# Patient Record
Sex: Male | Born: 1935
Health system: Southern US, Community
[De-identification: ages and names within clinical notes are randomized; demographics above are authoritative.]

## PROBLEM LIST (undated history)

## (undated) DIAGNOSIS — I2510 Atherosclerotic heart disease of native coronary artery without angina pectoris: Secondary | ICD-10-CM

## (undated) DIAGNOSIS — E119 Type 2 diabetes mellitus without complications: Secondary | ICD-10-CM

## (undated) DIAGNOSIS — I10 Essential (primary) hypertension: Secondary | ICD-10-CM

## (undated) DIAGNOSIS — E039 Hypothyroidism, unspecified: Secondary | ICD-10-CM

## (undated) DIAGNOSIS — Z951 Presence of aortocoronary bypass graft: Secondary | ICD-10-CM

## (undated) DIAGNOSIS — I350 Nonrheumatic aortic (valve) stenosis: Secondary | ICD-10-CM

## (undated) DIAGNOSIS — J189 Pneumonia, unspecified organism: Secondary | ICD-10-CM

## (undated) DIAGNOSIS — R011 Cardiac murmur, unspecified: Secondary | ICD-10-CM

## (undated) DIAGNOSIS — E785 Hyperlipidemia, unspecified: Secondary | ICD-10-CM

## (undated) DIAGNOSIS — Z8719 Personal history of other diseases of the digestive system: Secondary | ICD-10-CM

## (undated) HISTORY — DX: Presence of aortocoronary bypass graft: Z95.1

## (undated) HISTORY — DX: Atherosclerotic heart disease of native coronary artery without angina pectoris: I25.10

---

## 2007-12-16 ENCOUNTER — Inpatient Hospital Stay (HOSPITAL_COMMUNITY): Admission: EM | Admit: 2007-12-16 | Discharge: 2007-12-22 | Payer: Self-pay | Admitting: Emergency Medicine

## 2007-12-17 ENCOUNTER — Encounter: Payer: Self-pay | Admitting: Surgery

## 2007-12-17 ENCOUNTER — Ambulatory Visit: Payer: Self-pay | Admitting: Surgery

## 2007-12-17 DIAGNOSIS — Z951 Presence of aortocoronary bypass graft: Secondary | ICD-10-CM

## 2007-12-17 HISTORY — DX: Presence of aortocoronary bypass graft: Z95.1

## 2007-12-17 HISTORY — PX: CORONARY ARTERY BYPASS GRAFT: SHX141

## 2008-01-11 ENCOUNTER — Ambulatory Visit: Payer: Self-pay | Admitting: Surgery

## 2008-01-11 ENCOUNTER — Encounter: Admission: RE | Admit: 2008-01-11 | Discharge: 2008-01-11 | Payer: Self-pay | Admitting: Surgery

## 2008-02-03 ENCOUNTER — Encounter (HOSPITAL_COMMUNITY): Admission: RE | Admit: 2008-02-03 | Discharge: 2008-05-03 | Payer: Self-pay | Admitting: Cardiology

## 2009-08-04 IMAGING — CR DG CHEST 2V
2 series · 2 of 2 positions shown · non-contrast
Comparison: Chest x-ray of 12/20/2007

CLINICAL DATA: Status post CABG 12/17/2007

CHEST - 2 VIEW

[w chest pa]
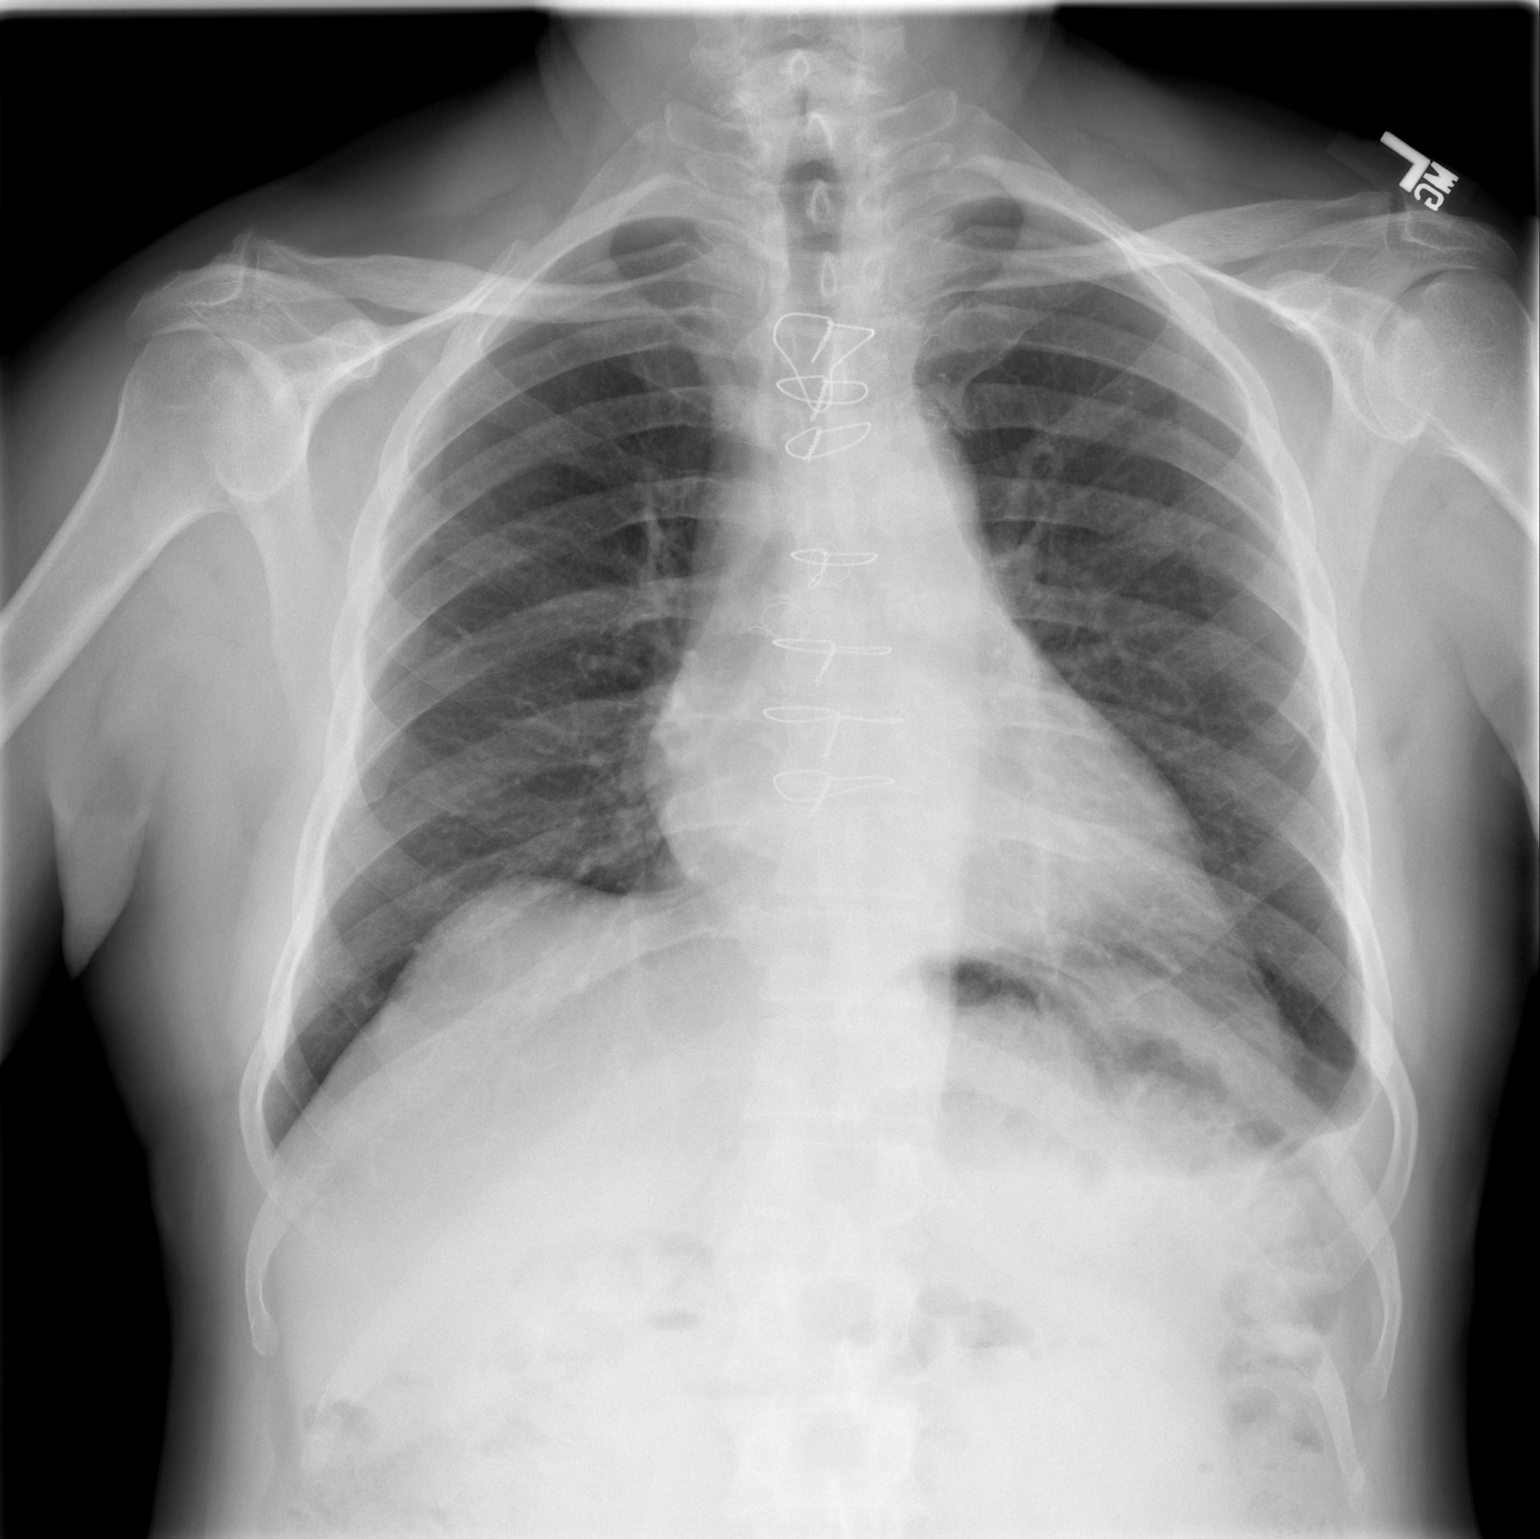

[w chest lat]
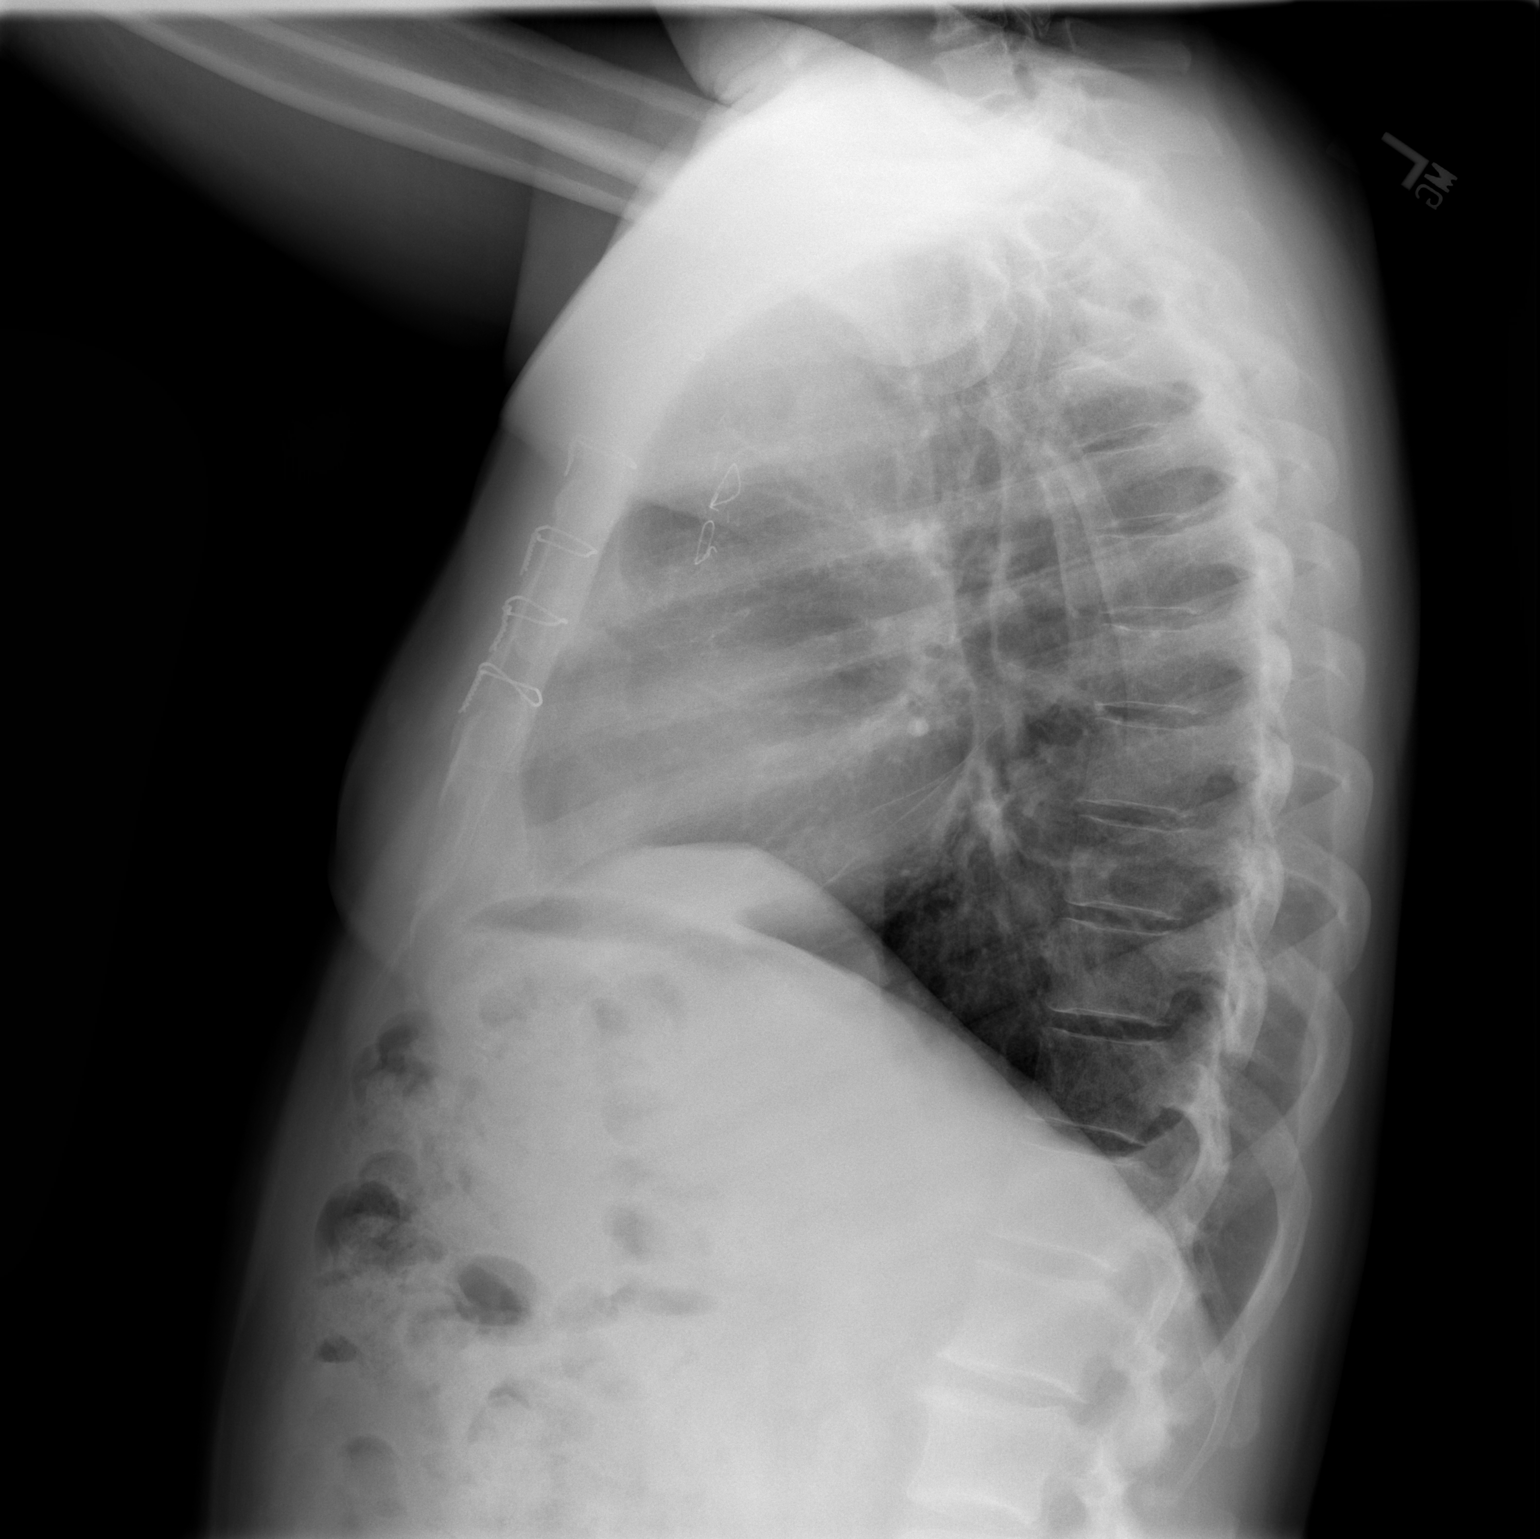

[2 of 2 positions shown; findings below may reference images not displayed]

FINDINGS: Aeration has improved significantly.  Only a tiny left
pleural effusion remains.  The heart is mildly enlarged and stable.
Median sternotomy sutures are noted.
IMPRESSION: Improved aeration with only tiny left pleural effusion remaining.

## 2010-06-11 NOTE — Cardiovascular Report (Signed)
NAME:  Dillon [name], Dillon [name] NO.:  [id number]   MEDICAL RECORD NO.:  [id number]           PATIENT TYPE:   LOCATION:                                 FACILITY:   PHYSICIAN:  Dillon [name], M.D.     DATE OF BIRTH:  08-Aug-1933   DATE OF PROCEDURE:  DATE OF DISCHARGE:                            CARDIAC CATHETERIZATION   INDICATIONS:  [name] is a 76 year old patient who has been  followed primarily by Dr. Nelson [name].  He recently has developed episodes of  exertional shortness of breath and some vague chest burning.  He  underwent a nuclear perfusion study yesterday which was high risk with  transient ST elevation inferiorly as well as ST segment depressions.  He  was admitted to [facility] by Dr. Garen [name] yesterday from the  office following his stress test.  Troponin was 0.85, CK 162, and MB 15.  Second troponin was 3.26.  The patient has been asymptomatic and had  received Lovenox last night.  He now is referred for definitive cardiac  catheterization.   PROCEDURE:  After premedication with Valium 5 mg intravenously, the  patient was prepped and draped in the usual fashion.  His right femoral  artery was punctured anteriorly and a 5-French sheath was inserted.  Diagnostic cardiac catheterization was done utilizing 5-French Judkins 4  left and right coronary catheters.  A 200 mcg IC nitroglycerin was also  administered down the right coronary artery.  The right catheter was  used for selective angiography in the left internal mammary artery  system due to need for CBG revascularization surgery.  A 5-French  pigtail catheter was used for biplane cine left ventriculography as well  as distal aortography.  Hemostasis was obtained by direct manual  pressure.  The patient tolerated the procedure well.   HEMODYNAMIC DATA:  Left central aortic pressure is 125/60, left  ventricular pressure 125/13, post A-wave 22.   ANGIOGRAPHIC DATA:  There was high-grade at least  90% ostial left main  stenosis followed by a concentric napkin ring stenosis of 60-70% in the  mid left main coronary artery prior to its bifurcation into the LAD and  left circumflex system.   The LAD gave rise to 1 major proximal diagonal vessel and septal  perforating artery and extended to and wrapped around slightly the LV  apex.   The circumflex vessel gave rise to 1 major marginal vessel which had  smooth narrowing of 20%.   There was calcification at the ostium of the right coronary artery, and  just below this level of calcification, there was 99% stenosis at the  ostium of the RCA.  After acute marginal branch, there was 70% narrowing  followed by 50% narrowing beyond the crux.   The left subclavian and internal mammary artery were normal and suitable  for CBG revascularization surgery.   Biplane cine left ventriculography revealed low normal LV function with  an ejection fraction estimate of approximately 50%.  On the RAO  projection, there appeared to be mild hypocontractility inferiorly.  Contractility was relatively normal on the  LAO projection, but there was  a subtle region of possible minimal inferolateral hypocontractility.   Distal aortography revealed normal aortoiliac system.  There was no  evidence for renal artery stenosis.   IMPRESSION:  1. Low normal LV function with mild inferolateral hypocontractility.  2. High-grade 90% ostial left main stenosis followed by concentric      napkin ring 60-70% mid left main stenosis and 99% ostial stenosis      in the right coronary artery with 70 and 50% narrowings in the      region just before and beyond the crux.   RECOMMENDATIONS:  Surgical consultation will be obtained immediately  following the catheterization.  The patient will resume anticoagulation  and continue on the anticoagulation until he scheduled for CBG  revascularization surgery.           ______________________________  Dillon [name],  M.D.     TK/MEDQ  D:  12/17/2007  T:  12/18/2007  Job:  [id number]   cc:   Dillon Mends, MD  Dr. Nelson [name]

## 2010-06-11 NOTE — Discharge Summary (Signed)
NAME:  Dillon [name], Dillon [name] NO.:  [id number]   MEDICAL RECORD NO.:  [id number]         PATIENT TYPE:  CINP   LOCATION:                               FACILITY:  [facility]   PHYSICIAN:  Evelene Croon, M.D.     DATE OF BIRTH:  01-12-1934   DATE OF ADMISSION:  12/16/2007  DATE OF DISCHARGE:                               DISCHARGE SUMMARY   FINAL DIAGNOSIS:  High-grade ostial left main and ostial right coronary  stenosis.   IN-HOSPITAL DIAGNOSES:  1. Acute blood loss anemia, postoperatively.  2. Volume overload, postoperatively.   SECONDARY DIAGNOSES:  1. Hypertension.  2. Dyslipidemia.  3. Hypothyroidism.   IN-HOSPITAL OPERATIONS AND PROCEDURES:  1. Cardiac catheterization.  2. Emergent coronary artery bypass grafting x3 using left internal      mammary artery graft to left anterior descending coronary artery,      saphenous vein graft to obtuse marginal branch of the left      circumflex coronary artery, saphenous vein graft to right coronary      artery.  Endoscopic vein harvesting from right leg.   PATIENT'S HISTORY AND PHYSICAL AND HOSPITAL COURSE:  The patient is a 77-  year-old gentleman who presents with a history of right-sided chest  tightness and progressive exertional dyspnea.  He had a markedly  abnormal stress test yesterday and was admitted and ruled in for small  non-ST-segment elevation myocardial infarction.  Cardiac catheterization  done, showed 95% ostial left main stenosis as well as some elevated  plaque in the left main coronary artery.  There was about 99% ostial  right coronary artery stenosis.  Left ventricular ejection fraction  about 50%.  Following catheterization, Dr. Laneta Simmers was consulted for  emergent coronary artery bypass grafting.  Dr. Laneta Simmers discussed with the  family and wife.  He discussed undergoing emergent coronary artery  bypass grafting.  Risks and benefits discussed.  The patient voiced  understanding and agreed to proceed.   Surgery was scheduled for a day,  December 17, 2007.   For details of the patient's past medical history and physical exam,  please see dictated H&P.   The patient was taken to the operating room on December 17, 2007, where  he underwent emergent coronary artery bypass grafting x3 using a left  internal mammary artery graft to the left anterior descending coronary  artery, saphenous vein graft to obtuse marginal branch of the left  circumflex coronary artery, saphenous vein graft to right coronary  artery.  Endoscopic vein harvesting from the right leg was done.  The  patient tolerated this procedure well and was transferred to the  intensive care unit in stable condition.  Postoperatively, the patient  was noted to be hemodynamically stable.  He was extubated the morning  following surgery.  Post extubation, the patient noted to be alert and  oriented x4 and neuro intact.  Postoperatively, the patient did have  some vasodilation.  He was placed on Levophed.  He did have some mild  acute blood loss initially, on postop day 1, 8.6 and 24.9%.  The patient  was transfused with 1 unit of packed red blood cells.  The Levophed was  able to be weaned and discontinued.  The patient's heart rate and blood  pressure remained stable.  His vasodilation improved.  The patient's  hemoglobin/hematocrit improved with a unit of blood to 9.5 and 27.2%.  On postop day 2, chest x-ray remained stable with minimal drainage from  chest tube.  The patient was up and out of bed.  Chest tubes were able  to be discontinued in normal fashion.  The patient did have some mild  volume overload postoperatively and was started on diuretics.  By postop  day 3, the patient continued to progress well.  He was felt to be stable  and ready for transfer to 2000.  The patient's hemoglobin and hematocrit  were stable at 8.8 and 25.8% on postop day 3.  Postoperatively, cardiac  rehab continued to work with the patient.  He was  up ambulating well  without difficulty.  His heart rate remained in normal sinus rhythm.  Blood pressure remained stable.  He was able to be started on low-dose  beta blocker and tolerated well.  The patient was encouraged to continue  using his incentive spirometer.  He was able to be weaned off oxygen  sating greater than 90% on room air.  The patient remained on Lasix for  his volume overload.  Daily weights were obtained.  He remained 2.2 kg  above his preoperative weight.  The patient did have some mild nausea  postoperatively with 1 episode of vomiting on postop day 3.  This  resolved.  The patient did have a bowel movement following surgery.  Prior to discharge, the patient was tolerating diet well with no nausea  or vomiting.  All incisions were noted to be clean, dry, and intact and  healing well.   Postop day 4, the patient's vital signs were stable.  He was afebrile.  Labs showed a sodium of 135, potassium 5.3, chloride of 104, bicarb of  28, BUN of 16, creatinine of 1.06, glucose of 116.  White blood cell  count 10.4, hemoglobin of 8.8, hematocrit 25.8, platelet count 137.  The  patient is felt to be stable and ready for discharge home in the a.m. on  postop day 5, December 22, 2007.   FOLLOWUP APPOINTMENTS:  A followup appointment was arranged with Dr.  Laneta Simmers for January 11, 2008, at 2:30 p.m.  The patient will need to  obtain PA and lateral chest x-ray 30 minutes prior to this appointment.  The patient will need to follow up with Dr. Tresa Endo in 2 weeks.  He will  need to contact the office to make these arrangements.   ACTIVITY:  The patient was instructed no driving until released to do so  and no lifting over 10 pounds.  He is told to ambulate 3-4 times a day,  progress as tolerated and continue his breathing exercises.   INCISIONAL CARE:  The patient is told to shower, washing his incisions  using soap and water.  He is to contact the office if he develops any   drainage or opening from any of his incision sites.   DIET:  The patient was educated on diet to be low fat, low salt.   DISCHARGE MEDICATIONS:  1. Levoxyl 112 mcg daily.  2. Crestor 10 mg daily.  3. Fexofenadine 180 mg daily.  4. Ald[place] 5% cream apply as needed.  5. Aspirin 325 mg daily.  6. Niacin  500 mg daily.  7. Vitamin D daily.  8. Toprol-XL 25 mg daily.  9. Lasix 40 mg daily x3 days.  10.Oxycodone 5 mg 1-2 tablets q.4-6 h. p.r.n.      Theda Belfast, [place]      Evelene Croon, M.D.  Electronically Signed    KMD/MEDQ  D:  12/21/2007  T:  12/21/2007  Job:  [id number]   cc:   Dr. Tresa Endo

## 2010-06-11 NOTE — Op Note (Signed)
NAME:  Dillon [name], Dillon [name] NO.:  [id number]   MEDICAL RECORD NO.:  [id number]          PATIENT TYPE:  INP   LOCATION:  2308                         FACILITY:  [facility]   PHYSICIAN:  Evelene Croon, M.D.     DATE OF BIRTH:  13-Jul-1933   DATE OF PROCEDURE:  12/17/2007  DATE OF DISCHARGE:                               OPERATIVE REPORT   PREOPERATIVE DIAGNOSES:  High-grade ostial left main and ostial right  coronary stenosis.   POSTOPERATIVE DIAGNOSES:  High-grade ostial left main and ostial right  coronary stenosis.   OPERATIVE PROCEDURE:  Emergent median sternotomy, extracorporeal  circulation, coronary bypass graft surgery x3 using a left internal  mammary artery graft to the left anterior descending coronary artery,  with a saphenous vein graft to the obtuse marginal branch of the left  circumflex coronary artery, and a saphenous vein graft to the right  coronary artery.  Endoscopic vein harvesting from the right leg.   SURGEON:  Evelene Croon, M.D.   ASSISTANT:  Coral Ceo, PA-C.   ANESTHESIA:  General endotracheal.   CLINICAL HISTORY:  This patient is a 77 year old gentleman who presented  with a history of right-sided chest tightness and progressive exertional  dyspnea.  He had a markedly abnormal stress test yesterday and was  admitted and ruled in for a small non ST-segment elevation MI.  Cardiac  catheterization was done today and showed 95% ostial left main stenosis  as well as some elevated plaque in the left main coronary artery.  There  was about 99% ostial right coronary stenosis.  The right coronary was  diffusely diseased throughout the proximal and midportions.  Left  ventricular ejection fraction about 50%.  After review of the  catheterization, I felt that it would be best to proceed with emergent  coronary bypass surgery, given his high risk anatomy.  I discussed the  operative procedure with the patient and his wife including  alternatives,  benefits, and risks including but not limited to bleeding,  blood transfusion, infection, stroke, myocardial infarction, graft  failure, and death.  He understood and agreed to proceed.   OPERATIVE PROCEDURE:  The patient was taken to the operating room and  placed on table in supine position.  After induction of general  endotracheal anesthesia, a Foley catheter was placed in bladder using  sterile technique.  Then, the chest, abdomen, and both lower extremities  were prepped and draped in usual sterile manner.  The chest is entered  through a median sternotomy incision and the pericardium was opened in  the midline.  Examination of the heart showed good ventricular  contractility.  The ascending aorta had no palpable plaques in it.   Then, the left internal mammary artery was harvested from the chest wall  using pedicle graft.  This is a medium caliber vessel with excellent  blood flow through it.  At the same time, a segment of greater saphenous  vein was harvested from the right leg using endoscopic vein harvest  technique.  The vein was of medium size and good quality.   Then, the  patient was heparinized when an adequate activated clotting  time was achieved, the distal ascending aorta was cannulated using a 20-  [place] aortic cannula for arterial inflow.  Venous outflow was achieved  using a 2-stage venous cannula to the right atrial appendage.  An  antegrade cardioplegia and vent cannula was inserted in the aortic root.   The patient placed on cardiopulmonary bypass and distal coronaries  identified.  The LAD was a large graftable vessel.  The left circumflex  had a small intermediate vessel and a large distal obtuse marginal  branch which was graftable.  The right coronary artery was heavily  diseased in its proximal and midportion, but distally beyond the acute  margin of the heart, the vessel was minimally diseased and suitable for  grafting.  Then, the aorta was cross  clamped and 1000 mL of cold blood  antegrade cardioplegia was administered in the aortic root with quick  arrest of the heart.  Systemic hypothermia to 32 degrees centigrade and  topical hypothermic with iced saline was used.  An insulating pad was  placed in the pericardium.  Temperature probe was placed in the septum.   The first distal anastomosis was performed to the obtuse marginal  branch.  The internal diameter was about 1.75 mm.  The conduit used was  a segment of greater saphenous vein.  The anastomosis performed in end-  to-side manner using continuous 7-0 Prolene suture.  The flow was noted  through the graft and was excellent.   A second distal anastomosis was performed to the distal right coronary  artery.  The internal diameter was about 2 mm.  The conduit used was a  second segment of greater saphenous vein and the anastomosis performed  in end-to-side manner using continuous 7-0 Prolene suture.  The flow was  noted through the graft and was excellent.  Another dose of cardioplegia  was given down the vein grafts and in the aortic root.   The third distal anastomosis was performed to the midportion of the left  anterior descending coronary artery.  The internal diameter was about 2  mm.  The conduit used was a left internal mammary graft and this was  brought through an opening left pericardium, anterior to phrenic nerve.  It was anastomosed to the LAD in end-to-side manner using continuous 8-0  Prolene suture.  The pedicle was sutured to the epicardium, 6-0 Prolene  sutures.  The patient rewarmed to 37 degrees centigrade.  With cross-  clamp in place, the 2 proximal vein graft anastomoses were performed to  the aortic root in end-to-side manner using continuous 6-0 Prolene  suture.  Then, the clamp was removed from mammary pedicle.  There was  rapid warming of the ventricular septum and return of spontaneous  ventricular fibrillation.  The cross-clamp was removed with a  time of 56  minutes and the patient spontaneously converted to sinus rhythm.  The  proximal and distal anastomoses appeared hemostatic and the graft was  satisfactory.  Graft mark was placed around the proximal anastomoses.  Two temporary right ventricular and right atrial pacing wires placed and  brought through the skin.   The patient rewarmed to 37 degrees centigrade, he was weaned from  cardiopulmonary bypass on no inotropic agents.  Total bypass time was 73  minutes.  Cardiac function appeared excellent.  Cardiac output of 5  liters per minute.  Protamine was given and the venous and aortic  cannulas removed without difficulty.  Hemostasis was  achieved.  Three  chest tubes were placed with the tube in the posterior pericardium, one  in the left pleural space and one in the anterior mediastinum.  The  sternum was then closed with #6 stainless steel wires.  The fascia was  closed with continuous #1 Vicryl suture.  Subcutaneous tissue was closed  with continuous 2-0 Vicryl and the skin with 3-0 Vicryl subcuticular  closure.  The lower extremity vein harvest site was closed in layers in  similar manner.  The sponge, needle and counts were correct  according to the scrub nurse.  Dry sterile dressing applied over the  incisions around the chest tubes with Pleur-evac suction.  The patient  remained hemodynamically stable and was transported to the SICU in  guarded but stable condition.     Evelene Croon, M.D.  Electronically Signed    BB/MEDQ  D:  12/17/2007  T:  12/18/2007  Job:  [id number]   cc:   Nicki Guadal[place], M.D.

## 2010-06-11 NOTE — Assessment & Plan Note (Signed)
OFFICE VISIT   Dillon [name], Dillon [name]  DOB:  1933-04-17                                        January 11, 2008  CHART #:  [id number]   The patient returned today for followup, status post emergent coronary  artery bypass graft surgery x3 on December 17, 2007, for a 95% ostial  left main stenosis and 99% ostial right coronary stenosis.  He has been  feeling well since discharge.  He is walking in his house for 20 minutes  at a time without any chest pain or shortness of breath.  He has not  been getting out much due to the weather.  He has mild chest wall  soreness, but has had no anginal chest pain.   PHYSICAL EXAMINATION:  VITAL SIGNS:  Blood pressure 135/77, pulse is 77  and regular.  Respiratory rate is 18 and unlabored.  Oxygen saturation  on room air is 99%.  GENERAL:  He looks well.  CARDIAC:  Regular rate and rhythm with normal heart sounds.  LUNG:  Clear.  CHEST:  The chest incision is healing well and sternum is stable.  EXTREMITIES:  His leg incision is healing well and there is no  significant peripheral edema.   Followup chest x-ray today shows clear lung fields and no pleural  effusions.   His medications are Cozaar 100 mg daily, fexofenadine 180 mg daily,  Levoxyl 100 mcg daily, Crestor 10 mg daily, Lopressor 25 mg daily,  aspirin 325 mg daily, niacin 500 mg daily, and vitamin D 2000 units  daily.   IMPRESSION:  Overall, the patient is recovering well following a  surgery.  I encouraged him to continue walking as much as possible.  He  is planning on starting a cardiac rehab program after the holidays.  I  told him that he could return to driving a car at this time, he should  refrain from lifting anything heavier than 10 pounds for a total of 3  months from the date of surgery.  He has  a followup appointment with Dr. Tresa Endo later this week and will contact  me if he develops any problems with his incisions.   Evelene Croon, M.D.  Electronically Signed   BB/MEDQ  D:  01/11/2008  T:  01/11/2008  Job:  [id number]   cc:   Nicki Gu[place], M.D.

## 2010-06-11 NOTE — Consult Note (Signed)
NAME:  Dillon [name], Dillon [name] NO.:  [id number]   MEDICAL RECORD NO.:  [id number]          PATIENT TYPE:  INP   LOCATION:  2308                         FACILITY:  [facility]   PHYSICIAN:  Evelene Croon, M.D.     DATE OF BIRTH:  Dec 21, 1933   DATE OF CONSULTATION:  12/17/2007  DATE OF DISCHARGE:                                 CONSULTATION   REFERRING PHYSICIAN:  Nicki Guadalajara, M.D.   REASON FOR CONSULTATION:  High-grade left main and ostial right coronary  artery stenosis.   CLINICAL HISTORY:  I was asked by Dr. Nicki Guadalajara to evaluate Dillon [name]  for consideration of coronary artery bypass graft surgery.  He is a 77-year-old gentleman with a history of hypertension, dyslipidemia, and  hypothyroidism, who has no prior cardiac history, but does report a  several-month history of right-sided chest tightness and exertional  dyspnea.  The symptoms started in September 2009 and have progressively  worsened.  He has also noticed some weight gain and abdominal  distention.  He underwent a stress test yesterday which was markedly  positive with development of chest pain and EKG changes as well as  shortness of breath.  He was admitted and ruled in for a small non-ST  segment elevation MI.  He was taken to the cath lab this morning by Dr.  Tresa Endo and this showed 90-95% ostial left main stenosis as well as a 99%  ostial right coronary artery stenosis with diffuse disease throughout  the proximal and mid right.  Left ventricular ejection fraction is about  50%.   His review of systems is as follows:  GENERAL:  He denies any fever or chills.  He has had some recent weight  gain.  Does report fatigue and generalized weakness.  EYES:  Negative.  ENT:  Negative.  ENDOCRINE:  He denies diabetes.  He does have  hypothyroidism.  CARDIOVASCULAR:  As above.  He denies PND and  orthopnea.  He denies palpitations.  He denies peripheral edema.  RESPIRATORY:  Denies cough and sputum production.  He  has had exertional  dyspnea.  GI:  He has had no nausea and vomiting.  He denies melena and  bright red blood per rectum.  GU:  He denies dysuria and hematuria.  MUSCULOSKELETAL:  He denies arthralgias and myalgias.  NEUROLOGICAL:  He  denies any focal weakness or numbness.  He denies dizziness and syncope.  He has never had a TIA or stroke.  PSYCHIATRIC:  Negative.  HEMATOLOGICAL:  Negative.   ALLERGIES:  None.   PAST MEDICAL HISTORY:  Significant for hypertension, dyslipidemia, and  hypothyroidism.  He has never been hospitalized and has had no surgery.   MEDICATIONS:  As noted in the medication administration record.   SOCIAL HISTORY:  He is married, but has no children.  He does not smoke,  but did in the past.  He denies alcohol use.   FAMILY HISTORY:  His mother died at age 70 with diabetes and kidney  disease.  His father died at age 55 with complications of myocardial  infarction.  He has 2 sisters that both have hypertension and had  strokes.   PHYSICAL EXAMINATION:  GENERAL:  He is a well-developed white male in no  distress.  VITAL SIGNS:  Blood pressure is 145/90.  Pulse is 80 and regular.  Respiratory rate is 18 and unlabored.  HEENT:  Normocephalic and atraumatic.  Pupils are equal and reactive to  light and accommodation.  Extraocular muscles are intact.  His throat is  clear.  NECK:  Normal carotid pulses bilaterally.  There are no bruits.  There  is no adenopathy or thyromegaly.  CARDIAC:  Regular rate and rhythm with normal S1 and S2.  There is no  murmur, rub, or gallop.  LUNGS:  Clear.  ABDOMINAL:  Active bowel sounds.  His abdomen is soft and nontender.  There are no palpable masses or organomegaly.  EXTREMITIES:  No peripheral edema.  Pedal pulses are palpable  bilaterally.  SKIN:  Warm and dry.  NEUROLOGIC:  Alert and oriented x3.  Motor and sensory exams grossly  normal.   IMPRESSION:  Dillon [name] has high-grade left main and ostial right  coronary  artery stenosis with a markedly abnormal stress test.  Given  this degree of high risk anatomy, I think we should proceed with  emergent coronary artery bypass surgery.  I discussed the operative  procedure with the patient and his wife including alternatives,  benefits, and risks including but not limited to bleeding, blood  transfusion, infection, stroke, myocardial infarction, graft failure,  and death.  He understands and agrees to proceed.      Evelene Croon, M.D.  Electronically Signed     BB/MEDQ  D:  12/17/2007  T:  12/18/2007  Job:  [id number]   cc:   Nicki Guadalajara, M.D.

## 2010-06-11 NOTE — H&P (Signed)
NAME:  Dillon [name], Dillon [name] NO.:  [id number]   MEDICAL RECORD NO.:  [id number]          [name] TYPE:  INP   LOCATION:  3714                         FACILITY:  [facility]   PHYSICIAN:  Sheliah Mends, MD      DATE OF BIRTH:  1933/07/26   DATE OF ADMISSION:  12/16/2007  DATE OF DISCHARGE:                              HISTORY & PHYSICAL   CHIEF COMPLAINT:  Dyspnea on exertion and chest discomfort.   HISTORY OF PRESENT ILLNESS:  Dillon [name] is a 77 year old gentleman  who presented with complaints of shortness of breath and dyspnea on  exertion on December 11, 2007, to [facility] for evaluation.  He noticed these symptoms first on a golf trip  to [place] in September 2009.  His symptoms subsequently  worsened.  In addition, he noted significant weight gain and fluid  distention in his abdomen.  Dillon [name] did not have any overt chest pain  or chest pressure.  He denied associated symptoms such as nausea and  left arm numbness.   Dillon [name] denied history of prior myocardial infarction.  He has  multiple risk factors for coronary artery disease, most notably his age,  history of dyslipidemia, and hypertension.  Dillon [name] was subsequently  scheduled for a transthoracic echocardiogram and cardiac stress test.  He underwent a treadmill myocardial perfusion scan.  During exercise, he  develops a dynamic EKG changes with ST elevation as well as ST  depressions during Dillon end of exercise into recovery.  His EKG changes  resolved subsequently.  During this episode he had a right-sided chest  discomfort and felt sudden onset of fatigue, but no overt chest pain.  Dillon results of his transthoracic echocardiogram are currently pending.  Dillon myocardial perfusion images showed evidence of an inferior scar.  Final reading of both tests are currently pending.   PAST MEDICAL HISTORY:  1. Hypertension.  2. Dyslipidemia.  3. Hypothyroidism.   MEDICATIONS:  1. Levoxyl 112 mcg p.o. daily.  2. Crestor 10 mg p.o. nightly.  3. Fexofenadine 180 mg p.o. daily.  4. Topical ointment for treatment for skin infection on top of his      head.  5. Tekturna 150 mg p.o. daily.  6. Aspirin 325 p.o. daily.  7. Niacin 500 mg p.o. daily.  8. Vitamin D supplement.   ALLERGIES:  No known drug allergies.   SOCIAL HISTORY:  Dillon [name] is retired.  He is married and has no  children.  He does exercise on a  regular basis.  He has no history of  tobacco, alcohol, and IV drug abuse.   FAMILY HISTORY:  Dillon [name]'s mother died at age 59 of complications  from diabetes and kidney disease.  Dillon [name]'s father died at age 65  of complications from myocardial infarction.  Dillon [name] has 2 sisters  and both of them have a history of hypertension and stroke.   REVIEW OF SYSTEMS:  Positive as above including fatigue, internal  weakness, dyspnea on exertion, weight gain, and occasional chest  discomfort.  Dillon [name] denies headaches, nausea, vomiting, chest pain,  and lower extremity edema.   PHYSICAL EXAMINATION:  GENERAL:  Dillon [name] is alert and oriented x3.  VITAL SIGNS:  Blood pressure 140/85, heart rate 80, weight 166 pounds.  NECK:  Supple.  Normal JVP.  No carotid bruit.  CHEST:  Lung, clear to auscultation bilaterally.  No rales or wheezes.  HEART:  Regular rate and rhythm.  No rub, murmur, or gallop.  ABDOMEN:  Soft, nontender, and nondistended.  Positive bowel sounds.  EXTREMITIES:  No edema.   LABORATORY DATA:  Total cholesterol 147, triglycerides 149, HDL 45, and  LDL 74.  Admission labs including cardiac enzymes are currently pending  and EKG from December 01, 2007, shows normal sinus rhythm at a rate of 64  beats per minute, normal axis, and no significant ST-T-segment changes.   IMPRESSION:  1. High risk myocardial perfusion scan with dynamic EKG changes      including ST elevation that are currently resolved.  2. Acute  coronary syndrome.  3. Hypertension.  4. Dyslipidemia.   PLAN:  Mr. Dwon Sky will be admitted to [facility] to [place]  telemetry unit.  He will be followed with serial EKG and cardiac  enzymes.  He will be scheduled for cardiac catheterization within 24  hours.   In Dillon meantime, Mr. Venson will be continued on Tekturna and  hydrochlorothiazide for control of his blood pressure as well as Crestor  for dyslipidemia and I will add Lovenox 1 mg/kg Platea q.12 h. to his  regimen.  Mr. Sens will be admitted as a full code.  He agreed to  proceed with coronary angiography.  I discussed Dillon nature of Dillon  procedure including benefits and complications with him.   Mr. Renault will be admitted as a full code.  Thank you for allowing me to  assist in Dillon care of this nice gentleman.      Sheliah Mends, MD  Electronically Signed     JE/MEDQ  D:  12/16/2007  T:  12/17/2007  Job:  [id number]

## 2010-10-30 LAB — CBC
HCT: 24.9 — ABNORMAL LOW
HCT: 25.8 — ABNORMAL LOW
HCT: 29.3 — ABNORMAL LOW
HCT: 33.1 — ABNORMAL LOW
HCT: 40.9
HCT: 41
Hemoglobin: 10.1 — ABNORMAL LOW
Hemoglobin: 11.1 — ABNORMAL LOW
Hemoglobin: 13.8
Hemoglobin: 8.8 — ABNORMAL LOW
Hemoglobin: 9.5 — ABNORMAL LOW
MCHC: 34.2
MCHC: 34.4
MCHC: 34.8
MCV: 94.6
MCV: 94.8
MCV: 96.6
MCV: 97
Platelets: 134 — ABNORMAL LOW
Platelets: 137 — ABNORMAL LOW
RBC: 2.58 — ABNORMAL LOW
RBC: 2.91 — ABNORMAL LOW
RBC: 3.09 — ABNORMAL LOW
RBC: 3.41 — ABNORMAL LOW
RBC: 4.22
RBC: 4.27
RDW: 12.3
RDW: 14.3
WBC: 10.4
WBC: 10.7 — ABNORMAL HIGH
WBC: 14.7 — ABNORMAL HIGH
WBC: 7.7

## 2010-10-30 LAB — POCT I-STAT 3, ART BLOOD GAS (G3+)
Bicarbonate: 22.1
Bicarbonate: 23.8
Bicarbonate: 25.2 — ABNORMAL HIGH
Bicarbonate: 25.6 — ABNORMAL HIGH
Patient temperature: 37.9
TCO2: 23
TCO2: 25
TCO2: 26
pCO2 arterial: 32.8 — ABNORMAL LOW
pCO2 arterial: 39.7
pCO2 arterial: 42.7
pH, Arterial: 7.34 — ABNORMAL LOW
pH, Arterial: 7.417
pH, Arterial: 7.464 — ABNORMAL HIGH
pO2, Arterial: 92

## 2010-10-30 LAB — CARDIAC PANEL(CRET KIN+CKTOT+MB+TROPI)
CK, MB: 16.8 — ABNORMAL HIGH
Relative Index: 8.9 — ABNORMAL HIGH
Total CK: 158
Total CK: 189
Troponin I: 3.26

## 2010-10-30 LAB — BASIC METABOLIC PANEL
BUN: 10
BUN: 15
BUN: 16
BUN: 21
CO2: 23
CO2: 25
CO2: 25
CO2: 28
Calcium: 7.9 — ABNORMAL LOW
Calcium: 8 — ABNORMAL LOW
Calcium: 8.5
Chloride: 100
Chloride: 104
Chloride: 107
Chloride: 107
Chloride: 93 — ABNORMAL LOW
Creatinine, Ser: 0.88
Creatinine, Ser: 1.06
GFR calc Af Amer: >60
GFR calc non Af Amer: 58 — ABNORMAL LOW
GFR calc non Af Amer: >60
GFR calc non Af Amer: >60
Glucose, Bld: 116 — ABNORMAL HIGH
Glucose, Bld: 126 — ABNORMAL HIGH
Glucose, Bld: 88
Potassium: 3.5
Potassium: 4.4
Sodium: 130 — ABNORMAL LOW
Sodium: 132 — ABNORMAL LOW
Sodium: 136

## 2010-10-30 LAB — POCT I-STAT 4, (NA,K, GLUC, HGB,HCT)
Glucose, Bld: 102 — ABNORMAL HIGH
Glucose, Bld: 88
Glucose, Bld: 90
Glucose, Bld: 92
Glucose, Bld: 99
HCT: 20 — ABNORMAL LOW
HCT: 23 — ABNORMAL LOW
HCT: 24 — ABNORMAL LOW
HCT: 33 — ABNORMAL LOW
HCT: 36 — ABNORMAL LOW
Hemoglobin: 11.6 — ABNORMAL LOW
Hemoglobin: 8.2 — ABNORMAL LOW
Potassium: 4.2
Potassium: 6.8
Potassium: 7
Sodium: 126 — ABNORMAL LOW
Sodium: 127 — ABNORMAL LOW
Sodium: 132 — ABNORMAL LOW
Sodium: 132 — ABNORMAL LOW

## 2010-10-30 LAB — CK TOTAL AND CKMB (NOT AT ARMC): Total CK: 163

## 2010-10-30 LAB — GLUCOSE, CAPILLARY
Glucose-Capillary: 116 — ABNORMAL HIGH
Glucose-Capillary: 125 — ABNORMAL HIGH
Glucose-Capillary: 126 — ABNORMAL HIGH
Glucose-Capillary: 142 — ABNORMAL HIGH
Glucose-Capillary: 144 — ABNORMAL HIGH
Glucose-Capillary: 86
Glucose-Capillary: 89

## 2010-10-30 LAB — POCT I-STAT, CHEM 8
BUN: 12
BUN: 12
Calcium, Ion: 1.15
Chloride: 98
Creatinine, Ser: 1.2
Glucose, Bld: 111 — ABNORMAL HIGH
Hemoglobin: 13.9
Hemoglobin: 9.5 — ABNORMAL LOW
Potassium: 4
Potassium: 4.2
Sodium: 138

## 2010-10-30 LAB — TYPE AND SCREEN: ABO/RH(D): O POS

## 2010-10-30 LAB — TROPONIN I: Troponin I: 0.85

## 2010-10-30 LAB — PROTIME-INR: Prothrombin Time: 13.5

## 2010-10-30 LAB — POCT I-STAT 3, VENOUS BLOOD GAS (G3P V)
Acid-base deficit: 2
Bicarbonate: 24.6 — ABNORMAL HIGH
O2 Saturation: 87
TCO2: 26
pH, Ven: 7.326 — ABNORMAL HIGH

## 2010-10-30 LAB — APTT: aPTT: 26

## 2010-10-30 LAB — HEMOGLOBIN AND HEMATOCRIT, BLOOD: HCT: 25.3 — ABNORMAL LOW

## 2011-12-11 ENCOUNTER — Other Ambulatory Visit (HOSPITAL_COMMUNITY): Payer: Self-pay | Admitting: Cardiovascular Disease

## 2011-12-11 DIAGNOSIS — I6529 Occlusion and stenosis of unspecified carotid artery: Secondary | ICD-10-CM

## 2011-12-18 ENCOUNTER — Ambulatory Visit (HOSPITAL_COMMUNITY)
Admission: RE | Admit: 2011-12-18 | Discharge: 2011-12-18 | Disposition: A | Discharge status: Home / Self Care | Payer: Medicare Other | Source: Ambulatory Visit | Attending: Cardiovascular Disease | Admitting: Cardiovascular Disease

## 2011-12-18 DIAGNOSIS — I6529 Occlusion and stenosis of unspecified carotid artery: Secondary | ICD-10-CM | POA: Insufficient documentation

## 2011-12-18 NOTE — Progress Notes (Signed)
Carotid duplex completed. 12/18/2011 Elvert Cumpton, Cletis [place]

## 2012-04-02 ENCOUNTER — Encounter: Payer: Self-pay | Admitting: *Deleted

## 2012-04-23 ENCOUNTER — Encounter: Payer: Self-pay | Admitting: Cardiovascular Disease

## 2012-07-05 ENCOUNTER — Ambulatory Visit (INDEPENDENT_AMBULATORY_CARE_PROVIDER_SITE_OTHER): Payer: Medicare Other | Admitting: Cardiovascular Disease

## 2012-07-05 ENCOUNTER — Encounter: Payer: Self-pay | Admitting: Cardiovascular Disease

## 2012-07-05 VITALS — BP 126/80 | HR 58 | Ht 65.0 in | Wt 169.4 lb

## 2012-07-05 DIAGNOSIS — I2510 Atherosclerotic heart disease of native coronary artery without angina pectoris: Secondary | ICD-10-CM

## 2012-07-05 DIAGNOSIS — E782 Mixed hyperlipidemia: Secondary | ICD-10-CM

## 2012-07-05 DIAGNOSIS — E039 Hypothyroidism, unspecified: Secondary | ICD-10-CM

## 2012-07-05 DIAGNOSIS — I119 Hypertensive heart disease without heart failure: Secondary | ICD-10-CM

## 2012-07-05 DIAGNOSIS — Z79899 Other long term (current) drug therapy: Secondary | ICD-10-CM

## 2012-07-05 DIAGNOSIS — E785 Hyperlipidemia, unspecified: Secondary | ICD-10-CM

## 2012-07-05 DIAGNOSIS — Z951 Presence of aortocoronary bypass graft: Secondary | ICD-10-CM

## 2012-07-05 NOTE — Patient Instructions (Addendum)
Your physician recommends that you return for lab work in a few weeks. You will need to be fasting for these labs.  CBC, CMP, TSH, NMR  Your physician recommends that you schedule a follow-up appointment in December.  Your physician has requested that you have a carotid duplex in November. This test is an ultrasound of the carotid arteries in your neck. It looks at blood flow through these arteries that supply the brain with blood. Allow one hour for this exam. There are no restrictions or special instructions.

## 2012-07-05 NOTE — Progress Notes (Signed)
Patient ID: Dillon Dillon [name], male   DOB: 1933/08/22, 79 y.o.   MRN: [id number]     HPI: [place] [name], is a 79 y.o. male who presents to the office today for cardiology evaluation. He November 2009 Dillon Dillon [name] underwent emergent CABG revascularization surgery after cardiac catheterization revealed severe life-threatening per Dillon Dillon [name] anatomy with 95% ostial left main stenosis a 99% ostial RCA stenosis. Surgery was done by Dr. Laneta [name] and he had a LIMA to the LAD, vein to the obtuse marginal, vein to the RCA. His last nuclear perfusion study in April 2012 continued to show normal perfusion.  Dillon Dillon [name] does have documented carotid disease. His last carotid Doppler study was done in November 2013 which showed at least 60% stenosis in his carotid arteries bilaterally which was slightly increased from previously.  Additional problems include mixed hyperlipidemia. In the past he had derived marked benefit with Dillon Dillon [name] as well as Dillon [place] [name]. He tells me since I last saw him he had reduced the Dillon [name] to 1 [name]. He has not had his laboratory checked since the he has a history of hypertension which has been fairly well controlled with amlodipine and losartan and Toprol-XL.  As the Dillon [name] has remained active. He specifically denies recent chest tightness. Denies wheezing. He states his blood pressure has been fairly well controlled.  Past Medical History  Diagnosis Date  . CAD (coronary artery disease)   . S/P CABG x 3 12/17/07    LIMA to LAD,SVG to left C    Past Surgical History  Procedure Laterality Date  . Coronary artery bypass graft  12/17/07    LIMA to LAD,vein to obtuse marginal,vein to RCA    No Known Allergies  Current Outpatient Prescriptions  Medication Sig Dispense Refill  . amLODipine (NORVASC) 5 [name] tablet Take 5 [name] by mouth daily.      Marland Kitchen aspirin 325 [name] tablet Take 325 [name] by mouth daily.      . cholecalciferol (VITAMIN D) 1000 UNITS tablet Take 2,000 Units by mouth daily.      . fexofenadine  (ALLEGRA) 180 [name] tablet Take 180 [name] by mouth daily.      . Folic Acid-Vit B6-Vit B12 (FOLBEE) 2.5-25-1 [name] TABS Take 1 tablet by mouth daily.      Marland Kitchen levothyroxine (SYNTHROID, LEVOTHROID) 112 MCG tablet Take 112 mcg by mouth daily.      Marland Kitchen losartan (COZAAR) 100 [name] tablet Take 100 [name] by mouth daily.      . metoprolol succinate (TOPROL-XL) 50 [name] 24 hr tablet Take 50 [name] by mouth daily. Take with or immediately following a meal.      . niacin (Dillon [name]) 1000 [name] CR tablet Take 1,000 [name] by mouth at bedtime.       . rosuvastatin (Dillon) 40 [name] tablet Take 40 [name] by mouth daily.       No current facility-administered medications for this visit.    Socially he is married. There are no children. He does remain active. He does walk. There is no tobacco use. He does drink occasional alcohol.  ROS is negative for fever chills or night sweats. He denies presyncope or syncope. There is no PND or orthopnea. He denies chest pressure. He denies bleeding. He denies hematochezia or melena. He denies myalgias. There are no claudication symptoms. He denies rash. Other system review is negative.  PE BP 126/80  Pulse 58  Ht 5\' 5"  (1.651 m)  Wt 169 lb 6.4 oz (76.839 kg)  BMI  28.19 kg/m2  General: Alert, oriented, no distress.  HEENT: Normocephalic, atraumatic. Pupils round and reactive; sclera anicteric;no lid lag,  Nose without nasal septal hypertrophy Mouth/Parynx benign; Mallinpatti scale 2/3 Neck: No JVD, saw carotid briuts Lungs: clear to ausculatation and percussion; no wheezing or rales Heart: RRR, s1 s2 normal 1/6 systolic murmur in the aortic area and left sternal border.  Abdomen: soft, nontender; no hepatosplenomehaly, BS+; abdominal aorta nontender and not dilated by palpation. Pulses 2+ Extremities: no clubbing cyanosis or edema, Homan's sign negative  Neurologic: grossly nonfocal  ECG: Normal sinus rhythm at 58 beats per minute with mild sinus arrhythmia.  LABS:  BMET    Component Value  Date/Time   NA 130* 12/22/2007 0336   K 4.4 12/22/2007 0336   CL 99 12/22/2007 0336   CO2 25 12/22/2007 0336   GLUCOSE 88 12/22/2007 0336   BUN 15 12/22/2007 0336   CREATININE 1.00 12/22/2007 0336   CALCIUM 7.9* 12/22/2007 0336   GFRNONAA >60 12/22/2007 0336   GFRAA  Value: >60        The eGFR has been calculated using the MDRD equation. This calculation has not been validated in all clinical 12/22/2007 0336     Hepatic Function Panel  No results found for this basename: prot, albumin, ast, alt, alkphos, bilitot, bilidir, ibili     CBC    Component Value Date/Time   WBC 10.4 12/20/2007 0510   RBC 2.73* 12/20/2007 0510   HGB 8.8* 12/20/2007 0510   HCT 25.8* 12/20/2007 0510   PLT 137* 12/20/2007 0510   MCV 94.6 12/20/2007 0510   MCHC 34.2 12/20/2007 0510   RDW 14.3 12/20/2007 0510     BNP No results found for this basename: probnp    Lipid Panel  No results found for this basename: chol, trig, hdl, cholhdl, vldl, ldlcalc       ASSESSMENT AND PLAN:  Dillon Dillon [name] continues to do fairly well. He is now almost 5 years status post emergent CABG revascularization surgery for life-threatening coronary anatomy. His last stress test was in April 2012. His last carotid Doppler studies for his carotid stenoses were in November 2013. Apparently Dr. Ivory [name] had suggested he discontinue the Dillon [name]. He did not do this but reduce this to 1 [name] instead. I discussed with Dillon Dillon [name] with positive data with reference to Dillon [name] along with statin therapy and reviewed the recent trials which were negative but in these trial patients were already at goal essentially before treatment was started with may have contributed to the negative mortality reduction.  I did discuss with him numerous additional studies showing favorable benefit particularly with plaque regression in reduction and carotid intimal media thickness. Since he is on a reduced dose of Dillon [name] I am suggesting laboratory be checked in the  fasting state. I will check an NMR lipid profile progressive evaluation. I have recommended that in November he undergo a one-year followup carotid duplex scan. His blood pressure is currently well controlled on losartan 100 [name] as well as amlodipine 5 [name] and Toprol-XL 50 [name]. I will contact him regarding his laboratory if adjustments need to be made in his medical regimen. I will see him in December 2014 for followup evaluation.     Lennette Bihari, MD, [name]  07/05/2012 9:56 AM

## 2012-08-31 LAB — NMR LIPOPROFILE WITH LIPIDS
Cholesterol, Total: 139 mg/dL
HDL Particle Number: 38.5 umol/L
HDL Size: 10.1 nm
HDL-C: 68 mg/dL
LDL (calc): 55 mg/dL
LDL Particle Number: 498 nmol/L
LDL Size: 21.2 nm
LP-IR Score: 26
Large HDL-P: 12.8 umol/L
Large VLDL-P: 3.1 nmol/L — ABNORMAL HIGH
Small LDL Particle Number: <90 nmol/L
Triglycerides: 82 mg/dL
VLDL Size: 51.1 nm — ABNORMAL HIGH

## 2012-08-31 LAB — COMPREHENSIVE METABOLIC PANEL
AST: 21 U/L (ref 0–37)
Alkaline Phosphatase: 66 U/L (ref 39–117)
BUN: 14 mg/dL (ref 6–23)
Glucose, Bld: 118 mg/dL — ABNORMAL HIGH (ref 70–99)
Potassium: 4.8 mEq/L (ref 3.5–5.3)
Sodium: 137 mEq/L (ref 135–145)
Total Bilirubin: 0.4 mg/dL (ref 0.3–1.2)

## 2012-08-31 LAB — CBC
HCT: 43.5 % (ref 39.0–52.0)
Hemoglobin: 14.5 g/dL (ref 13.0–17.0)
MCH: 32.6 pg (ref 26.0–34.0)
MCHC: 33.3 g/dL (ref 30.0–36.0)
RDW: 13.6 % (ref 11.5–15.5)

## 2012-09-01 ENCOUNTER — Other Ambulatory Visit: Payer: Self-pay

## 2012-09-13 NOTE — Progress Notes (Signed)
Quick Note:    Released to mychart.

## 2012-10-01 ENCOUNTER — Other Ambulatory Visit: Payer: Self-pay | Admitting: *Deleted

## 2012-10-01 MED ORDER — FOLIC ACID 1 MG PO TABS: 1.0000 mg | ORAL_TABLET | Freq: Every day | ORAL | Status: DC

## 2012-10-01 NOTE — Telephone Encounter (Signed)
**  Walk-In**   Pt in office and left message to send refills for refill on HCTZ. Letter attached.   Returned call. Left message to call back before 4pm or Monday before 4pm. Letter reviewed and is a notification that no remaining refills on Rx. Instructs pt to send letter back to [facility] to contact physician for refill.

## 2012-12-02 ENCOUNTER — Other Ambulatory Visit: Payer: Self-pay

## 2012-12-10 ENCOUNTER — Ambulatory Visit (HOSPITAL_COMMUNITY)
Admission: RE | Admit: 2012-12-10 | Discharge: 2012-12-10 | Disposition: A | Discharge status: Home / Self Care | Payer: Medicare Other | Source: Ambulatory Visit | Attending: Cardiovascular Disease | Admitting: Cardiovascular Disease

## 2012-12-10 DIAGNOSIS — Z951 Presence of aortocoronary bypass graft: Secondary | ICD-10-CM

## 2012-12-10 DIAGNOSIS — I6529 Occlusion and stenosis of unspecified carotid artery: Secondary | ICD-10-CM | POA: Insufficient documentation

## 2012-12-10 DIAGNOSIS — I2510 Atherosclerotic heart disease of native coronary artery without angina pectoris: Secondary | ICD-10-CM | POA: Insufficient documentation

## 2012-12-10 NOTE — Progress Notes (Signed)
Carotid Duplex Completed. 

## 2012-12-30 ENCOUNTER — Other Ambulatory Visit: Payer: Self-pay | Admitting: *Deleted

## 2012-12-30 MED ORDER — LOSARTAN POTASSIUM 100 MG PO TABS: 100.0000 mg | ORAL_TABLET | Freq: Every day | ORAL | Status: DC

## 2012-12-30 MED ORDER — ROSUVASTATIN CALCIUM 40 MG PO TABS: 40.0000 mg | ORAL_TABLET | Freq: Every day | ORAL | Status: DC

## 2012-12-30 NOTE — Telephone Encounter (Signed)
Refilled losartan and crestor to [facility].

## 2013-01-06 ENCOUNTER — Encounter: Payer: Self-pay | Admitting: Cardiovascular Disease

## 2013-01-06 ENCOUNTER — Ambulatory Visit (INDEPENDENT_AMBULATORY_CARE_PROVIDER_SITE_OTHER): Payer: Medicare Other | Admitting: Cardiovascular Disease

## 2013-01-06 VITALS — BP 122/78 | HR 54 | Ht 66.0 in | Wt 168.4 lb

## 2013-01-06 DIAGNOSIS — E039 Hypothyroidism, unspecified: Secondary | ICD-10-CM

## 2013-01-06 DIAGNOSIS — I10 Essential (primary) hypertension: Secondary | ICD-10-CM | POA: Insufficient documentation

## 2013-01-06 DIAGNOSIS — E782 Mixed hyperlipidemia: Secondary | ICD-10-CM

## 2013-01-06 DIAGNOSIS — I779 Disorder of arteries and arterioles, unspecified: Secondary | ICD-10-CM | POA: Insufficient documentation

## 2013-01-06 DIAGNOSIS — I2510 Atherosclerotic heart disease of native coronary artery without angina pectoris: Secondary | ICD-10-CM

## 2013-01-06 NOTE — Progress Notes (Signed)
Patient ID: Dillon [name], male   DOB: March 17, 1933, 79 y.o.   MRN: [id number]     HPI: Dillon [name], is a 79 y.o. male who presents to the office today for a 6 month or cardiology evaluation.  In  November 2009 Dillon [name] underwent emergent CABG revascularization surgery after cardiac catheterization revealed severe life-threatening anatomy with 95% ostial left main stenosis a 99% ostial RCA stenosis. Surgery was done by Dr. Laneta Simmers and he had a LIMA to the LAD, vein to the obtuse marginal, vein to the RCA. His last nuclear perfusion study in April 2012 continued to show normal perfusion.  Dillon [name] does have documented carotid disease. His last carotid Doppler study was done in November 2013 which showed at least 60% stenosis in his carotid arteries bilaterally which was slightly increased from previously. He recently had a one year follow carotid evaluation which now showed his peak right internal carotid systolic velocity 257 slightly increased from 240 in his left PICA systolic velocity up to 11 slightly increased from 200. This places him in the 50-69% diameter reduction range bilaterally and was not significantly changed from one year ago.  Additional problems include mixed hyperlipidemia. In the past he had derived marked benefit with Niaspan 2 g as well as [name]. He tells me since I last saw him he had reduced the Niaspan to 1 g. He has a history of hypertension which has been fairly well controlled with amlodipine and losartan and Toprol-XL.  As the [place] has remained active. He specifically denies recent chest tightness. Denies wheezing. He states his blood pressure has been fairly well controlled.  Laboratory and August revealed excellent lipid status with LDL particle #498, calculated LDL 55, HDL cholesterol 68, triglycerides 82, total cholesterol 139. Insulin resistance was excellent at 26. He had normal renal function with a BUN of 14 cranial 1.0  Past Medical History  Diagnosis Date    . CAD (coronary artery disease)   . S/P CABG x 3 12/17/07    LIMA to LAD,SVG to left C    Past Surgical History  Procedure Laterality Date  . Coronary artery bypass graft  12/17/07    LIMA to LAD,vein to obtuse marginal,vein to RCA    No Known Allergies  Current Outpatient Prescriptions  Medication Sig Dispense Refill  . amLODipine (NORVASC) 5 MG tablet Take 5 mg by mouth daily.      Marland Kitchen aspirin 325 MG tablet Take 325 mg by mouth daily.      . cholecalciferol (VITAMIN D) 1000 UNITS tablet Take 2,000 Units by mouth daily.      . fexofenadine (ALLEGRA) 180 MG tablet Take 180 mg by mouth daily.      . folic acid (FOLVITE) 1 MG tablet Take 1 tablet (1 mg total) by mouth daily.  90 tablet  2  . Folic Acid-Vit B6-Vit B12 (FOLBEE) 2.5-25-1 MG TABS Take 1 tablet by mouth daily.      Marland Kitchen levothyroxine (SYNTHROID, LEVOTHROID) 112 MCG tablet Take 112 mcg by mouth daily.      Marland Kitchen losartan (COZAAR) 100 MG tablet Take 1 tablet (100 mg total) by mouth daily.  90 tablet  3  . metoprolol succinate (TOPROL-XL) 50 MG 24 hr tablet Take 50 mg by mouth daily. Take with or immediately following a meal.      . mupirocin ointment (BACTROBAN) 2 % Apply 1 application topically daily.      . niacin (NIASPAN) 1000 MG CR tablet Take 1,000 mg  by mouth at bedtime.       . rosuvastatin (CRESTOR) 40 MG tablet Take 1 tablet (40 mg total) by mouth daily.  90 tablet  3   No current facility-administered medications for this visit.    Socially he is married. There are no children. He does remain active. He does walk. There is no tobacco use. He does drink occasional alcohol.  ROS is negative for fever chills or night sweats. He denies rash or flushing episodes. He denies visual changes. There is no change in hearing. There is no lymphadenopathy. He denies presyncope or syncope. There is no PND or orthopnea. He denies chest pressure. He denies bleeding. He denies nausea vomiting or diarrhea the He denies hematochezia or  melena. He denies myalgias. There are no claudication symptoms. There is no history of overt diabetes. He denies cold or heat intolerance on his current dose of Synthroid for hypothyroidism.   Other comprehensive 12 point system review is negative.  PE BP 122/78  Pulse 54  Ht 5\' 6"  (1.676 m)  Wt 168 lb 6.4 oz (76.386 kg)  BMI 27.19 kg/m2  General: Alert, oriented, no distress.  HEENT: Normocephalic, atraumatic. Pupils round and reactive; sclera anicteric;no lid lag,  Nose without nasal septal hypertrophy Mouth/Parynx benign; Mallinpatti scale 2/3 Neck: No JVD, saw carotid briuts Lungs: clear to ausculatation and percussion; no wheezing or rales Heart: RRR, s1 s2 normal 1/6 systolic murmur in the aortic area and left sternal border.  Abdomen: soft, nontender; no hepatosplenomehaly, BS+; abdominal aorta nontender and not dilated by palpation. Pulses 2+ Extremities: no clubbing cyanosis or edema, Homan's sign negative  Neurologic: grossly nonfocal Psychological: Normal affect and mood. Normal cognitive function  ECG: Normal sinus rhythm at 54 beats per minute; normal intervals.  LABS:  BMET    Component Value Date/Time   NA 137 08/30/2012 0845   K 4.8 08/30/2012 0845   CL 101 08/30/2012 0845   CO2 27 08/30/2012 0845   GLUCOSE 118* 08/30/2012 0845   BUN 14 08/30/2012 0845   CREATININE 1.00 08/30/2012 0845   CREATININE 1.00 12/22/2007 0336   CALCIUM 9.5 08/30/2012 0845   GFRNONAA >60 12/22/2007 0336   GFRAA  Value: >60        The eGFR has been calculated using the MDRD equation. This calculation has not been validated in all clinical 12/22/2007 0336     Hepatic Function Panel     Component Value Date/Time   PROT 7.2 08/30/2012 0845     CBC    Component Value Date/Time   WBC 7.0 08/30/2012 0845   RBC 4.45 08/30/2012 0845   HGB 14.5 08/30/2012 0845   HCT 43.5 08/30/2012 0845   PLT 176 08/30/2012 0845   MCV 97.8 08/30/2012 0845   MCH 32.6 08/30/2012 0845   MCHC 33.3 08/30/2012 0845   RDW 13.6  08/30/2012 0845     BNP No results found for this basename: probnp    Lipid Panel  No results found for this basename: chol,  trig,  hdl,  cholhdl,  vldl,  ldlcalc       ASSESSMENT AND PLAN:   Dillon [name] continues to do fairly well. He is now almost 5 years status post emergent CABG revascularization surgery for life-threatening coronary anatomy. His last stress test was in April 2012. His most recent carotid Doppler studies for his carotid stenoses are not significantly changed from November 2013. Dillon [name] blood pressure is well controlled on his current therapy consisting of amlodipine  5 mg losartan 100 mg Toprol XL 50 mg. His lipids are excellent with combination Niaspan 1000 mg and [name]. He is tolerating this well. He does have hypothyroidism on Synthroid replacement with normal TSH. Clinically he continues to do exceptionally well. I have suggested that he can reduce his aspirin from 325 mg to 81 mg daily and enteric-coated formulation  We did discuss additional weight reduction since he has gained approximately 4 pounds since his last office visit. We discussed increased activity. As long as he remains stable I will see him in 6 months the chronologic reevaluation.   Lennette Bihari, MD, [facility]  01/06/2013 9:18 AM

## 2013-01-06 NOTE — Patient Instructions (Signed)
Your physician recommends that you schedule a follow-up appointment in: 6 months. No changes were made today. 

## 2013-02-28 ENCOUNTER — Other Ambulatory Visit: Payer: Self-pay | Admitting: *Deleted

## 2013-02-28 MED ORDER — METOPROLOL SUCCINATE ER 50 MG PO TB24: 50.0000 mg | ORAL_TABLET | Freq: Every day | ORAL | Status: DC

## 2013-03-07 ENCOUNTER — Other Ambulatory Visit: Payer: Self-pay

## 2013-03-07 MED ORDER — AMLODIPINE BESYLATE 5 MG PO TABS: 5.0000 mg | ORAL_TABLET | Freq: Every day | ORAL | Status: DC

## 2013-03-07 NOTE — Telephone Encounter (Signed)
Rx was sent to pharmacy electronically. 

## 2013-05-24 ENCOUNTER — Telehealth (HOSPITAL_COMMUNITY): Payer: Self-pay | Admitting: *Deleted

## 2013-05-25 ENCOUNTER — Other Ambulatory Visit (HOSPITAL_COMMUNITY): Payer: Self-pay | Admitting: Cardiovascular Disease

## 2013-05-25 DIAGNOSIS — R0989 Other specified symptoms and signs involving the circulatory and respiratory systems: Secondary | ICD-10-CM

## 2013-05-30 ENCOUNTER — Ambulatory Visit (HOSPITAL_COMMUNITY)
Admission: RE | Admit: 2013-05-30 | Discharge: 2013-05-30 | Disposition: A | Discharge status: Home / Self Care | Payer: Medicare Other | Source: Ambulatory Visit | Attending: Cardiovascular Disease | Admitting: Cardiovascular Disease

## 2013-05-30 DIAGNOSIS — I6529 Occlusion and stenosis of unspecified carotid artery: Secondary | ICD-10-CM

## 2013-05-30 DIAGNOSIS — I779 Disorder of arteries and arterioles, unspecified: Secondary | ICD-10-CM

## 2013-05-30 DIAGNOSIS — I739 Peripheral vascular disease, unspecified: Secondary | ICD-10-CM

## 2013-05-30 DIAGNOSIS — R0989 Other specified symptoms and signs involving the circulatory and respiratory systems: Secondary | ICD-10-CM

## 2013-05-30 NOTE — Progress Notes (Signed)
Carotid Duplex Completed. Callahan Wild, BS, RDMS, RVT  

## 2013-07-25 ENCOUNTER — Other Ambulatory Visit: Payer: Self-pay | Admitting: *Deleted

## 2013-07-25 MED ORDER — NIACIN ER (ANTIHYPERLIPIDEMIC) 1000 MG PO TBCR: 1000.0000 mg | EXTENDED_RELEASE_TABLET | Freq: Every day | ORAL | Status: DC

## 2013-08-10 ENCOUNTER — Other Ambulatory Visit: Payer: Self-pay | Admitting: *Deleted

## 2013-08-10 MED ORDER — FOLIC ACID 1 MG PO TABS: 1.0000 mg | ORAL_TABLET | Freq: Every day | ORAL | Status: DC

## 2013-11-22 ENCOUNTER — Telehealth (HOSPITAL_COMMUNITY): Payer: Self-pay | Admitting: *Deleted

## 2013-11-23 ENCOUNTER — Other Ambulatory Visit (HOSPITAL_COMMUNITY): Payer: Self-pay | Admitting: Cardiovascular Disease

## 2013-11-23 DIAGNOSIS — R0989 Other specified symptoms and signs involving the circulatory and respiratory systems: Secondary | ICD-10-CM

## 2013-12-01 ENCOUNTER — Ambulatory Visit (HOSPITAL_COMMUNITY)
Admission: RE | Admit: 2013-12-01 | Discharge: 2013-12-01 | Disposition: A | Discharge status: Home / Self Care | Payer: Medicare Other | Source: Ambulatory Visit | Attending: Cardiology | Admitting: Cardiology

## 2013-12-01 ENCOUNTER — Telehealth: Payer: Self-pay | Admitting: *Deleted

## 2013-12-01 DIAGNOSIS — R0989 Other specified symptoms and signs involving the circulatory and respiratory systems: Secondary | ICD-10-CM | POA: Insufficient documentation

## 2013-12-01 DIAGNOSIS — E785 Hyperlipidemia, unspecified: Secondary | ICD-10-CM

## 2013-12-01 DIAGNOSIS — E0590 Thyrotoxicosis, unspecified without thyrotoxic crisis or storm: Secondary | ICD-10-CM

## 2013-12-01 DIAGNOSIS — E038 Other specified hypothyroidism: Secondary | ICD-10-CM

## 2013-12-01 DIAGNOSIS — Z79899 Other long term (current) drug therapy: Secondary | ICD-10-CM

## 2013-12-01 MED ORDER — ROSUVASTATIN CALCIUM 40 MG PO TABS: 40.0000 mg | ORAL_TABLET | Freq: Every day | ORAL | Status: DC

## 2013-12-01 NOTE — Progress Notes (Signed)
Carotid Duplex Completed. °[name],RVT °

## 2013-12-01 NOTE — Telephone Encounter (Signed)
Late entry patient in office earlier today  need medication refilled E sent crestor 40 mg #90 x 3  to [facility]  Spoke to patient - appt schedule for 01/30/14 and lab order tsh , lipid, cmp, cbc

## 2014-01-19 LAB — CBC
HCT: 42.7 % (ref 39.0–52.0)
HEMOGLOBIN: 14.5 g/dL (ref 13.0–17.0)
MCH: 33.1 pg (ref 26.0–34.0)
MCHC: 34 g/dL (ref 30.0–36.0)
MCV: 97.5 fL (ref 78.0–100.0)
Platelets: 180 10*3/uL (ref 150–400)
RBC: 4.38 MIL/uL (ref 4.22–5.81)
RDW: 13.4 % (ref 11.5–15.5)
WBC: 10.5 10*3/uL (ref 4.0–10.5)

## 2014-01-19 LAB — COMPREHENSIVE METABOLIC PANEL
ALK PHOS: 57 U/L (ref 39–117)
ALT: 12 U/L (ref 0–53)
AST: 15 U/L (ref 0–37)
Albumin: 4.2 g/dL (ref 3.5–5.2)
BILIRUBIN TOTAL: 0.4 mg/dL (ref 0.2–1.2)
BUN: 19 mg/dL (ref 6–23)
CO2: 30 mEq/L (ref 19–32)
Calcium: 9.5 mg/dL (ref 8.4–10.5)
Chloride: 95 mEq/L — ABNORMAL LOW (ref 96–112)
Creat: 1.1 mg/dL (ref 0.50–1.35)
GLUCOSE: 111 mg/dL — AB (ref 70–99)
Potassium: 4.9 mEq/L (ref 3.5–5.3)
SODIUM: 131 meq/L — AB (ref 135–145)
Total Protein: 7 g/dL (ref 6.0–8.3)

## 2014-01-19 LAB — LIPID PANEL
Cholesterol: 145 mg/dL (ref 0–200)
HDL: 64 mg/dL (ref >39)
LDL CALC: 67 mg/dL (ref 0–99)
Total CHOL/HDL Ratio: 2.3 Ratio
Triglycerides: 70 mg/dL (ref <150)
VLDL: 14 mg/dL (ref 0–40)

## 2014-01-19 LAB — TSH: TSH: 4.513 u[IU]/mL — ABNORMAL HIGH (ref 0.350–4.500)

## 2014-01-30 ENCOUNTER — Encounter: Payer: Self-pay | Admitting: Cardiovascular Disease

## 2014-01-30 ENCOUNTER — Ambulatory Visit (INDEPENDENT_AMBULATORY_CARE_PROVIDER_SITE_OTHER): Payer: Medicare Other | Admitting: Cardiovascular Disease

## 2014-01-30 VITALS — BP 121/60 | HR 48 | Ht 66.0 in | Wt 169.9 lb

## 2014-01-30 DIAGNOSIS — E039 Hypothyroidism, unspecified: Secondary | ICD-10-CM

## 2014-01-30 DIAGNOSIS — I739 Peripheral vascular disease, unspecified: Secondary | ICD-10-CM

## 2014-01-30 DIAGNOSIS — I2510 Atherosclerotic heart disease of native coronary artery without angina pectoris: Secondary | ICD-10-CM

## 2014-01-30 DIAGNOSIS — E782 Mixed hyperlipidemia: Secondary | ICD-10-CM

## 2014-01-30 DIAGNOSIS — I10 Essential (primary) hypertension: Secondary | ICD-10-CM

## 2014-01-30 DIAGNOSIS — I779 Disorder of arteries and arterioles, unspecified: Secondary | ICD-10-CM

## 2014-01-30 DIAGNOSIS — I119 Hypertensive heart disease without heart failure: Secondary | ICD-10-CM

## 2014-01-30 MED ORDER — METOPROLOL SUCCINATE ER 50 MG PO TB24: 50.0000 mg | ORAL_TABLET | Freq: Every day | ORAL | Status: DC

## 2014-01-30 MED ORDER — AMLODIPINE BESYLATE 5 MG PO TABS: 5.0000 mg | ORAL_TABLET | Freq: Every day | ORAL | Status: DC

## 2014-01-30 NOTE — Progress Notes (Signed)
Patient ID: Dillon [name], male   DOB: 1933/12/14, 81 y.o.   MRN: [id number]     HPI: Dillon [name], is a 81 y.o. male who presents to the office today for a one-year follow-up cardiology evaluation.  In November 2009 Dillon [name] underwent emergent CABG revascularization surgery after cardiac catheterization revealed severe life-threatening anatomy with 95% ostial left main stenosis a 99% ostial RCA stenosis. Surgery was done by Dr. Cyndia Bent and he had a LIMA to the LAD, vein to the obtuse marginal, vein to the RCA. His last nuclear perfusion study in April 2012 continued to show normal perfusion.  Dillon [name] has documented carotid disease. A carotid Doppler study  in November 2013 which showed at least 60% stenosis in his carotid arteries bilaterally which was slightly increased from previously. A f/u carotid evaluation last year demonstrated his peak right internal carotid systolic velocity 259 slightly increased from 240 in his left PICA systolic velocity up to 11 slightly increased from 200. This places him in the 50-69% diameter reduction range bilaterally and was not significantly changed from one year ago.  On 12/01/2013.  He recently underwent another follow-up evaluation.  Peak PICA velocity was now 563 with diastolic velocity at 42 and the right carotid and 218 and 61 in the left carotid.  He remains asymptomatic and these place him in the upper end of scale in the 50-69% range  Additional problems include mixed hyperlipidemia and hypertension.  He has been on Toprol-XL 50 mg and losartan 100 mg in addition to amlodipine 5 mg for blood pressure control.. In the past he had derived marked benefit with Niaspan  as well as [name]. he recently had follow-up laboratory on his current dose of [name] and niacin 1000 mg.  This revealed a total cholesterol 145, triglycerides 70, HDL 64, and LDL 67.  His glucose was 111.  TSH 4.5.  He had normal renal function with a BUN of 19 and creatinine of 1.1.   Dillon [name] has remained active. He specifically denies recent chest tightness. Denies wheezing. He states his blood pressure has been fairly well controlled.   Past Medical History  Diagnosis Date  . CAD (coronary artery disease)   . S/P CABG x 3 12/17/07    LIMA to LAD,SVG to left C    Past Surgical History  Procedure Laterality Date  . Coronary artery bypass graft  12/17/07    LIMA to LAD,vein to obtuse marginal,vein to RCA    No Known Allergies  Current Outpatient Prescriptions  Medication Sig Dispense Refill  . amLODipine (NORVASC) 5 MG tablet Take 1 tablet (5 mg total) by mouth daily. 90 tablet 3  . aspirin EC 81 MG tablet Take 81 mg by mouth daily.    . cholecalciferol (VITAMIN D) 1000 UNITS tablet Take 2,000 Units by mouth daily.    . fexofenadine (ALLEGRA) 180 MG tablet Take 180 mg by mouth daily.    . folic acid (FOLVITE) 1 MG tablet Take 1 tablet (1 mg total) by mouth daily. 90 tablet 3  . levothyroxine (SYNTHROID, LEVOTHROID) 112 MCG tablet Take 112 mcg by mouth daily.    Marland Kitchen losartan (COZAAR) 100 MG tablet Take 1 tablet (100 mg total) by mouth daily. 90 tablet 3  . metoprolol succinate (TOPROL-XL) 50 MG 24 hr tablet Take 1 tablet (50 mg total) by mouth daily. Take with or immediately following a meal. 90 tablet 3  . mupirocin ointment (BACTROBAN) 2 % Apply 1 application topically  daily.    . niacin (NIASPAN) 1000 MG CR tablet Take 1 tablet (1,000 mg total) by mouth at bedtime. 90 tablet 2  . rosuvastatin (CRESTOR) 40 MG tablet Take 1 tablet (40 mg total) by mouth daily. 90 tablet 0   No current facility-administered medications for this visit.    Socially he is married. There are no children. He does remain active. He does walk. There is no tobacco use. He does drink occasional alcohol.  ROS General: Negative; No fevers, chills, or night sweats;  HEENT: Negative; No changes in vision or hearing, sinus congestion, difficulty swallowing Pulmonary: Negative; No cough,  wheezing, shortness of breath, hemoptysis Cardiovascular: Negative; No chest pain, presyncope, syncope, palpitations GI: Negative; No nausea, vomiting, diarrhea, or abdominal pain GU: Negative; No dysuria, hematuria, or difficulty voiding Musculoskeletal: Negative; no myalgias, joint pain, or weakness Hematologic/Oncology: Negative; no easy bruising, bleeding Endocrine: Positive for hypothyroidism on Synthroid replacement. Neuro: Negative; no changes in balance, headaches Skin: Negative; No rashes or skin lesions Psychiatric: Negative; No behavioral problems, depression Sleep: Negative; No snoring, daytime sleepiness, hypersomnolence, bruxism, restless legs, hypnogognic hallucinations, no cataplexy Other comprehensive 14 point system review is negative.   PE BP 121/60 mmHg  Pulse 48  Ht 5\' 6"  (1.676 m)  Wt 169 lb 14.4 oz (77.066 kg)  BMI 27.44 kg/m2  General: Alert, oriented, no distress.  HEENT: Normocephalic, atraumatic. Pupils round and reactive; sclera anicteric;no lid lag,  Nose without nasal septal hypertrophy Mouth/Parynx benign; Mallinpatti scale 2/3 Neck: No JVD, bilateral carotid bruits with normal upstroke Chest wall: Nontender to palpation Lungs: clear to ausculatation and percussion; no wheezing or rales Heart: RRR, s1 s2 normal 2/6 systolic murmur in the aortic area and left sternal border.  No diastolic murmur.  No rubs thrills or heaves  Abdomen: soft, nontender; no hepatosplenomehaly, BS+; abdominal aorta nontender and not dilated by palpation. Back: No CVA tenderness Pulses 2+ Extremities: no clubbing cyanosis or edema, Homan's sign negative  Neurologic: grossly nonfocal Psychological: Normal affect and mood. Normal cognitive function  ECG (independently read by me): Sinus bradycardia with mild sinus arrhythmia, heart rate ranging from 48-58.  Prior December 2014 ECG: Normal sinus rhythm at 54 beats per minute; normal intervals.  LABS:  BMET   BMP Latest  Ref Rng 01/18/2014 08/30/2012 12/22/2007  Glucose 70 - 99 mg/dL 111(H) 118(H) 88  BUN 6 - 23 mg/dL 19 14 15   Creatinine 0.50 - 1.35 mg/dL 1.10 1.00 1.00  Sodium 135 - 145 mEq/L 131(L) 137 130(L)  Potassium 3.5 - 5.3 mEq/L 4.9 4.8 4.4  Chloride 96 - 112 mEq/L 95(L) 101 99  CO2 19 - 32 mEq/L 30 27 25   Calcium 8.4 - 10.5 mg/dL 9.5 9.5 7.9(L)    Hepatic Function Panel     Component Value Date/Time   PROT 7.0 01/18/2014 0803     CBC    Component Value Date/Time   WBC 10.5 01/18/2014 0803   RBC 4.38 01/18/2014 0803   HGB 14.5 01/18/2014 0803   HCT 42.7 01/18/2014 0803   PLT 180 01/18/2014 0803   MCV 97.5 01/18/2014 0803   MCH 33.1 01/18/2014 0803   MCHC 34.0 01/18/2014 0803   RDW 13.4 01/18/2014 0803     BNP No results found for: PROBNP   Lipid Panel     Component Value Date/Time   CHOL 145 01/18/2014 0803   TRIG 70 01/18/2014 0803   TRIG 82 08/30/2012 0845   HDL 64 01/18/2014 0803   CHOLHDL 2.3  01/18/2014 0803   VLDL 14 01/18/2014 0803   LDLCALC 67 01/18/2014 0803   LDLCALC 55 08/30/2012 0845       ASSESSMENT AND PLAN:  Dillon [name] is a 81 year old gentleman who is 6 years status post emergent CABG revascularization surgery for life-threatening coronary anatomy. His last stress test was in April 2012.  In his last echo Doppler study was in 2013. His most recent carotid Doppler studies for his carotid stenoses are not significantly changed from previously.  His blood pressure today is controlled on Toprol-XL 50 mg, losartan 100 mg, and amlodipine 5 mg.  I reviewed his recent laboratory which was done on [name] and niacin 1000 mg.  I have suggested he reduce his niacin dose to 500 mg in the future may be possible to discontinue this altogether.  He is tolerating aspirin without bleeding.  He is on levothyroxine 112 g for his hypothyroidism.  7.  Aortic stenosis murmur on exam.  I reviewed his most recent carotid Doppler studies.  I will see him in 6 months for  follow up evaluation and prior to that office visit.  I am scheduling him for a follow-up echo Doppler study as well as a [employer].  I will see him in the office in 6 months for reevaluation or sooner if problems arise.   Dillon Sine, MD, [facility]  01/30/2014 2:13 PM

## 2014-01-30 NOTE — Patient Instructions (Signed)
Your physician has requested that you have an echocardiogram. Echocardiography is a painless test that uses sound waves to create images of your heart. It provides your doctor with information about the size and shape of your heart and how well your heart's chambers and valves are working. This procedure takes approximately one hour. There are no restrictions for this procedure.  Your physician has requested that you have a lexiscan myoview. For further information please visit [email or web address]. Please follow instruction sheet, as given. These studies will be done in may 2016. Follow up appointment with Dr. Claiborne Billings in June 2016.

## 2014-03-23 ENCOUNTER — Other Ambulatory Visit: Payer: Self-pay | Admitting: *Deleted

## 2014-03-23 MED ORDER — NIACIN ER (ANTIHYPERLIPIDEMIC) 1000 MG PO TBCR: 1000.0000 mg | EXTENDED_RELEASE_TABLET | Freq: Every day | ORAL | Status: DC

## 2014-03-23 MED ORDER — LOSARTAN POTASSIUM 100 MG PO TABS: 100.0000 mg | ORAL_TABLET | Freq: Every day | ORAL | Status: DC

## 2014-05-12 ENCOUNTER — Other Ambulatory Visit: Payer: Self-pay | Admitting: Cardiovascular Disease

## 2014-05-14 ENCOUNTER — Other Ambulatory Visit: Payer: Self-pay | Admitting: Cardiovascular Disease

## 2014-05-15 NOTE — Telephone Encounter (Signed)
Rx has been sent to the pharmacy electronically. ° °

## 2014-05-25 ENCOUNTER — Telehealth (HOSPITAL_COMMUNITY): Payer: Self-pay | Admitting: *Deleted

## 2014-05-29 ENCOUNTER — Other Ambulatory Visit: Payer: Self-pay | Admitting: Cardiovascular Disease

## 2014-05-29 DIAGNOSIS — R0989 Other specified symptoms and signs involving the circulatory and respiratory systems: Secondary | ICD-10-CM

## 2014-06-02 ENCOUNTER — Telehealth (HOSPITAL_COMMUNITY): Payer: Self-pay

## 2014-06-02 NOTE — Telephone Encounter (Signed)
Encounter complete. 

## 2014-06-06 ENCOUNTER — Ambulatory Visit (HOSPITAL_COMMUNITY): Payer: Medicare Other

## 2014-06-06 ENCOUNTER — Ambulatory Visit (HOSPITAL_BASED_OUTPATIENT_CLINIC_OR_DEPARTMENT_OTHER)
Admission: RE | Admit: 2014-06-06 | Discharge: 2014-06-06 | Disposition: A | Discharge status: Home / Self Care | Payer: Medicare Other | Source: Ambulatory Visit | Attending: Cardiovascular Disease | Admitting: Cardiovascular Disease

## 2014-06-06 ENCOUNTER — Encounter (HOSPITAL_COMMUNITY): Payer: Medicare Other

## 2014-06-06 ENCOUNTER — Ambulatory Visit (HOSPITAL_COMMUNITY)
Admission: RE | Admit: 2014-06-06 | Discharge: 2014-06-06 | Disposition: A | Discharge status: Home / Self Care | Payer: Medicare Other | Source: Ambulatory Visit | Attending: Cardiovascular Disease | Admitting: Cardiovascular Disease

## 2014-06-06 ENCOUNTER — Ambulatory Visit (HOSPITAL_BASED_OUTPATIENT_CLINIC_OR_DEPARTMENT_OTHER)
Admission: RE | Admit: 2014-06-06 | Discharge: 2014-06-06 | Disposition: A | Discharge status: Home / Self Care | Payer: Medicare Other | Source: Ambulatory Visit | Attending: Cardiology | Admitting: Cardiology

## 2014-06-06 DIAGNOSIS — I10 Essential (primary) hypertension: Secondary | ICD-10-CM | POA: Diagnosis not present

## 2014-06-06 DIAGNOSIS — Z951 Presence of aortocoronary bypass graft: Secondary | ICD-10-CM | POA: Diagnosis not present

## 2014-06-06 DIAGNOSIS — I2510 Atherosclerotic heart disease of native coronary artery without angina pectoris: Secondary | ICD-10-CM | POA: Diagnosis not present

## 2014-06-06 DIAGNOSIS — E782 Mixed hyperlipidemia: Secondary | ICD-10-CM | POA: Insufficient documentation

## 2014-06-06 DIAGNOSIS — R0989 Other specified symptoms and signs involving the circulatory and respiratory systems: Secondary | ICD-10-CM | POA: Diagnosis not present

## 2014-06-06 DIAGNOSIS — I081 Rheumatic disorders of both mitral and tricuspid valves: Secondary | ICD-10-CM | POA: Diagnosis not present

## 2014-06-06 LAB — MYOCARDIAL PERFUSION IMAGING
LVDIAVOL: 84 mL
LVSYSVOL: 35 mL
Nuc Stress EF: 59 %
Peak HR: 73 {beats}/min
Rest HR: 49 {beats}/min
SDS: 0
SRS: 0
SSS: 0
TID: 1.18

## 2014-06-06 MED ORDER — REGADENOSON 0.4 MG/5ML IV SOLN
0.4000 mg | Freq: Once | INTRAVENOUS | Status: AC
  Administered 2014-06-06: 0.4 mg via INTRAVENOUS

## 2014-06-06 MED ORDER — TECHNETIUM TC 99M SESTAMIBI GENERIC - CARDIOLITE
10.4000 | Freq: Once | INTRAVENOUS | Status: AC | PRN
  Administered 2014-06-06: 10 via INTRAVENOUS

## 2014-06-06 MED ORDER — TECHNETIUM TC 99M SESTAMIBI GENERIC - CARDIOLITE
30.4000 | Freq: Once | INTRAVENOUS | Status: AC | PRN
  Administered 2014-06-06: 30 via INTRAVENOUS

## 2014-06-08 ENCOUNTER — Other Ambulatory Visit (HOSPITAL_COMMUNITY): Payer: Self-pay | Admitting: Cardiovascular Disease

## 2014-07-07 ENCOUNTER — Other Ambulatory Visit: Payer: Self-pay | Admitting: *Deleted

## 2014-07-07 MED ORDER — FOLIC ACID 1 MG PO TABS: 1.0000 mg | ORAL_TABLET | Freq: Every day | ORAL | Status: DC

## 2014-07-07 NOTE — Telephone Encounter (Signed)
Requested drug refill for Folic acid has been approved and sent to [facility].

## 2014-08-11 ENCOUNTER — Ambulatory Visit (INDEPENDENT_AMBULATORY_CARE_PROVIDER_SITE_OTHER): Payer: Medicare Other | Admitting: Cardiovascular Disease

## 2014-08-11 ENCOUNTER — Encounter: Payer: Self-pay | Admitting: Cardiovascular Disease

## 2014-08-11 VITALS — BP 130/60 | HR 60 | Ht 66.0 in | Wt 171.0 lb

## 2014-08-11 DIAGNOSIS — I25810 Atherosclerosis of coronary artery bypass graft(s) without angina pectoris: Secondary | ICD-10-CM | POA: Diagnosis not present

## 2014-08-11 DIAGNOSIS — I10 Essential (primary) hypertension: Secondary | ICD-10-CM | POA: Diagnosis not present

## 2014-08-11 DIAGNOSIS — E785 Hyperlipidemia, unspecified: Secondary | ICD-10-CM

## 2014-08-11 DIAGNOSIS — E0590 Thyrotoxicosis, unspecified without thyrotoxic crisis or storm: Secondary | ICD-10-CM | POA: Diagnosis not present

## 2014-08-11 DIAGNOSIS — I779 Disorder of arteries and arterioles, unspecified: Secondary | ICD-10-CM

## 2014-08-11 DIAGNOSIS — I119 Hypertensive heart disease without heart failure: Secondary | ICD-10-CM

## 2014-08-11 DIAGNOSIS — I739 Peripheral vascular disease, unspecified: Secondary | ICD-10-CM

## 2014-08-11 DIAGNOSIS — E038 Other specified hypothyroidism: Secondary | ICD-10-CM

## 2014-08-11 DIAGNOSIS — Z79899 Other long term (current) drug therapy: Secondary | ICD-10-CM

## 2014-08-11 DIAGNOSIS — E782 Mixed hyperlipidemia: Secondary | ICD-10-CM

## 2014-08-11 NOTE — Progress Notes (Signed)
Patient ID: Dillon [name], male   DOB: 23-Jul-1933, 81 y.o.   MRN: [id number]     HPI: Dillon [name], is a 81 y.o. male who presents to the office today for a 6 month follow-up cardiology evaluation.  In November 2009 Dillon [name] underwent emergent CABG revascularization surgery after cardiac catheterization revealed severe life-threatening anatomy with 95% ostial left main stenosis a 99% ostial RCA stenosis. Surgery was done by Dr. Cyndia Bent and he had a LIMA to the LAD, vein to the obtuse marginal, vein to the RCA. His last nuclear perfusion study in April 2012 continued to show normal perfusion.  Dillon [name] has documented carotid disease. A carotid Doppler study  in November 2013 which showed at least 60% stenosis in his carotid arteries bilaterally which was slightly increased from previously. A f/u carotid evaluation last year demonstrated his peak right internal carotid systolic velocity 893 slightly increased from 240 in his left PICA systolic velocity up to 11 slightly increased from 200. This places him in the 50-69% diameter reduction range bilaterally and was not significantly changed from one year ago.  On 12/01/2013.  He recently underwent another follow-up evaluation.  Peak PICA velocity was now 810 with diastolic velocity at 42 and the right carotid and 218 and 61 in the left carotid.  He remains asymptomatic and these place him in the upper end of scale in the 50-69% range  Additional problems include mixed hyperlipidemia and hypertension.  He has been on Toprol-XL 50 mg and losartan 100 mg in addition to amlodipine 5 mg for blood pressure control.. In the past he had derived marked benefit with Niaspan  as well as [name]. he recently had follow-up laboratory on his current dose of [name] and niacin 1000 mg.  This revealed a total cholesterol 145, triglycerides 70, HDL 64, and LDL 67.  His glucose was 111.  TSH 4.5.  He had normal renal function with a BUN of 19 and creatinine of  1.1.  Since I last saw him in January 2016, he underwent echo Doppler study on 06/06/2014.  This showed an ejection fraction at 55-60%.  There was a small systolic gradient across his aortic valve with moderately calcified leaflets.  Valve area was 1.6 cm.  He had a mean gradient of 11 and a peak gradient of 23 mm suggestive of mild aortic stenosis.  PA pressure was 31 mm.  He underwent a nuclear perfusion study which remained normal with an ejection fraction of 59% and evidence for normal perfusion.  He denies chest pain, PND orthopnea.  He denies presyncope or syncope.  He presents for evaluation.  Past Medical History  Diagnosis Date  . CAD (coronary artery disease)   . S/P CABG x 3 12/17/07    LIMA to LAD,SVG to left C    Past Surgical History  Procedure Laterality Date  . Coronary artery bypass graft  12/17/07    LIMA to LAD,vein to obtuse marginal,vein to RCA    No Known Allergies  Current Outpatient Prescriptions  Medication Sig Dispense Refill  . amLODipine (NORVASC) 5 MG tablet Take 1 tablet (5 mg total) by mouth daily. 90 tablet 3  . aspirin EC 81 MG tablet Take 81 mg by mouth daily.    . cholecalciferol (VITAMIN D) 1000 UNITS tablet Take 2,000 Units by mouth daily.    . [name] tablet TAKE 1 TABLET EVERY DAY 90 tablet 2  . fexofenadine (ALLEGRA) 180 MG tablet Take 180 mg  by mouth daily.    . folic acid (FOLVITE) 1 MG tablet Take 1 tablet (1 mg total) by mouth daily. 90 tablet 3  . levothyroxine (SYNTHROID, LEVOTHROID) 112 MCG tablet Take 112 mcg by mouth daily.    Marland Kitchen losartan (COZAAR) 100 MG tablet Take 1 tablet (100 mg total) by mouth daily. 90 tablet 3  . metoprolol succinate (TOPROL-XL) 50 MG 24 hr tablet Take 1 tablet (50 mg total) by mouth daily. Take with or immediately following a meal. 90 tablet 3  . mupirocin ointment (BACTROBAN) 2 % Apply 1 application topically daily.    . niacin (NIASPAN) 1000 MG CR tablet Take 1 tablet (1,000 mg total) by mouth at  bedtime. 90 tablet 3   No current facility-administered medications for this visit.    Socially he is married. There are no children. He does remain active. He does walk. There is no tobacco use. He does drink occasional alcohol.  ROS General: Negative; No fevers, chills, or night sweats;  HEENT: Negative; No changes in vision or hearing, sinus congestion, difficulty swallowing Pulmonary: Negative; No cough, wheezing, shortness of breath, hemoptysis Cardiovascular: Negative; No chest pain, presyncope, syncope, palpitations GI: Negative; No nausea, vomiting, diarrhea, or abdominal pain GU: Negative; No dysuria, hematuria, or difficulty voiding Musculoskeletal: Negative; no myalgias, joint pain, or weakness Hematologic/Oncology: Negative; no easy bruising, bleeding Endocrine: Positive for hypothyroidism on Synthroid replacement. Neuro: Negative; no changes in balance, headaches Skin: Negative; No rashes or skin lesions Psychiatric: Negative; No behavioral problems, depression Sleep: Negative; No snoring, daytime sleepiness, hypersomnolence, bruxism, restless legs, hypnogognic hallucinations, no cataplexy Other comprehensive 14 point system review is negative.   PE BP 130/60 mmHg  Pulse 60  Ht $R'5\' 6"'JN$  (1.676 m)  Wt 171 lb (77.565 kg)  BMI 27.61 kg/m2  General: Alert, oriented, no distress.  HEENT: Normocephalic, atraumatic. Pupils round and reactive; sclera anicteric;no lid lag,  Nose without nasal septal hypertrophy Mouth/Parynx benign; Mallinpatti scale 2/3 Neck: No JVD, bilateral carotid bruits with normal upstroke Chest wall: Nontender to palpation Lungs: clear to ausculatation and percussion; no wheezing or rales Heart: RRR, s1 s2 normal 2/6 systolic murmur in the aortic area and left sternal border.  No diastolic murmur.  No rubs thrills or heaves  Abdomen: soft, nontender; no hepatosplenomehaly, BS+; abdominal aorta nontender and not dilated by palpation. Back: No CVA  tenderness Pulses 2+ Extremities: no clubbing cyanosis or edema, Homan's sign negative  Neurologic: grossly nonfocal Psychological: Normal affect and mood. Normal cognitive function  ECG (independently read by me): Sinus bradycardia with mild sinus arrhythmia, heart rate ranging from 48-58.  Prior December 2014 ECG: Normal sinus rhythm at 54 beats per minute; normal intervals.  LABS:  BMET   BMP Latest Ref Rng 01/18/2014 08/30/2012 12/22/2007  Glucose 70 - 99 mg/dL 111(H) 118(H) 88  BUN 6 - 23 mg/dL $Remove'19 14 15  'gxBDnYB$ Creatinine 0.50 - 1.35 mg/dL 1.10 1.00 1.00  Sodium 135 - 145 mEq/L 131(L) 137 130(L)  Potassium 3.5 - 5.3 mEq/L 4.9 4.8 4.4  Chloride 96 - 112 mEq/L 95(L) 101 99  CO2 19 - 32 mEq/L $Remove'30 27 25  'AgUXfKp$ Calcium 8.4 - 10.5 mg/dL 9.5 9.5 7.9(L)   Hepatic Function Latest Ref Rng 01/18/2014 08/30/2012  Total Protein 6.0 - 8.3 g/dL 7.0 7.2  Albumin 3.5 - 5.2 g/dL 4.2 4.3  AST 0 - 37 U/L 15 21  ALT 0 - 53 U/L 12 17  Alk Phosphatase 39 - 117 U/L 57 66  Total Bilirubin  0.2 - 1.2 mg/dL 0.4 0.4   CBC Latest Ref Rng 01/18/2014 08/30/2012 12/20/2007  WBC 4.0 - 10.5 K/uL 10.5 7.0 10.4  Hemoglobin 13.0 - 17.0 g/dL 14.5 14.5 8.8(L)  Hematocrit 39.0 - 52.0 % 42.7 43.5 25.8(L)  Platelets 150 - 400 K/uL 180 176 137(L)   Lab Results  Component Value Date   MCV 97.5 01/18/2014   MCV 97.8 08/30/2012   MCV 94.6 12/20/2007   Lab Results  Component Value Date   TSH 4.513* 01/18/2014  No results found for: HGBA1C  Lipid Panel     Component Value Date/Time   CHOL 145 01/18/2014 0803   CHOL 139 08/30/2012 0845   TRIG 70 01/18/2014 0803   TRIG 82 08/30/2012 0845   HDL 64 01/18/2014 0803   HDL 68 08/30/2012 0845   CHOLHDL 2.3 01/18/2014 0803   VLDL 14 01/18/2014 0803   LDLCALC 67 01/18/2014 0803   LDLCALC 55 08/30/2012 0845     ASSESSMENT AND PLAN:  DillonChandler is a 81 year old gentleman who is 7 years status post emergent CABG revascularization surgery for life-threatening coronary anatomy.  His blood pressure today is stable on current therapy consisting of losartan 100 mg, Toprol-XL 50 mg and amlodipine 5 mg.  Has a history of mixed hyperlipidemia and is on combination therapy with [name], niacin. Most recent lipid studies are very good on current therapy. He has hypothyroidism on Synthroid 112 g.  TSH was borderline normal at 4 on 12/31/2013.  He has a murmur suggestive of aortic valve disease on physical examination.  His echo Doppler study revealed a mean gradient of 11 and 23 mm peak gradient with a valve area of approximate 1.6 cm compatible with mild AS.  He is not having any anginal symptoms. I reviewed his recent nuclear study with him in detail, which continue to suggest normal perfusion. He continues to be active. BMI is 27.61 kg/m.  He will continue current therapy as prescribed.  I will see him in 6 months for reevaluation.  Troy Sine, MD, [facility]  08/11/2014 3:12 PM

## 2014-08-11 NOTE — Patient Instructions (Signed)
Your physician recommends that you schedule a follow-up appointment and blood work in 6 months. Lab orders will be sent to you closer to your appointment.

## 2015-01-02 ENCOUNTER — Other Ambulatory Visit: Payer: Self-pay | Admitting: Cardiovascular Disease

## 2015-01-02 NOTE — Telephone Encounter (Signed)
Rx request sent to pharmacy.  

## 2015-01-11 ENCOUNTER — Other Ambulatory Visit: Payer: Self-pay | Admitting: *Deleted

## 2015-01-11 ENCOUNTER — Encounter: Payer: Self-pay | Admitting: *Deleted

## 2015-01-11 DIAGNOSIS — Z79899 Other long term (current) drug therapy: Secondary | ICD-10-CM

## 2015-01-11 DIAGNOSIS — I25810 Atherosclerosis of coronary artery bypass graft(s) without angina pectoris: Secondary | ICD-10-CM

## 2015-01-11 DIAGNOSIS — E0590 Thyrotoxicosis, unspecified without thyrotoxic crisis or storm: Secondary | ICD-10-CM

## 2015-01-11 DIAGNOSIS — I10 Essential (primary) hypertension: Secondary | ICD-10-CM

## 2015-01-11 DIAGNOSIS — E785 Hyperlipidemia, unspecified: Secondary | ICD-10-CM

## 2015-03-08 ENCOUNTER — Other Ambulatory Visit: Payer: Self-pay | Admitting: *Deleted

## 2015-03-08 ENCOUNTER — Ambulatory Visit (INDEPENDENT_AMBULATORY_CARE_PROVIDER_SITE_OTHER): Payer: Medicare Other | Admitting: Cardiovascular Disease

## 2015-03-08 ENCOUNTER — Encounter: Payer: Self-pay | Admitting: Cardiovascular Disease

## 2015-03-08 VITALS — BP 148/72 | HR 58 | Ht 65.0 in | Wt 172.0 lb

## 2015-03-08 DIAGNOSIS — I25810 Atherosclerosis of coronary artery bypass graft(s) without angina pectoris: Secondary | ICD-10-CM

## 2015-03-08 DIAGNOSIS — I779 Disorder of arteries and arterioles, unspecified: Secondary | ICD-10-CM

## 2015-03-08 DIAGNOSIS — E782 Mixed hyperlipidemia: Secondary | ICD-10-CM | POA: Diagnosis not present

## 2015-03-08 DIAGNOSIS — I739 Peripheral vascular disease, unspecified: Principal | ICD-10-CM

## 2015-03-08 DIAGNOSIS — I119 Hypertensive heart disease without heart failure: Secondary | ICD-10-CM

## 2015-03-08 DIAGNOSIS — I10 Essential (primary) hypertension: Secondary | ICD-10-CM

## 2015-03-08 MED ORDER — METOPROLOL SUCCINATE ER 50 MG PO TB24: ORAL_TABLET | ORAL | Status: DC

## 2015-03-08 MED ORDER — LOSARTAN POTASSIUM 100 MG PO TABS: 100.0000 mg | ORAL_TABLET | Freq: Every day | ORAL | Status: DC

## 2015-03-08 MED ORDER — AMLODIPINE BESYLATE 5 MG PO TABS: 5.0000 mg | ORAL_TABLET | Freq: Every day | ORAL | Status: DC

## 2015-03-08 MED ORDER — ROSUVASTATIN CALCIUM 40 MG PO TABS: 40.0000 mg | ORAL_TABLET | Freq: Every day | ORAL | Status: DC

## 2015-03-08 NOTE — Patient Instructions (Signed)
Your physician wants you to follow-up in: 6 months or sooner if needed. You will receive a reminder letter in the mail two months in advance. If you don't receive a letter, please call our office to schedule the follow-up appointment.  You will be scheduled to have carotid dopplers and a echo prior to your next visit in 6 months.

## 2015-03-09 ENCOUNTER — Encounter: Payer: Self-pay | Admitting: Cardiovascular Disease

## 2015-03-09 NOTE — Progress Notes (Signed)
Patient ID: Dillon [name], male   DOB: 12-Nov-1933, 82 y.o.   MRN: [id number]     HPI: Dillon [name], is a 82 y.o. male who presents to the office today for a 7 month follow-up cardiology evaluation.  In November 2009 Mr. Hopfensperger underwent emergent CABG revascularization surgery after cardiac catheterization revealed severe life-threatening anatomy with 95% ostial left main stenosis a 99% ostial RCA stenosis. Surgery was done by Dr. Cyndia Bent and he had a LIMA to the LAD, vein to the obtuse marginal, vein to the RCA. His last nuclear perfusion study in April 2012 continued to show normal perfusion.  Mr. Lobato has documented carotid disease. A carotid Doppler study  in November 2013 which showed at least 60% stenosis in his carotid arteries bilaterally which was slightly increased from previously. A f/u carotid evaluation last year demonstrated his peak right internal carotid systolic velocity 485 slightly increased from 240 in his left PICA systolic velocity up to 11 slightly increased from 200; 50-69% diameter reduction range bilaterally and was not significantly changed from one year ago.  On 12/01/2013 a follow-up study demonstrated a peak PICA velocity was now 462 with diastolic velocity at 42 and the right carotid and 218 and 61 in the left carotid.  He remains asymptomatic and these place him in the upper end of scale in the 50-69% range  Additional problems include mixed hyperlipidemia and hypertension.  He has been on Toprol-XL 50 mg and losartan 100 mg in addition to amlodipine 5 mg for blood pressure control.. In the past he had derived marked benefit with Niaspan  as well as [name]. Follow-up laboratory on his current dose of [name] and niacin 1000 mg  revealed a total cholesterol 145, triglycerides 70, HDL 64, and LDL 67.  His glucose was 111.  TSH 4.5.  He had normal renal function with a BUN of 19 and creatinine of 1.1.  He underwent echo Doppler study on 06/06/2014.  This showed an  ejection fraction at 55-60%.  There was a small systolic gradient across his aortic valve with moderately calcified leaflets.  Valve area was 1.6 cm.  He had a mean gradient of 11 and a peak gradient of 23 mm suggestive of mild aortic stenosis.  PA pressure was 31 mm.  A nuclear perfusion study which remained normal with an ejection fraction of 59% and evidence for normal perfusion.  Since I last saw him, he has felt well and denies chest pain, PND orthopnea.  He denies presyncope or syncope.  He presents for evaluation.  Past Medical History  Diagnosis Date  . CAD (coronary artery disease)   . S/P CABG x 3 12/17/07    LIMA to LAD,SVG to left C    Past Surgical History  Procedure Laterality Date  . Coronary artery bypass graft  12/17/07    LIMA to LAD,vein to obtuse marginal,vein to RCA    No Known Allergies  Current Outpatient Prescriptions  Medication Sig Dispense Refill  . aspirin EC 81 MG tablet Take 81 mg by mouth daily.    . cholecalciferol (VITAMIN D) 1000 UNITS tablet Take 2,000 Units by mouth daily.    . fexofenadine (ALLEGRA) 180 MG tablet Take 180 mg by mouth daily.    . folic acid (FOLVITE) 1 MG tablet TAKE 1 TABLET EVERY DAY 90 tablet 0  . levothyroxine (SYNTHROID, LEVOTHROID) 112 MCG tablet Take 112 mcg by mouth daily.    . mupirocin ointment (BACTROBAN) 2 % Apply 1  application topically daily.    . niacin (NIASPAN) 1000 MG CR tablet Take 1 tablet (1,000 mg total) by mouth at bedtime. 90 tablet 3  . amLODipine (NORVASC) 5 MG tablet Take 1 tablet (5 mg total) by mouth daily. 90 tablet 3  . losartan (COZAAR) 100 MG tablet Take 1 tablet (100 mg total) by mouth daily. 90 tablet 3  . metoprolol succinate (TOPROL-XL) 50 MG 24 hr tablet TAKE 1 TABLET (50 MG TOTAL) BY MOUTH DAILY. 90 tablet 3  . rosuvastatin (CRESTOR) 40 MG tablet Take 1 tablet (40 mg total) by mouth daily. 90 tablet 3   No current facility-administered medications for this visit.    Socially he is married.  There are no children. He does remain active. He does walk. There is no tobacco use. He does drink occasional alcohol.  ROS General: Negative; No fevers, chills, or night sweats;  HEENT: Negative; No changes in vision or hearing, sinus congestion, difficulty swallowing Pulmonary: Negative; No cough, wheezing, shortness of breath, hemoptysis Cardiovascular:  See HPI GI: Negative; No nausea, vomiting, diarrhea, or abdominal pain GU: Negative; No dysuria, hematuria, or difficulty voiding Musculoskeletal: Negative; no myalgias, joint pain, or weakness Hematologic/Oncology: Negative; no easy bruising, bleeding Endocrine: Positive for hypothyroidism on Synthroid replacement. Neuro: Negative; no changes in balance, headaches Skin: Negative; No rashes or skin lesions Psychiatric: Negative; No behavioral problems, depression Sleep: Negative; No snoring, daytime sleepiness, hypersomnolence, bruxism, restless legs, hypnogognic hallucinations, no cataplexy Other comprehensive 14 point system review is negative.   PE BP 148/72 mmHg  Pulse 58  Ht '5\' 5"'$  (1.651 m)  Wt 172 lb (78.019 kg)  BMI 28.62 kg/m2   Repeat BP by me was 124/74.  Wt Readings from Last 3 Encounters:  03/08/15 172 lb (78.019 kg)  08/11/14 171 lb (77.565 kg)  06/06/14 169 lb (76.658 kg)   General: Alert, oriented, no distress.  HEENT: Normocephalic, atraumatic. Pupils round and reactive; sclera anicteric;no lid lag,  Nose without nasal septal hypertrophy Mouth/Parynx benign; Mallinpatti scale 2/3 Neck: No JVD, bilateral carotid bruits with normal upstroke Chest wall: Nontender to palpation Lungs: clear to ausculatation and percussion; no wheezing or rales Heart: RRR, s1 s2 normal 2/6 systolic murmur in the aortic area and left sternal border.  No diastolic murmur.  No rubs thrills or heaves  Abdomen: soft, nontender; no hepatosplenomehaly, BS+; abdominal aorta nontender and not dilated by palpation. Back: No CVA  tenderness Pulses 2+ Extremities: no clubbing cyanosis or edema, Homan's sign negative  Neurologic: grossly nonfocal Psychological: Normal affect and mood. Normal cognitive function  ECG (independently read by me): Sinus bradycardia with mild sinus arrhythmia at 58 bpm.  Mild RV conduction delay.  No significant ST segment changes.  July 2016 ECG (independently read by me): Sinus bradycardia with mild sinus arrhythmia, heart rate ranging from 48-58.  Prior December 2014 ECG: Normal sinus rhythm at 54 beats per minute; normal intervals.  LABS:  BMP Latest Ref Rng 01/18/2014 08/30/2012 12/22/2007  Glucose 70 - 99 mg/dL 111(H) 118(H) 88  BUN 6 - 23 mg/dL '19 14 15  '$ Creatinine 0.50 - 1.35 mg/dL 1.10 1.00 1.00  Sodium 135 - 145 mEq/L 131(L) 137 130(L)  Potassium 3.5 - 5.3 mEq/L 4.9 4.8 4.4  Chloride 96 - 112 mEq/L 95(L) 101 99  CO2 19 - 32 mEq/L '30 27 25  '$ Calcium 8.4 - 10.5 mg/dL 9.5 9.5 7.9(L)   Hepatic Function Latest Ref Rng 01/18/2014 08/30/2012  Total Protein 6.0 - 8.3 g/dL 7.0 7.2  Albumin 3.5 - 5.2 g/dL 4.2 4.3  AST 0 - 37 U/L 15 21  ALT 0 - 53 U/L 12 17  Alk Phosphatase 39 - 117 U/L 57 66  Total Bilirubin 0.2 - 1.2 mg/dL 0.4 0.4   CBC Latest Ref Rng 01/18/2014 08/30/2012 12/20/2007  WBC 4.0 - 10.5 K/uL 10.5 7.0 10.4  Hemoglobin 13.0 - 17.0 g/dL 14.5 14.5 8.8(L)  Hematocrit 39.0 - 52.0 % 42.7 43.5 25.8(L)  Platelets 150 - 400 K/uL 180 176 137(L)   Lab Results  Component Value Date   MCV 97.5 01/18/2014   MCV 97.8 08/30/2012   MCV 94.6 12/20/2007   Lab Results  Component Value Date   TSH 4.513* 01/18/2014  No results found for: HGBA1C   Lipid Panel     Component Value Date/Time   CHOL 145 01/18/2014 0803   CHOL 139 08/30/2012 0845   TRIG 70 01/18/2014 0803   TRIG 82 08/30/2012 0845   HDL 64 01/18/2014 0803   HDL 68 08/30/2012 0845   CHOLHDL 2.3 01/18/2014 0803   VLDL 14 01/18/2014 0803   LDLCALC 67 01/18/2014 0803   LDLCALC 55 08/30/2012 0845     ASSESSMENT  AND PLAN: Mr.Marcotte is a 82 year old gentleman who is 8 years status post emergent CABG revascularization surgery for life-threatening coronary anatomy. His blood pressure today is stable on current therapy consisting of losartan 100 mg, Toprol-XL 50 mg and amlodipine 5 mg. He has a history of mixed hyperlipidemia and continues to be on combination therapy with [name], niacin.  Since he had tolerated this well I had continued the niacin preparation.  He has not had recent laboratory.  Fasting laboratory will be obtained.  He has a murmur suggestive of aortic valve disease on physical examination and his echo Doppler study revealed a mean gradient of 11 and 23 mm peak gradient with a valve area of approximate 1.6 cm compatible with mild AS.  He has remained active and is asymptomatic with reference to his aortic stenosis.  His carotid disease has been  stable.  His body mass index today is 28.62.  His weight has been fairly stable.  He has a history of hypothyroidism on levothyroxine at 112 g.  He will continue his current medical regimen.  I will contact him with the results of his laboratory.  As long as he remains stable, I will see him in 6 months for reevaluation.  Time spent: 25 minutes  Troy Sine, MD, [facility]  03/09/2015 7:52 AM

## 2015-07-23 ENCOUNTER — Other Ambulatory Visit: Payer: Self-pay | Admitting: Cardiovascular Disease

## 2015-07-24 NOTE — Telephone Encounter (Signed)
Rx request sent to pharmacy.  

## 2015-08-01 ENCOUNTER — Telehealth: Payer: Self-pay | Admitting: Cardiovascular Disease

## 2015-08-01 NOTE — Telephone Encounter (Signed)
New Message  Pt call wanting to schedule an appt with Dr. Claiborne Billings for f/u appt after Echo and Doppler appt is complete. Pt states he only wants to see provider. Please call back to discuss

## 2015-08-20 ENCOUNTER — Ambulatory Visit (HOSPITAL_BASED_OUTPATIENT_CLINIC_OR_DEPARTMENT_OTHER): Payer: Medicare Other

## 2015-08-20 ENCOUNTER — Other Ambulatory Visit (HOSPITAL_COMMUNITY): Payer: Self-pay

## 2015-08-20 ENCOUNTER — Ambulatory Visit (HOSPITAL_COMMUNITY)
Admission: RE | Admit: 2015-08-20 | Discharge: 2015-08-20 | Disposition: A | Discharge status: Home / Self Care | Payer: Medicare Other | Source: Ambulatory Visit | Attending: Cardiovascular Disease | Admitting: Cardiovascular Disease

## 2015-08-20 DIAGNOSIS — I25810 Atherosclerosis of coronary artery bypass graft(s) without angina pectoris: Secondary | ICD-10-CM

## 2015-08-20 DIAGNOSIS — E785 Hyperlipidemia, unspecified: Secondary | ICD-10-CM | POA: Diagnosis not present

## 2015-08-20 DIAGNOSIS — Z87891 Personal history of nicotine dependence: Secondary | ICD-10-CM | POA: Insufficient documentation

## 2015-08-20 DIAGNOSIS — I10 Essential (primary) hypertension: Secondary | ICD-10-CM | POA: Insufficient documentation

## 2015-08-20 DIAGNOSIS — I6523 Occlusion and stenosis of bilateral carotid arteries: Secondary | ICD-10-CM | POA: Diagnosis not present

## 2015-08-20 DIAGNOSIS — I081 Rheumatic disorders of both mitral and tricuspid valves: Secondary | ICD-10-CM | POA: Insufficient documentation

## 2015-08-20 DIAGNOSIS — I779 Disorder of arteries and arterioles, unspecified: Secondary | ICD-10-CM | POA: Diagnosis present

## 2015-08-20 DIAGNOSIS — I739 Peripheral vascular disease, unspecified: Secondary | ICD-10-CM

## 2015-09-03 ENCOUNTER — Other Ambulatory Visit: Payer: Self-pay | Admitting: Cardiovascular Disease

## 2015-09-04 NOTE — Telephone Encounter (Signed)
Rx has been sent to the pharmacy electronically. ° °

## 2015-10-05 NOTE — Telephone Encounter (Signed)
error 

## 2015-10-30 ENCOUNTER — Ambulatory Visit (INDEPENDENT_AMBULATORY_CARE_PROVIDER_SITE_OTHER): Payer: Medicare Other | Admitting: Cardiovascular Disease

## 2015-10-30 ENCOUNTER — Encounter: Payer: Self-pay | Admitting: Cardiovascular Disease

## 2015-10-30 VITALS — BP 164/77 | HR 64 | Ht 66.0 in | Wt 173.8 lb

## 2015-10-30 DIAGNOSIS — I25810 Atherosclerosis of coronary artery bypass graft(s) without angina pectoris: Secondary | ICD-10-CM

## 2015-10-30 DIAGNOSIS — I779 Disorder of arteries and arterioles, unspecified: Secondary | ICD-10-CM

## 2015-10-30 DIAGNOSIS — I739 Peripheral vascular disease, unspecified: Principal | ICD-10-CM

## 2015-10-30 DIAGNOSIS — I10 Essential (primary) hypertension: Secondary | ICD-10-CM | POA: Diagnosis not present

## 2015-10-30 NOTE — Patient Instructions (Signed)
Medication Instructions:  NO CHANGES.   Follow-Up: You have been referred to DR [facility] FOR EVALUATION.  Your physician recommends that you schedule a follow-up appointment in: WITH DR [place].   If you need a refill on your cardiac medications before your next appointment, please call your pharmacy.

## 2015-10-30 NOTE — Progress Notes (Signed)
10/30/2015 Dillon [name]   19-Jan-1934  BH:[id number]  Primary Physician Daphene Calamity, MD Primary Cardiologist: Lorretta Harp MD Renae Gloss  HPI:  Dillon [name] is a delightful 82 year old mildly overweight married Caucasian male with no children referred by Dr. Claiborne Billings for peripheral vascular evaluation because of high-grade a symptomatic right internal carotid artery stenosis. He is retired Product/process development scientist. He does have a history of CAD status post emergent coronary artery bypass grafting by Dr. Cyndia Bent left main disease. November 2009 after carcatheterization revealed left main disease. His other problems include history of hypertension or hyperlipidemia. He's never had a heart attack or stroke. He denies chest pain or shortness of breath. He did have a Myoview stress test performed May of last year which was low risk.   Current Outpatient Prescriptions  Medication Sig Dispense Refill  . amLODipine (NORVASC) 5 MG tablet Take 1 tablet (5 mg total) by mouth daily. 90 tablet 3  . aspirin EC 81 MG tablet Take 81 mg by mouth daily.    . cholecalciferol (VITAMIN D) 1000 UNITS tablet Take 2,000 Units by mouth daily.    . fexofenadine (ALLEGRA) 180 MG tablet Take 180 mg by mouth daily.    . folic acid (FOLVITE) 1 MG tablet TAKE 1 TABLET EVERY DAY 90 tablet 1  . levothyroxine (SYNTHROID, LEVOTHROID) 112 MCG tablet Take 112 mcg by mouth daily.    Marland Kitchen losartan (COZAAR) 100 MG tablet Take 1 tablet (100 mg total) by mouth daily. 90 tablet 3  . metoprolol succinate (TOPROL-XL) 50 MG 24 hr tablet TAKE 1 TABLET (50 MG TOTAL) BY MOUTH DAILY. 90 tablet 3  . mupirocin ointment (BACTROBAN) 2 % Apply 1 application topically daily.    . niacin (NIASPAN) 1000 MG CR tablet TAKE 1 TABLET AT BEDTIME 90 tablet 1  . rosuvastatin (CRESTOR) 40 MG tablet Take 1 tablet (40 mg total) by mouth daily. 90 tablet 3   No current facility-administered medications for this visit.     No Known  Allergies  Social History   Social History  . Marital status: Married    Spouse name: N/A  . Number of children: N/A  . Years of education: N/A   Occupational History  . Not on file.   Social History Main Topics  . Smoking status: Former Smoker    Quit date: 01/28/1971  . Smokeless tobacco: Never Used  . Alcohol use Yes     Comment: socially  . Drug use: No  . Sexual activity: Not on file   Other Topics Concern  . Not on file   Social History Narrative  . No narrative on file     Review of Systems: General: negative for chills, fever, night sweats or weight changes.  Cardiovascular: negative for chest pain, dyspnea on exertion, edema, orthopnea, palpitations, paroxysmal nocturnal dyspnea or shortness of breath Dermatological: negative for rash Respiratory: negative for cough or wheezing Urologic: negative for hematuria Abdominal: negative for nausea, vomiting, diarrhea, bright red blood per rectum, melena, or hematemesis Neurologic: negative for visual changes, syncope, or dizziness All other systems reviewed and are otherwise negative except as noted above.    Blood pressure (!) 164/77, pulse 64, height 5\' 6"  (1.676 m), weight 173 lb 12.8 oz (78.8 kg), SpO2 98 %.  General appearance: alert and no distress Neck: no adenopathy, no JVD, supple, symmetrical, trachea midline, thyroid not enlarged, symmetric, no tenderness/mass/nodules and Bilateral carotid bruits Lungs: clear to auscultation bilaterally Heart: regular rate and  rhythm, S1, S2 normal, no murmur, click, rub or gallop Extremities: extremities normal, atraumatic, no cyanosis or edema  EKGsinus rhythm at 64 without ST or T-wave changes. I personally reviewed this EKGESSMENT AND PLAN:   Carotid disease, bilateral [facility]) Mr. Schnick is referred by Dr. Claiborne Billings for evaluation of bilateral carotid artery stenosis right worse than left. Recent Dopplers performed 08/20/15 showed severe right ICA stenosis in the 80+ percent  range and moderate left ICA stenosis. This represents progression of disease compared to the Doppler study performed May 2016. He is neurologically asymptomatic. He has low risk other than age to undergo elective right carotid endarterectomy. I'm referring him to Dr. [name] for consideration of that procedure. A Myoview performed 06/06/14 was low risk.      Lorretta Harp MD FACP,FACC,FAHA, [facility] 10/30/2015 10:23 AM

## 2015-10-30 NOTE — Assessment & Plan Note (Signed)
Dillon [name] is referred by Dr. Claiborne Billings for evaluation of bilateral carotid artery stenosis right worse than left. Recent Dopplers performed 08/20/15 showed severe right ICA stenosis in the 80+ percent range and moderate left ICA stenosis. This represents progression of disease compared to the Doppler study performed May 2016. He is neurologically asymptomatic. He has low risk other than age to undergo elective right carotid endarterectomy. I'm referring him to Dr. [name] for consideration of that procedure. A Myoview performed 06/06/14 was low risk.

## 2015-11-06 ENCOUNTER — Ambulatory Visit (INDEPENDENT_AMBULATORY_CARE_PROVIDER_SITE_OTHER): Payer: Medicare Other | Admitting: Cardiovascular Disease

## 2015-11-06 ENCOUNTER — Encounter: Payer: Self-pay | Admitting: Cardiovascular Disease

## 2015-11-06 VITALS — BP 130/76 | HR 52 | Ht 66.0 in | Wt 172.8 lb

## 2015-11-06 DIAGNOSIS — I779 Disorder of arteries and arterioles, unspecified: Secondary | ICD-10-CM | POA: Diagnosis not present

## 2015-11-06 DIAGNOSIS — I25810 Atherosclerosis of coronary artery bypass graft(s) without angina pectoris: Secondary | ICD-10-CM | POA: Diagnosis not present

## 2015-11-06 DIAGNOSIS — I739 Peripheral vascular disease, unspecified: Secondary | ICD-10-CM

## 2015-11-06 DIAGNOSIS — I2510 Atherosclerotic heart disease of native coronary artery without angina pectoris: Secondary | ICD-10-CM | POA: Diagnosis not present

## 2015-11-06 DIAGNOSIS — I119 Hypertensive heart disease without heart failure: Secondary | ICD-10-CM

## 2015-11-06 DIAGNOSIS — E782 Mixed hyperlipidemia: Secondary | ICD-10-CM

## 2015-11-06 DIAGNOSIS — I10 Essential (primary) hypertension: Secondary | ICD-10-CM

## 2015-11-06 MED ORDER — CLOPIDOGREL BISULFATE 75 MG PO TABS: 75.0000 mg | ORAL_TABLET | Freq: Every day | ORAL | 3 refills | Status: DC

## 2015-11-06 NOTE — Patient Instructions (Signed)
Your physician has recommended you make the following change in your medication:   Start new prescription for clopidogrel. This has been sent to your  Pharmacy.   Your physician wants you to follow-up in: 6 months or sooner if needed. You will receive a reminder letter in the mail two months in advance. If you don't receive a letter, please call our office to schedule the follow-up appointment.  If you need a refill on your cardiac medications before your next appointment, please call your pharmacy.

## 2015-11-06 NOTE — Progress Notes (Signed)
Patient ID: Dillon [name], male   DOB: September 01, 1933, 82 y.o.   MRN: [id number]     HPI: Dillon [name], is a 82 y.o. male who presents to the office today for a 7 month follow-up cardiology evaluation.  In November 2009 Dillon [name] underwent emergent CABG revascularization surgery after cardiac catheterization revealed severe life-threatening anatomy with 95% ostial left main stenosis a 99% ostial RCA stenosis. Surgery was done by Dr. Cyndia Bent and he had a LIMA to the LAD, vein to the obtuse marginal, vein to the RCA. His last nuclear perfusion study in April 2012 continued to show normal perfusion.  Dillon [name] has documented carotid disease. A carotid Doppler study  in November 2013 which showed at least 60% stenosis in his carotid arteries bilaterally which was slightly increased from previously. A f/u carotid evaluation last year demonstrated his peak right internal carotid systolic velocity 163 slightly increased from 240 in his left PICA systolic velocity up to 11 slightly increased from 200; 50-69% diameter reduction range bilaterally and was not significantly changed from one year ago.  On 12/01/2013 a follow-up study demonstrated a peak PICA velocity was now 845 with diastolic velocity at 42 and the right carotid and 218 and 61 in the left carotid.  He remains asymptomatic and these place him in the upper end of scale in the 50-69% range  Additional problems include mixed hyperlipidemia and hypertension.  He has been on Toprol-XL 50 mg and losartan 100 mg in addition to amlodipine 5 mg for blood pressure control.. In the past he had derived marked benefit with Niaspan  as well as [name]. Follow-up laboratory on his current dose of [name] and niacin 1000 mg  revealed a total cholesterol 145, triglycerides 70, HDL 64, and LDL 67.  His glucose was 111.  TSH 4.5.  He had normal renal function with a BUN of 19 and creatinine of 1.1.  He underwent echo Doppler study on 06/06/2014.  This showed an  ejection fraction at 55-60%.  There was a small systolic gradient across his aortic valve with moderately calcified leaflets.  Valve area was 1.6 cm.  He had a mean gradient of 11 and a peak gradient of 23 mm suggestive of mild aortic stenosis.  PA pressure was 31 mm.  A nuclear perfusion study which remained normal with an ejection fraction of 59% and evidence for normal perfusion.  He underwent a 2-D echo Doppler study on 08/20/2015 which showed an EF of 60-65%.  The aortic valve was calcified and thickened with mildly restricted motion.  Peak and mean gradients were 25 and 14 mm consistent with mild aortic stenosis with a valve area of 1.42 cm.  I scheduled him for follow-up carotid duplex exam which was done in July 2017.  This suggested progression of his right internal carotid stenoses with velocity now at 465/131, which places him in the greater than 80% range.  He had stable left internal carotid velocities now or in the 60-79% range.  He had normal subclavian arteries bilaterally, and patent vertebral arteries with antegrade flow.  I referred him to Dr. [name] who felt that with his asymptomatic status and low risk assessment that he should undergo carotid endarterectomy.  He was referred to Dr. [name] for an office evaluation but this has not yet been scheduled.  Since I last saw him, he has felt well and denies chest pain, PND orthopnea.  He denies paresthesias, presyncope or syncope.  Past Medical History:  Diagnosis  Date  . CAD (coronary artery disease)   . S/P CABG x 3 12/17/07   LIMA to LAD,SVG to left C    Past Surgical History:  Procedure Laterality Date  . CORONARY ARTERY BYPASS GRAFT  12/17/07   LIMA to LAD,vein to obtuse marginal,vein to RCA    No Known Allergies  Current Outpatient Prescriptions  Medication Sig Dispense Refill  . amLODipine (NORVASC) 5 MG tablet Take 1 tablet (5 mg total) by mouth daily. 90 tablet 3  . aspirin EC 81 MG tablet Take 81 mg by mouth daily.     . cholecalciferol (VITAMIN D) 1000 UNITS tablet Take 2,000 Units by mouth daily.    . fexofenadine (ALLEGRA) 180 MG tablet Take 180 mg by mouth daily.    . folic acid (FOLVITE) 1 MG tablet TAKE 1 TABLET EVERY DAY 90 tablet 1  . levothyroxine (SYNTHROID, LEVOTHROID) 112 MCG tablet Take 112 mcg by mouth daily.    Marland Kitchen losartan (COZAAR) 100 MG tablet Take 1 tablet (100 mg total) by mouth daily. 90 tablet 3  . metoprolol succinate (TOPROL-XL) 50 MG 24 hr tablet TAKE 1 TABLET (50 MG TOTAL) BY MOUTH DAILY. 90 tablet 3  . mupirocin ointment (BACTROBAN) 2 % Apply 1 application topically daily.    . niacin (NIASPAN) 1000 MG CR tablet TAKE 1 TABLET AT BEDTIME 90 tablet 1  . rosuvastatin (CRESTOR) 40 MG tablet Take 1 tablet (40 mg total) by mouth daily. 90 tablet 3  . clopidogrel (PLAVIX) 75 MG tablet Take 1 tablet (75 mg total) by mouth daily. 90 tablet 3   No current facility-administered medications for this visit.     Socially he is married. There are no children. He does remain active. He does walk. There is no tobacco use. He does drink occasional alcohol.  ROS General: Negative; No fevers, chills, or night sweats;  HEENT: Negative; No changes in vision or hearing, sinus congestion, difficulty swallowing Pulmonary: Negative; No cough, wheezing, shortness of breath, hemoptysis Cardiovascular:  See HPI GI: Negative; No nausea, vomiting, diarrhea, or abdominal pain GU: Negative; No dysuria, hematuria, or difficulty voiding Musculoskeletal: Negative; no myalgias, joint pain, or weakness Hematologic/Oncology: Negative; no easy bruising, bleeding Endocrine: Positive for hypothyroidism on Synthroid replacement. Neuro: Negative; no changes in balance, headaches Skin: Negative; No rashes or skin lesions Psychiatric: Negative; No behavioral problems, depression Sleep: Negative; No snoring, daytime sleepiness, hypersomnolence, bruxism, restless legs, hypnogognic hallucinations, no cataplexy Other  comprehensive 14 point system review is negative.   PE BP 130/76 (BP Location: Left Arm, Patient Position: Sitting, Cuff Size: Normal)   Pulse (!) 52   Ht _0  (1.676 m)   Wt 172 lb 12.8 oz (78.4 kg)   BMI 27.89 kg/m    Repeat BP by me was 130/70  Wt Readings from Last 3 Encounters:  11/06/15 172 lb 12.8 oz (78.4 kg)  10/30/15 173 lb 12.8 oz (78.8 kg)  03/08/15 172 lb (78 kg)   General: Alert, oriented, no distress.  HEENT: Normocephalic, atraumatic. Pupils round and reactive; sclera anicteric;no lid lag,  Nose without nasal septal hypertrophy Mouth/Parynx benign; Mallinpatti scale 2/3 Neck: No JVD, bilateral carotid bruits with normal upstroke Chest wall: Nontender to palpation Lungs: clear to ausculatation and percussion; no wheezing or rales Heart: RRR, s1 s2 normal 2/6 systolic murmur in the aortic area and left sternal border.  No diastolic murmur.  No rubs thrills or heaves  Abdomen: soft, nontender; no hepatosplenomehaly, BS+; abdominal aorta nontender and not dilated  by palpation. Back: No CVA tenderness Pulses 2+ Extremities: no clubbing cyanosis or edema, Homan's sign negative  Neurologic: grossly nonfocal Psychological: Normal affect and mood. Normal cognitive function  February 2017 ECG (independently read by me): Sinus bradycardia with mild sinus arrhythmia at 58 bpm.  Mild RV conduction delay.  No significant ST segment changes.  July 2016 ECG (independently read by me): Sinus bradycardia with mild sinus arrhythmia, heart rate ranging from 48-58.  Prior December 2014 ECG: Normal sinus rhythm at 54 beats per minute; normal intervals.  LABS: I personally reviewed the blood work which he brought with him from [facility] done on 07/19/2015.   Total cholesterol 142, triglycerides 120, HDL 61, LDL 57.  BMP Latest Ref Rng & Units 01/18/2014 08/30/2012 12/22/2007  Glucose 70 - 99 mg/dL 111(H) 118(H) 88  BUN 6 - 23 mg/dL _0 Creatinine 0.50 - 1.35 mg/dL 1.10 1.00 1.00   Sodium 135 - 145 mEq/L 131(L) 137 130(L)  Potassium 3.5 - 5.3 mEq/L 4.9 4.8 4.4  Chloride 96 - 112 mEq/L 95(L) 101 99  CO2 19 - 32 mEq/L _1 Calcium 8.4 - 10.5 mg/dL 9.5 9.5 7.9(L)   Hepatic Function Latest Ref Rng & Units 01/18/2014 08/30/2012  Total Protein 6.0 - 8.3 g/dL 7.0 7.2  Albumin 3.5 - 5.2 g/dL 4.2 4.3  AST 0 - 37 U/L 15 21  ALT 0 - 53 U/L 12 17  Alk Phosphatase 39 - 117 U/L 57 66  Total Bilirubin 0.2 - 1.2 mg/dL 0.4 0.4   CBC Latest Ref Rng & Units 01/18/2014 08/30/2012 12/20/2007  WBC 4.0 - 10.5 K/uL 10.5 7.0 10.4  Hemoglobin 13.0 - 17.0 g/dL 14.5 14.5 8.8(L)  Hematocrit 39.0 - 52.0 % 42.7 43.5 25.8(L)  Platelets 150 - 400 K/uL 180 176 137(L)   Lab Results  Component Value Date   MCV 97.5 01/18/2014   MCV 97.8 08/30/2012   MCV 94.6 12/20/2007   Lab Results  Component Value Date   TSH 4.513 (H) 01/18/2014  No results found for: HGBA1C   Lipid Panel     Component Value Date/Time   CHOL 145 01/18/2014 0803   CHOL 139 08/30/2012 0845   TRIG 70 01/18/2014 0803   TRIG 82 08/30/2012 0845   HDL 64 01/18/2014 0803   HDL 68 08/30/2012 0845   CHOLHDL 2.3 01/18/2014 0803   VLDL 14 01/18/2014 0803   LDLCALC 67 01/18/2014 0803   LDLCALC 55 08/30/2012 0845     ASSESSMENT AND PLAN: DillonChandler is a 82 year old gentleman who isstatus post emergent CABG revascularization surgery for life-threatening coronary anatomy in November 2009. His blood pressure today is stable on current therapy consisting of losartan 100 mg, Toprol-XL 50 mg and amlodipine 5 mg. He has a history of mixed hyperlipidemia and continues to be on combination therapy with [name], niacin.  His most recent lipid studies done at [facility] are excellent.    He has a murmur suggestive of aortic valve disease on physical examination and his echo Doppler study revealed a mean gradient of 11 and 23 mm peak gradient with a valve area of approximate 1.6 cm compatible with mild AS.  I had referred her for carotid  Doppler study this past summer which suggested progression of his right internal carotid which is now progressed to greater than 80%.  After an initial PVD evaluation by Dr. [name].  He will be seeing Dr. [name] for consideration for carotid endarterectomy.  I am starting  him on Plavix 75 mg daily to take in addition to his baby aspirin for additional antiplatelet benefit pending his surgery.  He has a history of hypothyroidism on levothyroxine at 112 g.  He will continue his current medical regimen.  We will contact Dr. Stephens Shire office to see when his appointment can be scheduled sooner rather than later.  As long as he remains stable, I will see him in 6 months for reevaluation.  Time spent: 25 minutes  Troy Sine, MD, [facility]  11/06/2015 7:29 PM

## 2015-11-08 ENCOUNTER — Other Ambulatory Visit: Payer: Self-pay

## 2015-11-08 DIAGNOSIS — I6521 Occlusion and stenosis of right carotid artery: Secondary | ICD-10-CM

## 2015-11-22 ENCOUNTER — Encounter: Payer: Self-pay | Admitting: Surgery

## 2015-11-26 ENCOUNTER — Ambulatory Visit (INDEPENDENT_AMBULATORY_CARE_PROVIDER_SITE_OTHER): Payer: Medicare Other | Admitting: Surgery

## 2015-11-26 ENCOUNTER — Ambulatory Visit (HOSPITAL_COMMUNITY)
Admission: RE | Admit: 2015-11-26 | Discharge: 2015-11-26 | Disposition: A | Discharge status: Home / Self Care | Payer: Medicare Other | Source: Ambulatory Visit | Attending: Surgery | Admitting: Surgery

## 2015-11-26 ENCOUNTER — Encounter: Payer: Self-pay | Admitting: Surgery

## 2015-11-26 VITALS — BP 140/66 | HR 58 | Temp 98.4°F | Resp 22 | Ht 65.0 in | Wt 170.0 lb

## 2015-11-26 DIAGNOSIS — I6521 Occlusion and stenosis of right carotid artery: Secondary | ICD-10-CM | POA: Diagnosis not present

## 2015-11-26 LAB — VAS US CAROTID
LCCAPDIAS: 17 cm/s
LCCAPSYS: 79 cm/s
LEFT ECA DIAS: 5 cm/s
LEFT VERTEBRAL DIAS: 14 cm/s
LICADDIAS: -23 cm/s
LICAPSYS: -240 cm/s
Left CCA dist dias: -22 cm/s
Left CCA dist sys: -113 cm/s
Left ICA dist sys: -67 cm/s
Left ICA prox dias: -56 cm/s
RIGHT CCA MID DIAS: -11 cm/s
RIGHT ECA DIAS: 22 cm/s
RIGHT VERTEBRAL DIAS: -10 cm/s
Right CCA prox dias: 9 cm/s
Right CCA prox sys: 70 cm/s

## 2015-11-26 NOTE — Progress Notes (Signed)
[facility]ist of [facility]  Patient name: Dillon [name] MRN: [id number] DOB: 09-19-1933 Sex: male  REFERRING PHYSICIAN: Dr. Claiborne Billings  REASON FOR CONSULT: carotid stenosis  HPI: Dillon [name] is a 82 y.o. male, who is referred today for evaluation of bilateral carotid stenosis, right greater than left.  The patient is asymptomatic.  Specifically, he denies numbness or weakness in either extremity.  He denies slurred speech.  He denies amaurosis fugax.  Patient has a history of three-vessel CABG by Dr. Cyndia Bent.  He is medically managed for hypercholesterolemia with a statin.  He is a former smoker.  He is medically managed for hypertension with an ARB  Past Medical History:  Diagnosis Date  . CAD (coronary artery disease)   . S/P CABG x 3 12/17/07   LIMA to LAD,SVG to left C    No family history on file.  SOCIAL HISTORY: Social History   Social History  . Marital status: Married    Spouse name: N/A  . Number of children: N/A  . Years of education: N/A   Occupational History  . Not on file.   Social History Main Topics  . Smoking status: Former Smoker    Quit date: 01/28/1971  . Smokeless tobacco: Never Used  . Alcohol use Yes     Comment: socially  . Drug use: No  . Sexual activity: Not on file   Other Topics Concern  . Not on file   Social History Narrative  . No narrative on file    No Known Allergies  Current Outpatient Prescriptions  Medication Sig Dispense Refill  . amLODipine (NORVASC) 5 MG tablet Take 1 tablet (5 mg total) by mouth daily. 90 tablet 3  . aspirin EC 81 MG tablet Take 81 mg by mouth daily.    . cholecalciferol (VITAMIN D) 1000 UNITS tablet Take 2,000 Units by mouth daily.    . clopidogrel (PLAVIX) 75 MG tablet Take 1 tablet (75 mg total) by mouth daily. 90 tablet 3  . fexofenadine (ALLEGRA) 180 MG tablet Take 180 mg by mouth daily.    . folic acid (FOLVITE) 1 MG tablet TAKE 1 TABLET EVERY DAY 90  tablet 1  . levothyroxine (SYNTHROID, LEVOTHROID) 112 MCG tablet Take 112 mcg by mouth daily.    Marland Kitchen losartan (COZAAR) 100 MG tablet Take 1 tablet (100 mg total) by mouth daily. 90 tablet 3  . metoprolol succinate (TOPROL-XL) 50 MG 24 hr tablet TAKE 1 TABLET (50 MG TOTAL) BY MOUTH DAILY. 90 tablet 3  . mupirocin ointment (BACTROBAN) 2 % Apply 1 application topically daily.    . niacin (NIASPAN) 1000 MG CR tablet TAKE 1 TABLET AT BEDTIME 90 tablet 1  . rosuvastatin (CRESTOR) 40 MG tablet Take 1 tablet (40 mg total) by mouth daily. 90 tablet 3   No current facility-administered medications for this visit.     REVIEW OF SYSTEMS:  [X]  denotes positive finding, [ ]  denotes negative finding Cardiac  Comments:  Chest pain or chest pressure:    Shortness of breath upon exertion:    Short of breath when lying flat:    Irregular heart rhythm:        Vascular    Pain in calf, thigh, or hip brought on by ambulation:    Pain in feet at night that wakes you up from your sleep:     Blood clot in your veins:    Leg swelling:         Pulmonary  Oxygen at home:    Productive cough:     Wheezing:         Neurologic    Sudden weakness in arms or legs:     Sudden numbness in arms or legs:     Sudden onset of difficulty speaking or slurred speech:    Temporary loss of vision in one eye:     Problems with dizziness:         Gastrointestinal    Blood in stool:     Vomited blood:         Genitourinary    Burning when urinating:     Blood in urine:        Psychiatric    Major depression:         Hematologic    Bleeding problems:    Problems with blood clotting too easily:        Skin    Rashes or ulcers:        Constitutional    Fever or chills:      PHYSICAL EXAM: Vitals:   11/26/15 1426 11/26/15 1431  BP: (!) 156/68 140/66  Pulse: (!) 58 (!) 58  Resp: (!) 22   Temp: 98.4 F (36.9 C)   TempSrc: Oral   SpO2: 96%   Weight: 170 lb (77.1 kg)   Height: 5\' 5"  (1.651 m)      GENERAL: The patient is a well-nourished male, in no acute distress. The vital signs are documented above. CARDIAC: There is a regular rate and rhythm.  VASCULAR: Regular rhythm with systolic ejection murmur.  Faint right carotid bruit PULMONARY: There is good air exchange bilaterally without wheezing or rales. ABDOMEN: Soft and non-tender with normal pitched bowel sounds.  No pulsatile mass MUSCULOSKELETAL: There are no major deformities or cyanosis. NEUROLOGIC: No focal weakness or paresthesias are detected. SKIN: There are no ulcers or rashes noted. PSYCHIATRIC: The patient has a normal affect.  DATA:  I have reviewed his ultrasound studies.  This shows greater than 80% right carotid stenosis and 60% left carotid stenosis.  The bifurcation is in the mid neck.  ASSESSMENT AND PLAN: Asymptomatic right carotid stenosis: We discussed our treatment options and have elected to proceed with right carotid endarterectomy.  The risks and benefits of the operation were discussed with the patient including the risk of stroke, nerve injury, and numbness along the right side of the face.  All of his questions were answered.  I would like to stop his Plavix 5 days prior to his operation.  The patient would like to wait until after the holidays to have the surgery done.  Therefore he will contact me with a date in early January.  If it gets to March and we still haven't done his surgery, he would need a repeat ultrasound as well as a clinic visit.  We discussed the signs and symptoms of stroke and what to do should they occur.   [name], MD [facility] Specialists of [facility] [phone] Pager [phone]

## 2015-12-28 DIAGNOSIS — J189 Pneumonia, unspecified organism: Secondary | ICD-10-CM

## 2015-12-28 HISTORY — DX: Pneumonia, unspecified organism: J18.9

## 2016-01-07 ENCOUNTER — Emergency Department (HOSPITAL_COMMUNITY): Payer: Medicare Other

## 2016-01-07 ENCOUNTER — Inpatient Hospital Stay (HOSPITAL_COMMUNITY)
Admission: EM | Admit: 2016-01-07 | Discharge: 2016-01-16 | DRG: 853 | Disposition: A | Discharge status: Home / Self Care | Payer: Medicare Other | Attending: Internal Medicine | Admitting: Internal Medicine

## 2016-01-07 ENCOUNTER — Encounter (HOSPITAL_COMMUNITY): Payer: Self-pay | Admitting: *Deleted

## 2016-01-07 DIAGNOSIS — N179 Acute kidney failure, unspecified: Secondary | ICD-10-CM | POA: Diagnosis present

## 2016-01-07 DIAGNOSIS — E039 Hypothyroidism, unspecified: Secondary | ICD-10-CM | POA: Diagnosis present

## 2016-01-07 DIAGNOSIS — E782 Mixed hyperlipidemia: Secondary | ICD-10-CM | POA: Diagnosis present

## 2016-01-07 DIAGNOSIS — Z87891 Personal history of nicotine dependence: Secondary | ICD-10-CM | POA: Diagnosis not present

## 2016-01-07 DIAGNOSIS — E785 Hyperlipidemia, unspecified: Secondary | ICD-10-CM

## 2016-01-07 DIAGNOSIS — N183 Chronic kidney disease, stage 3 (moderate): Secondary | ICD-10-CM | POA: Diagnosis present

## 2016-01-07 DIAGNOSIS — K5900 Constipation, unspecified: Secondary | ICD-10-CM | POA: Diagnosis present

## 2016-01-07 DIAGNOSIS — I1310 Hypertensive heart and chronic kidney disease without heart failure, with stage 1 through stage 4 chronic kidney disease, or unspecified chronic kidney disease: Secondary | ICD-10-CM | POA: Diagnosis present

## 2016-01-07 DIAGNOSIS — Z9689 Presence of other specified functional implants: Secondary | ICD-10-CM

## 2016-01-07 DIAGNOSIS — Z7902 Long term (current) use of antithrombotics/antiplatelets: Secondary | ICD-10-CM | POA: Diagnosis not present

## 2016-01-07 DIAGNOSIS — Z8249 Family history of ischemic heart disease and other diseases of the circulatory system: Secondary | ICD-10-CM | POA: Diagnosis not present

## 2016-01-07 DIAGNOSIS — J869 Pyothorax without fistula: Secondary | ICD-10-CM | POA: Diagnosis present

## 2016-01-07 DIAGNOSIS — Z951 Presence of aortocoronary bypass graft: Secondary | ICD-10-CM | POA: Diagnosis not present

## 2016-01-07 DIAGNOSIS — J189 Pneumonia, unspecified organism: Secondary | ICD-10-CM | POA: Diagnosis present

## 2016-01-07 DIAGNOSIS — Z9889 Other specified postprocedural states: Secondary | ICD-10-CM

## 2016-01-07 DIAGNOSIS — I2510 Atherosclerotic heart disease of native coronary artery without angina pectoris: Secondary | ICD-10-CM | POA: Diagnosis present

## 2016-01-07 DIAGNOSIS — Z79899 Other long term (current) drug therapy: Secondary | ICD-10-CM

## 2016-01-07 DIAGNOSIS — J181 Lobar pneumonia, unspecified organism: Secondary | ICD-10-CM

## 2016-01-07 DIAGNOSIS — A419 Sepsis, unspecified organism: Secondary | ICD-10-CM | POA: Diagnosis present

## 2016-01-07 DIAGNOSIS — I10 Essential (primary) hypertension: Secondary | ICD-10-CM | POA: Diagnosis present

## 2016-01-07 DIAGNOSIS — J939 Pneumothorax, unspecified: Secondary | ICD-10-CM

## 2016-01-07 DIAGNOSIS — D649 Anemia, unspecified: Secondary | ICD-10-CM | POA: Diagnosis present

## 2016-01-07 DIAGNOSIS — R0602 Shortness of breath: Secondary | ICD-10-CM

## 2016-01-07 DIAGNOSIS — Z7982 Long term (current) use of aspirin: Secondary | ICD-10-CM | POA: Diagnosis not present

## 2016-01-07 LAB — COMPREHENSIVE METABOLIC PANEL
ALBUMIN: 2.3 g/dL — AB (ref 3.5–5.0)
ALT: 71 U/L — ABNORMAL HIGH (ref 17–63)
AST: 46 U/L — ABNORMAL HIGH (ref 15–41)
Alkaline Phosphatase: 148 U/L — ABNORMAL HIGH (ref 38–126)
Anion gap: 10 (ref 5–15)
BUN: 19 mg/dL (ref 6–20)
CHLORIDE: 99 mmol/L — AB (ref 101–111)
CO2: 23 mmol/L (ref 22–32)
Calcium: 9.8 mg/dL (ref 8.9–10.3)
Creatinine, Ser: 1.77 mg/dL — ABNORMAL HIGH (ref 0.61–1.24)
GFR calc Af Amer: 40 mL/min — ABNORMAL LOW (ref >60)
GFR calc non Af Amer: 35 mL/min — ABNORMAL LOW (ref >60)
GLUCOSE: 175 mg/dL — AB (ref 65–99)
Potassium: 4.5 mmol/L (ref 3.5–5.1)
SODIUM: 132 mmol/L — AB (ref 135–145)
Total Bilirubin: 0.3 mg/dL (ref 0.3–1.2)
Total Protein: 6.8 g/dL (ref 6.5–8.1)

## 2016-01-07 LAB — CBC WITH DIFFERENTIAL/PLATELET
BASOS ABS: 0 10*3/uL (ref 0.0–0.1)
Basophils Relative: 0 %
EOS ABS: 0 10*3/uL (ref 0.0–0.7)
Eosinophils Relative: 0 %
HCT: 30.1 % — ABNORMAL LOW (ref 39.0–52.0)
HEMOGLOBIN: 10.1 g/dL — AB (ref 13.0–17.0)
Lymphocytes Relative: 5 %
Lymphs Abs: 1.3 10*3/uL (ref 0.7–4.0)
MCH: 31.3 pg (ref 26.0–34.0)
MCHC: 33.6 g/dL (ref 30.0–36.0)
MCV: 93.2 fL (ref 78.0–100.0)
MONO ABS: 1.3 10*3/uL — AB (ref 0.1–1.0)
Monocytes Relative: 5 %
NEUTROS ABS: 22.8 10*3/uL — AB (ref 1.7–7.7)
Neutrophils Relative %: 90 %
PLATELETS: 516 10*3/uL — AB (ref 150–400)
RBC: 3.23 MIL/uL — ABNORMAL LOW (ref 4.22–5.81)
RDW: 13.8 % (ref 11.5–15.5)
WBC: 25.4 10*3/uL — ABNORMAL HIGH (ref 4.0–10.5)

## 2016-01-07 MED ORDER — AMLODIPINE BESYLATE 5 MG PO TABS
5.0000 mg | ORAL_TABLET | Freq: Every day | ORAL | Status: DC
  Administered 2016-01-07 – 2016-01-10 (×4): 5 mg via ORAL
  Filled 2016-01-07 (×4): qty 1

## 2016-01-07 MED ORDER — DEXTROSE 5 % IV SOLN
1.0000 g | INTRAVENOUS | Status: DC
  Administered 2016-01-08 – 2016-01-10 (×3): 1 g via INTRAVENOUS
  Filled 2016-01-07 (×4): qty 10

## 2016-01-07 MED ORDER — ASPIRIN EC 81 MG PO TBEC
81.0000 mg | DELAYED_RELEASE_TABLET | Freq: Every day | ORAL | Status: DC
  Administered 2016-01-07 – 2016-01-10 (×4): 81 mg via ORAL
  Filled 2016-01-07 (×4): qty 1

## 2016-01-07 MED ORDER — SODIUM CHLORIDE 0.9% FLUSH
3.0000 mL | Freq: Two times a day (BID) | INTRAVENOUS | Status: DC
  Administered 2016-01-08: 10 mL via INTRAVENOUS
  Administered 2016-01-09 – 2016-01-10 (×2): 3 mL via INTRAVENOUS

## 2016-01-07 MED ORDER — ROSUVASTATIN CALCIUM 10 MG PO TABS
40.0000 mg | ORAL_TABLET | Freq: Every day | ORAL | Status: DC
  Administered 2016-01-07 – 2016-01-15 (×9): 40 mg via ORAL
  Filled 2016-01-07 (×7): qty 2
  Filled 2016-01-07: qty 4
  Filled 2016-01-07 (×2): qty 2

## 2016-01-07 MED ORDER — SODIUM CHLORIDE 0.9 % IV BOLUS (SEPSIS)
1000.0000 mL | Freq: Once | INTRAVENOUS | Status: AC
  Administered 2016-01-07: 1000 mL via INTRAVENOUS

## 2016-01-07 MED ORDER — LEVOTHYROXINE SODIUM 112 MCG PO TABS
112.0000 ug | ORAL_TABLET | Freq: Every day | ORAL | Status: DC
  Administered 2016-01-08 – 2016-01-16 (×8): 112 ug via ORAL
  Filled 2016-01-07 (×9): qty 1

## 2016-01-07 MED ORDER — DEXTROSE 5 % IV SOLN
500.0000 mg | Freq: Once | INTRAVENOUS | Status: AC
  Administered 2016-01-07: 500 mg via INTRAVENOUS
  Filled 2016-01-07: qty 500

## 2016-01-07 MED ORDER — NIACIN ER (ANTIHYPERLIPIDEMIC) 500 MG PO TBCR
1000.0000 mg | EXTENDED_RELEASE_TABLET | Freq: Every day | ORAL | Status: DC
  Administered 2016-01-07 – 2016-01-15 (×9): 1000 mg via ORAL
  Filled 2016-01-07 (×13): qty 2

## 2016-01-07 MED ORDER — DEXTROSE 5 % IV SOLN
1.0000 g | Freq: Once | INTRAVENOUS | Status: AC
  Administered 2016-01-07: 1 g via INTRAVENOUS
  Filled 2016-01-07: qty 10

## 2016-01-07 MED ORDER — METOPROLOL SUCCINATE ER 25 MG PO TB24
50.0000 mg | ORAL_TABLET | Freq: Every day | ORAL | Status: DC
  Administered 2016-01-08 – 2016-01-10 (×3): 50 mg via ORAL
  Filled 2016-01-07 (×3): qty 1

## 2016-01-07 MED ORDER — SODIUM CHLORIDE 0.9 % IV SOLN
INTRAVENOUS | Status: DC
  Administered 2016-01-07 – 2016-01-09 (×3): via INTRAVENOUS
  Administered 2016-01-09: 1000 mL via INTRAVENOUS

## 2016-01-07 MED ORDER — HEPARIN SODIUM (PORCINE) 5000 UNIT/ML IJ SOLN
5000.0000 [IU] | Freq: Three times a day (TID) | INTRAMUSCULAR | Status: DC
  Administered 2016-01-07 – 2016-01-10 (×8): 5000 [IU] via SUBCUTANEOUS
  Filled 2016-01-07 (×10): qty 1

## 2016-01-07 MED ORDER — DEXTROSE 5 % IV SOLN
500.0000 mg | INTRAVENOUS | Status: DC
  Administered 2016-01-08 – 2016-01-10 (×3): 500 mg via INTRAVENOUS
  Filled 2016-01-07 (×4): qty 500

## 2016-01-07 NOTE — ED Provider Notes (Signed)
Care assumed from Shary Decamp, PA-C at 4pm. Please refer to his note for full history and MDM this point. Briefly patient is a 82 year old male who presents as a referral from PCP office. Patient has had a cough x2 weeks. He is evaluated his PCPs office today chest x-rays ordered. Chest x-ray showed left lower lobe pneumonia with possible left upper lobe empyema. Patient was prescribed doxycycline. However due to concern for empyema patient was advised to go to the emergency department for further evaluation. He has not started taking antibiotics yet. At the time I assumed care, patient clinically stable. Patient has been discussed with the hospitalist and will likely be admitted for further management of treated for pneumonia. He has been started on azithromycin and Rocephin for CAP coverage. Blood cultures are pending. Hospitalist recommended CT surgery consult. CT chest without contrast (due to renal function) is pending. If CT chest shows significant empyema plan is to discuss with CT surgery for possible intervention. Chest CT shows moderate-sized loculated left pleural effusion that could possibly be an empyema. In addition patient has likely left lower lobe pneumonia. Patient was discussed with CT surgery who do not patient needs CT surgery intervention at this time. Recommend admission to hospitalist service and IR drainage of pleural effusion. IR order placed. Patient was again discussed with the hospitalist and patient will be admitted to telemetry monitoring for further management.   During this shift, care supervised by Dr. Kathrynn Humble, ED attending   Gibson Ramp, MD 01/08/16 [phone]

## 2016-01-07 NOTE — ED Notes (Signed)
Brought patient back to room with family in tow; patient getting undressed, in a gown, on continuous pulse oximetry and blood pressure cuff; visitor at bedside

## 2016-01-07 NOTE — ED Notes (Signed)
Placed pt on 2L [place] due to sats 90-93% on room air

## 2016-01-07 NOTE — H&P (Addendum)
History and Physical    Dillon [name] [id number] DOB: 10-Mar-1933 DOA: 01/07/2016  PCP: Daphene Calamity, MD  Patient coming from: Home  Chief Complaint: cough x 2 weeks   HPI: Dillon [name] is a 82 y.o. male with medical history significant of CAD s/p CABG, HTN, HLD, hypothyroidism who started having productive cough of yellow sputum about 2 weeks ago. This had progressively worsened and he was evaluated by PCP in office today. Chest x-ray was obtained and patient was prescribed doxycycline and prednisone. Chest x-ray results came back and showed pneumonia as well as possible empyema. Patient was then directed to the emergency department. He denies any fevers, chills, chest pain. He states that his cough is now nonproductive. He denies any nausea, vomiting, diarrhea or abdominal pain, no dysuria, peripheral edema or rash.  ED Course: CT chest was obtained. CTS was consulted over the phone, and with evaluation of scan, they did not feel that he was a candidate for cardiothoracic surgery. They did recommend IR evaluation for thoracentesis.  Review of Systems: As per HPI otherwise 10 point review of systems negative.   Past Medical History:  Diagnosis Date  . CAD (coronary artery disease)   . S/P CABG x 3 12/17/07   LIMA to LAD,SVG to left C    Past Surgical History:  Procedure Laterality Date  . CORONARY ARTERY BYPASS GRAFT  12/17/07   LIMA to LAD,vein to obtuse marginal,vein to RCA     reports that he quit smoking about 44 years ago. His smoking use included Cigarettes. He has never used smokeless tobacco. He reports that he drinks alcohol. He reports that he does not use drugs.  No Known Allergies  Family History  Problem Relation Age of Onset  . Heart attack Father     Prior to Admission medications   Medication Sig Start Date End Date Taking? Authorizing Provider  amLODipine (NORVASC) 5 MG tablet Take 1 tablet (5 mg total) by mouth daily. Patient taking differently:  Take 5 mg by mouth at bedtime.  03/08/15  Yes Troy Sine, MD  aspirin EC 81 MG tablet Take 81 mg by mouth at bedtime.    Yes Historical Provider, MD  Cholecalciferol (VITAMIN D) 2000 units tablet Take 2,000 Units by mouth at bedtime.   Yes Historical Provider, MD  clopidogrel (PLAVIX) 75 MG tablet Take 1 tablet (75 mg total) by mouth daily. 11/06/15  Yes Troy Sine, MD  fexofenadine (ALLEGRA) 180 MG tablet Take 180 mg by mouth daily.   Yes Historical Provider, MD  folic acid (FOLVITE) 1 MG tablet TAKE 1 TABLET EVERY DAY 09/04/15  Yes Minus Breeding, MD  GuaiFENesin (MUCINEX PO) Take 500 mg by mouth 2 (two) times daily as needed (cough).   Yes Historical Provider, MD  levothyroxine (SYNTHROID, LEVOTHROID) 112 MCG tablet Take 112 mcg by mouth daily.   Yes Historical Provider, MD  losartan (COZAAR) 100 MG tablet Take 1 tablet (100 mg total) by mouth daily. 03/08/15  Yes Troy Sine, MD  metoprolol succinate (TOPROL-XL) 50 MG 24 hr tablet TAKE 1 TABLET (50 MG TOTAL) BY MOUTH DAILY. 03/08/15  Yes Troy Sine, MD  mupirocin ointment (BACTROBAN) 2 % Apply 1 application topically daily as needed (wound care).  12/30/12  Yes Historical Provider, MD  naproxen sodium (ALEVE) 220 MG tablet Take 440 mg by mouth daily as needed (pain).   Yes Historical Provider, MD  niacin (NIASPAN) 1000 MG CR tablet TAKE 1 TABLET AT BEDTIME 07/24/15  Yes Troy Sine, MD  rosuvastatin (CRESTOR) 40 MG tablet Take 1 tablet (40 mg total) by mouth daily. Patient taking differently: Take 40 mg by mouth at bedtime.  03/08/15  Yes Troy Sine, MD  terbinafine (LAMISIL) 1 % cream Apply 1 application topically daily as needed (apply as directed after acid bath treatment).   Yes Historical Provider, MD  benzonatate (TESSALON) 200 MG capsule Take 200 mg by mouth 3 (three) times daily as needed for cough.  01/07/16   Historical Provider, MD  doxycycline (VIBRAMYCIN) 100 MG capsule Take 100 mg by mouth 2 (two) times daily. 10 day  course filled 01/07/16 01/07/16   Historical Provider, MD  HYDROcodone-homatropine (HYCODAN) 5-1.5 MG/5ML syrup Take 5 mLs by mouth at bedtime as needed for cough.  01/07/16   Historical Provider, MD  predniSONE (DELTASONE) 10 MG tablet Take 10-30 mg by mouth See admin instructions. Tapered course filled 01/07/16 - take 3 tablets (30 mg) by mouth daily for 2 days, then take 2 tablets (20 mg) daily for 2 days, then take 1 tablet (10 mg) daily for 2 days, then stop 01/07/16   Historical Provider, MD    Physical Exam: Vitals:   01/07/16 1615 01/07/16 1645 01/07/16 1700 01/07/16 1730  BP: 118/55 119/58 124/58 129/67  Pulse: 87 87 86 81  Resp:      Temp:      TempSrc:      SpO2: 94% 91% 92% 96%  Weight:      Height:        Constitutional: NAD, calm, comfortable, face is flushed  Eyes: PERRL, lids and conjunctivae normal ENMT: Mucous membranes are moist. Posterior pharynx clear of any exudate or lesions.Normal dentition.  Neck: normal, supple, no masses, no thyromegaly Respiratory: Diminished breath sounds on left, expiratory wheezes bilaterally. On nasal cannula O2. No conversational dyspnea, no respiratory distress. Cardiovascular: Regular rate and rhythm, no murmurs / rubs / gallops. No extremity edema. 2+ pedal pulses. No carotid bruits.  Abdomen: Distended but soft, no tenderness, no masses palpated. No hepatosplenomegaly. Bowel sounds positive.  Musculoskeletal: no clubbing / cyanosis. No joint deformity upper and lower extremities. Good ROM, no contractures. Normal muscle tone.  Skin: areas of erythema and rash over scalp and face, chronic  Neurologic: CN 2-12 grossly intact. Strength 5/5 in all 4.  Psychiatric: Normal judgment and insight. Alert and oriented x 3. Normal mood.   Labs on Admission: I have personally reviewed following labs and imaging studies  CBC:  Recent Labs Lab 01/07/16 1244  WBC 25.4*  NEUTROABS 22.8*  HGB 10.1*  HCT 30.1*  MCV 93.2  PLT 516*   Basic  Metabolic Panel:  Recent Labs Lab 01/07/16 1244  NA 132*  K 4.5  CL 99*  CO2 23  GLUCOSE 175*  BUN 19  CREATININE 1.77*  CALCIUM 9.8   GFR: Estimated Creatinine Clearance: 32.2 mL/min (by C-G formula based on SCr of 1.77 mg/dL (H)). Liver Function Tests:  Recent Labs Lab 01/07/16 1244  AST 46*  ALT 71*  ALKPHOS 148*  BILITOT 0.3  PROT 6.8  ALBUMIN 2.3*   No results for input(s): LIPASE, AMYLASE in the last 168 hours. No results for input(s): AMMONIA in the last 168 hours. Coagulation Profile: No results for input(s): INR, PROTIME in the last 168 hours. Cardiac Enzymes: No results for input(s): CKTOTAL, CKMB, CKMBINDEX, TROPONINI in the last 168 hours. BNP (last 3 results) No results for input(s): PROBNP in the last 8760 hours. HbA1C: No  results for input(s): HGBA1C in the last 72 hours. CBG: No results for input(s): GLUCAP in the last 168 hours. Lipid Profile: No results for input(s): CHOL, HDL, LDLCALC, TRIG, CHOLHDL, LDLDIRECT in the last 72 hours. Thyroid Function Tests: No results for input(s): TSH, T4TOTAL, FREET4, T3FREE, THYROIDAB in the last 72 hours. Anemia Panel: No results for input(s): VITAMINB12, FOLATE, FERRITIN, TIBC, IRON, RETICCTPCT in the last 72 hours. Urine analysis: No results found for: COLORURINE, APPEARANCEUR, LABSPEC, PHURINE, GLUCOSEU, HGBUR, BILIRUBINUR, KETONESUR, PROTEINUR, UROBILINOGEN, NITRITE, LEUKOCYTESUR Sepsis Labs: !!!!!!!!!!!!!!!!!!!!!!!!!!!!!!!!!!!!!!!!!!!! @LABRCNTIP (procalcitonin:4,lacticidven:4) )No results found for this or any previous visit (from the past 240 hour(s)).   Radiological Exams on Admission: Ct Chest Wo Contrast  Result Date: 01/07/2016 CLINICAL DATA:  Cough for several weeks. Left basilar infiltrate and loculated left pleural effusion suspicious for empyema. EXAM: CT CHEST WITHOUT CONTRAST TECHNIQUE: Multidetector CT imaging of the chest was performed following the standard protocol without IV contrast.  COMPARISON:  Radiographs 01/07/2016 and 01/11/2008. FINDINGS: Cardiovascular: Status post median sternotomy and CABG. There is diffuse atherosclerosis of the aorta, great vessels and coronary arteries. Aortic valvular calcifications are present. The heart size is normal. There is no significant pericardial fluid. Mediastinum/Nodes: There is prominent subcarinal soft tissue mass consistent with adenopathy. This measures 12 mm short axis on image 29. No other enlarged mediastinal or hilar lymph nodes are identified. Hilar assessment is limited by the lack of intravenous contrast. The thyroid gland, trachea and esophagus demonstrate no significant findings. Lungs/Pleura: As seen on earlier radiographs, there is a moderate size loculated left pleural effusion. There are loculated components posterolaterally with extension into the fissure. This effusion is suboptimally characterized without contrast, but measures near water density. There is no significant pleural fluid on the right. There is a small air- fluid level at the left lung base, measuring 1.8 cm on image 100 of series 5. This is likely intraparenchymal and is surrounded by a parenchymal opacity or mass measuring up to 5.2 x 5.6 cm on image 98. There is additional patchy airspace disease or atelectasis in the left lower lobe. The right lung is clear. Mild emphysematous changes are present. Upper abdomen: Mild hepatic steatosis. There is a calcified 12 mm gallstone in the gallbladder neck. No gallbladder wall thickening or surrounding inflammation. No evidence of adrenal mass. There is some lobularity of the pancreatic tail which measures up to 3.9 cm on image 58. Musculoskeletal/Chest wall: There is no chest wall mass or suspicious osseous finding. Old healed rib fractures noted on the left. IMPRESSION: 1. Moderate size loculated left pleural effusion could reflect empyema in the appropriate clinical context. This is incompletely characterized by this  noncontrast study. Consider thoracentesis. 2. Patchy left lower lobe airspace disease with possible cavitary pneumonia or mass inferiorly. Follow-up imaging post treatment for pneumonia recommended. 3. Diffuse atherosclerosis post CABG. Aortic valvular calcifications. 4. Cholelithiasis with possible pancreatic tail mass versus normal variant. Recommend follow-up abdominal CT at the time of chest CT follow-up (preferentially with intravenous contrast if possible). Electronically Signed   By: Richardean Sale M.D.   On: 01/07/2016 16:05    Assessment/Plan Principal Problem:   Empyema (HCC) Active Problems:   CAD (coronary artery disease)   Hyperlipidemia, mixed   Hypothyroid   HTN (hypertension)   Pneumonia   AKI (acute kidney injury) ([place])   Sepsis ([place])   Sepsis secondary to empyema/loculated left pleural effusion -HR 93, RR 20, WBC 25.4 on admission -Continue rocephin/azithromax  -Blood cultures pending -IR consulted for thoracentesis -Check HIV  -  IVF    Acute kidney injury  -Baseline Cr 1.1 -In setting of sepsis -IVF   CAD s/p CABG -Aspirin, will hold plavix due to thoracentesis   HTN -Continue norvasc, metoprolol  -Hold cozaar due to AKI   Hypothyroidism -Continue synthroid  HLD -Continue crestor, niacin   Possible pancreatic tail mass versus normal variant on CT -Recommend follow-up abdominal CT with contrast in near future (at time of follow up CT chest possibly)     DVT prophylaxis: subq hep  Code Status: Full  Family Communication: wife at bedside Disposition Plan: pending further improvement Consults called: IR consulted by ED   Admission status: Inpatient  It is my clinical opinion that admission to [facility] is reasonable and necessary in this 82 y.o. year old male   presenting with symptoms of cough and shortness of breath, concerning for loculated effusion and empyema   in the context of PMH including: CAD, HTN, HLD, AKI   with pertinent  positives on physical exam including: wheezing and dec breath sound on left  and pertinent positives on radiographic and laboratory data including: CT as above  Workup and treatment include IR consult for thoracentesis, IV antibiotics .  Given the aforementioned, the predictability of an adverse outcome is felt to be significant. I expect that the patient will require at least 2 midnights in the hospital to treat this condition.    Dessa Phi, DO [facility] [email or web address] Password [email or web address] 01/07/2016, 6:19 PM

## 2016-01-07 NOTE — ED Triage Notes (Signed)
Pt states was sent here b/c they found a "puss pocket" in his lungs on an X-ray.  X-ray completed at [facility] in [facility].  His pcp sent him here to be evaluated by a pulmonologist.  Pt no in any respiratory distress.

## 2016-01-07 NOTE — ED Provider Notes (Signed)
[facility] DEPT Provider Note   CSN: [id number] Arrival date & time: 01/07/16  1152     History   Chief Complaint Chief Complaint  Patient presents with  . Cough    HPI Dillon [name] is a 82 y.o. male.  HPI  82 y.o. male with a hx of CAD, CABG x 3, presents to the Emergency Department today from PCP due to possible empyema. PT states cough x 2 weeks. States he got better and then got worse after 1 week. Saw PCP today and given Rx Doxy, Prednisone. CXR result came back and showed pneumonia as well as possible empyema. Sent for eval of empyema. No CP/SOB/ABD pain. No fevers. No N/V/D. No other symptoms noted..   Past Medical History:  Diagnosis Date  . CAD (coronary artery disease)   . S/P CABG x 3 12/17/07   LIMA to LAD,SVG to left C    Patient Active Problem List   Diagnosis Date Noted  . Carotid disease, bilateral ([place]) 01/06/2013  . HTN (hypertension) 01/06/2013  . CAD (coronary artery disease) 07/05/2012  . Hyperlipidemia, mixed 07/05/2012  . Hypertensive heart disease 07/05/2012  . Hypothyroid 07/05/2012    Past Surgical History:  Procedure Laterality Date  . CORONARY ARTERY BYPASS GRAFT  12/17/07   LIMA to LAD,vein to obtuse marginal,vein to RCA       Home Medications    Prior to Admission medications   Medication Sig Start Date End Date Taking? Authorizing Provider  amLODipine (NORVASC) 5 MG tablet Take 1 tablet (5 mg total) by mouth daily. 03/08/15   Troy Sine, MD  aspirin EC 81 MG tablet Take 81 mg by mouth daily.    Historical Provider, MD  cholecalciferol (VITAMIN D) 1000 UNITS tablet Take 2,000 Units by mouth daily.    Historical Provider, MD  clopidogrel (PLAVIX) 75 MG tablet Take 1 tablet (75 mg total) by mouth daily. 11/06/15   Troy Sine, MD  fexofenadine (ALLEGRA) 180 MG tablet Take 180 mg by mouth daily.    Historical Provider, MD  folic acid (FOLVITE) 1 MG tablet TAKE 1 TABLET EVERY DAY 09/04/15   Minus Breeding, MD  levothyroxine  (SYNTHROID, LEVOTHROID) 112 MCG tablet Take 112 mcg by mouth daily.    Historical Provider, MD  losartan (COZAAR) 100 MG tablet Take 1 tablet (100 mg total) by mouth daily. 03/08/15   Troy Sine, MD  metoprolol succinate (TOPROL-XL) 50 MG 24 hr tablet TAKE 1 TABLET (50 MG TOTAL) BY MOUTH DAILY. 03/08/15   Troy Sine, MD  mupirocin ointment (BACTROBAN) 2 % Apply 1 application topically daily. 12/30/12   Historical Provider, MD  niacin (NIASPAN) 1000 MG CR tablet TAKE 1 TABLET AT BEDTIME 07/24/15   Troy Sine, MD  rosuvastatin (CRESTOR) 40 MG tablet Take 1 tablet (40 mg total) by mouth daily. 03/08/15   Troy Sine, MD    Family History No family history on file.  Social History Social History  Substance Use Topics  . Smoking status: Former Smoker    Quit date: 01/28/1971  . Smokeless tobacco: Never Used  . Alcohol use Yes     Comment: socially     Allergies   Patient has no known allergies.   Review of Systems Review of Systems ROS reviewed and all are negative for acute change except as noted in the HPI.  Physical Exam Updated Vital Signs BP 138/71 (BP Location: Right Arm)   Pulse 93   Temp 98.9 F (37.2  C) (Oral)   Resp 20   Ht 5' 5.5" (1.664 m)   Wt 76.7 kg   SpO2 95%   BMI 27.70 kg/m   Physical Exam  Constitutional: He is oriented to person, place, and time. Vital signs are normal. He appears well-developed and well-nourished.  HENT:  Head: Normocephalic and atraumatic.  Right Ear: Hearing normal.  Left Ear: Hearing normal.  Eyes: Conjunctivae and EOM are normal. Pupils are equal, round, and reactive to light.  Neck: Normal range of motion. Neck supple.  Cardiovascular: Normal rate, regular rhythm, normal heart sounds and intact distal pulses.   Pulmonary/Chest: Effort normal. No respiratory distress. He has wheezes in the right upper field, the right lower field, the left upper field and the left lower field. He has rales in the left lower field.    Abdominal: Soft.  Musculoskeletal: Normal range of motion.  Neurological: He is alert and oriented to person, place, and time.  Skin: Skin is warm and dry.  Psychiatric: He has a normal mood and affect. His speech is normal and behavior is normal. Thought content normal.  Nursing note and vitals reviewed.  ED Treatments / Results  Labs (all labs ordered are listed, but only abnormal results are displayed) Labs Reviewed  CBC WITH DIFFERENTIAL/PLATELET - Abnormal; Notable for the following:       Result Value   WBC 25.4 (*)    RBC 3.23 (*)    Hemoglobin 10.1 (*)    HCT 30.1 (*)    Platelets 516 (*)    Neutro Abs 22.8 (*)    Monocytes Absolute 1.3 (*)    All other components within normal limits  COMPREHENSIVE METABOLIC PANEL - Abnormal; Notable for the following:    Sodium 132 (*)    Chloride 99 (*)    Glucose, Bld 175 (*)    Creatinine, Ser 1.77 (*)    Albumin 2.3 (*)    AST 46 (*)    ALT 71 (*)    Alkaline Phosphatase 148 (*)    GFR calc non Af Amer 35 (*)    GFR calc Af Amer 40 (*)    All other components within normal limits  CULTURE, BLOOD (ROUTINE X 2)  CULTURE, BLOOD (ROUTINE X 2)    EKG  EKG Interpretation None       Radiology No results found.  Procedures Procedures (including critical care time)  Medications Ordered in ED Medications - No data to display   Initial Impression / Assessment and Plan / ED Course  I have reviewed the triage vital signs and the nursing notes.  Pertinent labs & imaging results that were available during my care of the patient were reviewed by me and considered in my medical decision making (see chart for details).  Clinical Course    Final Clinical Impressions(s) / ED Diagnoses  {I have reviewed and evaluated the relevant laboratory values. {I have reviewed and evaluated the relevant imaging studies.  {I have reviewed the relevant previous healthcare records.  {I obtained HPI from historian. {Patient discussed with  supervising physician.  ED Course:  Assessment: Pt is a 8yM with hx CAD who presents for eval of empyema. Known Pneumonia dx by PCP today. No fever. No CP/SOB. On exam, pt in NAD. Nontoxic/nonseptic appearing. VSS. Afebrile. Lungs with diffuse wheeze and rales LLL. Heart RRR. Abdomen nontender soft. CBC with leukocytosis 25. No tachycardia. Normotensive. Afebrile. No tachypnea.  Does not meet SIRS/Sepsis criteria. Review of CX[place] from [place] shows  LLL pneumonia with possible Empyema in left upper lung. Given Azithro/Rocephin in ED for CAP. Creatinine elevated so CT Chest with contrast withheld. CT non contrast ordered. Discussed with attending physician. Plan is to hydrate and admit to medicine with further evaluation of Empyema.   3:13 PM- Spoke with medicine. Will consult CT surgery once CT non contrast results. If not candidate for CT surgery, consult pulmonary critical care with admit to medicine.   CXR- 01/07/16 1.Left lower lobe atelectasis and left lower lobe infiltrate. 2. What appears be prominent loculated fluid collections are noted over the upper left major fissure and over the left lateral chest. Empyema cannot be excluded . 3. Prior CABG.Heart size normal. Electronically Signed ByAlpha Gula On: 01/07/2016 10:07  3:46 PM- Sign out to Darnelle Bos, MD Pending CT Chest for disposition  Disposition/Plan:  Pending CT Chest  Supervising Physician Jola Schmidt, MD  Final diagnoses:  Community acquired pneumonia of left lower lobe of lung [facility])    New Prescriptions New Prescriptions   No medications on file     Shary Decamp, PA-C 01/07/16 [place], MD 01/07/16 [phone]

## 2016-01-07 NOTE — ED Notes (Signed)
Pt to xray

## 2016-01-08 ENCOUNTER — Inpatient Hospital Stay (HOSPITAL_COMMUNITY): Payer: Medicare Other

## 2016-01-08 LAB — BASIC METABOLIC PANEL
ANION GAP: 8 (ref 5–15)
BUN: 19 mg/dL (ref 6–20)
CHLORIDE: 101 mmol/L (ref 101–111)
CO2: 24 mmol/L (ref 22–32)
CREATININE: 1.85 mg/dL — AB (ref 0.61–1.24)
Calcium: 9.1 mg/dL (ref 8.9–10.3)
GFR calc non Af Amer: 33 mL/min — ABNORMAL LOW (ref >60)
GFR, EST AFRICAN AMERICAN: 38 mL/min — AB (ref >60)
Glucose, Bld: 141 mg/dL — ABNORMAL HIGH (ref 65–99)
POTASSIUM: 4.6 mmol/L (ref 3.5–5.1)
SODIUM: 133 mmol/L — AB (ref 135–145)

## 2016-01-08 LAB — BODY FLUID CELL COUNT WITH DIFFERENTIAL
LYMPHS FL: 1 %
Neutrophil Count, Fluid: 99 % — ABNORMAL HIGH (ref 0–25)
Total Nucleated Cell Count, Fluid: 18180 cu mm — ABNORMAL HIGH (ref 0–1000)

## 2016-01-08 LAB — CBC
HCT: 28.8 % — ABNORMAL LOW (ref 39.0–52.0)
HEMOGLOBIN: 9.4 g/dL — AB (ref 13.0–17.0)
MCH: 31.1 pg (ref 26.0–34.0)
MCHC: 32.6 g/dL (ref 30.0–36.0)
MCV: 95.4 fL (ref 78.0–100.0)
Platelets: 457 10*3/uL — ABNORMAL HIGH (ref 150–400)
RBC: 3.02 MIL/uL — AB (ref 4.22–5.81)
RDW: 14.2 % (ref 11.5–15.5)
WBC: 23.7 10*3/uL — ABNORMAL HIGH (ref 4.0–10.5)

## 2016-01-08 LAB — HEPATIC FUNCTION PANEL
ALBUMIN: 1.9 g/dL — AB (ref 3.5–5.0)
ALK PHOS: 145 U/L — AB (ref 38–126)
ALT: 67 U/L — AB (ref 17–63)
AST: 61 U/L — AB (ref 15–41)
Bilirubin, Direct: 0.1 mg/dL (ref 0.1–0.5)
Indirect Bilirubin: 0.1 mg/dL — ABNORMAL LOW (ref 0.3–0.9)
TOTAL PROTEIN: 6 g/dL — AB (ref 6.5–8.1)
Total Bilirubin: 0.2 mg/dL — ABNORMAL LOW (ref 0.3–1.2)

## 2016-01-08 LAB — PROTEIN, BODY FLUID: TOTAL PROTEIN, FLUID: 4.3 g/dL

## 2016-01-08 LAB — HIV ANTIBODY (ROUTINE TESTING W REFLEX): HIV Screen 4th Generation wRfx: NONREACTIVE

## 2016-01-08 LAB — LACTATE DEHYDROGENASE, PLEURAL OR PERITONEAL FLUID: LD, Fluid: 2913 U/L — ABNORMAL HIGH (ref 3–23)

## 2016-01-08 LAB — LACTATE DEHYDROGENASE: LDH: 155 U/L (ref 98–192)

## 2016-01-08 LAB — GLUCOSE, SEROUS FLUID: GLUCOSE FL: 30 mg/dL

## 2016-01-08 MED ORDER — DOCUSATE SODIUM 100 MG PO CAPS
100.0000 mg | ORAL_CAPSULE | Freq: Two times a day (BID) | ORAL | Status: DC | PRN
  Administered 2016-01-08 – 2016-01-09 (×2): 100 mg via ORAL
  Filled 2016-01-08 (×2): qty 1

## 2016-01-08 MED ORDER — LIDOCAINE HCL 1 % IJ SOLN
INTRAMUSCULAR | Status: AC
  Filled 2016-01-08: qty 20

## 2016-01-08 NOTE — Progress Notes (Signed)
New Admission Note:  Arrival Method: By bed from ED around 2000. Mental Orientation: Alert and oriented x 4 Telemetry: Yes, box 9, NSR Assessment: Completed Skin: Completed with Dillon [name], refer to flowsheets Iv: Left forearm infusing NS at 100 Pain: Denies Tubes: None Safety Measures: Safety Fall Prevention Plan was given and discussed. Yellow socks and armband applied.  Admission: Completed refer to flowsheets [place] Orientation: Patient has been orientated to the room, unit and the staff. Family: Wife at bedside  Orders have been reviewed and implemented. Will continue to monitor the patient. Call light has been placed within reach and bed alarm has been activated.   Perry Mount, RN  Phone Number: [phone]

## 2016-01-08 NOTE — Progress Notes (Signed)
PROGRESS NOTE    Dillon [name]  [id number] DOB: 25-Mar-1933 DOA: 01/07/2016 PCP: Daphene Calamity, MD     Brief Narrative:  Dillon [name] is a 82 y.o. male with medical history significant of CAD s/p CABG, HTN, HLD, hypothyroidism who started having productive cough of yellow sputum about 2 weeks ago. This had progressively worsened and he was evaluated by PCP in office today. Chest x-ray was obtained and patient was prescribed doxycycline and prednisone. Chest x-ray results came back and showed pneumonia as well as possible empyema. CT chest was obtained. CTS was consulted over the phone, and with evaluation of scan, they did not feel that he was a candidate for cardiothoracic surgery. They recommended thoracentesis.  Assessment & Plan:   Principal Problem:   Empyema ([place]) Active Problems:   CAD (coronary artery disease)   Hyperlipidemia, mixed   Hypothyroid   HTN (hypertension)   Pneumonia   AKI (acute kidney injury) ([place])   Sepsis ([place])   Sepsis secondary to empyema/loculated left pleural effusion -HR 93, RR 20, WBC 25.4 on admission -Continue rocephin/azithromax  -Blood cultures pending -Thoracentesis ordered, pending today  -Check HIV  -IVF    Acute kidney injury  -Baseline Cr 1.1 -In setting of sepsis -IVF   CAD s/p CABG -Aspirin, will hold plavix due to thoracentesis   HTN -Continue norvasc, metoprolol  -Hold cozaar due to AKI   Hypothyroidism -Continue synthroid  HLD -Continue crestor, niacin   Possible pancreatic tail mass versus normal variant on CT -Recommend follow-up abdominal CT with contrast in near future (at time of follow up CT chest possibly)    DVT prophylaxis: subq hep Code Status: Full Family Communication: wife at bedside Disposition Plan: pending further improvement   Consultants:   None  Procedures:   None  Antimicrobials:   Vanco/cefepime 12/11 >>    Subjective: Patient continues to be short of breath and  has significant nonproductive cough. No new complaints today. No chest pain, nausea or vomiting. He is having some constipation and asked for stool softeners.   Objective: Vitals:   01/07/16 1930 01/07/16 2013 01/08/16 0542 01/08/16 0945  BP: (!) 129/53 (!) 129/53 (!) 96/53 (!) 126/46  Pulse: 86 90 85 96  Resp: 22 20 16 16   Temp:  99.2 F (37.3 C) 98.5 F (36.9 C) 97.8 F (36.6 C)  TempSrc:  Oral Oral Oral  SpO2: 95% 94% 92% 97%  Weight:  75.8 kg (167 lb)    Height:        Intake/Output Summary (Last 24 hours) at 01/08/16 1105 Last data filed at 01/08/16 [id number]  Gross per 24 hour  Intake              120 ml  Output                0 ml  Net              120 ml   Filed Weights   01/07/16 1231 01/07/16 2013  Weight: 76.7 kg (169 lb) 75.8 kg (167 lb)    Examination:  General exam: Appears calm and comfortable  Respiratory system:  Diminished breath sounds on left, rhonchi diffusely. On nasal cannula O2, no conversational dyspnea and no respiratory distress. Cardiovascular system: S1 & S2 heard, RRR. No JVD, murmurs, rubs, gallops or clicks. No pedal edema. Gastrointestinal system: Abdomen is nondistended, soft and nontender. No organomegaly or masses felt. Normal bowel sounds heard. Central nervous system: Alert and oriented. No focal neurological deficits.  Extremities: Symmetric 5 x 5 power. Skin: No rashes, lesions or ulcers Psychiatry: Judgement and insight appear normal. Mood & affect appropriate.   Data Reviewed: I have personally reviewed following labs and imaging studies  CBC:  Recent Labs Lab 01/07/16 1244 01/08/16 0500  WBC 25.4* 23.7*  NEUTROABS 22.8*  --   HGB 10.1* 9.4*  HCT 30.1* 28.8*  MCV 93.2 95.4  PLT 516* [id number]*   Basic Metabolic Panel:  Recent Labs Lab 01/07/16 1244 01/08/16 0500  NA 132* 133*  K 4.5 4.6  CL 99* 101  CO2 23 24  GLUCOSE 175* 141*  BUN 19 19  CREATININE 1.77* 1.85*  CALCIUM 9.8 9.1   GFR: Estimated Creatinine  Clearance: 30.6 mL/min (by C-G formula based on SCr of 1.85 mg/dL (H)). Liver Function Tests:  Recent Labs Lab 01/07/16 1244 01/08/16 0500  AST 46* 61*  ALT 71* 67*  ALKPHOS 148* 145*  BILITOT 0.3 0.2*  PROT 6.8 6.0*  ALBUMIN 2.3* 1.9*   No results for input(s): LIPASE, AMYLASE in the last 168 hours. No results for input(s): AMMONIA in the last 168 hours. Coagulation Profile: No results for input(s): INR, PROTIME in the last 168 hours. Cardiac Enzymes: No results for input(s): CKTOTAL, CKMB, CKMBINDEX, TROPONINI in the last 168 hours. BNP (last 3 results) No results for input(s): PROBNP in the last 8760 hours. HbA1C: No results for input(s): HGBA1C in the last 72 hours. CBG: No results for input(s): GLUCAP in the last 168 hours. Lipid Profile: No results for input(s): CHOL, HDL, LDLCALC, TRIG, CHOLHDL, LDLDIRECT in the last 72 hours. Thyroid Function Tests: No results for input(s): TSH, T4TOTAL, FREET4, T3FREE, THYROIDAB in the last 72 hours. Anemia Panel: No results for input(s): VITAMINB12, FOLATE, FERRITIN, TIBC, IRON, RETICCTPCT in the last 72 hours. Sepsis Labs: No results for input(s): PROCALCITON, LATICACIDVEN in the last 168 hours.  No results found for this or any previous visit (from the past 240 hour(s)).     Radiology Studies: Ct Chest Wo Contrast  Result Date: 01/07/2016 CLINICAL DATA:  Cough for several weeks. Left basilar infiltrate and loculated left pleural effusion suspicious for empyema. EXAM: CT CHEST WITHOUT CONTRAST TECHNIQUE: Multidetector CT imaging of the chest was performed following the standard protocol without IV contrast. COMPARISON:  Radiographs 01/07/2016 and 01/11/2008. FINDINGS: Cardiovascular: Status post median sternotomy and CABG. There is diffuse atherosclerosis of the aorta, great vessels and coronary arteries. Aortic valvular calcifications are present. The heart size is normal. There is no significant pericardial fluid.  Mediastinum/Nodes: There is prominent subcarinal soft tissue mass consistent with adenopathy. This measures 12 mm short axis on image 29. No other enlarged mediastinal or hilar lymph nodes are identified. Hilar assessment is limited by the lack of intravenous contrast. The thyroid gland, trachea and esophagus demonstrate no significant findings. Lungs/Pleura: As seen on earlier radiographs, there is a moderate size loculated left pleural effusion. There are loculated components posterolaterally with extension into the fissure. This effusion is suboptimally characterized without contrast, but measures near water density. There is no significant pleural fluid on the right. There is a small air- fluid level at the left lung base, measuring 1.8 cm on image 100 of series 5. This is likely intraparenchymal and is surrounded by a parenchymal opacity or mass measuring up to 5.2 x 5.6 cm on image 98. There is additional patchy airspace disease or atelectasis in the left lower lobe. The right lung is clear. Mild emphysematous changes are present. Upper abdomen: Mild hepatic steatosis.  There is a calcified 12 mm gallstone in the gallbladder neck. No gallbladder wall thickening or surrounding inflammation. No evidence of adrenal mass. There is some lobularity of the pancreatic tail which measures up to 3.9 cm on image 58. Musculoskeletal/Chest wall: There is no chest wall mass or suspicious osseous finding. Old healed rib fractures noted on the left. IMPRESSION: 1. Moderate size loculated left pleural effusion could reflect empyema in the appropriate clinical context. This is incompletely characterized by this noncontrast study. Consider thoracentesis. 2. Patchy left lower lobe airspace disease with possible cavitary pneumonia or mass inferiorly. Follow-up imaging post treatment for pneumonia recommended. 3. Diffuse atherosclerosis post CABG. Aortic valvular calcifications. 4. Cholelithiasis with possible pancreatic tail mass  versus normal variant. Recommend follow-up abdominal CT at the time of chest CT follow-up (preferentially with intravenous contrast if possible). Electronically Signed   By: Richardean Sale M.D.   On: 01/07/2016 16:05      Scheduled Meds: . lidocaine      . amLODipine  5 mg Oral QHS  . aspirin EC  81 mg Oral QHS  . azithromycin  500 mg Intravenous Q24H  . cefTRIAXone (ROCEPHIN)  IV  1 g Intravenous Q24H  . heparin  5,000 Units Subcutaneous Q8H  . levothyroxine  112 mcg Oral QAC breakfast  . metoprolol succinate  50 mg Oral Daily  . niacin  1,000 mg Oral QHS  . rosuvastatin  40 mg Oral QHS  . sodium chloride flush  3 mL Intravenous Q12H   Continuous Infusions: . sodium chloride 100 mL/hr at 01/07/16 2041     LOS: 1 day    Time spent: 30 minutes   Dessa Phi, DO [facility] [email or web address] Password [email or web address] 01/08/2016, 11:05 AM

## 2016-01-08 NOTE — Procedures (Signed)
Ultrasound-guided diagnostic and therapeutic left thoracentesis performed yielding 50 cc turbid, amber colored fluid. No immediate complications. Follow-up chest x-ray pending.The fluid was sent to the lab for preordered studies. Due to extensive multiloculated nature of pleural collection only the above amount of fluid could be aspirated. If symptoms persists/worsen consider f/u CT chest and TCTS consult.

## 2016-01-09 DIAGNOSIS — J869 Pyothorax without fistula: Secondary | ICD-10-CM

## 2016-01-09 LAB — CBC WITH DIFFERENTIAL/PLATELET
Basophils Absolute: 0 10*3/uL (ref 0.0–0.1)
Basophils Relative: 0 %
EOS PCT: 0 %
Eosinophils Absolute: 0 10*3/uL (ref 0.0–0.7)
HCT: 28.8 % — ABNORMAL LOW (ref 39.0–52.0)
Hemoglobin: 9.5 g/dL — ABNORMAL LOW (ref 13.0–17.0)
LYMPHS ABS: 1.6 10*3/uL (ref 0.7–4.0)
Lymphocytes Relative: 7 %
MCH: 31.1 pg (ref 26.0–34.0)
MCHC: 33 g/dL (ref 30.0–36.0)
MCV: 94.4 fL (ref 78.0–100.0)
MONO ABS: 0.9 10*3/uL (ref 0.1–1.0)
Monocytes Relative: 4 %
NEUTROS ABS: 20.4 10*3/uL — AB (ref 1.7–7.7)
Neutrophils Relative %: 89 %
PLATELETS: 500 10*3/uL — AB (ref 150–400)
RBC: 3.05 MIL/uL — AB (ref 4.22–5.81)
RDW: 14.6 % (ref 11.5–15.5)
WBC: 22.9 10*3/uL — AB (ref 4.0–10.5)

## 2016-01-09 LAB — BASIC METABOLIC PANEL
ANION GAP: 9 (ref 5–15)
BUN: 16 mg/dL (ref 6–20)
CO2: 20 mmol/L — ABNORMAL LOW (ref 22–32)
CREATININE: 1.59 mg/dL — AB (ref 0.61–1.24)
Calcium: 8.9 mg/dL (ref 8.9–10.3)
Chloride: 105 mmol/L (ref 101–111)
GFR calc Af Amer: 46 mL/min — ABNORMAL LOW (ref >60)
GFR, EST NON AFRICAN AMERICAN: 39 mL/min — AB (ref >60)
GLUCOSE: 244 mg/dL — AB (ref 65–99)
Potassium: 4 mmol/L (ref 3.5–5.1)
Sodium: 134 mmol/L — ABNORMAL LOW (ref 135–145)

## 2016-01-09 LAB — GRAM STAIN

## 2016-01-09 LAB — PH, BODY FLUID: pH, Body Fluid: 6.8

## 2016-01-09 MED ORDER — BISACODYL 10 MG RE SUPP
10.0000 mg | Freq: Once | RECTAL | Status: AC
  Administered 2016-01-09: 10 mg via RECTAL
  Filled 2016-01-09: qty 1

## 2016-01-09 MED ORDER — POLYETHYLENE GLYCOL 3350 17 G PO PACK
17.0000 g | PACK | Freq: Every day | ORAL | Status: DC
  Administered 2016-01-09 – 2016-01-10 (×2): 17 g via ORAL
  Filled 2016-01-09 (×2): qty 1

## 2016-01-09 MED ORDER — ALUM & MAG HYDROXIDE-SIMETH 200-200-20 MG/5ML PO SUSP
30.0000 mL | ORAL | Status: DC | PRN
  Administered 2016-01-09: 30 mL via ORAL
  Filled 2016-01-09: qty 30

## 2016-01-09 NOTE — Progress Notes (Addendum)
PROGRESS NOTE    Dillon [name]  [id number] DOB: 17-Dec-1933 DOA: 01/07/2016 PCP: Daphene Calamity, MD     Brief Narrative:  Dillon [name] is a 82 y.o. male with medical history significant of CAD s/p CABG, HTN, HLD, hypothyroidism who started having productive cough of yellow sputum about 2 weeks ago.  Chest x-ray results came back and showed pneumonia as well as possible empyema. CT chest was obtained. CTS was consulted by EDP recommended thoracentesis.  Assessment & Plan:   Sepsis secondary to empyema/loculated left pleural effusion -sepsis physiology resolved -Continue rocephin/azithromax  -Blood cultures NGTD -s/p Thoracentesis 12/12: 50cc turbid fluid drained, extensive loculated collection, will request CVTS consult -FU pleural fluid cultures -will request CVTS consult, suspect will need decortication/chest tube etc  Constipation -add laxatives, stool softeners   Acute kidney injury  -Baseline Cr 1.1 -In setting of sepsis -labs pending, continue IVF today  CAD s/p CABG -Aspirin, will hold plavix due to thoracentesis and need for procedures  HTN -Continue norvasc, metoprolol  -Hold cozaar due to AKI   Hypothyroidism -Continue synthroid  HLD -Continue crestor, niacin   Possible pancreatic tail mass versus normal variant on CT -Recommend follow-up abdominal CT with contrast in near future (at time of follow up CT chest possibly)   DVT prophylaxis: subq hep Code Status: Full Family Communication: wife at bedside Disposition Plan: pending further improvement   Consultants:   CVTS  Procedures:   None  Antimicrobials:   Vanco/cefepime 12/11 >>    Subjective: Feels better, coughing up more, feels bloated and constipated   Objective: Vitals:   01/08/16 1135 01/08/16 1609 01/08/16 2139 01/09/16 0530  BP: (!) 129/48 (!) 106/56 (!) 127/50 (!) 118/55  Pulse:  86 95 82  Resp:  18 16 16   Temp:  98.7 F (37.1 C) 98.9 F (37.2 C) 97.6 F (36.4  C)  TempSrc:  Oral Oral Oral  SpO2:  91% 95% 92%  Weight:   77.3 kg (170 lb 6.4 oz)   Height:        Intake/Output Summary (Last 24 hours) at 01/09/16 0936 Last data filed at 01/09/16 [id number]  Gross per 24 hour  Intake             1820 ml  Output                0 ml  Net             1820 ml   Filed Weights   01/07/16 1231 01/07/16 2013 01/08/16 2139  Weight: 76.7 kg (169 lb) 75.8 kg (167 lb) 77.3 kg (170 lb 6.4 oz)    Examination:  General exam: Appears calm and comfortable  Respiratory system:  Diminished breath sounds on left, rhonchi diffusely. On nasal cannula O2, no conversational dyspnea and no respiratory distress. Cardiovascular system: S1 & S2 heard, RRR. No JVD, murmurs, rubs, gallops or clicks. No pedal edema. Gastrointestinal system: Abdomen is distended, soft and nontender.  Normal bowel sounds heard. Central nervous system: Alert and oriented. No focal neurological deficits. Extremities: Symmetric 5 x 5 power. Skin: No rashes, lesions or ulcers Psychiatry: Judgement and insight appear normal. Mood & affect appropriate.   Data Reviewed: I have personally reviewed following labs and imaging studies  CBC:  Recent Labs Lab 01/07/16 1244 01/08/16 0500  WBC 25.4* 23.7*  NEUTROABS 22.8*  --   HGB 10.1* 9.4*  HCT 30.1* 28.8*  MCV 93.2 95.4  PLT 516* A999333*   Basic Metabolic Panel:  Recent  Labs Lab 01/07/16 1244 01/08/16 0500  NA 132* 133*  K 4.5 4.6  CL 99* 101  CO2 23 24  GLUCOSE 175* 141*  BUN 19 19  CREATININE 1.77* 1.85*  CALCIUM 9.8 9.1   GFR: Estimated Creatinine Clearance: 30.9 mL/min (by C-G formula based on SCr of 1.85 mg/dL (H)). Liver Function Tests:  Recent Labs Lab 01/07/16 1244 01/08/16 0500  AST 46* 61*  ALT 71* 67*  ALKPHOS 148* 145*  BILITOT 0.3 0.2*  PROT 6.8 6.0*  ALBUMIN 2.3* 1.9*   No results for input(s): LIPASE, AMYLASE in the last 168 hours. No results for input(s): AMMONIA in the last 168 hours. Coagulation  Profile: No results for input(s): INR, PROTIME in the last 168 hours. Cardiac Enzymes: No results for input(s): CKTOTAL, CKMB, CKMBINDEX, TROPONINI in the last 168 hours. BNP (last 3 results) No results for input(s): PROBNP in the last 8760 hours. HbA1C: No results for input(s): HGBA1C in the last 72 hours. CBG: No results for input(s): GLUCAP in the last 168 hours. Lipid Profile: No results for input(s): CHOL, HDL, LDLCALC, TRIG, CHOLHDL, LDLDIRECT in the last 72 hours. Thyroid Function Tests: No results for input(s): TSH, T4TOTAL, FREET4, T3FREE, THYROIDAB in the last 72 hours. Anemia Panel: No results for input(s): VITAMINB12, FOLATE, FERRITIN, TIBC, IRON, RETICCTPCT in the last 72 hours. Sepsis Labs: No results for input(s): PROCALCITON, LATICACIDVEN in the last 168 hours.  Recent Results (from the past 240 hour(s))  Blood culture (routine x 2)     Status: None (Preliminary result)   Collection Time: 01/07/16  2:20 PM  Result Value Ref Range Status   Specimen Description BLOOD LEFT FOREARM  Final   Special Requests BOTTLES DRAWN AEROBIC AND ANAEROBIC 5CC  Final   Culture NO GROWTH < 24 HOURS  Final   Report Status PENDING  Incomplete  Blood culture (routine x 2)     Status: None (Preliminary result)   Collection Time: 01/07/16  3:05 PM  Result Value Ref Range Status   Specimen Description BLOOD RIGHT FOREARM  Final   Special Requests BOTTLES DRAWN AEROBIC AND ANAEROBIC 5CC  Final   Culture NO GROWTH < 24 HOURS  Final   Report Status PENDING  Incomplete  Gram stain     Status: None   Collection Time: 01/08/16 11:29 AM  Result Value Ref Range Status   Specimen Description PLEURAL LEFT  Final   Special Requests NONE  Final   Gram Stain   Final    FEW WBC PRESENT,BOTH PMN AND MONONUCLEAR NO ORGANISMS SEEN    Report Status 01/09/2016 FINAL  Final       Radiology Studies: Dg Chest 1 View  Result Date: 01/08/2016 CLINICAL DATA:  Post left thoracentesis. EXAM: CHEST 1  VIEW COMPARISON:  01/07/2016. FINDINGS: Minimal change in the loculated left pleural effusion/disease. Streaky densities left hilum may be related to atelectasis. No evidence for a pneumothorax. Right lung remains clear. Heart size is stable with median sternotomy wires present. IMPRESSION: Negative for pneumothorax following left thoracentesis. No significant change in the appearance of the left pleural disease. Electronically Signed   By: Markus Daft M.D.   On: 01/08/2016 12:04   Ct Chest Wo Contrast  Result Date: 01/07/2016 CLINICAL DATA:  Cough for several weeks. Left basilar infiltrate and loculated left pleural effusion suspicious for empyema. EXAM: CT CHEST WITHOUT CONTRAST TECHNIQUE: Multidetector CT imaging of the chest was performed following the standard protocol without IV contrast. COMPARISON:  Radiographs 01/07/2016 and  01/11/2008. FINDINGS: Cardiovascular: Status post median sternotomy and CABG. There is diffuse atherosclerosis of the aorta, great vessels and coronary arteries. Aortic valvular calcifications are present. The heart size is normal. There is no significant pericardial fluid. Mediastinum/Nodes: There is prominent subcarinal soft tissue mass consistent with adenopathy. This measures 12 mm short axis on image 29. No other enlarged mediastinal or hilar lymph nodes are identified. Hilar assessment is limited by the lack of intravenous contrast. The thyroid gland, trachea and esophagus demonstrate no significant findings. Lungs/Pleura: As seen on earlier radiographs, there is a moderate size loculated left pleural effusion. There are loculated components posterolaterally with extension into the fissure. This effusion is suboptimally characterized without contrast, but measures near water density. There is no significant pleural fluid on the right. There is a small air- fluid level at the left lung base, measuring 1.8 cm on image 100 of series 5. This is likely intraparenchymal and is  surrounded by a parenchymal opacity or mass measuring up to 5.2 x 5.6 cm on image 98. There is additional patchy airspace disease or atelectasis in the left lower lobe. The right lung is clear. Mild emphysematous changes are present. Upper abdomen: Mild hepatic steatosis. There is a calcified 12 mm gallstone in the gallbladder neck. No gallbladder wall thickening or surrounding inflammation. No evidence of adrenal mass. There is some lobularity of the pancreatic tail which measures up to 3.9 cm on image 58. Musculoskeletal/Chest wall: There is no chest wall mass or suspicious osseous finding. Old healed rib fractures noted on the left. IMPRESSION: 1. Moderate size loculated left pleural effusion could reflect empyema in the appropriate clinical context. This is incompletely characterized by this noncontrast study. Consider thoracentesis. 2. Patchy left lower lobe airspace disease with possible cavitary pneumonia or mass inferiorly. Follow-up imaging post treatment for pneumonia recommended. 3. Diffuse atherosclerosis post CABG. Aortic valvular calcifications. 4. Cholelithiasis with possible pancreatic tail mass versus normal variant. Recommend follow-up abdominal CT at the time of chest CT follow-up (preferentially with intravenous contrast if possible). Electronically Signed   By: Richardean Sale M.D.   On: 01/07/2016 16:05   US Thoracentesis Asp Pleural Space W/img Guide  Result Date: 01/08/2016 INDICATION: Patient with history of coronary artery disease and prior CABG, leukocytosis, dyspnea, cough, loculated left pleural effusion. Request made for diagnostic and therapeutic left thoracentesis. EXAM: ULTRASOUND GUIDED DIAGNOSTIC AND THERAPEUTIC LEFT THORACENTESIS MEDICATIONS: None. COMPLICATIONS: None immediate. PROCEDURE: An ultrasound guided thoracentesis was thoroughly discussed with the patient and questions answered. The benefits, risks, alternatives and complications were also discussed. The patient  understands and wishes to proceed with the procedure. Written consent was obtained. Ultrasound was performed to localize and mark an adequate pocket of fluid in the left chest. The area was then prepped and draped in the normal sterile fashion. 1% Lidocaine was used for local anesthesia. Under ultrasound guidance a Safe-T-Centesis catheter was introduced. Thoracentesis was performed. The catheter was removed and a dressing applied. FINDINGS: A total of approximately 50 cc of turbid, amber fluid was removed. Samples were sent to the laboratory as requested by the clinical team. Due to the extensive multiloculated nature of the pleural collection only the above amount of fluid could be removed at this time. IMPRESSION: Successful ultrasound guided diagnostic and therapeutic left thoracentesis yielding 50 cc of pleural fluid. If patient's symptoms/ clinical condition worsens, consider follow-up CT chest and TCTS consultation. Read by: Rowe Robert, PA-C Electronically Signed   By: Marybelle Killings M.D.   On: 01/08/2016 11:59  Scheduled Meds: . amLODipine  5 mg Oral QHS  . aspirin EC  81 mg Oral QHS  . azithromycin  500 mg Intravenous Q24H  . cefTRIAXone (ROCEPHIN)  IV  1 g Intravenous Q24H  . heparin  5,000 Units Subcutaneous Q8H  . levothyroxine  112 mcg Oral QAC breakfast  . metoprolol succinate  50 mg Oral Daily  . niacin  1,000 mg Oral QHS  . rosuvastatin  40 mg Oral QHS  . sodium chloride flush  3 mL Intravenous Q12H   Continuous Infusions: . sodium chloride 100 mL/hr at 01/08/16 2327     LOS: 2 days    Time spent: 30 minutes   Domenic Polite, MD [facility] [email or web address] Password [facility] 01/09/2016, 9:36 AM

## 2016-01-09 NOTE — Consult Note (Signed)
[address]       [place] [address]             [phone]        Dacotah Daye [facility] Medical Record [id number] Date of Birth: 12/06/1933  Referring: [facility], Dr. Jacinta Shoe Primary Care: Daphene Calamity, MD  Chief Complaint:    Chief Complaint  Patient presents with  . Cough  Patient examined, CT scan of chest personally reviewed and counseled with wife and patient  History of Present Illness:     82 year old Caucasian male reformed smoker status post CABG in 2009 with normal LV function [EF 60% on echo earlier this year] admitted with 2 weeks of productive cough, malaise, decreased appetite, weakness and shortness of breath. He was evaluated in the emergency department and found to have marginal oxygen saturation. Chest x-ray showed a left lower lobe infiltrate with effusion. CT scan showed a loculated effusion consistent with empyema. Thoracentesis returned only 50 mL's of xanthochromic fluid with no bacteria and a few monocytes. Cytology shows no malignant cells with acute inflammation and empyema. Cultures are pending and the patient is on IV azithromycin and ceftriaxone. White count is 22,000 and he is afebrile. Follow-up chest x-ray after thoracentesis showed little difference in the large loculated pleural collection and thoracic surgical evaluation is requested for VATS.   Current Activity/ Functional Status: Patient is retired and lives with his wife Functional status has declined along with the development of this acute respiratory illness. No falls or altered mental status   Zubrod Score: At the time of surgery this patient's most appropriate activity status/level should be described as: []     0    Normal activity, no symptoms []     1    Restricted in physical strenuous activity but ambulatory, able to do out light work []     2    Ambulatory and capable of self care, unable to do work activities, up and about                  more than 50%  Of the time                            [x]     3    Only limited self care, in bed greater than 50% of waking hours []     4    Completely disabled, no self care, confined to bed or chair []     5    Moribund  Past Medical History:  Diagnosis Date  . CAD (coronary artery disease)   . S/P CABG x 3 12/17/07   LIMA to LAD,SVG to left C    Past Surgical History:  Procedure Laterality Date  . CORONARY ARTERY BYPASS GRAFT  12/17/07   LIMA to LAD,vein to obtuse marginal,vein to RCA    History  Smoking Status  . Former Smoker  . Types: Cigarettes  . Quit date: 01/28/1971  Smokeless Tobacco  . Never Used    History  Alcohol Use  . Yes    Comment: socially    Social History   Social History  . Marital status: Married    Spouse name: N/A  . Number of children: N/A  . Years of education: N/A   Occupational History  . Not on file.   Social History Main Topics  . Smoking status: Former Smoker    Types: Cigarettes  Quit date: 01/28/1971  . Smokeless tobacco: Never Used  . Alcohol use Yes     Comment: socially  . Drug use: No  . Sexual activity: Not on file   Other Topics Concern  . Not on file   Social History Narrative  . No narrative on file    No Known Allergies  Current Facility-Administered Medications  Medication Dose Route Frequency Provider Last Rate Last Dose  . 0.9 %  sodium chloride infusion   Intravenous Continuous Shon Millet, DO 100 mL/hr at 01/09/16 1113    . alum & mag hydroxide-simeth (MAALOX/MYLANTA) 200-200-20 MG/5ML suspension 30 mL  30 mL Oral Q4H PRN Shon Millet, DO   30 mL at 01/09/16 0530  . amLODipine (NORVASC) tablet 5 mg  5 mg Oral QHS Shon Millet, DO   5 mg at 01/08/16 2251  . aspirin EC tablet 81 mg  81 mg Oral QHS Shon Millet, DO   81 mg at 01/08/16 2252  . azithromycin (ZITHROMAX) 500 mg in dextrose 5 % 250 mL IVPB  500 mg Intravenous Q24H Shon Millet, DO    500 mg at 01/09/16 1533  . cefTRIAXone (ROCEPHIN) 1 g in dextrose 5 % 50 mL IVPB  1 g Intravenous Q24H Shon Millet, DO   1 g at 01/09/16 1438  . docusate sodium (COLACE) capsule 100 mg  100 mg Oral BID PRN Shon Millet, DO   100 mg at 01/08/16 2307  . heparin injection 5,000 Units  5,000 Units Subcutaneous Q8H Jennifer Chahn-Yang Choi, DO   5,000 Units at 01/09/16 1438  . levothyroxine (SYNTHROID, LEVOTHROID) tablet 112 mcg  112 mcg Oral QAC breakfast Shon Millet, DO   112 mcg at 01/09/16 0630  . metoprolol succinate (TOPROL-XL) 24 hr tablet 50 mg  50 mg Oral Daily Shon Millet, DO   50 mg at 01/09/16 1113  . niacin (NIASPAN) CR tablet 1,000 mg  1,000 mg Oral QHS Shon Millet, DO   1,000 mg at 01/08/16 2253  . polyethylene glycol (MIRALAX / GLYCOLAX) packet 17 g  17 g Oral Daily Domenic Polite, MD   17 g at 01/09/16 1113  . rosuvastatin (CRESTOR) tablet 40 mg  40 mg Oral QHS Shon Millet, DO   40 mg at 01/08/16 2254  . sodium chloride flush (NS) 0.9 % injection 3 mL  3 mL Intravenous Q12H Shon Millet, DO   10 mL at 01/08/16 2255    Prescriptions Prior to Admission  Medication Sig Dispense Refill Last Dose  . amLODipine (NORVASC) 5 MG tablet Take 1 tablet (5 mg total) by mouth daily. (Patient taking differently: Take 5 mg by mouth at bedtime. ) 90 tablet 3 01/06/2016 at Unknown time  . aspirin EC 81 MG tablet Take 81 mg by mouth at bedtime.    01/06/2016 at Unknown time  . Cholecalciferol (VITAMIN D) 2000 units tablet Take 2,000 Units by mouth at bedtime.   01/06/2016 at Unknown time  . clopidogrel (PLAVIX) 75 MG tablet Take 1 tablet (75 mg total) by mouth daily. 90 tablet 3 01/07/2016 at Unknown time  . fexofenadine (ALLEGRA) 180 MG tablet Take 180 mg by mouth daily.   01/07/2016 at Unknown time  . folic acid (FOLVITE) 1 MG tablet TAKE 1 TABLET EVERY DAY 90 tablet 1 01/07/2016 at Unknown time  . levothyroxine  (SYNTHROID, LEVOTHROID) 112 MCG tablet Take 112 mcg by mouth daily.   01/07/2016 at Unknown time  .  losartan (COZAAR) 100 MG tablet Take 1 tablet (100 mg total) by mouth daily. 90 tablet 3 01/07/2016 at Unknown time  . metoprolol succinate (TOPROL-XL) 50 MG 24 hr tablet TAKE 1 TABLET (50 MG TOTAL) BY MOUTH DAILY. 90 tablet 3 01/07/2016 at 700  . mupirocin ointment (BACTROBAN) 2 % Apply 1 application topically daily as needed (wound care).    3-4 weeks ago  . naproxen sodium (ALEVE) 220 MG tablet Take 440 mg by mouth daily as needed (pain).   couple weeks ago  . niacin (NIASPAN) 1000 MG CR tablet TAKE 1 TABLET AT BEDTIME 90 tablet 1 01/06/2016 at Unknown time  . rosuvastatin (CRESTOR) 40 MG tablet Take 1 tablet (40 mg total) by mouth daily. (Patient taking differently: Take 40 mg by mouth at bedtime. ) 90 tablet 3 01/06/2016 at Unknown time  . terbinafine (LAMISIL) 1 % cream Apply 1 application topically daily as needed (apply as directed after acid bath treatment).   01/06/2016 at Unknown time    Family History  Problem Relation Age of Onset  . Heart attack Father      Review of Systems:       Cardiac Review of Systems: Y or N  Chest Pain [  Left side with minimal discomfort  ]  Resting SOB [ yes  ] Exertional SOB  [  ]  Orthopnea [  ]   Pedal Edema [   ]    Palpitations [  ] Syncope  [  ]   Presyncope [   ] status post CABG with left IMA  General Review of Systems: [Y] = yes [  ]=no Constitional: recent weight change [  ]; anorexia [ yes ]; fatigue [ yes ]; nausea [  ]; night sweats [  ]; fever [  ]; or chills [  ]                                                               Dental: poor dentition[  ]; Last Dentist visit:   Eye : blurred vision [  ]; diplopia [   ]; vision changes [  ];  Amaurosis fugax[  ]; Resp: cough [yes productive  ];  wheezing[  ];  hemoptysis[  ]; shortness of breath[  ]; paroxysmal nocturnal dyspnea[  ]; dyspnea on exertion[  ]; or orthopnea[  ];  GI:   gallstones[  ], vomiting[  ];  dysphagia[  ]; melena[  ];  hematochezia [  ]; heartburn[  ];   Hx of  Colonoscopy[  ]; GU: kidney stones [  ]; hematuria[  ];   dysuria [  ];  nocturia[  ];  history of     obstruction [  ]; urinary frequency [ yes ]             Skin: rash, swelling[  ];, hair loss[  ];  peripheral edema[  ];  or itching[  ]; Musculosketetal: myalgias[  ];  joint swelling[  ];  joint erythema[  ];  joint pain[  ];  back pain[ yes  ];  Heme/Lymph: bruising[  ];  bleeding[  ];  anemia[  ];  Neuro: TIA[  ];  headaches[  ];  stroke[  ];  vertigo[  ];  seizures[  ];  paresthesias[  ];  difficulty walking[  ];  Psych:depression[  ]; anxiety[  ];  Endocrine: diabetes[  ];  thyroid dysfunction[  ];  Immunizations: Flu [  ]; Pneumococcal[  ];  Other:  Physical Exam: BP (!) 126/47 (BP Location: Right Arm)   Pulse 88   Temp 98.7 F (37.1 C) (Oral)   Resp 17   Ht 5' 5.5" (1.664 m)   Wt 170 lb 6.4 oz (77.3 kg)   SpO2 93%   BMI 27.92 kg/m        Physical Exam  General: Overweight elderly Caucasian male appears acutely ill HEENT: Normocephalic pupils equal , dentition adequate Neck: Supple without JVD, adenopathy, or bruit Chest: Decreased breath sounds on the left normal clear breath sounds on the right, no rhonchi, no tenderness             or deformity Cardiovascular: Regular rate and rhythm, no murmur, no gallop, peripheral pulses             palpable in all extremities Abdomen:  Soft, nontender, no palpable mass or organomegaly Extremities: Warm, well-perfused, no clubbing cyanosis edema or tenderness,              no venous stasis changes of the legs Rectal/GU: Deferred Neuro: Grossly non--focal and symmetrical throughout Skin: Clean and dry without rash or ulceration   Diagnostic Studies & Laboratory data:     Recent Radiology Findings:  CT scan shows large left loculated effusion at the left lower pleural space   Dg Chest 1 View  Result Date:  01/08/2016 CLINICAL DATA:  Post left thoracentesis. EXAM: CHEST 1 VIEW COMPARISON:  01/07/2016. FINDINGS: Minimal change in the loculated left pleural effusion/disease. Streaky densities left hilum may be related to atelectasis. No evidence for a pneumothorax. Right lung remains clear. Heart size is stable with median sternotomy wires present. IMPRESSION: Negative for pneumothorax following left thoracentesis. No significant change in the appearance of the left pleural disease. Electronically Signed   By: Markus Daft M.D.   On: 01/08/2016 12:04   US Thoracentesis Asp Pleural Space W/img Guide  Result Date: 01/08/2016 INDICATION: Patient with history of coronary artery disease and prior CABG, leukocytosis, dyspnea, cough, loculated left pleural effusion. Request made for diagnostic and therapeutic left thoracentesis. EXAM: ULTRASOUND GUIDED DIAGNOSTIC AND THERAPEUTIC LEFT THORACENTESIS MEDICATIONS: None. COMPLICATIONS: None immediate. PROCEDURE: An ultrasound guided thoracentesis was thoroughly discussed with the patient and questions answered. The benefits, risks, alternatives and complications were also discussed. The patient understands and wishes to proceed with the procedure. Written consent was obtained. Ultrasound was performed to localize and mark an adequate pocket of fluid in the left chest. The area was then prepped and draped in the normal sterile fashion. 1% Lidocaine was used for local anesthesia. Under ultrasound guidance a Safe-T-Centesis catheter was introduced. Thoracentesis was performed. The catheter was removed and a dressing applied. FINDINGS: A total of approximately 50 cc of turbid, amber fluid was removed. Samples were sent to the laboratory as requested by the clinical team. Due to the extensive multiloculated nature of the pleural collection only the above amount of fluid could be removed at this time. IMPRESSION: Successful ultrasound guided diagnostic and therapeutic left  thoracentesis yielding 50 cc of pleural fluid. If patient's symptoms/ clinical condition worsens, consider follow-up CT chest and TCTS consultation. Read by: Rowe Robert, PA-C Electronically Signed   By: Marybelle Killings M.D.   On: 01/08/2016 11:59     I have independently reviewed the above  radiologic studies.  Recent Lab Findings: Lab Results  Component Value Date   WBC 22.9 (H) 01/09/2016   HGB 9.5 (L) 01/09/2016   HCT 28.8 (L) 01/09/2016   PLT 500 (H) 01/09/2016   GLUCOSE 244 (H) 01/09/2016   CHOL 145 01/18/2014   TRIG 70 01/18/2014   HDL 64 01/18/2014   LDLCALC 67 01/18/2014   ALT 67 (H) 01/08/2016   AST 61 (H) 01/08/2016   NA 134 (L) 01/09/2016   K 4.0 01/09/2016   CL 105 01/09/2016   CREATININE 1.59 (H) 01/09/2016   BUN 16 01/09/2016   CO2 20 (L) 01/09/2016   TSH 4.513 (H) 01/18/2014   INR 1.5 12/17/2007      Assessment / Plan:     Left lower lobe pneumonia with large loculated empyema   Previous CABG with normal LV systolic function   Severe malnutrition with low protein, weight loss, prealbumin  Pending   Chronic renal insufficiency   The patient will need left VATS drainage of empyema and decortication left lower lobe Surgery be scheduled for Friday, December 15. I discussed the indications and risks of the procedure as well as the alternatives with the patient and his wife. They agree to proceed.      @ME1 @ 01/09/2016 5:57 PM

## 2016-01-10 LAB — CBC WITH DIFFERENTIAL/PLATELET
BASOS ABS: 0 10*3/uL (ref 0.0–0.1)
Basophils Relative: 0 %
EOS ABS: 0.1 10*3/uL (ref 0.0–0.7)
EOS PCT: 0 %
HCT: 28.3 % — ABNORMAL LOW (ref 39.0–52.0)
HEMOGLOBIN: 9.2 g/dL — AB (ref 13.0–17.0)
LYMPHS ABS: 1.2 10*3/uL (ref 0.7–4.0)
Lymphocytes Relative: 7 %
MCH: 30.9 pg (ref 26.0–34.0)
MCHC: 32.5 g/dL (ref 30.0–36.0)
MCV: 95 fL (ref 78.0–100.0)
Monocytes Absolute: 0.9 10*3/uL (ref 0.1–1.0)
Monocytes Relative: 5 %
NEUTROS PCT: 88 %
Neutro Abs: 16 10*3/uL — ABNORMAL HIGH (ref 1.7–7.7)
PLATELETS: 455 10*3/uL — AB (ref 150–400)
RBC: 2.98 MIL/uL — AB (ref 4.22–5.81)
RDW: 14.4 % (ref 11.5–15.5)
WBC: 18.1 10*3/uL — AB (ref 4.0–10.5)

## 2016-01-10 LAB — BASIC METABOLIC PANEL
ANION GAP: 9 (ref 5–15)
BUN: 15 mg/dL (ref 6–20)
CHLORIDE: 102 mmol/L (ref 101–111)
CO2: 22 mmol/L (ref 22–32)
Calcium: 8.6 mg/dL — ABNORMAL LOW (ref 8.9–10.3)
Creatinine, Ser: 1.5 mg/dL — ABNORMAL HIGH (ref 0.61–1.24)
GFR, EST AFRICAN AMERICAN: 49 mL/min — AB (ref >60)
GFR, EST NON AFRICAN AMERICAN: 42 mL/min — AB (ref >60)
Glucose, Bld: 156 mg/dL — ABNORMAL HIGH (ref 65–99)
POTASSIUM: 3.9 mmol/L (ref 3.5–5.1)
SODIUM: 133 mmol/L — AB (ref 135–145)

## 2016-01-10 LAB — CBC
HCT: 31.2 % — ABNORMAL LOW (ref 39.0–52.0)
Hemoglobin: 10.3 g/dL — ABNORMAL LOW (ref 13.0–17.0)
MCH: 31.4 pg (ref 26.0–34.0)
MCHC: 33 g/dL (ref 30.0–36.0)
MCV: 95.1 fL (ref 78.0–100.0)
Platelets: 531 10*3/uL — ABNORMAL HIGH (ref 150–400)
RBC: 3.28 MIL/uL — ABNORMAL LOW (ref 4.22–5.81)
RDW: 14.5 % (ref 11.5–15.5)
WBC: 23.8 10*3/uL — ABNORMAL HIGH (ref 4.0–10.5)

## 2016-01-10 LAB — COMPREHENSIVE METABOLIC PANEL
ALT: 101 U/L — ABNORMAL HIGH (ref 17–63)
AST: 107 U/L — ABNORMAL HIGH (ref 15–41)
Albumin: 2 g/dL — ABNORMAL LOW (ref 3.5–5.0)
Alkaline Phosphatase: 188 U/L — ABNORMAL HIGH (ref 38–126)
Anion gap: 8 (ref 5–15)
BUN: 13 mg/dL (ref 6–20)
CO2: 23 mmol/L (ref 22–32)
Calcium: 9.2 mg/dL (ref 8.9–10.3)
Chloride: 103 mmol/L (ref 101–111)
Creatinine, Ser: 1.52 mg/dL — ABNORMAL HIGH (ref 0.61–1.24)
GFR calc Af Amer: 48 mL/min — ABNORMAL LOW (ref >60)
GFR calc non Af Amer: 42 mL/min — ABNORMAL LOW (ref >60)
Glucose, Bld: 266 mg/dL — ABNORMAL HIGH (ref 65–99)
Potassium: 4.3 mmol/L (ref 3.5–5.1)
Sodium: 134 mmol/L — ABNORMAL LOW (ref 135–145)
Total Bilirubin: 0.3 mg/dL (ref 0.3–1.2)
Total Protein: 6.7 g/dL (ref 6.5–8.1)

## 2016-01-10 LAB — URINALYSIS, ROUTINE W REFLEX MICROSCOPIC
Bacteria, UA: NONE SEEN
Bilirubin Urine: NEGATIVE
Glucose, UA: >=500 mg/dL — AB
Ketones, ur: NEGATIVE mg/dL
Leukocytes, UA: NEGATIVE
Nitrite: NEGATIVE
Protein, ur: 100 mg/dL — AB
Specific Gravity, Urine: 1.014 (ref 1.005–1.030)
pH: 6 (ref 5.0–8.0)

## 2016-01-10 LAB — BLOOD GAS, ARTERIAL
Acid-base deficit: 2.7 mmol/L — ABNORMAL HIGH (ref 0.0–2.0)
Bicarbonate: 21 mmol/L (ref 20.0–28.0)
Drawn by: 25203
FIO2: 21
O2 Saturation: 92 %
Patient temperature: 98.6
pCO2 arterial: 32.3 mmHg (ref 32.0–48.0)
pH, Arterial: 7.428 (ref 7.350–7.450)
pO2, Arterial: 62.7 mmHg — ABNORMAL LOW (ref 83.0–108.0)

## 2016-01-10 LAB — SURGICAL PCR SCREEN
MRSA, PCR: NEGATIVE
Staphylococcus aureus: NEGATIVE

## 2016-01-10 LAB — PLATELET INHIBITION P2Y12: Platelet Function  P2Y12: 340 [PRU] (ref 194–418)

## 2016-01-10 LAB — PREALBUMIN: Prealbumin: <5 mg/dL — ABNORMAL LOW (ref 18–38)

## 2016-01-10 LAB — PROTIME-INR
INR: 1.23
Prothrombin Time: 15.6 seconds — ABNORMAL HIGH (ref 11.4–15.2)

## 2016-01-10 LAB — PREPARE RBC (CROSSMATCH)

## 2016-01-10 LAB — APTT: aPTT: 34 seconds (ref 24–36)

## 2016-01-10 LAB — TSH: TSH: 4.322 u[IU]/mL (ref 0.350–4.500)

## 2016-01-10 MED ORDER — DEXTROSE 5 % IV SOLN
1.5000 g | INTRAVENOUS | Status: AC
  Administered 2016-01-11: 1.5 g via INTRAVENOUS

## 2016-01-10 MED ORDER — ENSURE ENLIVE PO LIQD
237.0000 mL | Freq: Three times a day (TID) | ORAL | Status: DC
  Administered 2016-01-10: 237 mL via ORAL

## 2016-01-10 MED ORDER — BISACODYL 10 MG RE SUPP: 10.0000 mg | Freq: Every day | RECTAL | Status: DC | PRN

## 2016-01-10 NOTE — Progress Notes (Signed)
PROGRESS NOTE    Dillon [name]  [id number] DOB: 05-06-1933 DOA: 01/07/2016 PCP: Daphene Calamity, MD     Brief Narrative:  Dillon [name] is a 81 y.o. male with medical history significant of CAD s/p CABG, HTN, HLD, hypothyroidism who started having productive cough of yellow sputum about 2 weeks ago.  Chest x-ray results came back and showed pneumonia as well as possible empyema. CT chest was obtained. CTS was consulted by EDP recommended thoracentesis.  Assessment & Plan:   Sepsis secondary to empyema/loculated left pleural effusion -sepsis physiology resolved -Continue rocephin/azithromax Day 4/10 -Blood cultures NGTD -s/p Thoracentesis 12/12: 50cc turbid fluid drained, extensive loculated collection,  -FU pleural fluid cultures-No growth x1day -appreciate CVTS consult per Dr.Van Kerby Less, plan for VATs with decortication on Friday  Constipation -+Bm yesterday, continue miralax, stool softeners   Acute kidney injury  -Baseline Cr 1.1 -In setting of sepsis Resolved, stop IVF  CAD s/p CABG -Aspirin, will hold plavix due to thoracentesis and need for procedures  HTN -Continue norvasc, metoprolol  -Hold cozaar due to AKI   Hypothyroidism -Continue synthroid  HLD -Continue crestor, niacin   Possible pancreatic tail mass versus normal variant on CT -Recommend follow-up abdominal CT with contrast in near future (at time of follow up CT chest possibly)   DVT prophylaxis: subq hep Code Status: Full Family Communication: wife at bedside Disposition Plan: needs VATs decortication   Consultants:   CVTS  Procedures:   None  Antimicrobials:   Vanco/cefepime 12/11 >>    Subjective: Feels better, coughing up less, + BM   Objective: Vitals:   01/09/16 1812 01/09/16 2040 01/10/16 0600 01/10/16 0759  BP: (!) 123/54 (!) 130/59 (!) 137/52 (!) 124/54  Pulse: 87 81 71 90  Resp: 18 18 18 18   Temp: 98.8 F (37.1 C) 98.8 F (37.1 C) 98.6 F (37 C) 98 F  (36.7 C)  TempSrc: Oral Oral Oral Oral  SpO2: 96% 96% 95% 96%  Weight:  78.3 kg (172 lb 9.6 oz)    Height:        Intake/Output Summary (Last 24 hours) at 01/10/16 1422 Last data filed at 01/10/16 1400  Gross per 24 hour  Intake             4940 ml  Output                0 ml  Net             4940 ml   Filed Weights   01/07/16 2013 01/08/16 2139 01/09/16 2040  Weight: 75.8 kg (167 lb) 77.3 kg (170 lb 6.4 oz) 78.3 kg (172 lb 9.6 oz)    Examination:  General exam: Appears calm and comfortable, AAOx3, no distress Respiratory system:  Diminished breath sounds on left, rhonchi diffusely. On nasal cannula O2, no conversational dyspnea and no respiratory distress. Cardiovascular system: S1 & S2 heard, RRR. No JVD, murmurs, rubs, gallops or clicks. No pedal edema. Gastrointestinal system: Abdomen is distended, soft and nontender.  Normal bowel sounds heard. Central nervous system: Alert and oriented. No focal neurological deficits. Extremities: Symmetric 5 x 5 power. Skin: No rashes, lesions or ulcers Psychiatry: Judgement and insight appear normal. Mood & affect appropriate.   Data Reviewed: I have personally reviewed following labs and imaging studies  CBC:  Recent Labs Lab 01/07/16 1244 01/08/16 0500 01/09/16 0940 01/10/16 0354 01/10/16 0916  WBC 25.4* 23.7* 22.9* 18.1* 23.8*  NEUTROABS 22.8*  --  20.4* 16.0*  --  HGB 10.1* 9.4* 9.5* 9.2* 10.3*  HCT 30.1* 28.8* 28.8* 28.3* 31.2*  MCV 93.2 95.4 94.4 95.0 95.1  PLT 516* 457* 500* 455* 0000000*   Basic Metabolic Panel:  Recent Labs Lab 01/07/16 1244 01/08/16 0500 01/09/16 0940 01/10/16 0354 01/10/16 0916  NA 132* 133* 134* 133* 134*  K 4.5 4.6 4.0 3.9 4.3  CL 99* 101 105 102 103  CO2 23 24 20* 22 23  GLUCOSE 175* 141* 244* 156* 266*  BUN 19 19 16 15 13   CREATININE 1.77* 1.85* 1.59* 1.50* 1.52*  CALCIUM 9.8 9.1 8.9 8.6* 9.2   GFR: Estimated Creatinine Clearance: 37.8 mL/min (by C-G formula based on SCr of 1.52  mg/dL (H)). Liver Function Tests:  Recent Labs Lab 01/07/16 1244 01/08/16 0500 01/10/16 0916  AST 46* 61* 107*  ALT 71* 67* 101*  ALKPHOS 148* 145* 188*  BILITOT 0.3 0.2* 0.3  PROT 6.8 6.0* 6.7  ALBUMIN 2.3* 1.9* 2.0*   No results for input(s): LIPASE, AMYLASE in the last 168 hours. No results for input(s): AMMONIA in the last 168 hours. Coagulation Profile:  Recent Labs Lab 01/10/16 0559  INR 1.23   Cardiac Enzymes: No results for input(s): CKTOTAL, CKMB, CKMBINDEX, TROPONINI in the last 168 hours. BNP (last 3 results) No results for input(s): PROBNP in the last 8760 hours. HbA1C: No results for input(s): HGBA1C in the last 72 hours. CBG: No results for input(s): GLUCAP in the last 168 hours. Lipid Profile: No results for input(s): CHOL, HDL, LDLCALC, TRIG, CHOLHDL, LDLDIRECT in the last 72 hours. Thyroid Function Tests:  Recent Labs  01/10/16 0559  TSH 4.322   Anemia Panel: No results for input(s): VITAMINB12, FOLATE, FERRITIN, TIBC, IRON, RETICCTPCT in the last 72 hours. Sepsis Labs: No results for input(s): PROCALCITON, LATICACIDVEN in the last 168 hours.  Recent Results (from the past 240 hour(s))  Blood culture (routine x 2)     Status: None (Preliminary result)   Collection Time: 01/07/16  2:20 PM  Result Value Ref Range Status   Specimen Description BLOOD LEFT FOREARM  Final   Special Requests BOTTLES DRAWN AEROBIC AND ANAEROBIC 5CC  Final   Culture NO GROWTH 2 DAYS  Final   Report Status PENDING  Incomplete  Blood culture (routine x 2)     Status: None (Preliminary result)   Collection Time: 01/07/16  3:05 PM  Result Value Ref Range Status   Specimen Description BLOOD RIGHT FOREARM  Final   Special Requests BOTTLES DRAWN AEROBIC AND ANAEROBIC 5CC  Final   Culture NO GROWTH 2 DAYS  Final   Report Status PENDING  Incomplete  Culture, body fluid-bottle     Status: None (Preliminary result)   Collection Time: 01/08/16 11:29 AM  Result Value Ref  Range Status   Specimen Description PLEURAL LEFT  Final   Special Requests NONE  Final   Culture NO GROWTH 1 DAY  Final   Report Status PENDING  Incomplete  Gram stain     Status: None   Collection Time: 01/08/16 11:29 AM  Result Value Ref Range Status   Specimen Description PLEURAL LEFT  Final   Special Requests NONE  Final   Gram Stain   Final    FEW WBC PRESENT,BOTH PMN AND MONONUCLEAR NO ORGANISMS SEEN    Report Status 01/09/2016 FINAL  Final  Surgical pcr screen     Status: None   Collection Time: 01/10/16  6:02 AM  Result Value Ref Range Status   MRSA, PCR  NEGATIVE NEGATIVE Final   Staphylococcus aureus NEGATIVE NEGATIVE Final    Comment:        The Xpert SA Assay (FDA approved for NASAL specimens in patients over 11 years of age), is one component of a comprehensive surveillance program.  Test performance has been validated by [facility] for patients greater than or equal to 43 year old. It is not intended to diagnose infection nor to guide or monitor treatment.        Radiology Studies: No results found.    Scheduled Meds: . amLODipine  5 mg Oral QHS  . aspirin EC  81 mg Oral QHS  . azithromycin  500 mg Intravenous Q24H  . cefTRIAXone (ROCEPHIN)  IV  1 g Intravenous Q24H  . [START ON 01/11/2016] cefUROXime (ZINACEF)  IV  1.5 g Intravenous 60 min Pre-Op  . feeding supplement (ENSURE ENLIVE)  237 mL Oral TID WC  . heparin  5,000 Units Subcutaneous Q8H  . levothyroxine  112 mcg Oral QAC breakfast  . metoprolol succinate  50 mg Oral Daily  . niacin  1,000 mg Oral QHS  . polyethylene glycol  17 g Oral Daily  . rosuvastatin  40 mg Oral QHS  . sodium chloride flush  3 mL Intravenous Q12H   Continuous Infusions:    LOS: 3 days    Time spent: 30 minutes   Domenic Polite, MD [facility] [email or web address] Password [email or web address] 01/10/2016, 2:22 PM

## 2016-01-10 NOTE — Anesthesia Preprocedure Evaluation (Addendum)
Anesthesia Evaluation  Patient identified by MRN, date of birth, ID band Patient awake    Reviewed: Allergy & Precautions, NPO status , Patient's Chart, lab work & pertinent test results  History of Anesthesia Complications Negative for: history of anesthetic complications  Airway Mallampati: II  TM Distance: >3 FB Neck ROM: Full    Dental  (+) Poor Dentition, Missing, Dental Advisory Given, Chipped   Pulmonary COPD, former smoker,  L pleural effusion/empyema   breath sounds clear to auscultation       Cardiovascular hypertension, Pt. on medications and Pt. on home beta blockers (-) angina+ CAD and + CABG   Rhythm:Regular Rate:Normal  7/17 ECHO: EF 60-65%, mild AS, mild MR   Neuro/Psych negative neurological ROS     GI/Hepatic negative GI ROS, Neg liver ROS,   Endo/Other  Hypothyroidism   Renal/GU Renal InsufficiencyRenal disease (creat 1.52)     Musculoskeletal   Abdominal (+) - obese,   Peds  Hematology  (+) Blood dyscrasia (Hb 10.3), anemia ,   Anesthesia Other Findings   Reproductive/Obstetrics                            Anesthesia Physical Anesthesia Plan  ASA: III  Anesthesia Plan: General   Post-op Pain Management:    Induction: Intravenous  Airway Management Planned: Double Lumen EBT and Oral ETT  Additional Equipment: Arterial line and CVP  Intra-op Plan:   Post-operative Plan: Possible Post-op intubation/ventilation  Informed Consent: I have reviewed the patients History and Physical, chart, labs and discussed the procedure including the risks, benefits and alternatives for the proposed anesthesia with the patient or authorized representative who has indicated his/her understanding and acceptance.   Dental advisory given  Plan Discussed with: CRNA and Surgeon  Anesthesia Plan Comments: (Plan routine monitors, A line, Central access, GETA with DLT.  Possible post  op ventilation)        Anesthesia Quick Evaluation

## 2016-01-11 ENCOUNTER — Encounter (HOSPITAL_COMMUNITY)
Admission: EM | Disposition: A | Discharge status: Home / Self Care | Payer: Self-pay | Source: Home / Self Care | Attending: Internal Medicine

## 2016-01-11 ENCOUNTER — Inpatient Hospital Stay (HOSPITAL_COMMUNITY): Payer: Medicare Other

## 2016-01-11 ENCOUNTER — Inpatient Hospital Stay (HOSPITAL_COMMUNITY): Payer: Medicare Other | Admitting: Anesthesiology

## 2016-01-11 DIAGNOSIS — J869 Pyothorax without fistula: Secondary | ICD-10-CM

## 2016-01-11 HISTORY — PX: VIDEO ASSISTED THORACOSCOPY (VATS)/DECORTICATION: SHX6171

## 2016-01-11 HISTORY — PX: EMPYEMA DRAINAGE: SHX5097

## 2016-01-11 LAB — GLUCOSE, CAPILLARY
Glucose-Capillary: 197 mg/dL — ABNORMAL HIGH (ref 65–99)
Glucose-Capillary: 166 mg/dL — ABNORMAL HIGH (ref 65–99)
Glucose-Capillary: 165 mg/dL — ABNORMAL HIGH (ref 65–99)

## 2016-01-11 LAB — HEMOGLOBIN A1C
Hgb A1c MFr Bld: 7.2 % — ABNORMAL HIGH (ref 4.8–5.6)
Mean Plasma Glucose: 160 mg/dL

## 2016-01-11 SURGERY — VIDEO ASSISTED THORACOSCOPY (VATS)/DECORTICATION
Anesthesia: General | Site: Chest | Laterality: Left

## 2016-01-11 MED ORDER — BUPIVACAINE HCL (PF) 0.5 % IJ SOLN
INTRAMUSCULAR | Status: DC | PRN
  Administered 2016-01-11: 30 mL

## 2016-01-11 MED ORDER — FENTANYL 40 MCG/ML IV SOLN
INTRAVENOUS | Status: AC
  Filled 2016-01-11: qty 25

## 2016-01-11 MED ORDER — MIDAZOLAM HCL 5 MG/5ML IJ SOLN
INTRAMUSCULAR | Status: DC | PRN
  Administered 2016-01-11: 1 mg via INTRAVENOUS

## 2016-01-11 MED ORDER — PIPERACILLIN-TAZOBACTAM 3.375 G IVPB
3.3750 g | Freq: Three times a day (TID) | INTRAVENOUS | Status: DC
  Administered 2016-01-11 – 2016-01-16 (×15): 3.375 g via INTRAVENOUS
  Filled 2016-01-11 (×16): qty 50

## 2016-01-11 MED ORDER — ALBUTEROL SULFATE HFA 108 (90 BASE) MCG/ACT IN AERS
INHALATION_SPRAY | RESPIRATORY_TRACT | Status: DC | PRN
  Administered 2016-01-11 (×2): 2 via RESPIRATORY_TRACT

## 2016-01-11 MED ORDER — DIPHENHYDRAMINE HCL 50 MG/ML IJ SOLN: 12.5000 mg | Freq: Four times a day (QID) | INTRAMUSCULAR | Status: DC | PRN

## 2016-01-11 MED ORDER — 0.9 % SODIUM CHLORIDE (POUR BTL) OPTIME
TOPICAL | Status: DC | PRN
  Administered 2016-01-11: 1000 mL

## 2016-01-11 MED ORDER — SUGAMMADEX SODIUM 200 MG/2ML IV SOLN
INTRAVENOUS | Status: AC
  Filled 2016-01-11: qty 2

## 2016-01-11 MED ORDER — ORAL CARE MOUTH RINSE
15.0000 mL | Freq: Two times a day (BID) | OROMUCOSAL | Status: DC
  Administered 2016-01-11 – 2016-01-16 (×6): 15 mL via OROMUCOSAL

## 2016-01-11 MED ORDER — HYDROMORPHONE HCL 1 MG/ML IJ SOLN
0.2500 mg | INTRAMUSCULAR | Status: DC | PRN
  Administered 2016-01-11 (×2): 0.5 mg via INTRAVENOUS

## 2016-01-11 MED ORDER — CEFUROXIME SODIUM 750 MG IJ SOLR
INTRAMUSCULAR | Status: AC
  Filled 2016-01-11: qty 750

## 2016-01-11 MED ORDER — SENNOSIDES-DOCUSATE SODIUM 8.6-50 MG PO TABS
1.0000 | ORAL_TABLET | Freq: Every day | ORAL | Status: DC
Start: 2016-01-11 — End: 2016-01-13
  Administered 2016-01-12: 1 via ORAL
  Filled 2016-01-11 (×2): qty 1

## 2016-01-11 MED ORDER — INSULIN ASPART 100 UNIT/ML ~~LOC~~ SOLN
0.0000 [IU] | Freq: Four times a day (QID) | SUBCUTANEOUS | Status: DC
  Administered 2016-01-11 (×3): 4 [IU] via SUBCUTANEOUS
  Administered 2016-01-12 (×2): 8 [IU] via SUBCUTANEOUS
  Administered 2016-01-12 – 2016-01-14 (×4): 4 [IU] via SUBCUTANEOUS
  Administered 2016-01-15 (×2): 2 [IU] via SUBCUTANEOUS

## 2016-01-11 MED ORDER — SUGAMMADEX SODIUM 200 MG/2ML IV SOLN
INTRAVENOUS | Status: DC | PRN
  Administered 2016-01-11: 160 mg via INTRAVENOUS

## 2016-01-11 MED ORDER — PHENYLEPHRINE HCL 10 MG/ML IJ SOLN
INTRAVENOUS | Status: DC | PRN
  Administered 2016-01-11: 30 ug/min via INTRAVENOUS

## 2016-01-11 MED ORDER — DIPHENHYDRAMINE HCL 12.5 MG/5ML PO ELIX: 12.5000 mg | ORAL_SOLUTION | Freq: Four times a day (QID) | ORAL | Status: DC | PRN

## 2016-01-11 MED ORDER — ROCURONIUM BROMIDE 50 MG/5ML IV SOSY
PREFILLED_SYRINGE | INTRAVENOUS | Status: AC
  Filled 2016-01-11: qty 5

## 2016-01-11 MED ORDER — PIPERACILLIN-TAZOBACTAM 3.375 G IVPB
3.3750 g | Freq: Three times a day (TID) | INTRAVENOUS | Status: DC
  Filled 2016-01-11 (×3): qty 50

## 2016-01-11 MED ORDER — PHENYLEPHRINE HCL 10 MG/ML IJ SOLN
INTRAMUSCULAR | Status: DC | PRN
  Administered 2016-01-11: 120 ug via INTRAVENOUS

## 2016-01-11 MED ORDER — POTASSIUM CHLORIDE 2 MEQ/ML IV SOLN
30.0000 meq | Freq: Every day | INTRAVENOUS | Status: DC | PRN
  Administered 2016-01-12: 30 meq via INTRAVENOUS
  Filled 2016-01-11 (×3): qty 15

## 2016-01-11 MED ORDER — SODIUM CHLORIDE 0.9% FLUSH: 9.0000 mL | INTRAVENOUS | Status: DC | PRN

## 2016-01-11 MED ORDER — MIDAZOLAM HCL 2 MG/2ML IJ SOLN: 0.5000 mg | Freq: Once | INTRAMUSCULAR | Status: DC | PRN

## 2016-01-11 MED ORDER — DEXTROSE-NACL 5-0.9 % IV SOLN
INTRAVENOUS | Status: DC
  Administered 2016-01-11 – 2016-01-12 (×2): via INTRAVENOUS

## 2016-01-11 MED ORDER — FENTANYL 40 MCG/ML IV SOLN
INTRAVENOUS | Status: DC
  Administered 2016-01-11: 10:00:00 via INTRAVENOUS
  Administered 2016-01-11 – 2016-01-12 (×4): 20 ug via INTRAVENOUS
  Administered 2016-01-12: 50 ug via INTRAVENOUS
  Administered 2016-01-12: 30 ug via INTRAVENOUS
  Administered 2016-01-12: 20 ug via INTRAVENOUS
  Administered 2016-01-12: 10 ug via INTRAVENOUS
  Administered 2016-01-13: 0 ug via INTRAVENOUS
  Administered 2016-01-13: 30 ug via INTRAVENOUS
  Administered 2016-01-13: 20 ug via INTRAVENOUS
  Administered 2016-01-13: 10 ug via INTRAVENOUS
  Administered 2016-01-13: 30 ug via INTRAVENOUS
  Administered 2016-01-13 – 2016-01-14 (×2): 10 ug via INTRAVENOUS
  Administered 2016-01-14: 40 ug via INTRAVENOUS
  Administered 2016-01-14 (×2): 30 ug via INTRAVENOUS
  Administered 2016-01-14 – 2016-01-15 (×4): 10 ug via INTRAVENOUS

## 2016-01-11 MED ORDER — SODIUM CHLORIDE 0.9% FLUSH
10.0000 mL | Freq: Two times a day (BID) | INTRAVENOUS | Status: DC
  Administered 2016-01-11 – 2016-01-15 (×8): 10 mL

## 2016-01-11 MED ORDER — ACETAMINOPHEN 160 MG/5ML PO SOLN: 1000.0000 mg | Freq: Four times a day (QID) | ORAL | Status: AC

## 2016-01-11 MED ORDER — ROCURONIUM BROMIDE 100 MG/10ML IV SOLN
INTRAVENOUS | Status: DC | PRN
  Administered 2016-01-11: 50 mg via INTRAVENOUS
  Administered 2016-01-11: 10 mg via INTRAVENOUS

## 2016-01-11 MED ORDER — FENTANYL CITRATE (PF) 250 MCG/5ML IJ SOLN
INTRAMUSCULAR | Status: AC
  Filled 2016-01-11: qty 5

## 2016-01-11 MED ORDER — ONDANSETRON HCL 4 MG/2ML IJ SOLN: 4.0000 mg | Freq: Four times a day (QID) | INTRAMUSCULAR | Status: DC | PRN

## 2016-01-11 MED ORDER — SODIUM CHLORIDE 0.9% FLUSH: 10.0000 mL | INTRAVENOUS | Status: DC | PRN

## 2016-01-11 MED ORDER — PROPOFOL 10 MG/ML IV BOLUS
INTRAVENOUS | Status: DC | PRN
  Administered 2016-01-11: 70 mg via INTRAVENOUS

## 2016-01-11 MED ORDER — MIDAZOLAM HCL 2 MG/2ML IJ SOLN
INTRAMUSCULAR | Status: AC
  Filled 2016-01-11: qty 2

## 2016-01-11 MED ORDER — DEXTROSE-NACL 5-0.9 % IV SOLN: INTRAVENOUS | Status: DC

## 2016-01-11 MED ORDER — ASPIRIN EC 81 MG PO TBEC
81.0000 mg | DELAYED_RELEASE_TABLET | Freq: Every day | ORAL | Status: DC
  Administered 2016-01-12 – 2016-01-15 (×4): 81 mg via ORAL
  Filled 2016-01-11 (×4): qty 1

## 2016-01-11 MED ORDER — NALOXONE HCL 0.4 MG/ML IJ SOLN: 0.4000 mg | INTRAMUSCULAR | Status: DC | PRN

## 2016-01-11 MED ORDER — OXYCODONE HCL 5 MG PO TABS: 5.0000 mg | ORAL_TABLET | ORAL | Status: DC | PRN

## 2016-01-11 MED ORDER — BISACODYL 5 MG PO TBEC
10.0000 mg | DELAYED_RELEASE_TABLET | Freq: Every day | ORAL | Status: DC
  Administered 2016-01-12 – 2016-01-13 (×2): 10 mg via ORAL
  Filled 2016-01-11 (×2): qty 2

## 2016-01-11 MED ORDER — PROMETHAZINE HCL 25 MG/ML IJ SOLN: 6.2500 mg | INTRAMUSCULAR | Status: DC | PRN

## 2016-01-11 MED ORDER — ONDANSETRON HCL 4 MG/2ML IJ SOLN
INTRAMUSCULAR | Status: AC
  Filled 2016-01-11: qty 2

## 2016-01-11 MED ORDER — LACTATED RINGERS IV SOLN
INTRAVENOUS | Status: DC | PRN
  Administered 2016-01-11 (×2): via INTRAVENOUS

## 2016-01-11 MED ORDER — HYDROMORPHONE HCL 1 MG/ML IJ SOLN
INTRAMUSCULAR | Status: AC
  Filled 2016-01-11: qty 1.5

## 2016-01-11 MED ORDER — FENTANYL CITRATE (PF) 100 MCG/2ML IJ SOLN
INTRAMUSCULAR | Status: DC | PRN
  Administered 2016-01-11: 50 ug via INTRAVENOUS
  Administered 2016-01-11: 200 ug via INTRAVENOUS

## 2016-01-11 MED ORDER — MEPERIDINE HCL 25 MG/ML IJ SOLN: 6.2500 mg | INTRAMUSCULAR | Status: DC | PRN

## 2016-01-11 MED ORDER — METOPROLOL SUCCINATE ER 25 MG PO TB24
25.0000 mg | ORAL_TABLET | Freq: Every day | ORAL | Status: DC
  Administered 2016-01-12 – 2016-01-16 (×5): 25 mg via ORAL
  Filled 2016-01-11 (×5): qty 1

## 2016-01-11 MED ORDER — HEMOSTATIC AGENTS (NO CHARGE) OPTIME
TOPICAL | Status: DC | PRN
  Administered 2016-01-11: 1 via TOPICAL

## 2016-01-11 MED ORDER — PROPOFOL 10 MG/ML IV BOLUS
INTRAVENOUS | Status: AC
  Filled 2016-01-11: qty 20

## 2016-01-11 MED ORDER — ACETAMINOPHEN 500 MG PO TABS
1000.0000 mg | ORAL_TABLET | Freq: Four times a day (QID) | ORAL | Status: AC
  Administered 2016-01-11 – 2016-01-16 (×16): 1000 mg via ORAL
  Filled 2016-01-11 (×14): qty 2

## 2016-01-11 MED ORDER — AMLODIPINE BESYLATE 5 MG PO TABS
5.0000 mg | ORAL_TABLET | Freq: Every day | ORAL | Status: DC
  Administered 2016-01-12 – 2016-01-15 (×4): 5 mg via ORAL
  Filled 2016-01-11 (×4): qty 1

## 2016-01-11 MED ORDER — VANCOMYCIN HCL IN DEXTROSE 1-5 GM/200ML-% IV SOLN
1000.0000 mg | Freq: Every day | INTRAVENOUS | Status: DC
  Administered 2016-01-12 – 2016-01-14 (×4): 1000 mg via INTRAVENOUS
  Filled 2016-01-11 (×4): qty 200

## 2016-01-11 MED ORDER — TRAMADOL HCL 50 MG PO TABS
50.0000 mg | ORAL_TABLET | Freq: Two times a day (BID) | ORAL | Status: DC | PRN
Start: 2016-01-11 — End: 2016-01-16

## 2016-01-11 MED ORDER — BUPIVACAINE HCL (PF) 0.5 % IJ SOLN
INTRAMUSCULAR | Status: AC
  Filled 2016-01-11: qty 30

## 2016-01-11 MED ORDER — BUPIVACAINE 0.5 % ON-Q PUMP SINGLE CATH 400 ML
400.0000 mL | INJECTION | Status: AC
  Administered 2016-01-11: 400 mL
  Filled 2016-01-11: qty 400

## 2016-01-11 MED ORDER — PANTOPRAZOLE SODIUM 40 MG PO TBEC
40.0000 mg | DELAYED_RELEASE_TABLET | Freq: Every day | ORAL | Status: DC
  Administered 2016-01-12 – 2016-01-16 (×5): 40 mg via ORAL
  Filled 2016-01-11 (×5): qty 1

## 2016-01-11 MED ORDER — ONDANSETRON HCL 4 MG/2ML IJ SOLN
INTRAMUSCULAR | Status: DC | PRN
Start: 2016-01-11 — End: 2016-01-11
  Administered 2016-01-11: 4 mg via INTRAVENOUS

## 2016-01-11 SURGICAL SUPPLY — 67 items
BAG DECANTER FOR FLEXI CONT (MISCELLANEOUS) IMPLANT
BLADE SURG 11 STRL SS (BLADE) ×4 IMPLANT
CANISTER SUCTION 2500CC (MISCELLANEOUS) ×4 IMPLANT
CATH KIT ON Q 5IN SLV (PAIN MANAGEMENT) ×4 IMPLANT
CATH ROBINSON RED A/P 22FR (CATHETERS) IMPLANT
CATH THORACIC 28FR (CATHETERS) IMPLANT
CATH THORACIC 36FR (CATHETERS) IMPLANT
CATH THORACIC 36FR RT ANG (CATHETERS) IMPLANT
CONN ST 1/4X3/8  BEN (MISCELLANEOUS) ×2
CONN ST 1/4X3/8 BEN (MISCELLANEOUS) ×2 IMPLANT
CONT SPEC 4OZ CLIKSEAL STRL BL (MISCELLANEOUS) ×8 IMPLANT
DERMABOND ADVANCED (GAUZE/BANDAGES/DRESSINGS)
DERMABOND ADVANCED .7 DNX12 (GAUZE/BANDAGES/DRESSINGS) IMPLANT
DRAPE LAPAROSCOPIC ABDOMINAL (DRAPES) ×4 IMPLANT
DRAPE WARM FLUID 44X44 (DRAPE) ×4 IMPLANT
ELECT BLADE 4.0 EZ CLEAN MEGAD (MISCELLANEOUS) ×4
ELECT REM PT RETURN 9FT ADLT (ELECTROSURGICAL) ×4
ELECTRODE BLDE 4.0 EZ CLN MEGD (MISCELLANEOUS) ×2 IMPLANT
ELECTRODE REM PT RTRN 9FT ADLT (ELECTROSURGICAL) ×2 IMPLANT
GAUZE SPONGE 4X4 12PLY STRL (GAUZE/BANDAGES/DRESSINGS) ×4 IMPLANT
GLOVE BIO SURGEON STRL SZ7.5 (GLOVE) ×8 IMPLANT
GOWN STRL REUS W/ TWL LRG LVL3 (GOWN DISPOSABLE) ×6 IMPLANT
GOWN STRL REUS W/TWL LRG LVL3 (GOWN DISPOSABLE) ×6
KIT BASIN OR (CUSTOM PROCEDURE TRAY) ×4 IMPLANT
KIT ROOM TURNOVER OR (KITS) ×4 IMPLANT
KIT SUCTION CATH 14FR (SUCTIONS) ×4 IMPLANT
NS IRRIG 1000ML POUR BTL (IV SOLUTION) ×16 IMPLANT
PACK CHEST (CUSTOM PROCEDURE TRAY) ×4 IMPLANT
PAD ARMBOARD 7.5X6 YLW CONV (MISCELLANEOUS) ×8 IMPLANT
PASSER SUT SWANSON 36MM LOOP (INSTRUMENTS) ×4 IMPLANT
SEALANT SURG COSEAL 4ML (VASCULAR PRODUCTS) IMPLANT
SOLUTION ANTI FOG 6CC (MISCELLANEOUS) ×4 IMPLANT
SPONGE GAUZE 4X4 12PLY STER LF (GAUZE/BANDAGES/DRESSINGS) ×4 IMPLANT
SPONGE INTESTINAL PEANUT (DISPOSABLE) ×4 IMPLANT
SPONGE TONSIL 1.25 RF SGL STRG (GAUZE/BANDAGES/DRESSINGS) ×12 IMPLANT
SUT CHROMIC 3 0 SH 27 (SUTURE) IMPLANT
SUT ETHILON 3 0 PS 1 (SUTURE) IMPLANT
SUT PROLENE 3 0 SH DA (SUTURE) IMPLANT
SUT PROLENE 4 0 RB 1 (SUTURE)
SUT PROLENE 4-0 RB1 .5 CRCL 36 (SUTURE) IMPLANT
SUT PROLENE 6 0 C 1 30 (SUTURE) IMPLANT
SUT SILK  1 MH (SUTURE) ×4
SUT SILK 1 MH (SUTURE) ×4 IMPLANT
SUT SILK 1 TIES 10X30 (SUTURE) IMPLANT
SUT SILK 2 0 SH (SUTURE) ×4 IMPLANT
SUT SILK 2 0SH CR/8 30 (SUTURE) IMPLANT
SUT SILK 3 0SH CR/8 30 (SUTURE) IMPLANT
SUT VIC AB 1 CTX 18 (SUTURE) ×16 IMPLANT
SUT VIC AB 2 TP1 27 (SUTURE) IMPLANT
SUT VIC AB 2-0 CT2 18 VCP726D (SUTURE) IMPLANT
SUT VIC AB 2-0 CTX 36 (SUTURE) ×4 IMPLANT
SUT VIC AB 3-0 SH 18 (SUTURE) IMPLANT
SUT VIC AB 3-0 X1 27 (SUTURE) ×4 IMPLANT
SUT VICRYL 0 UR6 27IN ABS (SUTURE) IMPLANT
SUT VICRYL 2 TP 1 (SUTURE) ×4 IMPLANT
SWAB COLLECTION DEVICE MRSA (MISCELLANEOUS) ×4 IMPLANT
SWAB CULTURE ESWAB REG 1ML (MISCELLANEOUS) ×4 IMPLANT
SYSTEM SAHARA CHEST DRAIN ATS (WOUND CARE) ×4 IMPLANT
TAPE CLOTH SURG 4X10 WHT LF (GAUZE/BANDAGES/DRESSINGS) ×4 IMPLANT
TIP APPLICATOR SPRAY EXTEND 16 (VASCULAR PRODUCTS) IMPLANT
TOWEL OR 17X24 6PK STRL BLUE (TOWEL DISPOSABLE) ×4 IMPLANT
TOWEL OR 17X26 10 PK STRL BLUE (TOWEL DISPOSABLE) ×8 IMPLANT
TRAP SPECIMEN MUCOUS 40CC (MISCELLANEOUS) ×4 IMPLANT
TRAY FOLEY CATH 16FRSI W/METER (SET/KITS/TRAYS/PACK) ×4 IMPLANT
TUBE ANAEROBIC SPECIMEN COL (MISCELLANEOUS) IMPLANT
TUNNELER SHEATH ON-Q 11GX8 DSP (PAIN MANAGEMENT) ×4 IMPLANT
WATER STERILE IRR 1000ML POUR (IV SOLUTION) ×8 IMPLANT

## 2016-01-11 NOTE — Progress Notes (Signed)
PROGRESS NOTE    Dillon [name]  [id number] DOB: January 19, 1934 DOA: 01/07/2016 PCP: Daphene Calamity, MD     Brief Narrative:  Dillon [name] is a 82 y.o. male with medical history significant of CAD s/p CABG, HTN, HLD, hypothyroidism who started having productive cough of yellow sputum about 2 weeks ago.  Chest x-ray results came back and showed pneumonia as well as possible empyema. CT chest was obtained. CTS was consulted by EDP recommended thoracentesis.  Assessment & Plan:   Sepsis secondary to empyema/loculated left pleural effusion -sepsis physiology resolved -Continue rocephin/azithromax Day 5/10 -Blood cultures NGTD -s/p Thoracentesis 12/12: 50cc turbid fluid drained, extensive loculated collection,  -FU pleural fluid cultures-No growth x2days -appreciate CVTS consult per Dr.Van Trigt, plan for VATs with decortication this morning  Constipation -+Bm, continue miralax, stool softeners   Acute kidney injury  -Baseline Cr 1.1 -In setting of sepsis Resolved, off IVF  CAD s/p CABG -Aspirin, will hold plavix due to thoracentesis and need for procedures  HTN -Continue norvasc, metoprolol  -Held cozaar due to AKI   Hypothyroidism -Continue synthroid  HLD -Continue crestor, niacin   Possible pancreatic tail mass versus normal variant on CT -Recommend follow-up abdominal CT with contrast in near future (at time of follow up CT chest possibly)   DVT prophylaxis: subq hep Code Status: Full Family Communication: wife at bedside Disposition Plan: likley ICU post VATS   Consultants:   CVTS  Procedures:   None  Antimicrobials:   Vanco/cefepime 12/11 >>    Subjective: Feels ok, awaiting surgery   Objective: Vitals:   01/10/16 2110 01/11/16 0544 01/11/16 1019 01/11/16 1035  BP: 136/68 125/71  129/71  Pulse: 85 85 (!) 48   Resp: 18 18 16 18   Temp: 98.6 F (37 C) 98.3 F (36.8 C) 97.3 F (36.3 C)   TempSrc: Oral Oral    SpO2: 92% 95% (!) 89%     Weight: 78.4 kg (172 lb 13.5 oz)     Height:        Intake/Output Summary (Last 24 hours) at 01/11/16 1051 Last data filed at 01/11/16 0927  Gross per 24 hour  Intake             1900 ml  Output             1426 ml  Net              474 ml   Filed Weights   01/08/16 2139 01/09/16 2040 01/10/16 2110  Weight: 77.3 kg (170 lb 6.4 oz) 78.3 kg (172 lb 9.6 oz) 78.4 kg (172 lb 13.5 oz)    Examination:  General exam: Appears calm and comfortable, AAOx3, no distress Respiratory system:  Diminished breath sounds on left, rhonchi diffusely. On nasal cannula O2, no conversational dyspnea and no respiratory distress. Cardiovascular system: S1 & S2 heard, RRR. No JVD, murmurs, rubs, gallops or clicks. No pedal edema. Gastrointestinal system: Abdomen is distended, soft and nontender.  Normal bowel sounds heard. Central nervous system: Alert and oriented. No focal neurological deficits. Extremities: Symmetric 5 x 5 power. Skin: No rashes, lesions or ulcers Psychiatry: Judgement and insight appear normal. Mood & affect appropriate.   Data Reviewed: I have personally reviewed following labs and imaging studies  CBC:  Recent Labs Lab 01/07/16 1244 01/08/16 0500 01/09/16 0940 01/10/16 0354 01/10/16 0916  WBC 25.4* 23.7* 22.9* 18.1* 23.8*  NEUTROABS 22.8*  --  20.4* 16.0*  --   HGB 10.1* 9.4* 9.5* 9.2* 10.3*  HCT 30.1* 28.8* 28.8* 28.3* 31.2*  MCV 93.2 95.4 94.4 95.0 95.1  PLT 516* 457* 500* 455* 0000000*   Basic Metabolic Panel:  Recent Labs Lab 01/07/16 1244 01/08/16 0500 01/09/16 0940 01/10/16 0354 01/10/16 0916  NA 132* 133* 134* 133* 134*  K 4.5 4.6 4.0 3.9 4.3  CL 99* 101 105 102 103  CO2 23 24 20* 22 23  GLUCOSE 175* 141* 244* 156* 266*  BUN 19 19 16 15 13   CREATININE 1.77* 1.85* 1.59* 1.50* 1.52*  CALCIUM 9.8 9.1 8.9 8.6* 9.2   GFR: Estimated Creatinine Clearance: 37.8 mL/min (by C-G formula based on SCr of 1.52 mg/dL (H)). Liver Function Tests:  Recent Labs Lab  01/07/16 1244 01/08/16 0500 01/10/16 0916  AST 46* 61* 107*  ALT 71* 67* 101*  ALKPHOS 148* 145* 188*  BILITOT 0.3 0.2* 0.3  PROT 6.8 6.0* 6.7  ALBUMIN 2.3* 1.9* 2.0*   No results for input(s): LIPASE, AMYLASE in the last 168 hours. No results for input(s): AMMONIA in the last 168 hours. Coagulation Profile:  Recent Labs Lab 01/10/16 0559  INR 1.23   Cardiac Enzymes: No results for input(s): CKTOTAL, CKMB, CKMBINDEX, TROPONINI in the last 168 hours. BNP (last 3 results) No results for input(s): PROBNP in the last 8760 hours. HbA1C:  Recent Labs  01/10/16 0559  HGBA1C 7.2*   CBG: No results for input(s): GLUCAP in the last 168 hours. Lipid Profile: No results for input(s): CHOL, HDL, LDLCALC, TRIG, CHOLHDL, LDLDIRECT in the last 72 hours. Thyroid Function Tests:  Recent Labs  01/10/16 0559  TSH 4.322   Anemia Panel: No results for input(s): VITAMINB12, FOLATE, FERRITIN, TIBC, IRON, RETICCTPCT in the last 72 hours. Sepsis Labs: No results for input(s): PROCALCITON, LATICACIDVEN in the last 168 hours.  Recent Results (from the past 240 hour(s))  Blood culture (routine x 2)     Status: None (Preliminary result)   Collection Time: 01/07/16  2:20 PM  Result Value Ref Range Status   Specimen Description BLOOD LEFT FOREARM  Final   Special Requests BOTTLES DRAWN AEROBIC AND ANAEROBIC 5CC  Final   Culture NO GROWTH 3 DAYS  Final   Report Status PENDING  Incomplete  Blood culture (routine x 2)     Status: None (Preliminary result)   Collection Time: 01/07/16  3:05 PM  Result Value Ref Range Status   Specimen Description BLOOD RIGHT FOREARM  Final   Special Requests BOTTLES DRAWN AEROBIC AND ANAEROBIC 5CC  Final   Culture NO GROWTH 3 DAYS  Final   Report Status PENDING  Incomplete  Culture, body fluid-bottle     Status: None (Preliminary result)   Collection Time: 01/08/16 11:29 AM  Result Value Ref Range Status   Specimen Description PLEURAL LEFT  Final    Special Requests NONE  Final   Culture NO GROWTH 2 DAYS  Final   Report Status PENDING  Incomplete  Gram stain     Status: None   Collection Time: 01/08/16 11:29 AM  Result Value Ref Range Status   Specimen Description PLEURAL LEFT  Final   Special Requests NONE  Final   Gram Stain   Final    FEW WBC PRESENT,BOTH PMN AND MONONUCLEAR NO ORGANISMS SEEN    Report Status 01/09/2016 FINAL  Final  Surgical pcr screen     Status: None   Collection Time: 01/10/16  6:02 AM  Result Value Ref Range Status   MRSA, PCR NEGATIVE NEGATIVE Final   Staphylococcus  aureus NEGATIVE NEGATIVE Final    Comment:        The Xpert SA Assay (FDA approved for NASAL specimens in patients over 80 years of age), is one component of a comprehensive surveillance program.  Test performance has been validated by [facility] for patients greater than or equal to 71 year old. It is not intended to diagnose infection nor to guide or monitor treatment.   Aerobic Culture (superficial specimen)     Status: None (Preliminary result)   Collection Time: 01/11/16  8:55 AM  Result Value Ref Range Status   Specimen Description TISSUE LEFT PLEURAL  Final   Special Requests   Final    LEFT PLEURAL PEEL PATIENT ON FOLLOWING  ZINACEF AZITHROMYCIN AND CEFTIN   Gram Stain PENDING  Incomplete   Culture PENDING  Incomplete   Report Status PENDING  Incomplete       Radiology Studies: Dg Chest Port 1 View  Result Date: 01/11/2016 CLINICAL DATA:  Status post left VATS. Left chest to and central line placement. EXAM: PORTABLE CHEST 1 VIEW COMPARISON:  Chest CT 01/07/2016 and single-view of the chest 01/08/2016. FINDINGS: New left IJ catheter is in place with the tip projecting in the mid superior vena cava. The patient also has 2 new left chest tubes. Very small left apical pneumothorax is noted. Left pleural effusion and basilar airspace disease are decreased. Minimal atelectasis in the right mid lung is noted. Heart size is  normal. IMPRESSION: Left IJ catheter tip projects in the mid superior vena cava. Two left chest tubes in place. Very small left apical pneumothorax is seen. Decreased left pleural effusion and airspace disease after VATS. Electronically Signed   By: Inge Rise M.D.   On: 01/11/2016 10:37      Scheduled Meds: . [MAR Hold] amLODipine  5 mg Oral QHS  . [MAR Hold] aspirin EC  81 mg Oral QHS  . [MAR Hold] azithromycin  500 mg Intravenous Q24H  . [MAR Hold] cefTRIAXone (ROCEPHIN)  IV  1 g Intravenous Q24H  . feeding supplement (ENSURE ENLIVE)  237 mL Oral TID WC  . fentaNYL   Intravenous Q4H  . fentaNYL      . [MAR Hold] heparin  5,000 Units Subcutaneous Q8H  . HYDROmorphone      . [MAR Hold] levothyroxine  112 mcg Oral QAC breakfast  . [MAR Hold] metoprolol succinate  50 mg Oral Daily  . [MAR Hold] niacin  1,000 mg Oral QHS  . [MAR Hold] polyethylene glycol  17 g Oral Daily  . [MAR Hold] rosuvastatin  40 mg Oral QHS  . [MAR Hold] sodium chloride flush  3 mL Intravenous Q12H   Continuous Infusions:    LOS: 4 days    Time spent: 30 minutes   Domenic Polite, MD [facility] [email or web address] Password [email or web address] 01/11/2016, 10:51 AM

## 2016-01-11 NOTE — Anesthesia Procedure Notes (Addendum)
Central Venous Catheter Insertion Performed by: anesthesiologist 01/11/2016 7:15 AM Patient location: Pre-op. Preanesthetic checklist: patient identified, IV checked, site marked, risks and benefits discussed, surgical consent, monitors and equipment checked, pre-op evaluation, timeout performed and anesthesia consent Position: supine Lidocaine 1% used for infiltration Landmarks identified and Seldinger technique used Catheter size: 8 Fr Central line was placed.Double lumen Procedure performed using ultrasound guided technique. Attempts: 1 Following insertion, line sutured, dressing applied and Biopatch. Post procedure assessment: blood return through all ports, free fluid flow and no air. Patient tolerated the procedure well with no immediate complications. Additional procedure comments: CVP: Timeout, sterile prep, drape, FBP L neck.  Trendelenburg position.  1% lido local, finder and trocar LIJ 1st pass with US guidance.  2 lumen placed over J wire. Biopatch and sterile dressing on.  Patient tolerated well.  VSS.  Jenita Seashore, MD.

## 2016-01-11 NOTE — Transfer of Care (Signed)
Immediate Anesthesia Transfer of Care Note  Patient: Dillon [name]  Procedure(s) Performed: Procedure(s): VIDEO ASSISTED THORACOSCOPY (VATS)/DECORTICATION (Left) EMPYEMA DRAINAGE (Left)  Patient Location: PACU  Anesthesia Type:General  Level of Consciousness: awake, alert  and patient cooperative  Airway & Oxygen Therapy: Patient Spontanous Breathing and Patient connected to face mask oxygen  Post-op Assessment: Report given to RN and Post -op Vital signs reviewed and stable  Post vital signs: Reviewed and stable  Last Vitals:  Vitals:   01/10/16 2110 01/11/16 0544  BP: 136/68 125/71  Pulse: 85 85  Resp: 18 18  Temp: 37 C 36.8 C    Last Pain:  Vitals:   01/11/16 0544  TempSrc: Oral  PainSc:          Complications: No apparent anesthesia complications

## 2016-01-11 NOTE — Brief Op Note (Signed)
01/07/2016 - 01/11/2016  9:57 AM  PATIENT:  Dillon [name]  82 y.o. male  PRE-OPERATIVE DIAGNOSIS:  LEFT EMPYEMA  POST-OPERATIVE DIAGNOSIS:  LEFT EMPYEMA  PROCEDURE:  LEFT VIDEO ASSISTED THORACOSCOPY, LEFT MINI THORACOTOMY, DRAINAGE of LEFT EMPYEMA, DECORTICATION, and PLACEMENT of ON Q   SURGEON:  Surgeon(s) and Role:    * Ivin Poot, MD - Primary  PHYSICIAN ASSISTANT: Lars Pinks PA-C  ANESTHESIA:   general  EBL:  Total I/O In: 1000 [I.V.:1000] Out: 350 [Urine:200; Blood:150]  BLOOD ADMINISTERED:none  DRAINS: none   LOCAL MEDICATIONS USED:  MARCAINE     SPECIMEN:  Source of Specimen:  Left pleural fluid, pleural peel  DISPOSITION OF SPECIMEN:  Pathology and cultures sent  COUNTS CORRECT:  YES  DICTATION: .Dragon Dictation  PLAN OF CARE: Admit to inpatient   PATIENT DISPOSITION:  PACU - hemodynamically stable.   Delay start of Pharmacological VTE agent (>24hrs) due to surgical blood loss or risk of bleeding: yes

## 2016-01-11 NOTE — Progress Notes (Signed)
Inpatient Diabetes Program Recommendations  AACE/ADA: New Consensus Statement on Inpatient Glycemic Control (2015)  Target Ranges:  Prepandial:   less than 140 mg/dL      Peak postprandial:   less than 180 mg/dL (1-2 hours)      Critically ill patients:  140 - 180 mg/dL   Lab Results  Component Value Date   GLUCAP 197 (H) 01/11/2016   HGBA1C 7.2 (H) 01/10/2016    Review of Glycemic Control:  Results for Dillon [name], Dillon [name] (MRN [id number]) as of 01/11/2016 14:33  Ref. Range 01/11/2016 14:24  Glucose-Capillary Latest Ref Range: 65 - 99 mg/dL 197 (H)   Diabetes history: None noted however A1C=7.2%.  Is this new diagnosis of diabetes?   Current orders for Inpatient glycemic control:  TCTS Novolog correction q 6 hours  Inpatient Diabetes Program Recommendations:   Agree with current orders.  May need home medications for diabetes if this is new diagnosis?  Thanks, Adah Perl, RN, BC-ADM Inpatient Diabetes Coordinator Pager [phone] (8a-5p)

## 2016-01-11 NOTE — Progress Notes (Signed)
Pharmacy Antibiotic Note  Dillon [name] is a 82 y.o. male s/p thoracentesis 12/12, s/p VATS and drainage of empyema 12/15. WBC up to 23. Antibiotics broadened to zosyn earlier today and now adding Vancomycin.   Plan: 1) Continue Zosyn 3.375g IV q8 (4 hour infusion) 2) Vancomycin 1gm IV q24h 3) Follow renal function, cultures, LOT, and Vanc trough prn  Height: 5' 5.5" (166.4 cm) Weight: 172 lb 13.5 oz (78.4 kg) IBW/kg (Calculated) : 62.65  Temp (24hrs), Avg:97.8 F (36.6 C), Min:97.1 F (36.2 C), Max:98.6 F (37 C)   Recent Labs Lab 01/07/16 1244 01/08/16 0500 01/09/16 0940 01/10/16 0354 01/10/16 0916  WBC 25.4* 23.7* 22.9* 18.1* 23.8*  CREATININE 1.77* 1.85* 1.59* 1.50* 1.52*    Estimated Creatinine Clearance: 37.8 mL/min (by C-G formula based on SCr of 1.52 mg/dL (H)).    Allergies  Allergen Reactions  . No Known Allergies     Antimicrobials this admission: CTX 12/11 >> 12/15 Azith 12/11 >> 12/15 Zosyn 12/15 >> Vanc 12/15 >>  Dose adjustments this admission: n/a  Microbiology results: 12/11 BCx: ngtd 12/12 left pleural fluid: ngtd 12/14 MRSA PCR negative 12/15 left pleural fluid: 12/15 left pleural tissue:  Thank you for allowing pharmacy to be a part of this patient's care.  Sherlon Handing, PharmD, BCPS Clinical pharmacist, pager [phone] 01/11/2016 11:47 PM

## 2016-01-11 NOTE — Anesthesia Postprocedure Evaluation (Signed)
Anesthesia Post Note  Patient: Dillon [name]  Procedure(s) Performed: Procedure(s) (LRB): VIDEO ASSISTED THORACOSCOPY (VATS)/DECORTICATION (Left) EMPYEMA DRAINAGE (Left)  Patient location during evaluation: PACU Anesthesia Type: General Level of consciousness: awake and alert, oriented and patient cooperative Pain management: pain level controlled Vital Signs Assessment: post-procedure vital signs reviewed and stable Respiratory status: spontaneous breathing, nonlabored ventilation, respiratory function stable and patient connected to nasal cannula oxygen Cardiovascular status: blood pressure returned to baseline and stable Postop Assessment: no signs of nausea or vomiting Anesthetic complications: no    Last Vitals:  Vitals:   01/11/16 1600 01/11/16 1735  BP:    Pulse:    Resp: 18   Temp:  37 C    Last Pain:  Vitals:   01/11/16 1735  TempSrc: Oral  PainSc:                  Dawn Convery,E. Samhitha Rosen

## 2016-01-11 NOTE — Progress Notes (Signed)
Patient ID: Dillon [name], male   DOB: 1933-03-17, 82 y.o.   MRN: [id number] EVENING ROUNDS NOTE :     [address]       [place] [address]             [phone]                 Day of Surgery Procedure(s) (LRB): VIDEO ASSISTED THORACOSCOPY (VATS)/DECORTICATION (Left) EMPYEMA DRAINAGE (Left)  Total Length of Stay:  LOS: 4 days  BP (!) 125/56   Pulse (!) 37   Temp 98.6 F (37 C) (Oral)   Resp 18   Ht 5' 5.5" (1.664 m)   Wt 172 lb 13.5 oz (78.4 kg)   SpO2 94%   BMI 28.32 kg/m   .Intake/Output      12/14 0701 - 12/15 0700 12/15 0701 - 12/16 0700   P.O. 600    I.V. (mL/kg)  1600 (20.4)   IV Piggyback 300 50   Total Intake(mL/kg) 900 (11.5) 1650 (21)   Urine (mL/kg/hr) 1075 (0.6) 365 (0.4)   Stool 1 (0)    Blood  150 (0.2)   Chest Tube  250 (0.3)   Total Output 1076 765   Net -176 +885        Urine Occurrence 0 x    Stool Occurrence 0 x      . dextrose 5 % and 0.9% NaCl 100 mL/hr at 01/11/16 1418     Lab Results  Component Value Date   WBC 23.8 (H) 01/10/2016   HGB 10.3 (L) 01/10/2016   HCT 31.2 (L) 01/10/2016   PLT 531 (H) 01/10/2016   GLUCOSE 266 (H) 01/10/2016   CHOL 145 01/18/2014   TRIG 70 01/18/2014   HDL 64 01/18/2014   LDLCALC 67 01/18/2014   ALT 101 (H) 01/10/2016   AST 107 (H) 01/10/2016   NA 134 (L) 01/10/2016   K 4.3 01/10/2016   CL 103 01/10/2016   CREATININE 1.52 (H) 01/10/2016   BUN 13 01/10/2016   CO2 23 01/10/2016   TSH 4.322 01/10/2016   INR 1.23 01/10/2016   HGBA1C 7.2 (H) 01/10/2016   Stable in icu Good pain control , says breathing much better  Grace Isaac MD  Beeper [phone] Office [phone] 01/11/2016 6:11 PM

## 2016-01-11 NOTE — Progress Notes (Signed)
Pharmacy Antibiotic Note  Dillon [name] is a 82 y.o. male currently on azithromycin and ceftriaxone for sepsis 2/2 LLL pneumonia with loculated empyema. S/P thoracentesis 12/12 and now s/p VATS and drainage of empyema. WBC up to 23. Antibiotics will be broadened to zosyn.   Plan: 1) Zosyn 3.375g IV q8 (4 hour infusion) 2) Follow renal function, cultures, LOT  Height: 5' 5.5" (166.4 cm) Weight: 172 lb 13.5 oz (78.4 kg) IBW/kg (Calculated) : 62.65  Temp (24hrs), Avg:98.1 F (36.7 C), Min:97.3 F (36.3 C), Max:98.6 F (37 C)   Recent Labs Lab 01/07/16 1244 01/08/16 0500 01/09/16 0940 01/10/16 0354 01/10/16 0916  WBC 25.4* 23.7* 22.9* 18.1* 23.8*  CREATININE 1.77* 1.85* 1.59* 1.50* 1.52*    Estimated Creatinine Clearance: 37.8 mL/min (by C-G formula based on SCr of 1.52 mg/dL (H)).    Allergies  Allergen Reactions  . No Known Allergies     Antimicrobials this admission: CTX 12/11 >> 12/15 Azith 12/11 >> 12/15 Zosyn 12/15 >>  Dose adjustments this admission: n/a  Microbiology results: 12/11 BCx: ngtd 12/12 left pleural fluid: ngtd 12/14 MRSA PCR negative 12/15 left pleural fluid: 12/15 left pleural tissue:  Thank you for allowing pharmacy to be a part of this patient's care.  Deboraha Sprang 01/11/2016 10:56 AM

## 2016-01-11 NOTE — Progress Notes (Signed)
The patient was examined and preop studies reviewed. There has been no change from the prior exam and the patient is ready for surgery.  plan Left VATS and drainage of empyema on J Wedemeyer

## 2016-01-11 NOTE — Anesthesia Procedure Notes (Signed)
Procedure Name: Intubation Date/Time: 01/11/2016 7:43 AM Performed by: Lance Coon Pre-anesthesia Checklist: Patient identified, Emergency Drugs available, Suction available, Timeout performed and Patient being monitored Patient Re-evaluated:Patient Re-evaluated prior to inductionOxygen Delivery Method: Circle system utilized Preoxygenation: Pre-oxygenation with 100% oxygen Intubation Type: IV induction Ventilation: Mask ventilation without difficulty Laryngoscope Size: Miller and 3 Grade View: Grade II Endobronchial tube: Left, Double lumen EBT, EBT position confirmed by auscultation and EBT position confirmed by fiberoptic bronchoscope and 37 Fr Number of attempts: 1 Airway Equipment and Method: Stylet Placement Confirmation: ETT inserted through vocal cords under direct vision,  positive ETCO2 and breath sounds checked- equal and bilateral Secured at: 27 cm Tube secured with: Tape Dental Injury: Teeth and Oropharynx as per pre-operative assessment

## 2016-01-12 ENCOUNTER — Inpatient Hospital Stay (HOSPITAL_COMMUNITY): Payer: Medicare Other

## 2016-01-12 ENCOUNTER — Encounter (HOSPITAL_COMMUNITY): Payer: Self-pay | Admitting: Cardiothoracic Surgery

## 2016-01-12 LAB — CULTURE, BLOOD (ROUTINE X 2)
CULTURE: NO GROWTH
Culture: NO GROWTH

## 2016-01-12 LAB — POCT I-STAT 3, ART BLOOD GAS (G3+)
Acid-base deficit: 2 mmol/L (ref 0.0–2.0)
Bicarbonate: 22.9 mmol/L (ref 20.0–28.0)
O2 SAT: 91 %
PCO2 ART: 39.2 mmHg (ref 32.0–48.0)
Patient temperature: 97.9
TCO2: 24 mmol/L (ref 0–100)
pH, Arterial: 7.374 (ref 7.350–7.450)
pO2, Arterial: 62 mmHg — ABNORMAL LOW (ref 83.0–108.0)

## 2016-01-12 LAB — CBC
HCT: 26.4 % — ABNORMAL LOW (ref 39.0–52.0)
Hemoglobin: 8.7 g/dL — ABNORMAL LOW (ref 13.0–17.0)
MCH: 31.1 pg (ref 26.0–34.0)
MCHC: 33 g/dL (ref 30.0–36.0)
MCV: 94.3 fL (ref 78.0–100.0)
PLATELETS: 455 10*3/uL — AB (ref 150–400)
RBC: 2.8 MIL/uL — ABNORMAL LOW (ref 4.22–5.81)
RDW: 14.5 % (ref 11.5–15.5)
WBC: 17.2 10*3/uL — AB (ref 4.0–10.5)

## 2016-01-12 LAB — BASIC METABOLIC PANEL
ANION GAP: 7 (ref 5–15)
BUN: 12 mg/dL (ref 6–20)
CALCIUM: 8.8 mg/dL — AB (ref 8.9–10.3)
CO2: 23 mmol/L (ref 22–32)
Chloride: 102 mmol/L (ref 101–111)
Creatinine, Ser: 1.38 mg/dL — ABNORMAL HIGH (ref 0.61–1.24)
GFR calc non Af Amer: 47 mL/min — ABNORMAL LOW (ref >60)
GFR, EST AFRICAN AMERICAN: 54 mL/min — AB (ref >60)
GLUCOSE: 182 mg/dL — AB (ref 65–99)
Potassium: 3.6 mmol/L (ref 3.5–5.1)
SODIUM: 132 mmol/L — AB (ref 135–145)

## 2016-01-12 LAB — GLUCOSE, CAPILLARY
GLUCOSE-CAPILLARY: 104 mg/dL — AB (ref 65–99)
GLUCOSE-CAPILLARY: 167 mg/dL — AB (ref 65–99)
GLUCOSE-CAPILLARY: 208 mg/dL — AB (ref 65–99)
Glucose-Capillary: 246 mg/dL — ABNORMAL HIGH (ref 65–99)

## 2016-01-12 LAB — GRAM STAIN

## 2016-01-12 MED ORDER — FUROSEMIDE 10 MG/ML IJ SOLN
40.0000 mg | Freq: Once | INTRAMUSCULAR | Status: AC
  Administered 2016-01-12: 40 mg via INTRAVENOUS
  Filled 2016-01-12: qty 4

## 2016-01-12 NOTE — Op Note (Deleted)
  The note originally documented on this encounter has been moved the the encounter in which it belongs.  

## 2016-01-12 NOTE — Op Note (Signed)
NAME:  Dillon [name], Dillon [name] NO.:  [id number]  MEDICAL RECORD NO.:  [id number]  LOCATION:                                 FACILITY:  PHYSICIAN:  Ivin Poot, M.D.  DATE OF BIRTH:  03/04/1933  DATE OF PROCEDURE:  01/11/2016 DATE OF DISCHARGE:                              OPERATIVE REPORT   OPERATION: 1. Left VATS (video-assisted thoracoscopic surgery) with mini     thoracotomy, drainage of left empyema, decortication of left lower     lobe. 2. Placement of On-Q wound analgesia irrigation system.  ASSISTANT:  Scheryl Darter, PA-C.  ANESTHESIA:  General.  PREOPERATIVE DIAGNOSIS:  Left lower lobe pneumonia with left empyema.  POSTOPERATIVE DIAGNOSIS:  Left lower lobe pneumonia with left empyema.  CLINICAL NOTE:  The patient is an 82 year old Caucasian male, remote smoker with 2 weeks of respiratory symptoms including productive cough, malaise, weakness, shortness of breath, and possibly low-grade fever. He presented to the emergency department with increasing shortness of breath and was found to have a left lower lobe pneumonia with pleural effusion.  He was started on antibiotics and admitted.  A thoracentesis was performed, which returned only 60 mL of clear fluid.  CT scan showed a probable empyema.  Thoracic surgical evaluation was requested.  I examined the patient and reviewed his CT scan and discussed the findings with the patient and family.  I recommended left VATS for drainage of empyema and discussed the procedure in detail including the expected benefits, alternatives, and risks.  The patient and family understood the procedure and agreed to proceed with surgery and what I wonder what I felt was an informed consent.  OPERATIVE FINDINGS: 1. Advanced empyema with peel in the left lower lobe. 2. Loculated areas of yellow pus. 3. Tissue and fluid both sent for Cultures as well as Cytology-     Pathology.  OPERATIVE PROCEDURE:  The patient was  brought to the operating room and placed supine on the operating table.  General anesthesia was induced under invasive hemodynamic monitoring.  A double-lumen endotracheal tube was placed by the Anesthesia team.  The patient was turned left side up. The left chest was prepped and draped as a sterile field.  A proper time- out was performed.  A small incision was made at the tip of the left scapula and the pleural space entered.  There was complete obliteration of the pleural space.  A pocket of serosanguineous fluid was entered and this was drained and submitted for studies.  The incision was extended to approximately 3 inches and the pleural space was gradually and carefully opened and the left lung mobilized.  Pockets of glue-like material as well as purulent pus were encountered and drained.  A peel over the left lower lobe was fairly thick and organized, and this was dissected off using a longitudinal incision in the bursal pleura to start the plane of dissection.  The peel was submitted for Pathology and Cultures.  The entire left hemithorax was then irrigated with copious amounts of warm saline.  A scope and camera were inserted to inspect for any other areas for debridement.  After debridement was completed, 2  chest tubes were placed to drain the left pleural space superiorly and inferiorly and brought out through separate incisions.  The raw area on the left lower lobe after removing the peel was covered with a fine layer of medical adhesive-Coseal to prevent air leaks.  The lung was then re-expanded and the ventilator resumed for both lungs.  The ribs were reapproximated with a single pericostal #2 Vicryl.  The 2 muscle layers were closed with interrupted #1 Vicryl.  The subcutaneous and skin layers were closed in running Vicryl.  An On-Q catheter was placed in the chest wall musculature, secured to the skin and connected to a reservoir 0.5% Marcaine.  The chest tubes were  connected to an underwater seal Pleur-evac drainage system.  The patient was then turned supine, reversed from anesthesia, extubated, observed, and then returned to recovery room in stable condition.     Ivin Poot, M.D.   ______________________________ Ivin Poot, M.D.    PV/MEDQ  D:  01/11/2016  T:  01/12/2016  Job:  [id number]

## 2016-01-12 NOTE — Progress Notes (Signed)
Patient ID: Dillon [name], male   DOB: 1933/02/02, 82 y.o.   MRN: [id number] EVENING ROUNDS NOTE :     [address]       [place] [address]             [phone]                 1 Day Post-Op Procedure(s) (LRB): VIDEO ASSISTED THORACOSCOPY (VATS)/DECORTICATION (Left) EMPYEMA DRAINAGE (Left)  Total Length of Stay:  LOS: 5 days  BP (!) 149/65   Pulse (!) 26   Temp 98.6 F (37 C) (Oral)   Resp (!) 24   Ht 5' 5.5" (1.664 m)   Wt 175 lb 4.3 oz (79.5 kg)   SpO2 94%   BMI 28.72 kg/m   .Intake/Output      12/16 0701 - 12/17 0700   P.O. 1440   I.V. (mL/kg) 195.5 (2.5)   IV Piggyback 50   Total Intake(mL/kg) 1685.5 (21.2)   Urine (mL/kg/hr) 1350 (1.4)   Blood    Chest Tube 110 (0.1)   Total Output 1460   Net +225.5         . dextrose 5 % and 0.9% NaCl 10 mL/hr (01/12/16 0757)     Lab Results  Component Value Date   WBC 17.2 (H) 01/12/2016   HGB 8.7 (L) 01/12/2016   HCT 26.4 (L) 01/12/2016   PLT 455 (H) 01/12/2016   GLUCOSE 182 (H) 01/12/2016   CHOL 145 01/18/2014   TRIG 70 01/18/2014   HDL 64 01/18/2014   LDLCALC 67 01/18/2014   ALT 101 (H) 01/10/2016   AST 107 (H) 01/10/2016   NA 132 (L) 01/12/2016   K 3.6 01/12/2016   CL 102 01/12/2016   CREATININE 1.38 (H) 01/12/2016   BUN 12 01/12/2016   CO2 23 01/12/2016   TSH 4.322 01/10/2016   INR 1.23 01/10/2016   HGBA1C 7.2 (H) 01/10/2016   Walked 6 times around unit   Grace Isaac MD  Beeper [phone] Office [phone] 01/12/2016 7:21 PM

## 2016-01-12 NOTE — Progress Notes (Signed)
Patient ID: Dillon [name], male   DOB: April 30, 1933, 82 y.o.   MRN: [id number] TCTS DAILY ICU PROGRESS NOTE                   [address]            [place] [address]          [phone]   1 Day Post-Op Procedure(s) (LRB): VIDEO ASSISTED THORACOSCOPY (VATS)/DECORTICATION (Left) EMPYEMA DRAINAGE (Left)  Total Length of Stay:  LOS: 5 days   Subjective: Alert and up in chair , walked 45 this am  Objective: Vital signs in last 24 hours: Temp:  [97.1 F (36.2 C)-98.6 F (37 C)] 97.9 F (36.6 C) (12/16 0335) Pulse Rate:  [32-94] 34 (12/16 0600) Cardiac Rhythm: Normal sinus rhythm (12/16 0400) Resp:  [11-21] 18 (12/16 0753) BP: (103-134)/(49-77) 120/53 (12/16 0600) SpO2:  [82 %-99 %] 99 % (12/16 0753) Arterial Line BP: (57-171)/(35-84) 67/58 (12/16 0600) Weight:  [175 lb 4.3 oz (79.5 kg)] 175 lb 4.3 oz (79.5 kg) (12/16 0600)  Filed Weights   01/09/16 2040 01/10/16 2110 01/12/16 0600  Weight: 172 lb 9.6 oz (78.3 kg) 172 lb 13.5 oz (78.4 kg) 175 lb 4.3 oz (79.5 kg)    Weight change: 2 lb 6.8 oz (1.1 kg)   Hemodynamic parameters for last 24 hours:    Intake/Output from previous day: 12/15 0701 - 12/16 0700 In: [id number] [P.O.:120; I.V.:3000; IV Piggyback:615] Out: [id number] [Urine:960; Blood:150; Chest Tube:340]  Intake/Output this shift: No intake/output data recorded.  Current Meds: Scheduled Meds: . acetaminophen  1,000 mg Oral Q6H   Or  . acetaminophen (TYLENOL) oral liquid 160 mg/5 mL  1,000 mg Oral Q6H  . amLODipine  5 mg Oral QHS  . aspirin EC  81 mg Oral QHS  . bisacodyl  10 mg Oral Daily  . fentaNYL   Intravenous Q4H  . furosemide  40 mg Intravenous Once  . insulin aspart  0-24 Units Subcutaneous Q6H  . levothyroxine  112 mcg Oral QAC breakfast  . mouth rinse  15 mL Mouth Rinse BID  . metoprolol succinate  25 mg Oral Daily  . niacin  1,000 mg Oral QHS  . pantoprazole  40 mg Oral Daily  . piperacillin-tazobactam (ZOSYN)  IV  3.375 g Intravenous Q8H    . rosuvastatin  40 mg Oral QHS  . senna-docusate  1 tablet Oral QHS  . sodium chloride flush  10-40 mL Intracatheter Q12H  . vancomycin  1,000 mg Intravenous QHS   Continuous Infusions: . dextrose 5 % and 0.9% NaCl 100 mL/hr at 01/12/16 0700   PRN Meds:.diphenhydrAMINE **OR** diphenhydrAMINE, naloxone **AND** sodium chloride flush, ondansetron (ZOFRAN) IV, oxyCODONE, potassium chloride (KCL MULTIRUN) 30 mEq in 265 mL IVPB, sodium chloride flush, traMADol  General appearance: alert, cooperative and no distress Neurologic: intact Heart: regular rate and rhythm, S1, S2 normal, no murmur, click, rub or gallop Lungs: diminished breath sounds bibasilar Abdomen: soft, non-tender; bowel sounds normal; no masses,  no organomegaly Extremities: extremities normal, atraumatic, no cyanosis or edema and Homans sign is negative, no sign of DVT Wound: no air leak  Lab Results: CBC: Recent Labs  01/10/16 0916 01/12/16 0440  WBC 23.8* 17.2*  HGB 10.3* 8.7*  HCT 31.2* 26.4*  PLT 531* 455*   BMET:  Recent Labs  01/10/16 0916 01/12/16 0440  NA 134* 132*  K 4.3 3.6  CL 103 102  CO2 23 23  GLUCOSE 266* 182*  BUN  13 12  CREATININE 1.52* 1.38*  CALCIUM 9.2 8.8*    CMET: Lab Results  Component Value Date   WBC 17.2 (H) 01/12/2016   HGB 8.7 (L) 01/12/2016   HCT 26.4 (L) 01/12/2016   PLT 455 (H) 01/12/2016   GLUCOSE 182 (H) 01/12/2016   CHOL 145 01/18/2014   TRIG 70 01/18/2014   HDL 64 01/18/2014   LDLCALC 67 01/18/2014   ALT 101 (H) 01/10/2016   AST 107 (H) 01/10/2016   NA 132 (L) 01/12/2016   K 3.6 01/12/2016   CL 102 01/12/2016   CREATININE 1.38 (H) 01/12/2016   BUN 12 01/12/2016   CO2 23 01/12/2016   TSH 4.322 01/10/2016   INR 1.23 01/10/2016   HGBA1C 7.2 (H) 01/10/2016    PT/INR:  Recent Labs  01/10/16 0559  LABPROT 15.6*  INR 1.23   Radiology: Dg Chest Port 1 View  Result Date: 01/12/2016 CLINICAL DATA:  Followup a pneumothorax. EXAM: PORTABLE CHEST 1 VIEW  COMPARISON:  01/11/2016 FINDINGS: Left chest tubes remain in place. Left internal jugular central line unchanged with tip in the SVC. No visible pneumothorax on today's film. Left lower lobe volume loss as seen previously. Mild atelectasis in the right lower lung. IMPRESSION: Lines and tubes unchanged. No visible pneumothorax today. Persistent infiltrate and volume loss in left lower lung. Electronically Signed   By: Nelson Chimes M.D.   On: 01/12/2016 07:40   Dg Chest Port 1 View  Result Date: 01/11/2016 CLINICAL DATA:  Status post left VATS. Left chest to and central line placement. EXAM: PORTABLE CHEST 1 VIEW COMPARISON:  Chest CT 01/07/2016 and single-view of the chest 01/08/2016. FINDINGS: New left IJ catheter is in place with the tip projecting in the mid superior vena cava. The patient also has 2 new left chest tubes. Very small left apical pneumothorax is noted. Left pleural effusion and basilar airspace disease are decreased. Minimal atelectasis in the right mid lung is noted. Heart size is normal. IMPRESSION: Left IJ catheter tip projects in the mid superior vena cava. Two left chest tubes in place. Very small left apical pneumothorax is seen. Decreased left pleural effusion and airspace disease after VATS. Electronically Signed   By: Inge Rise M.D.   On: 01/11/2016 10:37   Chronic Kidney Disease   Stage I     GFR >90  Stage II    GFR 60-89  Stage IIIA GFR 45-59  Stage IIIB GFR 30-44  Stage IV   GFR 15-29  Stage V    GFR  <15  Lab Results  Component Value Date   CREATININE 1.38 (H) 01/12/2016   Estimated Creatinine Clearance: 41.9 mL/min (by C-G formula based on SCr of 1.38 mg/dL (H)).  Recent Results (from the past 240 hour(s))  Blood culture (routine x 2)     Status: None (Preliminary result)   Collection Time: 01/07/16  2:20 PM  Result Value Ref Range Status   Specimen Description BLOOD LEFT FOREARM  Final   Special Requests BOTTLES DRAWN AEROBIC AND ANAEROBIC 5CC  Final     Culture NO GROWTH 4 DAYS  Final   Report Status PENDING  Incomplete  Blood culture (routine x 2)     Status: None (Preliminary result)   Collection Time: 01/07/16  3:05 PM  Result Value Ref Range Status   Specimen Description BLOOD RIGHT FOREARM  Final   Special Requests BOTTLES DRAWN AEROBIC AND ANAEROBIC 5CC  Final   Culture NO GROWTH 4 DAYS  Final   Report Status PENDING  Incomplete  Culture, body fluid-bottle     Status: None (Preliminary result)   Collection Time: 01/08/16 11:29 AM  Result Value Ref Range Status   Specimen Description PLEURAL LEFT  Final   Special Requests NONE  Final   Culture NO GROWTH 3 DAYS  Final   Report Status PENDING  Incomplete  Gram stain     Status: None   Collection Time: 01/08/16 11:29 AM  Result Value Ref Range Status   Specimen Description PLEURAL LEFT  Final   Special Requests NONE  Final   Gram Stain   Final    FEW WBC PRESENT,BOTH PMN AND MONONUCLEAR NO ORGANISMS SEEN    Report Status 01/09/2016 FINAL  Final  Surgical pcr screen     Status: None   Collection Time: 01/10/16  6:02 AM  Result Value Ref Range Status   MRSA, PCR NEGATIVE NEGATIVE Final   Staphylococcus aureus NEGATIVE NEGATIVE Final    Comment:        The Xpert SA Assay (FDA approved for NASAL specimens in patients over 32 years of age), is one component of a comprehensive surveillance program.  Test performance has been validated by [facility] for patients greater than or equal to 79 year old. It is not intended to diagnose infection nor to guide or monitor treatment.   Gram stain     Status: None   Collection Time: 01/11/16  8:34 AM  Result Value Ref Range Status   Specimen Description PLEURAL LEFT  Final   Special Requests NONE  Final   Gram Stain   Final    ABUNDANT WBC PRESENT, PREDOMINANTLY PMN NO ORGANISMS SEEN    Report Status 01/12/2016 FINAL  Final  Aerobic/Anaerobic Culture (surgical/deep wound)     Status: None (Preliminary result)   Collection  Time: 01/11/16  8:42 AM  Result Value Ref Range Status   Specimen Description PLEURAL LEFT  Final   Special Requests SPEC C ON SWABS  Final   Gram Stain   Final    ABUNDANT WBC PRESENT, PREDOMINANTLY PMN MODERATE GRAM VARIABLE COCCI    Culture PENDING  Incomplete   Report Status PENDING  Incomplete  Aerobic Culture (superficial specimen)     Status: None (Preliminary result)   Collection Time: 01/11/16  8:55 AM  Result Value Ref Range Status   Specimen Description TISSUE LEFT PLEURAL  Final   Special Requests   Final    LEFT PLEURAL PEEL PATIENT ON FOLLOWING  ZINACEF AZITHROMYCIN AND CEFTIN   Gram Stain   Final    ABUNDANT WBC PRESENT,BOTH PMN AND MONONUCLEAR NO ORGANISMS SEEN    Culture PENDING  Incomplete   Report Status PENDING  Incomplete    Assessment/Plan: S/P Procedure(s) (LRB): VIDEO ASSISTED THORACOSCOPY (VATS)/DECORTICATION (Left) EMPYEMA DRAINAGE (Left) Mobilize Diuresis Continue foley due to diuresing patient See progression orders Expected Acute  Blood - loss Anemia Chronic kidney disease stage III- stable today cr 1.38 Cultures negative so far  Grace Isaac 01/12/2016 7:58 AM

## 2016-01-13 ENCOUNTER — Inpatient Hospital Stay (HOSPITAL_COMMUNITY): Payer: Medicare Other

## 2016-01-13 LAB — GLUCOSE, CAPILLARY
GLUCOSE-CAPILLARY: 187 mg/dL — AB (ref 65–99)
Glucose-Capillary: 133 mg/dL — ABNORMAL HIGH (ref 65–99)
Glucose-Capillary: 162 mg/dL — ABNORMAL HIGH (ref 65–99)

## 2016-01-13 LAB — COMPREHENSIVE METABOLIC PANEL
ALK PHOS: 128 U/L — AB (ref 38–126)
ALT: 60 U/L (ref 17–63)
AST: 39 U/L (ref 15–41)
Albumin: 1.5 g/dL — ABNORMAL LOW (ref 3.5–5.0)
Anion gap: 13 (ref 5–15)
BILIRUBIN TOTAL: 0.5 mg/dL (ref 0.3–1.2)
BUN: 14 mg/dL (ref 6–20)
CALCIUM: 8.7 mg/dL — AB (ref 8.9–10.3)
CHLORIDE: 99 mmol/L — AB (ref 101–111)
CO2: 21 mmol/L — ABNORMAL LOW (ref 22–32)
CREATININE: 1.56 mg/dL — AB (ref 0.61–1.24)
GFR calc Af Amer: 47 mL/min — ABNORMAL LOW (ref >60)
GFR, EST NON AFRICAN AMERICAN: 40 mL/min — AB (ref >60)
Glucose, Bld: 100 mg/dL — ABNORMAL HIGH (ref 65–99)
Potassium: 3.2 mmol/L — ABNORMAL LOW (ref 3.5–5.1)
Sodium: 133 mmol/L — ABNORMAL LOW (ref 135–145)
TOTAL PROTEIN: 5.8 g/dL — AB (ref 6.5–8.1)

## 2016-01-13 LAB — ACID FAST SMEAR (AFB, MYCOBACTERIA): Acid Fast Smear: NEGATIVE

## 2016-01-13 LAB — CULTURE, BODY FLUID-BOTTLE

## 2016-01-13 LAB — CULTURE, BODY FLUID W GRAM STAIN -BOTTLE: Culture: NO GROWTH

## 2016-01-13 LAB — AEROBIC CULTURE W GRAM STAIN (SUPERFICIAL SPECIMEN): Culture: NO GROWTH

## 2016-01-13 LAB — CBC
HEMATOCRIT: 27.7 % — AB (ref 39.0–52.0)
HEMOGLOBIN: 9 g/dL — AB (ref 13.0–17.0)
MCH: 30.5 pg (ref 26.0–34.0)
MCHC: 32.5 g/dL (ref 30.0–36.0)
MCV: 93.9 fL (ref 78.0–100.0)
Platelets: 446 10*3/uL — ABNORMAL HIGH (ref 150–400)
RBC: 2.95 MIL/uL — AB (ref 4.22–5.81)
RDW: 14.5 % (ref 11.5–15.5)
WBC: 19.4 10*3/uL — AB (ref 4.0–10.5)

## 2016-01-13 MED ORDER — POTASSIUM CHLORIDE ER 10 MEQ PO TBCR
10.0000 meq | EXTENDED_RELEASE_TABLET | Freq: Every day | ORAL | Status: DC
  Administered 2016-01-13 – 2016-01-14 (×2): 10 meq via ORAL
  Filled 2016-01-13 (×3): qty 1

## 2016-01-13 MED ORDER — FUROSEMIDE 10 MG/ML IJ SOLN
40.0000 mg | Freq: Once | INTRAMUSCULAR | Status: AC
  Administered 2016-01-13: 40 mg via INTRAVENOUS
  Filled 2016-01-13: qty 4

## 2016-01-13 MED ORDER — SODIUM CHLORIDE 0.9 % IV SOLN
30.0000 meq | Freq: Once | INTRAVENOUS | Status: DC
  Filled 2016-01-13: qty 15

## 2016-01-13 MED ORDER — POTASSIUM CHLORIDE CRYS ER 20 MEQ PO TBCR: 20.0000 meq | EXTENDED_RELEASE_TABLET | Freq: Once | ORAL | Status: AC

## 2016-01-13 MED ORDER — SENNOSIDES-DOCUSATE SODIUM 8.6-50 MG PO TABS
2.0000 | ORAL_TABLET | Freq: Two times a day (BID) | ORAL | Status: DC
  Administered 2016-01-13: 2 via ORAL
  Filled 2016-01-13: qty 2

## 2016-01-13 MED ORDER — GUAIFENESIN ER 600 MG PO TB12
600.0000 mg | ORAL_TABLET | Freq: Two times a day (BID) | ORAL | Status: DC
  Administered 2016-01-13 – 2016-01-16 (×7): 600 mg via ORAL
  Filled 2016-01-13 (×7): qty 1

## 2016-01-13 MED ORDER — ENOXAPARIN SODIUM 30 MG/0.3ML ~~LOC~~ SOLN
30.0000 mg | SUBCUTANEOUS | Status: DC
  Administered 2016-01-14 – 2016-01-15 (×2): 30 mg via SUBCUTANEOUS
  Filled 2016-01-13 (×2): qty 0.3

## 2016-01-13 MED ORDER — ENOXAPARIN SODIUM 30 MG/0.3ML ~~LOC~~ SOLN: 30.0000 mg | SUBCUTANEOUS | Status: DC

## 2016-01-13 MED ORDER — POTASSIUM CHLORIDE CRYS ER 20 MEQ PO TBCR
20.0000 meq | EXTENDED_RELEASE_TABLET | Freq: Once | ORAL | Status: AC
  Administered 2016-01-13: 20 meq via ORAL
  Filled 2016-01-13: qty 1

## 2016-01-13 MED ORDER — ENOXAPARIN SODIUM 40 MG/0.4ML ~~LOC~~ SOLN
40.0000 mg | SUBCUTANEOUS | Status: DC
  Administered 2016-01-13: 40 mg via SUBCUTANEOUS
  Filled 2016-01-13: qty 0.4

## 2016-01-13 MED ORDER — POLYETHYLENE GLYCOL 3350 17 G PO PACK
17.0000 g | PACK | Freq: Every day | ORAL | Status: DC
  Filled 2016-01-13: qty 1

## 2016-01-13 NOTE — Progress Notes (Signed)
Patient ID: Dillon [name], male   DOB: 07/02/1933, 82 y.o.   MRN: [id number] TCTS DAILY ICU PROGRESS NOTE                   [address]            [place] [address]          [phone]   2 Days Post-Op Procedure(s) (LRB): VIDEO ASSISTED THORACOSCOPY (VATS)/DECORTICATION (Left) EMPYEMA DRAINAGE (Left)  Total Length of Stay:  LOS: 6 days   Subjective: Up in chair walked 5 times around unit this am, coughing more   Objective: Vital signs in last 24 hours: Temp:  [97.4 F (36.3 C)-99 F (37.2 C)] 98.3 F (36.8 C) (12/17 0410) Pulse Rate:  [26-91] 91 (12/17 0700) Cardiac Rhythm: Normal sinus rhythm (12/16 2000) Resp:  [13-24] 24 (12/17 0700) BP: (103-149)/(45-126) 143/126 (12/17 0700) SpO2:  [83 %-97 %] 94 % (12/17 0700) Arterial Line BP: (144)/(56) 144/56 (12/16 0900) Weight:  [175 lb 0.7 oz (79.4 kg)] 175 lb 0.7 oz (79.4 kg) (12/17 0200)  Filed Weights   01/10/16 2110 01/12/16 0600 01/13/16 0200  Weight: 172 lb 13.5 oz (78.4 kg) 175 lb 4.3 oz (79.5 kg) 175 lb 0.7 oz (79.4 kg)    Weight change: -3.5 oz (-0.1 kg)   Hemodynamic parameters for last 24 hours:    Intake/Output from previous day: 12/16 0701 - 12/17 0700 In: 2115.5 [P.O.:1440; I.V.:325.5; IV Piggyback:350] Out: 1900 [Urine:1750; Chest Tube:150]  Intake/Output this shift: No intake/output data recorded.  Current Meds: Scheduled Meds: . acetaminophen  1,000 mg Oral Q6H   Or  . acetaminophen (TYLENOL) oral liquid 160 mg/5 mL  1,000 mg Oral Q6H  . amLODipine  5 mg Oral QHS  . aspirin EC  81 mg Oral QHS  . bisacodyl  10 mg Oral Daily  . enoxaparin (LOVENOX) injection  30 mg Subcutaneous Q24H  . fentaNYL   Intravenous Q4H  . furosemide  40 mg Intravenous Once  . guaiFENesin  600 mg Oral BID  . insulin aspart  0-24 Units Subcutaneous Q6H  . levothyroxine  112 mcg Oral QAC breakfast  . mouth rinse  15 mL Mouth Rinse BID  . metoprolol succinate  25 mg Oral Daily  . niacin  1,000 mg Oral  QHS  . pantoprazole  40 mg Oral Daily  . piperacillin-tazobactam (ZOSYN)  IV  3.375 g Intravenous Q8H  . rosuvastatin  40 mg Oral QHS  . senna-docusate  1 tablet Oral QHS  . sodium chloride flush  10-40 mL Intracatheter Q12H  . vancomycin  1,000 mg Intravenous QHS   Continuous Infusions: . dextrose 5 % and 0.9% NaCl 10 mL/hr at 01/13/16 0700   PRN Meds:.diphenhydrAMINE **OR** diphenhydrAMINE, naloxone **AND** sodium chloride flush, ondansetron (ZOFRAN) IV, oxyCODONE, potassium chloride (KCL MULTIRUN) 30 mEq in 265 mL IVPB, sodium chloride flush, traMADol  General appearance: alert, cooperative and no distress Neurologic: intact Heart: regular rate and rhythm, S1, S2 normal, no murmur, click, rub or gallop Lungs: diminished breath sounds LLL Abdomen: soft, non-tender; bowel sounds normal; no masses,  no organomegaly and mild distention but with bowel sounds  Extremities: extremities normal, atraumatic, no cyanosis or edema and Homans sign is negative, no sign of DVT Wound: no air leak  Lab Results: CBC: Recent Labs  01/12/16 0440 01/13/16 0500  WBC 17.2* 19.4*  HGB 8.7* 9.0*  HCT 26.4* 27.7*  PLT 455* 446*   BMET:  Recent Labs  01/12/16  0440 01/13/16 0500  NA 132* 133*  K 3.6 3.2*  CL 102 99*  CO2 23 21*  GLUCOSE 182* 100*  BUN 12 14  CREATININE 1.38* 1.56*  CALCIUM 8.8* 8.7*    CMET: Lab Results  Component Value Date   WBC 19.4 (H) 01/13/2016   HGB 9.0 (L) 01/13/2016   HCT 27.7 (L) 01/13/2016   PLT 446 (H) 01/13/2016   GLUCOSE 100 (H) 01/13/2016   CHOL 145 01/18/2014   TRIG 70 01/18/2014   HDL 64 01/18/2014   LDLCALC 67 01/18/2014   ALT 60 01/13/2016   AST 39 01/13/2016   NA 133 (L) 01/13/2016   K 3.2 (L) 01/13/2016   CL 99 (L) 01/13/2016   CREATININE 1.56 (H) 01/13/2016   BUN 14 01/13/2016   CO2 21 (L) 01/13/2016   TSH 4.322 01/10/2016   INR 1.23 01/10/2016   HGBA1C 7.2 (H) 01/10/2016    PT/INR: No results for input(s): LABPROT, INR in the last  72 hours. Radiology: Dg Chest Port 1 View  Result Date: 01/13/2016 CLINICAL DATA:  Status post left VATS and chest tube placement. EXAM: PORTABLE CHEST 1 VIEW COMPARISON:  Yesterday. FINDINGS: Two left chest tubes remain in place. An approximately 5% left lateral and apical pneumothorax is demonstrated. No significant change in patchy opacity at the left lung base. Clear right lung. Mildly enlarged cardiac silhouette. Post CABG changes. Left jugular catheter tip in the superior vena cava. Left apical surgical clips and staples. Unremarkable bones. IMPRESSION: 1. Interval approximately 5% left pneumothorax. 2. Stable left basilar atelectasis, aspiration pneumonitis or pneumonia. Electronically Signed   By: Claudie Revering M.D.   On: 01/13/2016 07:17   Chronic Kidney Disease   Stage I     GFR >90  Stage II    GFR 60-89  Stage IIIA GFR 45-59  Stage IIIB GFR 30-44  Stage IV   GFR 15-29  Stage V    GFR  <15  Lab Results  Component Value Date   CREATININE 1.56 (H) 01/13/2016   Estimated Creatinine Clearance: 37.1 mL/min (by C-G formula based on SCr of 1.56 mg/dL (H)).  Recent Results (from the past 240 hour(s))  Blood culture (routine x 2)     Status: None   Collection Time: 01/07/16  2:20 PM  Result Value Ref Range Status   Specimen Description BLOOD LEFT FOREARM  Final   Special Requests BOTTLES DRAWN AEROBIC AND ANAEROBIC 5CC  Final   Culture NO GROWTH 5 DAYS  Final   Report Status 01/12/2016 FINAL  Final  Blood culture (routine x 2)     Status: None   Collection Time: 01/07/16  3:05 PM  Result Value Ref Range Status   Specimen Description BLOOD RIGHT FOREARM  Final   Special Requests BOTTLES DRAWN AEROBIC AND ANAEROBIC 5CC  Final   Culture NO GROWTH 5 DAYS  Final   Report Status 01/12/2016 FINAL  Final  Culture, body fluid-bottle     Status: None (Preliminary result)   Collection Time: 01/08/16 11:29 AM  Result Value Ref Range Status   Specimen Description PLEURAL LEFT  Final    Special Requests NONE  Final   Culture NO GROWTH 4 DAYS  Final   Report Status PENDING  Incomplete  Gram stain     Status: None   Collection Time: 01/08/16 11:29 AM  Result Value Ref Range Status   Specimen Description PLEURAL LEFT  Final   Special Requests NONE  Final   Gram Stain  Final    FEW WBC PRESENT,BOTH PMN AND MONONUCLEAR NO ORGANISMS SEEN    Report Status 01/09/2016 FINAL  Final  Surgical pcr screen     Status: None   Collection Time: 01/10/16  6:02 AM  Result Value Ref Range Status   MRSA, PCR NEGATIVE NEGATIVE Final   Staphylococcus aureus NEGATIVE NEGATIVE Final    Comment:        The Xpert SA Assay (FDA approved for NASAL specimens in patients over 80 years of age), is one component of a comprehensive surveillance program.  Test performance has been validated by [facility] for patients greater than or equal to 32 year old. It is not intended to diagnose infection nor to guide or monitor treatment.   Acid Fast Smear (AFB)     Status: None   Collection Time: 01/11/16  8:34 AM  Result Value Ref Range Status   AFB Specimen Processing Concentration  Final   Acid Fast Smear Negative  Final    Comment: (NOTE) Performed At: [facility] [address] [place], [place] [id number] Lindon Romp MD [id number]    Source (AFB) PLEURAL  Final    Comment: LEFT  Culture, body fluid-bottle     Status: None (Preliminary result)   Collection Time: 01/11/16  8:34 AM  Result Value Ref Range Status   Specimen Description PLEURAL LEFT  Final   Special Requests NONE  Final   Culture NO GROWTH 1 DAY  Final   Report Status PENDING  Incomplete  Gram stain     Status: None   Collection Time: 01/11/16  8:34 AM  Result Value Ref Range Status   Specimen Description PLEURAL LEFT  Final   Special Requests NONE  Final   Gram Stain   Final    ABUNDANT WBC PRESENT, PREDOMINANTLY PMN NO ORGANISMS SEEN    Report Status 01/12/2016 FINAL  Final  Aerobic/Anaerobic  Culture (surgical/deep wound)     Status: None (Preliminary result)   Collection Time: 01/11/16  8:42 AM  Result Value Ref Range Status   Specimen Description PLEURAL LEFT  Final   Special Requests SPEC C ON SWABS  Final   Gram Stain   Final    ABUNDANT WBC PRESENT, PREDOMINANTLY PMN MODERATE GRAM VARIABLE COCCI    Culture NO GROWTH 1 DAY  Final   Report Status PENDING  Incomplete  Aerobic Culture (superficial specimen)     Status: None (Preliminary result)   Collection Time: 01/11/16  8:55 AM  Result Value Ref Range Status   Specimen Description TISSUE LEFT PLEURAL  Final   Special Requests   Final    LEFT PLEURAL PEEL PATIENT ON FOLLOWING  ZINACEF AZITHROMYCIN AND CEFTIN   Gram Stain   Final    ABUNDANT WBC PRESENT,BOTH PMN AND MONONUCLEAR NO ORGANISMS SEEN    Culture NO GROWTH < 24 HOURS  Final   Report Status PENDING  Incomplete   Assessment/Plan: S/P Procedure(s) (LRB): VIDEO ASSISTED THORACOSCOPY (VATS)/DECORTICATION (Left) EMPYEMA DRAINAGE (Left) Mobilize Diuresis Continue ABX therapy due to preop infection  Replace k  Acute on ckd  Reduced dose Lovenox for dvt prophylaxis  Cultures all negative so far   Grace Isaac 01/13/2016 8:54 AM

## 2016-01-13 NOTE — Progress Notes (Signed)
D/w CVTS, TRH will FU/pick up when moves out of ICU, likely soon  Domenic Polite, MD

## 2016-01-13 NOTE — Progress Notes (Addendum)
PROGRESS NOTE    Dillon [name]  [id number] DOB: 04/09/1933 DOA: 01/07/2016 PCP: Daphene Calamity, MD     Brief Narrative:  Dillon [name] is a 82 y.o. male with medical history significant of CAD s/p CABG, HTN, HLD, hypothyroidism who started having productive cough of yellow sputum about 2 weeks ago.  Chest x-ray results came back and showed pneumonia as well as possible empyema. CT chest was obtained. CTS was consulted by EDP recommended thoracentesis. -s/p VATs with decortication 12/15  Assessment & Plan:   Sepsis secondary to empyema/loculated left pleural effusion -sepsis physiology resolved -was on  rocephin/azithromax for 5days changed to Vanc/Zosyn 12/15 -Blood cultures NGTD -s/p Thoracentesis 12/12: 50cc turbid fluid drained, extensive loculated collection,  -FU pleural fluid cultures-No growth x2days -s/p VATs with decortication 12/15 -appreciate CVTS care  Constipation -+Bm, continue miralax, stool softeners   Acute kidney injury  -Baseline Cr 1.1 -In setting of sepsis -creatinine 1.5 today  CAD s/p CABG -Aspirin, will hold plavix due to thoracentesis and need for procedures  HTN -Continue norvasc, metoprolol  -Held cozaar due to AKI   Hypothyroidism -Continue synthroid  HLD -Continue crestor, niacin   Possible pancreatic tail mass versus normal variant on CT -Recommend follow-up abdominal CT with contrast in near future (at time of follow up CT chest possibly)   DVT prophylaxis: subq hep Code Status: Full Family Communication: wife at bedside Disposition Plan: Transfer to SDU or Tele per CVTS   Consultants:   CVTS  Procedures:   None  Antimicrobials:   Vanco/cefepime 12/11 >>    Subjective: Feels bloated and constipated   Objective: Vitals:   01/13/16 0900 01/13/16 1000 01/13/16 1100 01/13/16 1200  BP: (!) 130/98 (!) 107/53 (!) 102/55 (!) 101/59  Pulse: 93 (!) 36 70   Resp: 20 16 19 16   Temp:      TempSrc:      SpO2:  95% 94% 95% 92%  Weight:      Height:        Intake/Output Summary (Last 24 hours) at 01/13/16 1305 Last data filed at 01/13/16 1200  Gross per 24 hour  Intake              820 ml  Output             1720 ml  Net             -900 ml   Filed Weights   01/10/16 2110 01/12/16 0600 01/13/16 0200  Weight: 78.4 kg (172 lb 13.5 oz) 79.5 kg (175 lb 4.3 oz) 79.4 kg (175 lb 0.7 oz)    Examination:  General exam: Appears calm and comfortable, AAOx3, no distress Respiratory system:  Diminished breath sounds on left, chest tube noted Cardiovascular system: S1 & S2 heard, RRR. No JVD, murmurs, rubs, gallops or clicks. No pedal edema. Gastrointestinal system: Abdomen is distended, soft and nontender.  Normal bowel sounds heard. Central nervous system: Alert and oriented. No focal neurological deficits. Extremities: Symmetric 5 x 5 power. Skin: No rashes, lesions or ulcers Psychiatry: Judgement and insight appear normal. Mood & affect appropriate.   Data Reviewed: I have personally reviewed following labs and imaging studies  CBC:  Recent Labs Lab 01/07/16 1244  01/09/16 0940 01/10/16 0354 01/10/16 0916 01/12/16 0440 01/13/16 0500  WBC 25.4*  < > 22.9* 18.1* 23.8* 17.2* 19.4*  NEUTROABS 22.8*  --  20.4* 16.0*  --   --   --   HGB 10.1*  < > 9.5* 9.2*  10.3* 8.7* 9.0*  HCT 30.1*  < > 28.8* 28.3* 31.2* 26.4* 27.7*  MCV 93.2  < > 94.4 95.0 95.1 94.3 93.9  PLT 516*  < > 500* 455* 531* 455* 446*  < > = values in this interval not displayed. Basic Metabolic Panel:  Recent Labs Lab 01/09/16 0940 01/10/16 0354 01/10/16 0916 01/12/16 0440 01/13/16 0500  NA 134* 133* 134* 132* 133*  K 4.0 3.9 4.3 3.6 3.2*  CL 105 102 103 102 99*  CO2 20* 22 23 23  21*  GLUCOSE 244* 156* 266* 182* 100*  BUN 16 15 13 12 14   CREATININE 1.59* 1.50* 1.52* 1.38* 1.56*  CALCIUM 8.9 8.6* 9.2 8.8* 8.7*   GFR: Estimated Creatinine Clearance: 37.1 mL/min (by C-G formula based on SCr of 1.56 mg/dL  (H)). Liver Function Tests:  Recent Labs Lab 01/07/16 1244 01/08/16 0500 01/10/16 0916 01/13/16 0500  AST 46* 61* 107* 39  ALT 71* 67* 101* 60  ALKPHOS 148* 145* 188* 128*  BILITOT 0.3 0.2* 0.3 0.5  PROT 6.8 6.0* 6.7 5.8*  ALBUMIN 2.3* 1.9* 2.0* 1.5*   No results for input(s): LIPASE, AMYLASE in the last 168 hours. No results for input(s): AMMONIA in the last 168 hours. Coagulation Profile:  Recent Labs Lab 01/10/16 0559  INR 1.23   Cardiac Enzymes: No results for input(s): CKTOTAL, CKMB, CKMBINDEX, TROPONINI in the last 168 hours. BNP (last 3 results) No results for input(s): PROBNP in the last 8760 hours. HbA1C: No results for input(s): HGBA1C in the last 72 hours. CBG:  Recent Labs Lab 01/11/16 2303 01/12/16 0517 01/12/16 1253 01/12/16 1650 01/12/16 2337  GLUCAP 165* 167* 246* 208* 104*   Lipid Profile: No results for input(s): CHOL, HDL, LDLCALC, TRIG, CHOLHDL, LDLDIRECT in the last 72 hours. Thyroid Function Tests: No results for input(s): TSH, T4TOTAL, FREET4, T3FREE, THYROIDAB in the last 72 hours. Anemia Panel: No results for input(s): VITAMINB12, FOLATE, FERRITIN, TIBC, IRON, RETICCTPCT in the last 72 hours. Sepsis Labs: No results for input(s): PROCALCITON, LATICACIDVEN in the last 168 hours.  Recent Results (from the past 240 hour(s))  Blood culture (routine x 2)     Status: None   Collection Time: 01/07/16  2:20 PM  Result Value Ref Range Status   Specimen Description BLOOD LEFT FOREARM  Final   Special Requests BOTTLES DRAWN AEROBIC AND ANAEROBIC 5CC  Final   Culture NO GROWTH 5 DAYS  Final   Report Status 01/12/2016 FINAL  Final  Blood culture (routine x 2)     Status: None   Collection Time: 01/07/16  3:05 PM  Result Value Ref Range Status   Specimen Description BLOOD RIGHT FOREARM  Final   Special Requests BOTTLES DRAWN AEROBIC AND ANAEROBIC 5CC  Final   Culture NO GROWTH 5 DAYS  Final   Report Status 01/12/2016 FINAL  Final  Culture,  body fluid-bottle     Status: None (Preliminary result)   Collection Time: 01/08/16 11:29 AM  Result Value Ref Range Status   Specimen Description PLEURAL LEFT  Final   Special Requests NONE  Final   Culture NO GROWTH 4 DAYS  Final   Report Status PENDING  Incomplete  Gram stain     Status: None   Collection Time: 01/08/16 11:29 AM  Result Value Ref Range Status   Specimen Description PLEURAL LEFT  Final   Special Requests NONE  Final   Gram Stain   Final    FEW WBC PRESENT,BOTH PMN AND MONONUCLEAR NO  ORGANISMS SEEN    Report Status 01/09/2016 FINAL  Final  Surgical pcr screen     Status: None   Collection Time: 01/10/16  6:02 AM  Result Value Ref Range Status   MRSA, PCR NEGATIVE NEGATIVE Final   Staphylococcus aureus NEGATIVE NEGATIVE Final    Comment:        The Xpert SA Assay (FDA approved for NASAL specimens in patients over 36 years of age), is one component of a comprehensive surveillance program.  Test performance has been validated by [facility] for patients greater than or equal to 35 year old. It is not intended to diagnose infection nor to guide or monitor treatment.   Acid Fast Smear (AFB)     Status: None   Collection Time: 01/11/16  8:34 AM  Result Value Ref Range Status   AFB Specimen Processing Concentration  Final   Acid Fast Smear Negative  Final    Comment: (NOTE) Performed At: [facility] [address] [place], [place] [id number] Lindon Romp MD [id number]    Source (AFB) PLEURAL  Final    Comment: LEFT  Culture, body fluid-bottle     Status: None (Preliminary result)   Collection Time: 01/11/16  8:34 AM  Result Value Ref Range Status   Specimen Description PLEURAL LEFT  Final   Special Requests NONE  Final   Culture NO GROWTH 1 DAY  Final   Report Status PENDING  Incomplete  Gram stain     Status: None   Collection Time: 01/11/16  8:34 AM  Result Value Ref Range Status   Specimen Description PLEURAL LEFT  Final    Special Requests NONE  Final   Gram Stain   Final    ABUNDANT WBC PRESENT, PREDOMINANTLY PMN NO ORGANISMS SEEN    Report Status 01/12/2016 FINAL  Final  Aerobic/Anaerobic Culture (surgical/deep wound)     Status: None (Preliminary result)   Collection Time: 01/11/16  8:42 AM  Result Value Ref Range Status   Specimen Description PLEURAL LEFT  Final   Special Requests SPEC C ON SWABS  Final   Gram Stain   Final    ABUNDANT WBC PRESENT, PREDOMINANTLY PMN MODERATE GRAM VARIABLE COCCI    Culture NO GROWTH 1 DAY  Final   Report Status PENDING  Incomplete  Aerobic Culture (superficial specimen)     Status: None   Collection Time: 01/11/16  8:55 AM  Result Value Ref Range Status   Specimen Description TISSUE LEFT PLEURAL  Final   Special Requests   Final    LEFT PLEURAL PEEL PATIENT ON FOLLOWING  ZINACEF AZITHROMYCIN AND CEFTIN   Gram Stain   Final    ABUNDANT WBC PRESENT,BOTH PMN AND MONONUCLEAR NO ORGANISMS SEEN    Culture NO GROWTH 2 DAYS  Final   Report Status 01/13/2016 FINAL  Final       Radiology Studies: Dg Chest Port 1 View  Result Date: 01/13/2016 CLINICAL DATA:  Status post left VATS and chest tube placement. EXAM: PORTABLE CHEST 1 VIEW COMPARISON:  Yesterday. FINDINGS: Two left chest tubes remain in place. An approximately 5% left lateral and apical pneumothorax is demonstrated. No significant change in patchy opacity at the left lung base. Clear right lung. Mildly enlarged cardiac silhouette. Post CABG changes. Left jugular catheter tip in the superior vena cava. Left apical surgical clips and staples. Unremarkable bones. IMPRESSION: 1. Interval approximately 5% left pneumothorax. 2. Stable left basilar atelectasis, aspiration pneumonitis or pneumonia. Electronically Signed  By: Claudie Revering M.D.   On: 01/13/2016 07:17   Dg Chest Port 1 View  Result Date: 01/12/2016 CLINICAL DATA:  Followup a pneumothorax. EXAM: PORTABLE CHEST 1 VIEW COMPARISON:  01/11/2016 FINDINGS:  Left chest tubes remain in place. Left internal jugular central line unchanged with tip in the SVC. No visible pneumothorax on today's film. Left lower lobe volume loss as seen previously. Mild atelectasis in the right lower lung. IMPRESSION: Lines and tubes unchanged. No visible pneumothorax today. Persistent infiltrate and volume loss in left lower lung. Electronically Signed   By: Nelson Chimes M.D.   On: 01/12/2016 07:40      Scheduled Meds: . acetaminophen  1,000 mg Oral Q6H   Or  . acetaminophen (TYLENOL) oral liquid 160 mg/5 mL  1,000 mg Oral Q6H  . amLODipine  5 mg Oral QHS  . aspirin EC  81 mg Oral QHS  . bisacodyl  10 mg Oral Daily  . enoxaparin (LOVENOX) injection  40 mg Subcutaneous Q24H  . fentaNYL   Intravenous Q4H  . guaiFENesin  600 mg Oral BID  . insulin aspart  0-24 Units Subcutaneous Q6H  . levothyroxine  112 mcg Oral QAC breakfast  . mouth rinse  15 mL Mouth Rinse BID  . metoprolol succinate  25 mg Oral Daily  . niacin  1,000 mg Oral QHS  . pantoprazole  40 mg Oral Daily  . piperacillin-tazobactam (ZOSYN)  IV  3.375 g Intravenous Q8H  . potassium chloride  10 mEq Oral Daily  . [COMPLETED] potassium chloride  20 mEq Oral Once  . rosuvastatin  40 mg Oral QHS  . senna-docusate  1 tablet Oral QHS  . sodium chloride flush  10-40 mL Intracatheter Q12H  . vancomycin  1,000 mg Intravenous QHS   Continuous Infusions: . dextrose 5 % and 0.9% NaCl 10 mL/hr at 01/13/16 0700     LOS: 6 days    Time spent: 30 minutes   Domenic Polite, MD [facility] [email or web address] Password [email or web address] 01/13/2016, 1:05 PM

## 2016-01-13 NOTE — Progress Notes (Addendum)
Patient ID: Dillon [name], male   DOB: Jun 07, 1933, 82 y.o.   MRN: [id number] EVENING ROUNDS NOTE :     [address]       [place] [address]             [phone]                 2 Days Post-Op Procedure(s) (LRB): VIDEO ASSISTED THORACOSCOPY (VATS)/DECORTICATION (Left) EMPYEMA DRAINAGE (Left)  Total Length of Stay:  LOS: 6 days  BP 128/62   Pulse 70   Temp 97.8 F (36.6 C) (Oral)   Resp 19   Ht 5' 5.5" (1.664 m)   Wt 175 lb 0.7 oz (79.4 kg)   SpO2 96%   BMI 28.69 kg/m   .Intake/Output      12/17 0701 - 12/18 0700   P.O.    I.V. (mL/kg) 110 (1.4)   IV Piggyback 50   Total Intake(mL/kg) 160 (2)   Urine (mL/kg/hr) 1190 (1.2)   Chest Tube 60 (0.1)   Total Output 1250   Net -1090         . dextrose 5 % and 0.9% NaCl 10 mL/hr at 01/13/16 0700     Lab Results  Component Value Date   WBC 19.4 (H) 01/13/2016   HGB 9.0 (L) 01/13/2016   HCT 27.7 (L) 01/13/2016   PLT 446 (H) 01/13/2016   GLUCOSE 100 (H) 01/13/2016   CHOL 145 01/18/2014   TRIG 70 01/18/2014   HDL 64 01/18/2014   LDLCALC 67 01/18/2014   ALT 60 01/13/2016   AST 39 01/13/2016   NA 133 (L) 01/13/2016   K 3.2 (L) 01/13/2016   CL 99 (L) 01/13/2016   CREATININE 1.56 (H) 01/13/2016   BUN 14 01/13/2016   CO2 21 (L) 01/13/2016   TSH 4.322 01/10/2016   INR 1.23 01/10/2016   HGBA1C 7.2 (H) 01/10/2016   Chronic Kidney Disease   Stage I     GFR >90  Stage II    GFR 60-89  Stage IIIA GFR 45-59  Stage IIIB GFR 30-44  Stage IV   GFR 15-29  Stage V    GFR  <15  Lab Results  Component Value Date   CREATININE 1.56 (H) 01/13/2016   Estimated Creatinine Clearance: 37.1 mL/min (by C-G formula based on SCr of 1.56 mg/dL (H)).  Stable day abdomed still midly distended, bm today and passing gas , continue to go slow advancing diet  Grace Isaac MD  Beeper [phone] Office [phone] 01/13/2016 7:15 PM

## 2016-01-13 NOTE — Plan of Care (Signed)
Problem: Activity: Goal: Risk for activity intolerance will decrease Outcome: Completed/Met Date Met: 01/13/16 Pt ambulating 1200 ft x 3 per day  Problem: Respiratory: Goal: Pain level will decrease with appropriate interventions Outcome: Progressing Pain managment with PCA Goal: Respiratory status will improve Outcome: Completed/Met Date Met: 01/13/16 SATS maintained on RA

## 2016-01-14 ENCOUNTER — Inpatient Hospital Stay (HOSPITAL_COMMUNITY): Payer: Medicare Other

## 2016-01-14 LAB — CBC
HCT: 27.3 % — ABNORMAL LOW (ref 39.0–52.0)
Hemoglobin: 9.1 g/dL — ABNORMAL LOW (ref 13.0–17.0)
MCH: 30.6 pg (ref 26.0–34.0)
MCHC: 33.3 g/dL (ref 30.0–36.0)
MCV: 91.9 fL (ref 78.0–100.0)
Platelets: 465 10*3/uL — ABNORMAL HIGH (ref 150–400)
RBC: 2.97 MIL/uL — ABNORMAL LOW (ref 4.22–5.81)
RDW: 14.5 % (ref 11.5–15.5)
WBC: 15.2 10*3/uL — ABNORMAL HIGH (ref 4.0–10.5)

## 2016-01-14 LAB — BASIC METABOLIC PANEL
Anion gap: 10 (ref 5–15)
BUN: 16 mg/dL (ref 6–20)
CO2: 24 mmol/L (ref 22–32)
Calcium: 8.5 mg/dL — ABNORMAL LOW (ref 8.9–10.3)
Chloride: 97 mmol/L — ABNORMAL LOW (ref 101–111)
Creatinine, Ser: 1.6 mg/dL — ABNORMAL HIGH (ref 0.61–1.24)
GFR calc Af Amer: 45 mL/min — ABNORMAL LOW (ref >60)
GFR calc non Af Amer: 39 mL/min — ABNORMAL LOW (ref >60)
Glucose, Bld: 99 mg/dL (ref 65–99)
Potassium: 3 mmol/L — ABNORMAL LOW (ref 3.5–5.1)
Sodium: 131 mmol/L — ABNORMAL LOW (ref 135–145)

## 2016-01-14 LAB — GLUCOSE, CAPILLARY
GLUCOSE-CAPILLARY: 98 mg/dL (ref 65–99)
GLUCOSE-CAPILLARY: 98 mg/dL (ref 65–99)
Glucose-Capillary: 161 mg/dL — ABNORMAL HIGH (ref 65–99)
Glucose-Capillary: 184 mg/dL — ABNORMAL HIGH (ref 65–99)

## 2016-01-14 LAB — TYPE AND SCREEN
Blood Product Expiration Date: 201712262359
Blood Product Expiration Date: 201712272359
Unit Type and Rh: 5100
Unit Type and Rh: 5100

## 2016-01-14 LAB — MAGNESIUM: Magnesium: 2.2 mg/dL (ref 1.7–2.4)

## 2016-01-14 MED ORDER — BISACODYL 5 MG PO TBEC: 10.0000 mg | DELAYED_RELEASE_TABLET | Freq: Every day | ORAL | Status: DC | PRN

## 2016-01-14 MED ORDER — POLYETHYLENE GLYCOL 3350 17 G PO PACK: 17.0000 g | PACK | Freq: Two times a day (BID) | ORAL | Status: DC

## 2016-01-14 MED ORDER — SODIUM CHLORIDE 0.9 % IV SOLN
INTRAVENOUS | Status: DC
  Administered 2016-01-14: 75 mL/h via INTRAVENOUS

## 2016-01-14 MED ORDER — SIMETHICONE 80 MG PO CHEW
80.0000 mg | CHEWABLE_TABLET | Freq: Four times a day (QID) | ORAL | Status: DC | PRN
  Administered 2016-01-14: 80 mg via ORAL
  Filled 2016-01-14: qty 1

## 2016-01-14 MED ORDER — POLYETHYLENE GLYCOL 3350 17 G PO PACK: 17.0000 g | PACK | Freq: Every day | ORAL | Status: DC | PRN

## 2016-01-14 MED ORDER — FUROSEMIDE 10 MG/ML IJ SOLN
40.0000 mg | Freq: Once | INTRAMUSCULAR | Status: AC
  Administered 2016-01-14: 40 mg via INTRAVENOUS
  Filled 2016-01-14: qty 4

## 2016-01-14 MED ORDER — POTASSIUM CHLORIDE CRYS ER 20 MEQ PO TBCR
40.0000 meq | EXTENDED_RELEASE_TABLET | Freq: Two times a day (BID) | ORAL | Status: DC
  Administered 2016-01-14 – 2016-01-15 (×4): 40 meq via ORAL
  Filled 2016-01-14 (×5): qty 2

## 2016-01-14 MED ORDER — SORBITOL 70 % SOLN
45.0000 mL | Freq: Once | Status: DC
  Filled 2016-01-14: qty 60

## 2016-01-14 NOTE — Care Management Important Message (Signed)
Important Message  Patient Details  Name: Dillon [name] MRN: [id number] Date of Birth: 11-29-1933   Medicare Important Message Given:  Yes    [name] 01/14/2016, 10:47 AM

## 2016-01-14 NOTE — Progress Notes (Signed)
PROGRESS NOTE    Dillon [name]  [id number] DOB: 07-04-1933 DOA: 01/07/2016 PCP: Daphene Calamity, MD     Brief Narrative:  Dillon [name] is a 82 y.o. male with medical history significant of CAD s/p CABG, HTN, HLD, hypothyroidism who started having productive cough of yellow sputum about 2 weeks ago.  Chest x-ray results came back and showed pneumonia as well as possible empyema. CT chest was obtained. CTS was consulted by EDP recommended thoracentesis. -s/p VATs with decortication 12/15  Assessment & Plan:   Sepsis secondary to empyema/loculated left pleural effusion -sepsis physiology resolved -was on  rocephin/azithromax for 5days changed to Vanc/Zosyn 12/15 -Blood cultures NGTD -s/p Thoracentesis 12/12: 50cc turbid fluid drained, extensive loculated collection,  -FU pleural fluid cultures-No growth x2days -s/p VATs with decortication 12/15, culture with strep -appreciate CVTS care, one chest tube removed today  Constipation -finally having BMs now, change laxatives to PRN   Acute kidney injury  -Baseline Cr 1.1 -In setting of sepsis -creatinine 1.6 now  CAD s/p CABG -Aspirin, holding plavix due to chest tube/VATS etc  HTN -Continue norvasc, metoprolol  -Held cozaar due to AKI   Hypothyroidism -Continue synthroid  HLD -Continue crestor, niacin   Possible pancreatic tail mass versus normal variant on CT -Recommend follow-up abdominal CT with contrast in near future (at time of follow up CT chest possibly)   DVT prophylaxis: subq heparin Code Status: Full Family Communication: wife at bedside Disposition Plan: Transfer to SDU or Tele per CVTS   Consultants:   CVTS  Procedures:   None  Antimicrobials:   Vanco/cefepime 12/11 >>    Subjective: Having BMs   Objective: Vitals:   01/14/16 0445 01/14/16 0500 01/14/16 0726 01/14/16 1126  BP:  119/62    Pulse:  68    Resp: 13 13    Temp:   98.7 F (37.1 C) 98.7 F (37.1 C)  TempSrc:    Oral Oral  SpO2: 94% 97%    Weight:      Height:        Intake/Output Summary (Last 24 hours) at 01/14/16 1348 Last data filed at 01/14/16 1100  Gross per 24 hour  Intake              520 ml  Output             1270 ml  Net             -750 ml   Filed Weights   01/10/16 2110 01/12/16 0600 01/13/16 0200  Weight: 78.4 kg (172 lb 13.5 oz) 79.5 kg (175 lb 4.3 oz) 79.4 kg (175 lb 0.7 oz)    Examination:  General exam: Appears calm and comfortable, AAOx3, no distress Respiratory system:  Diminished breath sounds on left, chest tube noted Cardiovascular system: S1 & S2 heard, RRR. No JVD, murmurs, rubs, gallops or clicks. No pedal edema. Gastrointestinal system: Abdomen is less distended, soft and nontender.  Normal bowel sounds heard. Central nervous system: Alert and oriented. No focal neurological deficits. Extremities: Symmetric 5 x 5 power. Skin: No rashes, lesions or ulcers Psychiatry: Judgement and insight appear normal. Mood & affect appropriate.   Data Reviewed: I have personally reviewed following labs and imaging studies  CBC:  Recent Labs Lab 01/09/16 0940 01/10/16 0354 01/10/16 0916 01/12/16 0440 01/13/16 0500 01/14/16 0440  WBC 22.9* 18.1* 23.8* 17.2* 19.4* 15.2*  NEUTROABS 20.4* 16.0*  --   --   --   --   HGB 9.5* 9.2* 10.3*  8.7* 9.0* 9.1*  HCT 28.8* 28.3* 31.2* 26.4* 27.7* 27.3*  MCV 94.4 95.0 95.1 94.3 93.9 91.9  PLT 500* 455* 531* 455* 446* [id number]*   Basic Metabolic Panel:  Recent Labs Lab 01/10/16 0354 01/10/16 0916 01/12/16 0440 01/13/16 0500 01/14/16 0430 01/14/16 0440  NA 133* 134* 132* 133*  --  131*  K 3.9 4.3 3.6 3.2*  --  3.0*  CL 102 103 102 99*  --  97*  CO2 22 23 23  21*  --  24  GLUCOSE 156* 266* 182* 100*  --  99  BUN 15 13 12 14   --  16  CREATININE 1.50* 1.52* 1.38* 1.56*  --  1.60*  CALCIUM 8.6* 9.2 8.8* 8.7*  --  8.5*  MG  --   --   --   --  2.2  --    GFR: Estimated Creatinine Clearance: 36.1 mL/min (by C-G formula based on  SCr of 1.6 mg/dL (H)). Liver Function Tests:  Recent Labs Lab 01/08/16 0500 01/10/16 0916 01/13/16 0500  AST 61* 107* 39  ALT 67* 101* 60  ALKPHOS 145* 188* 128*  BILITOT 0.2* 0.3 0.5  PROT 6.0* 6.7 5.8*  ALBUMIN 1.9* 2.0* 1.5*   No results for input(s): LIPASE, AMYLASE in the last 168 hours. No results for input(s): AMMONIA in the last 168 hours. Coagulation Profile:  Recent Labs Lab 01/10/16 0559  INR 1.23   Cardiac Enzymes: No results for input(s): CKTOTAL, CKMB, CKMBINDEX, TROPONINI in the last 168 hours. BNP (last 3 results) No results for input(s): PROBNP in the last 8760 hours. HbA1C: No results for input(s): HGBA1C in the last 72 hours. CBG:  Recent Labs Lab 01/13/16 1306 01/13/16 1816 01/13/16 2319 01/14/16 0534 01/14/16 1125  GLUCAP 187* 133* 162* 98 161*   Lipid Profile: No results for input(s): CHOL, HDL, LDLCALC, TRIG, CHOLHDL, LDLDIRECT in the last 72 hours. Thyroid Function Tests: No results for input(s): TSH, T4TOTAL, FREET4, T3FREE, THYROIDAB in the last 72 hours. Anemia Panel: No results for input(s): VITAMINB12, FOLATE, FERRITIN, TIBC, IRON, RETICCTPCT in the last 72 hours. Sepsis Labs: No results for input(s): PROCALCITON, LATICACIDVEN in the last 168 hours.  Recent Results (from the past 240 hour(s))  Blood culture (routine x 2)     Status: None   Collection Time: 01/07/16  2:20 PM  Result Value Ref Range Status   Specimen Description BLOOD LEFT FOREARM  Final   Special Requests BOTTLES DRAWN AEROBIC AND ANAEROBIC 5CC  Final   Culture NO GROWTH 5 DAYS  Final   Report Status 01/12/2016 FINAL  Final  Blood culture (routine x 2)     Status: None   Collection Time: 01/07/16  3:05 PM  Result Value Ref Range Status   Specimen Description BLOOD RIGHT FOREARM  Final   Special Requests BOTTLES DRAWN AEROBIC AND ANAEROBIC 5CC  Final   Culture NO GROWTH 5 DAYS  Final   Report Status 01/12/2016 FINAL  Final  Culture, body fluid-bottle      Status: None   Collection Time: 01/08/16 11:29 AM  Result Value Ref Range Status   Specimen Description PLEURAL LEFT  Final   Special Requests NONE  Final   Culture NO GROWTH 5 DAYS  Final   Report Status 01/13/2016 FINAL  Final  Gram stain     Status: None   Collection Time: 01/08/16 11:29 AM  Result Value Ref Range Status   Specimen Description PLEURAL LEFT  Final   Special Requests NONE  Final   Gram Stain   Final    FEW WBC PRESENT,BOTH PMN AND MONONUCLEAR NO ORGANISMS SEEN    Report Status 01/09/2016 FINAL  Final  Surgical pcr screen     Status: None   Collection Time: 01/10/16  6:02 AM  Result Value Ref Range Status   MRSA, PCR NEGATIVE NEGATIVE Final   Staphylococcus aureus NEGATIVE NEGATIVE Final    Comment:        The Xpert SA Assay (FDA approved for NASAL specimens in patients over 64 years of age), is one component of a comprehensive surveillance program.  Test performance has been validated by [facility] for patients greater than or equal to 74 year old. It is not intended to diagnose infection nor to guide or monitor treatment.   Acid Fast Smear (AFB)     Status: None   Collection Time: 01/11/16  8:34 AM  Result Value Ref Range Status   AFB Specimen Processing Concentration  Final   Acid Fast Smear Negative  Final    Comment: (NOTE) Performed At: [facility] [address] [place], [place] [id number] Lindon Romp MD [id number]    Source (AFB) PLEURAL  Final    Comment: LEFT  Culture, body fluid-bottle     Status: None (Preliminary result)   Collection Time: 01/11/16  8:34 AM  Result Value Ref Range Status   Specimen Description PLEURAL LEFT  Final   Special Requests NONE  Final   Culture NO GROWTH 2 DAYS  Final   Report Status PENDING  Incomplete  Gram stain     Status: None   Collection Time: 01/11/16  8:34 AM  Result Value Ref Range Status   Specimen Description PLEURAL LEFT  Final   Special Requests NONE  Final   Gram Stain    Final    ABUNDANT WBC PRESENT, PREDOMINANTLY PMN NO ORGANISMS SEEN    Report Status 01/12/2016 FINAL  Final  Aerobic/Anaerobic Culture (surgical/deep wound)     Status: None (Preliminary result)   Collection Time: 01/11/16  8:42 AM  Result Value Ref Range Status   Specimen Description PLEURAL LEFT  Final   Special Requests SPEC C ON SWABS  Final   Gram Stain   Final    ABUNDANT WBC PRESENT, PREDOMINANTLY PMN MODERATE GRAM VARIABLE COCCI    Culture   Final    FEW MICROAEROPHILIC STREPTOCOCCI Standardized susceptibility testing for this organism is not available. NO ANAEROBES ISOLATED; CULTURE IN PROGRESS FOR 5 DAYS    Report Status PENDING  Incomplete  Aerobic Culture (superficial specimen)     Status: None   Collection Time: 01/11/16  8:55 AM  Result Value Ref Range Status   Specimen Description TISSUE LEFT PLEURAL  Final   Special Requests   Final    LEFT PLEURAL PEEL PATIENT ON FOLLOWING  ZINACEF AZITHROMYCIN AND CEFTIN   Gram Stain   Final    ABUNDANT WBC PRESENT,BOTH PMN AND MONONUCLEAR NO ORGANISMS SEEN    Culture NO GROWTH 2 DAYS  Final   Report Status 01/13/2016 FINAL  Final       Radiology Studies: Dg Chest Port 1 View  Result Date: 01/14/2016 CLINICAL DATA:  Chest tube in place following empyema drainage EXAM: PORTABLE CHEST 1 VIEW COMPARISON:  01/13/2016 FINDINGS: Cardiac shadow is enlarged but stable. Postsurgical changes are again seen. A left chest tube is again noted as well as a left jugular central line. Small left pneumothorax is again seen laterally but stable. The  right lung remains clear. Stable left basilar changes are noted. IMPRESSION: Stable left basilar changes. No significant change in left pneumothorax. Electronically Signed   By: Inez Catalina M.D.   On: 01/14/2016 07:44   Dg Chest Port 1 View  Result Date: 01/13/2016 CLINICAL DATA:  Status post left VATS and chest tube placement. EXAM: PORTABLE CHEST 1 VIEW COMPARISON:  Yesterday. FINDINGS:  Two left chest tubes remain in place. An approximately 5% left lateral and apical pneumothorax is demonstrated. No significant change in patchy opacity at the left lung base. Clear right lung. Mildly enlarged cardiac silhouette. Post CABG changes. Left jugular catheter tip in the superior vena cava. Left apical surgical clips and staples. Unremarkable bones. IMPRESSION: 1. Interval approximately 5% left pneumothorax. 2. Stable left basilar atelectasis, aspiration pneumonitis or pneumonia. Electronically Signed   By: Claudie Revering M.D.   On: 01/13/2016 07:17      Scheduled Meds: . acetaminophen  1,000 mg Oral Q6H   Or  . acetaminophen (TYLENOL) oral liquid 160 mg/5 mL  1,000 mg Oral Q6H  . amLODipine  5 mg Oral QHS  . aspirin EC  81 mg Oral QHS  . bisacodyl  10 mg Oral Daily  . enoxaparin (LOVENOX) injection  30 mg Subcutaneous Q24H  . fentaNYL   Intravenous Q4H  . guaiFENesin  600 mg Oral BID  . insulin aspart  0-24 Units Subcutaneous Q6H  . levothyroxine  112 mcg Oral QAC breakfast  . mouth rinse  15 mL Mouth Rinse BID  . metoprolol succinate  25 mg Oral Daily  . niacin  1,000 mg Oral QHS  . pantoprazole  40 mg Oral Daily  . piperacillin-tazobactam (ZOSYN)  IV  3.375 g Intravenous Q8H  . polyethylene glycol  17 g Oral BID  . potassium chloride  10 mEq Oral Daily  . potassium chloride  40 mEq Oral BID  . rosuvastatin  40 mg Oral QHS  . senna-docusate  2 tablet Oral BID  . sodium chloride flush  10-40 mL Intracatheter Q12H  . sorbitol  45 mL Oral Once  . vancomycin  1,000 mg Intravenous QHS   Continuous Infusions: . sodium chloride 75 mL/hr (01/14/16 1100)     LOS: 7 days    Time spent: 30 minutes   Domenic Polite, MD [facility] [email or web address] Password [email or web address] 01/14/2016, 1:48 PM

## 2016-01-14 NOTE — Progress Notes (Signed)
3 Days Post-Op Procedure(s) (LRB): VIDEO ASSISTED THORACOSCOPY (VATS)/DECORTICATION (Left) EMPYEMA DRAINAGE (Left) Subjective: Status post left VATS for empyema Cytology of fluid remains negative for malignancy Culture of fluid and pleural peel remains no growth Continue IV vancomycin and Zosyn Chest tube drainage decreasing so removal 1 tube Diuresing well, positive for bowel movement Ready for transfer to step down unit when bed available Objective: Vital signs in last 24 hours: Temp:  [98 F (36.7 C)-99 F (37.2 C)] 98.7 F (37.1 C) (12/18 1126) Pulse Rate:  [40-98] 68 (12/18 0500) Cardiac Rhythm: Normal sinus rhythm (12/18 0000) Resp:  [13-20] 18 (12/18 1200) BP: (91-128)/(48-72) 119/62 (12/18 0500) SpO2:  [94 %-98 %] 96 % (12/18 1200)  Hemodynamic parameters for last 24 hours:   Stable Intake/Output from previous day: 12/17 0701 - 12/18 0700 In: 580 [I.V.:230; IV Piggyback:350] Out: [id number] [Urine:1690; Chest Tube:60] Intake/Output this shift: Total I/O In: -  Out: 485 [Urine:425; Chest Tube:60]  Alert and comfortable Breath sounds clear Abdomen soft Mild edema Neuro intact  Lab Results:  Recent Labs  01/13/16 0500 01/14/16 0440  WBC 19.4* 15.2*  HGB 9.0* 9.1*  HCT 27.7* 27.3*  PLT 446* 465*   BMET:  Recent Labs  01/13/16 0500 01/14/16 0440  NA 133* 131*  K 3.2* 3.0*  CL 99* 97*  CO2 21* 24  GLUCOSE 100* 99  BUN 14 16  CREATININE 1.56* 1.60*  CALCIUM 8.7* 8.5*    PT/INR: No results for input(s): LABPROT, INR in the last 72 hours. ABG    Component Value Date/Time   PHART 7.374 01/12/2016 0406   HCO3 22.9 01/12/2016 0406   TCO2 24 01/12/2016 0406   ACIDBASEDEF 2.0 01/12/2016 0406   O2SAT 91.0 01/12/2016 0406   CBG (last 3)   Recent Labs  01/13/16 2319 01/14/16 0534 01/14/16 1125  GLUCAP 162* 98 161*    Assessment/Plan: S/P Procedure(s) (LRB): VIDEO ASSISTED THORACOSCOPY (VATS)/DECORTICATION (Left) EMPYEMA DRAINAGE (Left) Resume  Plavix when last chest tube is removed Transition to oral Augmentin when discharged to home Continue PCA until last chest tube is removed    Dillon [name] 01/14/2016

## 2016-01-14 NOTE — Progress Notes (Signed)
CT surgery p.m. Rounds  Patient had stable day Bowel movement occurred Minimal chest tube drainage Diuresing adequately

## 2016-01-14 NOTE — Progress Notes (Signed)
Pharmacy Antibiotic Note  Dillon [name] is a 82 y.o. male s/p thoracentesis 12/12, s/p VATS and drainage of empyema 12/15. WBC up to 23. Antibiotics broadened to zosyn/zosyn on 12/15. WBC up to 23. Antibiotics broadened to zosyn earlier today and now adding Vancomycin.   Plan: 1) Continue Zosyn 3.375g IV q8 (4 hour infusion) 2) Vancomycin 1gm IV q24h 3) Follow renal function, cultures, LOT 4) check vancomycin trough Tuesday or Wednesday if contuinues   Height: 5' 5.5" (166.4 cm) Weight: 175 lb 0.7 oz (79.4 kg) IBW/kg (Calculated) : 62.65  Temp (24hrs), Avg:98.6 F (37 C), Min:98 F (36.7 C), Max:99 F (37.2 C)   Recent Labs Lab 01/10/16 0354 01/10/16 0916 01/12/16 0440 01/13/16 0500 01/14/16 0440  WBC 18.1* 23.8* 17.2* 19.4* 15.2*  CREATININE 1.50* 1.52* 1.38* 1.56* 1.60*    Estimated Creatinine Clearance: 36.1 mL/min (by C-G formula based on SCr of 1.6 mg/dL (H)).    Allergies  Allergen Reactions  . No Known Allergies     Antimicrobials this admission: CTX 12/11 >> 12/15 Azith 12/11 >> 12/15 Zosyn 12/15 >> Vanc 12/15 >>  Dose adjustments this admission: n/a  Microbiology results: 12/11 BCx: neg 12/12 left pleural fluid: neg 12/14 MRSA PCR negative 12/15 left pleural fluid: ngtd 12/15 left pleural tissue: FEW MICROAEROPHILIC STREPTOCOCCI  Thank you for allowing pharmacy to be a part of this patient's care.  Maryanna Shape, PharmD, BCPS  Clinical Pharmacist  Pager: [phone]   01/14/2016 2:22 PM

## 2016-01-15 ENCOUNTER — Inpatient Hospital Stay (HOSPITAL_COMMUNITY): Payer: Medicare Other

## 2016-01-15 LAB — CBC
HCT: 26.3 % — ABNORMAL LOW (ref 39.0–52.0)
Hemoglobin: 8.7 g/dL — ABNORMAL LOW (ref 13.0–17.0)
MCH: 30.5 pg (ref 26.0–34.0)
MCHC: 33.1 g/dL (ref 30.0–36.0)
MCV: 92.3 fL (ref 78.0–100.0)
Platelets: 450 10*3/uL — ABNORMAL HIGH (ref 150–400)
RBC: 2.85 MIL/uL — ABNORMAL LOW (ref 4.22–5.81)
RDW: 14.4 % (ref 11.5–15.5)
WBC: 12 10*3/uL — ABNORMAL HIGH (ref 4.0–10.5)

## 2016-01-15 LAB — COMPREHENSIVE METABOLIC PANEL
ALK PHOS: 116 U/L (ref 38–126)
ALT: 56 U/L (ref 17–63)
AST: 53 U/L — AB (ref 15–41)
Albumin: 1.7 g/dL — ABNORMAL LOW (ref 3.5–5.0)
Anion gap: 8 (ref 5–15)
BILIRUBIN TOTAL: 0.7 mg/dL (ref 0.3–1.2)
BUN: 14 mg/dL (ref 6–20)
CALCIUM: 8.3 mg/dL — AB (ref 8.9–10.3)
CO2: 26 mmol/L (ref 22–32)
Chloride: 99 mmol/L — ABNORMAL LOW (ref 101–111)
Creatinine, Ser: 1.48 mg/dL — ABNORMAL HIGH (ref 0.61–1.24)
GFR calc Af Amer: 50 mL/min — ABNORMAL LOW (ref >60)
GFR, EST NON AFRICAN AMERICAN: 43 mL/min — AB (ref >60)
GLUCOSE: 127 mg/dL — AB (ref 65–99)
Potassium: 3.5 mmol/L (ref 3.5–5.1)
Sodium: 133 mmol/L — ABNORMAL LOW (ref 135–145)
TOTAL PROTEIN: 5.6 g/dL — AB (ref 6.5–8.1)

## 2016-01-15 LAB — GLUCOSE, CAPILLARY
GLUCOSE-CAPILLARY: 143 mg/dL — AB (ref 65–99)
GLUCOSE-CAPILLARY: 148 mg/dL — AB (ref 65–99)
Glucose-Capillary: 140 mg/dL — ABNORMAL HIGH (ref 65–99)
Glucose-Capillary: 187 mg/dL — ABNORMAL HIGH (ref 65–99)

## 2016-01-15 MED ORDER — AMOXICILLIN-POT CLAVULANATE 875-125 MG PO TABS: 1.0000 | ORAL_TABLET | Freq: Two times a day (BID) | ORAL | 0 refills | Status: DC

## 2016-01-15 MED ORDER — INSULIN ASPART 100 UNIT/ML ~~LOC~~ SOLN
0.0000 [IU] | Freq: Three times a day (TID) | SUBCUTANEOUS | Status: DC
  Administered 2016-01-15: 1 [IU] via SUBCUTANEOUS
  Administered 2016-01-16: 2 [IU] via SUBCUTANEOUS

## 2016-01-15 MED ORDER — CLOPIDOGREL BISULFATE 75 MG PO TABS
75.0000 mg | ORAL_TABLET | Freq: Every day | ORAL | Status: DC
  Administered 2016-01-16: 75 mg via ORAL
  Filled 2016-01-15: qty 1

## 2016-01-15 MED ORDER — SODIUM CHLORIDE 0.9 % IV SOLN
30.0000 meq | Freq: Once | INTRAVENOUS | Status: AC
  Administered 2016-01-15: 30 meq via INTRAVENOUS
  Filled 2016-01-15: qty 15

## 2016-01-15 NOTE — Progress Notes (Signed)
Fentanyl 63ml flushed down sink and witnessed by Warden Fillers, RN

## 2016-01-15 NOTE — Progress Notes (Addendum)
PROGRESS NOTE    Dillon [name]  [id number] DOB: 06/19/1933 DOA: 01/07/2016 PCP: Daphene Calamity, MD     Brief Narrative:  Dillon [name] is a 82 y.o. male with medical history significant of CAD s/p CABG, HTN, HLD, hypothyroidism who started having productive cough of yellow sputum about 2 weeks ago.  Chest x-ray results came back and showed pneumonia as well as possible empyema. CT chest was obtained. CTS was consulted by EDP recommended thoracentesis. -s/p VATs with decortication 12/15  Assessment & Plan:   Sepsis secondary to empyema/loculated left pleural effusion -sepsis physiology resolved -was on  rocephin/azithromax for 5days changed to Vanc/Zosyn 12/15 -Blood cultures NGTD -s/p Thoracentesis 12/12: 50cc turbid fluid drained, extensive loculated collection,  -FU pleural fluid cultures-No growth x2days -s/p VATs with decortication 12/15, culture with strep -appreciate CVTS care, one chest tube removed 12/18, the other removed too -Stop Vanc and change Zosyn to oral augmentin at discharge  Constipation -finally having BMs now, change laxatives to PRN   Acute kidney injury  -Baseline Cr 1.1 -In setting of sepsis -creatinine 1.4 now, monitor  CAD s/p CABG -Aspirin,  plavix was on hold due to chest tube/VATS etc -resume plavix tomorrow  HTN -Continue norvasc, metoprolol  -Held cozaar due to AKI   Hypothyroidism -Continue synthroid  HLD -Continue crestor, niacin   Possible pancreatic tail mass versus normal variant on CT -Recommend follow-up abdominal CT with contrast in near future (at time of follow up CT chest possibly)   DVT prophylaxis: subq heparin Code Status: Full Family Communication: wife at bedside Disposition Plan: Transfer to floor from ICU today   Consultants:   CVTS  Procedures:   None  Antimicrobials:   Vanco/cefepime 12/11 >>    Subjective: Having BMs, breathing better, other chest tube just came  out   Objective: Vitals:   01/15/16 0800 01/15/16 0900 01/15/16 1100 01/15/16 1144  BP: (!) 136/57 (!) 146/54  (!) 134/47  Pulse: 80 68  (!) 52  Resp: (!) 22     Temp: 98 F (36.7 C)  (P) 97.9 F (36.6 C) 97.7 F (36.5 C)  TempSrc: Oral   Oral  SpO2: 95% 96%  95%  Weight:      Height:        Intake/Output Summary (Last 24 hours) at 01/15/16 1203 Last data filed at 01/15/16 0800  Gross per 24 hour  Intake             2220 ml  Output              100 ml  Net             2120 ml   Filed Weights   01/10/16 2110 01/12/16 0600 01/13/16 0200  Weight: 78.4 kg (172 lb 13.5 oz) 79.5 kg (175 lb 4.3 oz) 79.4 kg (175 lb 0.7 oz)    Examination:  General exam: Appears calm and comfortable, AAOx3, no distress Respiratory system:  Diminished breath sounds on left, chest tube removed, incision noted Cardiovascular system: S1 & S2 heard, RRR. No JVD, murmurs, rubs, gallops or clicks. No pedal edema. Gastrointestinal system: Abdomen is less distended, soft and nontender.  Normal bowel sounds heard. Central nervous system: Alert and oriented. No focal neurological deficits. Extremities: Symmetric 5 x 5 power. Skin: No rashes, lesions or ulcers Psychiatry: Judgement and insight appear normal. Mood & affect appropriate.   Data Reviewed: I have personally reviewed following labs and imaging studies  CBC:  Recent Labs Lab 01/09/16 0940  01/10/16 0354 01/10/16 0916 01/12/16 0440 01/13/16 0500 01/14/16 0440 01/15/16 0407  WBC 22.9* 18.1* 23.8* 17.2* 19.4* 15.2* 12.0*  NEUTROABS 20.4* 16.0*  --   --   --   --   --   HGB 9.5* 9.2* 10.3* 8.7* 9.0* 9.1* 8.7*  HCT 28.8* 28.3* 31.2* 26.4* 27.7* 27.3* 26.3*  MCV 94.4 95.0 95.1 94.3 93.9 91.9 92.3  PLT 500* 455* 531* 455* 446* 465* [id number]*   Basic Metabolic Panel:  Recent Labs Lab 01/10/16 0916 01/12/16 0440 01/13/16 0500 01/14/16 0430 01/14/16 0440 01/15/16 0407  NA 134* 132* 133*  --  131* 133*  K 4.3 3.6 3.2*  --  3.0* 3.5  CL  103 102 99*  --  97* 99*  CO2 23 23 21*  --  24 26  GLUCOSE 266* 182* 100*  --  99 127*  BUN 13 12 14   --  16 14  CREATININE 1.52* 1.38* 1.56*  --  1.60* 1.48*  CALCIUM 9.2 8.8* 8.7*  --  8.5* 8.3*  MG  --   --   --  2.2  --   --    GFR: Estimated Creatinine Clearance: 39.1 mL/min (by C-G formula based on SCr of 1.48 mg/dL (H)). Liver Function Tests:  Recent Labs Lab 01/10/16 0916 01/13/16 0500 01/15/16 0407  AST 107* 39 53*  ALT 101* 60 56  ALKPHOS 188* 128* 116  BILITOT 0.3 0.5 0.7  PROT 6.7 5.8* 5.6*  ALBUMIN 2.0* 1.5* 1.7*   No results for input(s): LIPASE, AMYLASE in the last 168 hours. No results for input(s): AMMONIA in the last 168 hours. Coagulation Profile:  Recent Labs Lab 01/10/16 0559  INR 1.23   Cardiac Enzymes: No results for input(s): CKTOTAL, CKMB, CKMBINDEX, TROPONINI in the last 168 hours. BNP (last 3 results) No results for input(s): PROBNP in the last 8760 hours. HbA1C: No results for input(s): HGBA1C in the last 72 hours. CBG:  Recent Labs Lab 01/14/16 1125 01/14/16 1900 01/14/16 2348 01/15/16 0453 01/15/16 1129  GLUCAP 161* 184* 98 143* 148*   Lipid Profile: No results for input(s): CHOL, HDL, LDLCALC, TRIG, CHOLHDL, LDLDIRECT in the last 72 hours. Thyroid Function Tests: No results for input(s): TSH, T4TOTAL, FREET4, T3FREE, THYROIDAB in the last 72 hours. Anemia Panel: No results for input(s): VITAMINB12, FOLATE, FERRITIN, TIBC, IRON, RETICCTPCT in the last 72 hours. Sepsis Labs: No results for input(s): PROCALCITON, LATICACIDVEN in the last 168 hours.  Recent Results (from the past 240 hour(s))  Blood culture (routine x 2)     Status: None   Collection Time: 01/07/16  2:20 PM  Result Value Ref Range Status   Specimen Description BLOOD LEFT FOREARM  Final   Special Requests BOTTLES DRAWN AEROBIC AND ANAEROBIC 5CC  Final   Culture NO GROWTH 5 DAYS  Final   Report Status 01/12/2016 FINAL  Final  Blood culture (routine x 2)      Status: None   Collection Time: 01/07/16  3:05 PM  Result Value Ref Range Status   Specimen Description BLOOD RIGHT FOREARM  Final   Special Requests BOTTLES DRAWN AEROBIC AND ANAEROBIC 5CC  Final   Culture NO GROWTH 5 DAYS  Final   Report Status 01/12/2016 FINAL  Final  Culture, body fluid-bottle     Status: None   Collection Time: 01/08/16 11:29 AM  Result Value Ref Range Status   Specimen Description PLEURAL LEFT  Final   Special Requests NONE  Final   Culture NO  GROWTH 5 DAYS  Final   Report Status 01/13/2016 FINAL  Final  Gram stain     Status: None   Collection Time: 01/08/16 11:29 AM  Result Value Ref Range Status   Specimen Description PLEURAL LEFT  Final   Special Requests NONE  Final   Gram Stain   Final    FEW WBC PRESENT,BOTH PMN AND MONONUCLEAR NO ORGANISMS SEEN    Report Status 01/09/2016 FINAL  Final  Surgical pcr screen     Status: None   Collection Time: 01/10/16  6:02 AM  Result Value Ref Range Status   MRSA, PCR NEGATIVE NEGATIVE Final   Staphylococcus aureus NEGATIVE NEGATIVE Final    Comment:        The Xpert SA Assay (FDA approved for NASAL specimens in patients over 62 years of age), is one component of a comprehensive surveillance program.  Test performance has been validated by [facility] for patients greater than or equal to 44 year old. It is not intended to diagnose infection nor to guide or monitor treatment.   Fungus Culture With Stain     Status: None (Preliminary result)   Collection Time: 01/11/16  8:34 AM  Result Value Ref Range Status   Fungus Stain Final report  Final    Comment: (NOTE) Performed At: [facility] [place], [place] [id number] Lindon Romp MD [id number]    Fungus (Mycology) Culture PENDING  Incomplete   Fungal Source PLEURAL  Final    Comment: LEFT  Acid Fast Smear (AFB)     Status: None   Collection Time: 01/11/16  8:34 AM  Result Value Ref Range Status   AFB Specimen Processing  Concentration  Final   Acid Fast Smear Negative  Final    Comment: (NOTE) Performed At: [facility] [address] [place], [place] [id number] Lindon Romp MD [id number]    Source (AFB) PLEURAL  Final    Comment: LEFT  Culture, body fluid-bottle     Status: None (Preliminary result)   Collection Time: 01/11/16  8:34 AM  Result Value Ref Range Status   Specimen Description PLEURAL LEFT  Final   Special Requests NONE  Final   Culture NO GROWTH 3 DAYS  Final   Report Status PENDING  Incomplete  Gram stain     Status: None   Collection Time: 01/11/16  8:34 AM  Result Value Ref Range Status   Specimen Description PLEURAL LEFT  Final   Special Requests NONE  Final   Gram Stain   Final    ABUNDANT WBC PRESENT, PREDOMINANTLY PMN NO ORGANISMS SEEN    Report Status 01/12/2016 FINAL  Final  Fungus Culture Result     Status: None   Collection Time: 01/11/16  8:34 AM  Result Value Ref Range Status   Result 1 Comment  Final    Comment: (NOTE) KOH/Calcofluor preparation:  no fungus observed. Performed At: [facility] [place], [place] [id number] Lindon Romp MD [id number]   Aerobic/Anaerobic Culture (surgical/deep wound)     Status: None (Preliminary result)   Collection Time: 01/11/16  8:42 AM  Result Value Ref Range Status   Specimen Description PLEURAL LEFT  Final   Special Requests SPEC C ON SWABS  Final   Gram Stain   Final    ABUNDANT WBC PRESENT, PREDOMINANTLY PMN MODERATE GRAM VARIABLE COCCI    Culture   Final    FEW MICROAEROPHILIC STREPTOCOCCI Standardized susceptibility testing for this  organism is not available. NO ANAEROBES ISOLATED; CULTURE IN PROGRESS FOR 5 DAYS    Report Status PENDING  Incomplete  Aerobic Culture (superficial specimen)     Status: None   Collection Time: 01/11/16  8:55 AM  Result Value Ref Range Status   Specimen Description TISSUE LEFT PLEURAL  Final   Special Requests   Final    LEFT PLEURAL  PEEL PATIENT ON FOLLOWING  ZINACEF AZITHROMYCIN AND CEFTIN   Gram Stain   Final    ABUNDANT WBC PRESENT,BOTH PMN AND MONONUCLEAR NO ORGANISMS SEEN    Culture NO GROWTH 2 DAYS  Final   Report Status 01/13/2016 FINAL  Final       Radiology Studies: Dg Chest 2 View  Result Date: 01/15/2016 CLINICAL DATA:  82 year old male with shortness of breath. EXAM: CHEST  2 VIEW COMPARISON:  Chest radiograph dated 01/14/2016 FINDINGS: Left-sided chest tube and left IJ central line in stable positioning. There has been interval improvement with near complete resolution of the left-sided pneumothorax. Minimal residual pneumothorax may be present along the left lateral pleural surface. There is consolidative changes of the left lower lobe. The right lung is clear. The cardiac silhouette is within normal limits. Median sternotomy wires and CABG vascular clips noted. Nondisplaced fracture of the lateral aspect of the left seventh rib. IMPRESSION: Left-sided chest tube and left IJ central line in stable position. Interval improvement with near complete resolution of the left-sided pneumothorax. Minimal residual pneumothorax may be present not along the left lateral pleural surface. Left lung base consolidative changes similar to prior radiograph. Fracture of the lateral aspect of the left seventh rib. Electronically Signed   By: Anner Crete M.D.   On: 01/15/2016 07:05   Dg Chest Port 1 View  Result Date: 01/14/2016 CLINICAL DATA:  Chest tube in place following empyema drainage EXAM: PORTABLE CHEST 1 VIEW COMPARISON:  01/13/2016 FINDINGS: Cardiac shadow is enlarged but stable. Postsurgical changes are again seen. A left chest tube is again noted as well as a left jugular central line. Small left pneumothorax is again seen laterally but stable. The right lung remains clear. Stable left basilar changes are noted. IMPRESSION: Stable left basilar changes. No significant change in left pneumothorax. Electronically  Signed   By: Inez Catalina M.D.   On: 01/14/2016 07:44      Scheduled Meds: . acetaminophen  1,000 mg Oral Q6H   Or  . acetaminophen (TYLENOL) oral liquid 160 mg/5 mL  1,000 mg Oral Q6H  . amLODipine  5 mg Oral QHS  . aspirin EC  81 mg Oral QHS  . enoxaparin (LOVENOX) injection  30 mg Subcutaneous Q24H  . guaiFENesin  600 mg Oral BID  . insulin aspart  0-24 Units Subcutaneous Q6H  . levothyroxine  112 mcg Oral QAC breakfast  . mouth rinse  15 mL Mouth Rinse BID  . metoprolol succinate  25 mg Oral Daily  . niacin  1,000 mg Oral QHS  . pantoprazole  40 mg Oral Daily  . piperacillin-tazobactam (ZOSYN)  IV  3.375 g Intravenous Q8H  . potassium chloride  40 mEq Oral BID  . rosuvastatin  40 mg Oral QHS  . senna-docusate  2 tablet Oral BID  . sodium chloride flush  10-40 mL Intracatheter Q12H  . sorbitol  45 mL Oral Once   Continuous Infusions:    LOS: 8 days    Time spent: 30 minutes   Domenic Polite, MD [facility] [email or web address] Password [email or web address] 01/15/2016, 12:03 PM

## 2016-01-15 NOTE — Evaluation (Signed)
Physical Therapy Evaluation/Discharge  Patient Details Name: Dillon [name] MRN: [id number] DOB: [birth year] Today's Date: 01/15/2016   History of Present Illness  Patient is a 82 year old male admitted 01/07/16 for LLL PNA and empyema. PMH includes CAD, s/p CABG x 3 in 2009, HTN, and HLD.   Clinical Impression  Patient is at baseline functional status, as he is either modified independent or independent in mobility. No shortness of breath noted during treatment. Patient will have necessary assist at discharge from wife if needed, and patient is aware and agreeable to no further PT needs.   HR during session: 72-80 SPO2 during session room air: 90-95%     Follow Up Recommendations No PT follow up    Equipment Recommendations  None recommended by PT    Recommendations for Other Services       Precautions / Restrictions Restrictions Weight Bearing Restrictions: No      Mobility  Bed Mobility                  Transfers Overall transfer level: Independent               General transfer comment: patient sitting in recliner upon arrival. Patient performed 1 sit to stand independently.   Ambulation/Gait Ambulation/Gait assistance: Modified Independent Ambulation Distance (Feet): 400 Feet Assistive device: None Gait Pattern/deviations: Step-through pattern;WFL(Within Functional Limits)   Gait velocity interpretation: at or above normal speed for age/gender General Gait Details: patient able to steadily ambulate without assistance. patient had slight slowing and unsteadiness with horizontal and vertical head turns during ambulation. All self-corrected by the patient.   Stairs Stairs: Yes Stairs assistance: Modified independent (Device/Increase time) Stair Management: One rail Right;Alternating pattern Number of Stairs: 4    Wheelchair Mobility    Modified Rankin (Stroke Patients Only)       Balance Overall balance assessment: Independent (patient able to  sit and stand independently )                                           Pertinent Vitals/Pain Pain Assessment: No/denies pain    Home Living Family/patient expects to be discharged to:: Private residence Living Arrangements: Spouse/significant other Available Help at Discharge: Family;Available 24 hours/day Type of Home: House Home Access: Stairs to enter Entrance Stairs-Rails: None Entrance Stairs-Number of Steps: 1 step into garage, 1 step into bedroom, 1 step into great room  Home Layout: One level Home Equipment: Shower seat - built in      Prior Function Level of Independence: Independent         Comments: wife cooks, but patient able to perform all other ADLs and self care at home. No history of falls.      Hand Dominance        Extremity/Trunk Assessment   Upper Extremity Assessment Upper Extremity Assessment: Overall WFL for tasks assessed    Lower Extremity Assessment Lower Extremity Assessment: Overall WFL for tasks assessed       Communication   Communication: No difficulties  Cognition Arousal/Alertness: Awake/alert Behavior During Therapy: WFL for tasks assessed/performed Overall Cognitive Status: Within Functional Limits for tasks assessed                      General Comments      Exercises     Assessment/Plan    PT Assessment Patent does not need  any further PT services  PT Problem List            PT Treatment Interventions      PT Goals (Current goals can be found in the Care Plan section)  Acute Rehab PT Goals Patient Stated Goal: return home  PT Goal Formulation: All assessment and education complete, DC therapy    Frequency     Barriers to discharge        Co-evaluation               End of Session Equipment Utilized During Treatment: Gait belt Activity Tolerance: Patient tolerated treatment well;No increased pain Patient left: in chair;with call bell/phone within reach Nurse  Communication: Mobility status         Time: 1355-1415 PT Time Calculation (min) (ACUTE ONLY): 20 min   Charges:   PT Evaluation $PT Eval Low Complexity: 1 Procedure     PT G Codes:        Qusay Villada 02/08/2016, 2:48 PM  Loc Feinstein SPT [id number]

## 2016-01-15 NOTE — Progress Notes (Signed)
Pt has arrived to 2w from 2s. Telemetry box applied and CCMD notified. Vitals stable. Lunch tray ordered. Pt oriented to room. Will continue current plan of care.  Grant Fontana BSN, RN

## 2016-01-15 NOTE — Discharge Summary (Signed)
Physician Discharge Summary  Dillon [name] [id number] DOB: 05-Dec-1933 DOA: 01/07/2016  PCP: Daphene Calamity, MD  Admit date: 01/07/2016 Discharge date: 01/15/2016  Time spent: 45 minutes  Recommendations for Outpatient Follow-up:  1. Dr.Van Trigt in 1-2weeks 2.   Recommend follow-up abdominal CT with contrast in 1-65months due to ? pancreatic tail mass vs normal variant   Discharge Diagnoses:  Principal Problem:   Empyema (Dillon [name]) Active Problems:   CAD (coronary artery disease)   Hyperlipidemia, mixed   Hypothyroid   HTN (hypertension)   Pneumonia   AKI (acute kidney injury) ([place])   Sepsis ([place])   Discharge Condition: stable  Diet recommendation: heart healthy  Filed Weights   01/10/16 2110 01/12/16 0600 01/13/16 0200  Weight: 78.4 kg (172 lb 13.5 oz) 79.5 kg (175 lb 4.3 oz) 79.4 kg (175 lb 0.7 oz)    History of present illness:  Dillon [name] a 82 y.o.malewith medical history significant of CAD s/p CABG, HTN, HLD, hypothyroidism who started having productive cough of yellow sputum about 2 weeks ago.  Chest x-ray results came back and showed pneumonia as well as possible empyema  Hospital Course:  Empyema/loculated left pleural effusion with sepsis -improved, sepsis physiology resolved -was on  rocephin/azithromax for 5days changed to Vanc/Zosyn 12/15 -Blood cultures NGTD -s/p Thoracentesis 12/12: 50cc turbid fluid drained, extensive loculated collection,  -CVTS consulted, s/p VATs with decortication 12/15, culture with streptococci -appreciate CVTS care, chest tubes removed 12/18, and 12/19  -Stopped Vanc and transitioned oral augmentin at discharge for 6 more days to complete 2week abx therapy  Constipation -resolved  Acute kidney injury  -Baseline Cr 1.1 -In setting of sepsis -creatinine 1.4 now, stable  CAD s/p CABG -Aspirin,  plavix was on hold due to chest tube/VATS etc -resumed plavix today  HTN -Continue norvasc, metoprolol  -Held  cozaar due to AKI   Hypothyroidism -Continue synthroid  HLD -Continue crestor, niacin   Possible pancreatic tail mass versus normal variant on CT -Recommend follow-up abdominal CT with contrast in near future (at time of follow up CT chest possibly   Procedures: VIDEO ASSISTED THORACOSCOPY (VATS)/DECORTICATION (Left) EMPYEMA DRAINAGE (Left) 12/16   Consultations:  CVTS Dr.Van Trigt  Discharge Exam: Vitals:   01/15/16 1427 01/15/16 1927  BP:  (!) 127/53  Pulse: 72 80  Resp:  16  Temp:  98.4 F (36.9 C)    General: AAOx3 Cardiovascular: S1S2/RRR Respiratory: decreased BS at L base  Discharge Instructions    Current Discharge Medication List    START taking these medications   Details  amoxicillin-clavulanate (AUGMENTIN) 875-125 MG tablet Take 1 tablet by mouth 2 (two) times daily. For 6days Qty: 12 tablet, Refills: 0      CONTINUE these medications which have NOT CHANGED   Details  amLODipine (NORVASC) 5 MG tablet Take 1 tablet (5 mg total) by mouth daily. Qty: 90 tablet, Refills: 3    aspirin EC 81 MG tablet Take 81 mg by mouth at bedtime.     Cholecalciferol (VITAMIN D) 2000 units tablet Take 2,000 Units by mouth at bedtime.    clopidogrel (PLAVIX) 75 MG tablet Take 1 tablet (75 mg total) by mouth daily. Qty: 90 tablet, Refills: 3    fexofenadine (ALLEGRA) 180 MG tablet Take 180 mg by mouth daily.    folic acid (FOLVITE) 1 MG tablet TAKE 1 TABLET EVERY DAY Qty: 90 tablet, Refills: 1    levothyroxine (SYNTHROID, LEVOTHROID) 112 MCG tablet Take 112 mcg by mouth daily.    metoprolol  succinate (TOPROL-XL) 50 MG 24 hr tablet TAKE 1 TABLET (50 MG TOTAL) BY MOUTH DAILY. Qty: 90 tablet, Refills: 3    niacin (NIASPAN) 1000 MG CR tablet TAKE 1 TABLET AT BEDTIME Qty: 90 tablet, Refills: 1    rosuvastatin (CRESTOR) 40 MG tablet Take 1 tablet (40 mg total) by mouth daily. Qty: 90 tablet, Refills: 3    terbinafine (LAMISIL) 1 % cream Apply 1 application  topically daily as needed (apply as directed after acid bath treatment).      STOP taking these medications     losartan (COZAAR) 100 MG tablet      mupirocin ointment (BACTROBAN) 2 %      naproxen sodium (ALEVE) 220 MG tablet        Allergies  Allergen Reactions  . No Known Allergies       The results of significant diagnostics from this hospitalization (including imaging, microbiology, ancillary and laboratory) are listed below for reference.    Significant Diagnostic Studies: Dg Chest 1 View  Result Date: 01/08/2016 CLINICAL DATA:  Post left thoracentesis. EXAM: CHEST 1 VIEW COMPARISON:  01/07/2016. FINDINGS: Minimal change in the loculated left pleural effusion/disease. Streaky densities left hilum may be related to atelectasis. No evidence for a pneumothorax. Right lung remains clear. Heart size is stable with median sternotomy wires present. IMPRESSION: Negative for pneumothorax following left thoracentesis. No significant change in the appearance of the left pleural disease. Electronically Signed   By: Markus Daft M.D.   On: 01/08/2016 12:04   Dg Chest 2 View  Result Date: 01/15/2016 CLINICAL DATA:  82 year old male with shortness of breath. EXAM: CHEST  2 VIEW COMPARISON:  Chest radiograph dated 01/14/2016 FINDINGS: Left-sided chest tube and left IJ central line in stable positioning. There has been interval improvement with near complete resolution of the left-sided pneumothorax. Minimal residual pneumothorax may be present along the left lateral pleural surface. There is consolidative changes of the left lower lobe. The right lung is clear. The cardiac silhouette is within normal limits. Median sternotomy wires and CABG vascular clips noted. Nondisplaced fracture of the lateral aspect of the left seventh rib. IMPRESSION: Left-sided chest tube and left IJ central line in stable position. Interval improvement with near complete resolution of the left-sided pneumothorax. Minimal  residual pneumothorax may be present not along the left lateral pleural surface. Left lung base consolidative changes similar to prior radiograph. Fracture of the lateral aspect of the left seventh rib. Electronically Signed   By: Anner Crete M.D.   On: 01/15/2016 07:05   Ct Chest Wo Contrast  Result Date: 01/07/2016 CLINICAL DATA:  Cough for several weeks. Left basilar infiltrate and loculated left pleural effusion suspicious for empyema. EXAM: CT CHEST WITHOUT CONTRAST TECHNIQUE: Multidetector CT imaging of the chest was performed following the standard protocol without IV contrast. COMPARISON:  Radiographs 01/07/2016 and 01/11/2008. FINDINGS: Cardiovascular: Status post median sternotomy and CABG. There is diffuse atherosclerosis of the aorta, great vessels and coronary arteries. Aortic valvular calcifications are present. The heart size is normal. There is no significant pericardial fluid. Mediastinum/Nodes: There is prominent subcarinal soft tissue mass consistent with adenopathy. This measures 12 mm short axis on image 29. No other enlarged mediastinal or hilar lymph nodes are identified. Hilar assessment is limited by the lack of intravenous contrast. The thyroid gland, trachea and esophagus demonstrate no significant findings. Lungs/Pleura: As seen on earlier radiographs, there is a moderate size loculated left pleural effusion. There are loculated components posterolaterally with extension into  the fissure. This effusion is suboptimally characterized without contrast, but measures near water density. There is no significant pleural fluid on the right. There is a small air- fluid level at the left lung base, measuring 1.8 cm on image 100 of series 5. This is likely intraparenchymal and is surrounded by a parenchymal opacity or mass measuring up to 5.2 x 5.6 cm on image 98. There is additional patchy airspace disease or atelectasis in the left lower lobe. The right lung is clear. Mild emphysematous  changes are present. Upper abdomen: Mild hepatic steatosis. There is a calcified 12 mm gallstone in the gallbladder neck. No gallbladder wall thickening or surrounding inflammation. No evidence of adrenal mass. There is some lobularity of the pancreatic tail which measures up to 3.9 cm on image 58. Musculoskeletal/Chest wall: There is no chest wall mass or suspicious osseous finding. Old healed rib fractures noted on the left. IMPRESSION: 1. Moderate size loculated left pleural effusion could reflect empyema in the appropriate clinical context. This is incompletely characterized by this noncontrast study. Consider thoracentesis. 2. Patchy left lower lobe airspace disease with possible cavitary pneumonia or mass inferiorly. Follow-up imaging post treatment for pneumonia recommended. 3. Diffuse atherosclerosis post CABG. Aortic valvular calcifications. 4. Cholelithiasis with possible pancreatic tail mass versus normal variant. Recommend follow-up abdominal CT at the time of chest CT follow-up (preferentially with intravenous contrast if possible). Electronically Signed   By: Richardean Sale M.D.   On: 01/07/2016 16:05   Dg Chest Port 1 View  Result Date: 01/14/2016 CLINICAL DATA:  Chest tube in place following empyema drainage EXAM: PORTABLE CHEST 1 VIEW COMPARISON:  01/13/2016 FINDINGS: Cardiac shadow is enlarged but stable. Postsurgical changes are again seen. A left chest tube is again noted as well as a left jugular central line. Small left pneumothorax is again seen laterally but stable. The right lung remains clear. Stable left basilar changes are noted. IMPRESSION: Stable left basilar changes. No significant change in left pneumothorax. Electronically Signed   By: Inez Catalina M.D.   On: 01/14/2016 07:44   Dg Chest Port 1 View  Result Date: 01/13/2016 CLINICAL DATA:  Status post left VATS and chest tube placement. EXAM: PORTABLE CHEST 1 VIEW COMPARISON:  Yesterday. FINDINGS: Two left chest tubes  remain in place. An approximately 5% left lateral and apical pneumothorax is demonstrated. No significant change in patchy opacity at the left lung base. Clear right lung. Mildly enlarged cardiac silhouette. Post CABG changes. Left jugular catheter tip in the superior vena cava. Left apical surgical clips and staples. Unremarkable bones. IMPRESSION: 1. Interval approximately 5% left pneumothorax. 2. Stable left basilar atelectasis, aspiration pneumonitis or pneumonia. Electronically Signed   By: Claudie Revering M.D.   On: 01/13/2016 07:17   Dg Chest Port 1 View  Result Date: 01/12/2016 CLINICAL DATA:  Followup a pneumothorax. EXAM: PORTABLE CHEST 1 VIEW COMPARISON:  01/11/2016 FINDINGS: Left chest tubes remain in place. Left internal jugular central line unchanged with tip in the SVC. No visible pneumothorax on today's film. Left lower lobe volume loss as seen previously. Mild atelectasis in the right lower lung. IMPRESSION: Lines and tubes unchanged. No visible pneumothorax today. Persistent infiltrate and volume loss in left lower lung. Electronically Signed   By: Nelson Chimes M.D.   On: 01/12/2016 07:40   Dg Chest Port 1 View  Result Date: 01/11/2016 CLINICAL DATA:  Status post left VATS. Left chest to and central line placement. EXAM: PORTABLE CHEST 1 VIEW COMPARISON:  Chest CT  01/07/2016 and single-view of the chest 01/08/2016. FINDINGS: New left IJ catheter is in place with the tip projecting in the mid superior vena cava. The patient also has 2 new left chest tubes. Very small left apical pneumothorax is noted. Left pleural effusion and basilar airspace disease are decreased. Minimal atelectasis in the right mid lung is noted. Heart size is normal. IMPRESSION: Left IJ catheter tip projects in the mid superior vena cava. Two left chest tubes in place. Very small left apical pneumothorax is seen. Decreased left pleural effusion and airspace disease after VATS. Electronically Signed   By: Inge Rise  M.D.   On: 01/11/2016 10:37   US Thoracentesis Asp Pleural Space W/img Guide  Result Date: 01/08/2016 INDICATION: Patient with history of coronary artery disease and prior CABG, leukocytosis, dyspnea, cough, loculated left pleural effusion. Request made for diagnostic and therapeutic left thoracentesis. EXAM: ULTRASOUND GUIDED DIAGNOSTIC AND THERAPEUTIC LEFT THORACENTESIS MEDICATIONS: None. COMPLICATIONS: None immediate. PROCEDURE: An ultrasound guided thoracentesis was thoroughly discussed with the patient and questions answered. The benefits, risks, alternatives and complications were also discussed. The patient understands and wishes to proceed with the procedure. Written consent was obtained. Ultrasound was performed to localize and mark an adequate pocket of fluid in the left chest. The area was then prepped and draped in the normal sterile fashion. 1% Lidocaine was used for local anesthesia. Under ultrasound guidance a Safe-T-Centesis catheter was introduced. Thoracentesis was performed. The catheter was removed and a dressing applied. FINDINGS: A total of approximately 50 cc of turbid, amber fluid was removed. Samples were sent to the laboratory as requested by the clinical team. Due to the extensive multiloculated nature of the pleural collection only the above amount of fluid could be removed at this time. IMPRESSION: Successful ultrasound guided diagnostic and therapeutic left thoracentesis yielding 50 cc of pleural fluid. If patient's symptoms/ clinical condition worsens, consider follow-up CT chest and TCTS consultation. Read by: Rowe Robert, PA-C Electronically Signed   By: Marybelle Killings M.D.   On: 01/08/2016 11:59    Microbiology: Recent Results (from the past 240 hour(s))  Blood culture (routine x 2)     Status: None   Collection Time: 01/07/16  2:20 PM  Result Value Ref Range Status   Specimen Description BLOOD LEFT FOREARM  Final   Special Requests BOTTLES DRAWN AEROBIC AND ANAEROBIC  5CC  Final   Culture NO GROWTH 5 DAYS  Final   Report Status 01/12/2016 FINAL  Final  Blood culture (routine x 2)     Status: None   Collection Time: 01/07/16  3:05 PM  Result Value Ref Range Status   Specimen Description BLOOD RIGHT FOREARM  Final   Special Requests BOTTLES DRAWN AEROBIC AND ANAEROBIC 5CC  Final   Culture NO GROWTH 5 DAYS  Final   Report Status 01/12/2016 FINAL  Final  Culture, body fluid-bottle     Status: None   Collection Time: 01/08/16 11:29 AM  Result Value Ref Range Status   Specimen Description PLEURAL LEFT  Final   Special Requests NONE  Final   Culture NO GROWTH 5 DAYS  Final   Report Status 01/13/2016 FINAL  Final  Gram stain     Status: None   Collection Time: 01/08/16 11:29 AM  Result Value Ref Range Status   Specimen Description PLEURAL LEFT  Final   Special Requests NONE  Final   Gram Stain   Final    FEW WBC PRESENT,BOTH PMN AND MONONUCLEAR NO ORGANISMS SEEN  Report Status 01/09/2016 FINAL  Final  Surgical pcr screen     Status: None   Collection Time: 01/10/16  6:02 AM  Result Value Ref Range Status   MRSA, PCR NEGATIVE NEGATIVE Final   Staphylococcus aureus NEGATIVE NEGATIVE Final    Comment:        The Xpert SA Assay (FDA approved for NASAL specimens in patients over 93 years of age), is one component of a comprehensive surveillance program.  Test performance has been validated by [facility] for patients greater than or equal to 29 year old. It is not intended to diagnose infection nor to guide or monitor treatment.   Fungus Culture With Stain     Status: None (Preliminary result)   Collection Time: 01/11/16  8:34 AM  Result Value Ref Range Status   Fungus Stain Final report  Final    Comment: (NOTE) Performed At: [facility] [place], [place] [id number] Lindon Romp MD [id number]    Fungus (Mycology) Culture PENDING  Incomplete   Fungal Source PLEURAL  Final    Comment: LEFT  Acid Fast Smear  (AFB)     Status: None   Collection Time: 01/11/16  8:34 AM  Result Value Ref Range Status   AFB Specimen Processing Concentration  Final   Acid Fast Smear Negative  Final    Comment: (NOTE) Performed At: [facility] [address] [place], [place] [id number] Lindon Romp MD [id number]    Source (AFB) PLEURAL  Final    Comment: LEFT  Culture, body fluid-bottle     Status: None (Preliminary result)   Collection Time: 01/11/16  8:34 AM  Result Value Ref Range Status   Specimen Description PLEURAL LEFT  Final   Special Requests NONE  Final   Culture NO GROWTH 4 DAYS  Final   Report Status PENDING  Incomplete  Gram stain     Status: None   Collection Time: 01/11/16  8:34 AM  Result Value Ref Range Status   Specimen Description PLEURAL LEFT  Final   Special Requests NONE  Final   Gram Stain   Final    ABUNDANT WBC PRESENT, PREDOMINANTLY PMN NO ORGANISMS SEEN    Report Status 01/12/2016 FINAL  Final  Fungus Culture Result     Status: None   Collection Time: 01/11/16  8:34 AM  Result Value Ref Range Status   Result 1 Comment  Final    Comment: (NOTE) KOH/Calcofluor preparation:  no fungus observed. Performed At: [facility] [place], [place] [id number] Lindon Romp MD [id number]   Aerobic/Anaerobic Culture (surgical/deep wound)     Status: None (Preliminary result)   Collection Time: 01/11/16  8:42 AM  Result Value Ref Range Status   Specimen Description PLEURAL LEFT  Final   Special Requests SPEC C ON SWABS  Final   Gram Stain   Final    ABUNDANT WBC PRESENT, PREDOMINANTLY PMN MODERATE GRAM VARIABLE COCCI    Culture   Final    FEW MICROAEROPHILIC STREPTOCOCCI Standardized susceptibility testing for this organism is not available. NO ANAEROBES ISOLATED; CULTURE IN PROGRESS FOR 5 DAYS    Report Status PENDING  Incomplete  Aerobic Culture (superficial specimen)     Status: None   Collection Time: 01/11/16  8:55 AM  Result  Value Ref Range Status   Specimen Description TISSUE LEFT PLEURAL  Final   Special Requests   Final    LEFT PLEURAL PEEL PATIENT ON FOLLOWING  ZINACEF AZITHROMYCIN AND CEFTIN   Gram Stain   Final    ABUNDANT WBC PRESENT,BOTH PMN AND MONONUCLEAR NO ORGANISMS SEEN    Culture NO GROWTH 2 DAYS  Final   Report Status 01/13/2016 FINAL  Final     Labs: Basic Metabolic Panel:  Recent Labs Lab 01/10/16 0916 01/12/16 0440 01/13/16 0500 01/14/16 0430 01/14/16 0440 01/15/16 0407  NA 134* 132* 133*  --  131* 133*  K 4.3 3.6 3.2*  --  3.0* 3.5  CL 103 102 99*  --  97* 99*  CO2 23 23 21*  --  24 26  GLUCOSE 266* 182* 100*  --  99 127*  BUN 13 12 14   --  16 14  CREATININE 1.52* 1.38* 1.56*  --  1.60* 1.48*  CALCIUM 9.2 8.8* 8.7*  --  8.5* 8.3*  MG  --   --   --  2.2  --   --    Liver Function Tests:  Recent Labs Lab 01/10/16 0916 01/13/16 0500 01/15/16 0407  AST 107* 39 53*  ALT 101* 60 56  ALKPHOS 188* 128* 116  BILITOT 0.3 0.5 0.7  PROT 6.7 5.8* 5.6*  ALBUMIN 2.0* 1.5* 1.7*   No results for input(s): LIPASE, AMYLASE in the last 168 hours. No results for input(s): AMMONIA in the last 168 hours. CBC:  Recent Labs Lab 01/09/16 0940 01/10/16 0354 01/10/16 0916 01/12/16 0440 01/13/16 0500 01/14/16 0440 01/15/16 0407  WBC 22.9* 18.1* 23.8* 17.2* 19.4* 15.2* 12.0*  NEUTROABS 20.4* 16.0*  --   --   --   --   --   HGB 9.5* 9.2* 10.3* 8.7* 9.0* 9.1* 8.7*  HCT 28.8* 28.3* 31.2* 26.4* 27.7* 27.3* 26.3*  MCV 94.4 95.0 95.1 94.3 93.9 91.9 92.3  PLT 500* 455* 531* 455* 446* 465* 450*   Cardiac Enzymes: No results for input(s): CKTOTAL, CKMB, CKMBINDEX, TROPONINI in the last 168 hours. BNP: BNP (last 3 results) No results for input(s): BNP in the last 8760 hours.  ProBNP (last 3 results) No results for input(s): PROBNP in the last 8760 hours.  CBG:  Recent Labs Lab 01/14/16 2348 01/15/16 0453 01/15/16 1129 01/15/16 1619 01/15/16 2114  GLUCAP 98 143* 148* 140*  187*       SignedDomenic Polite MD.  [facility] 01/15/2016, 10:10 PM

## 2016-01-15 NOTE — Progress Notes (Signed)
4 Days Post-Op Procedure(s) (LRB): VIDEO ASSISTED THORACOSCOPY (VATS)/DECORTICATION (Left) EMPYEMA DRAINAGE (Left) Subjective: Patient feeling better Last chest tube removed  Operative cultures were returned microaerophilic Streptococcus continue IV Zosyn until discharge than transition to oral Augmentin Transferred to telemetry bed  bjective: Vital signs in last 24 hours: Temp:  [97.7 F (36.5 C)-99 F (37.2 C)] 97.7 F (36.5 C) (12/19 1144) Pulse Rate:  [52-80] 72 (12/19 1427) Cardiac Rhythm: Normal sinus rhythm (12/19 1211) Resp:  [22] 22 (12/19 0800) BP: (103-146)/(47-92) 134/47 (12/19 1144) SpO2:  [94 %-98 %] 95 % (12/19 1427)  Hemodynamic parameters for last 24 hours:    Intake/Output from previous day: 12/18 0701 - 12/19 0700 In: 2145 [I.V.:1530; IV Piggyback:615] Out: 585 [Urine:425; Chest Tube:160] Intake/Output this shift: Total I/O In: 75 [I.V.:75] Out: -        Exam    General- alert and comfortable   Lungs- clear without rales, wheezes   Cor- regular rate and rhythm, no murmur , gallop   Abdomen- soft, non-tender   Extremities - warm, non-tender, minimal edema   Neuro- oriented, appropriate, no focal weakness   Lab Results:  Recent Labs  01/14/16 0440 01/15/16 0407  WBC 15.2* 12.0*  HGB 9.1* 8.7*  HCT 27.3* 26.3*  PLT 465* 450*   BMET:  Recent Labs  01/14/16 0440 01/15/16 0407  NA 131* 133*  K 3.0* 3.5  CL 97* 99*  CO2 24 26  GLUCOSE 99 127*  BUN 16 14  CREATININE 1.60* 1.48*  CALCIUM 8.5* 8.3*    PT/INR: No results for input(s): LABPROT, INR in the last 72 hours. ABG    Component Value Date/Time   PHART 7.374 01/12/2016 0406   HCO3 22.9 01/12/2016 0406   TCO2 24 01/12/2016 0406   ACIDBASEDEF 2.0 01/12/2016 0406   O2SAT 91.0 01/12/2016 0406   CBG (last 3)   Recent Labs  01/15/16 0453 01/15/16 1129 01/15/16 1619  GLUCAP 143* 148* 140*    Assessment/Plan: S/P Procedure(s) (LRB): VIDEO ASSISTED THORACOSCOPY  (VATS)/DECORTICATION (Left) EMPYEMA DRAINAGE (Left) Chest x-ray improved  postop edema improved DC PCA and final chest tube Should be ready for discharge home and 24-48 hours. Will  Need 14 days oral Augmentin   LOS: 8 days    Tharon Aquas Trigt III 01/15/2016

## 2016-01-15 NOTE — Care Management Note (Signed)
Case Management Note  Patient Details  Name: Dillon [name] MRN: [id number] Date of Birth: 09-Oct-1933  Subjective/Objective:  Pt lives with wife who will be available 24/7 as needed.  Pt states he walked back from radiology this a.m., staff note he is doing very well with ambulation.                   Expected Discharge Plan:  Home/Self Care  Discharge planning Services  CM Consult  Status of Service:  In process, will continue to follow  Girard Cooter, RN 01/15/2016, 1:25 PM

## 2016-01-16 ENCOUNTER — Inpatient Hospital Stay (HOSPITAL_COMMUNITY): Payer: Medicare Other

## 2016-01-16 LAB — CBC
HEMATOCRIT: 27.1 % — AB (ref 39.0–52.0)
HEMOGLOBIN: 8.9 g/dL — AB (ref 13.0–17.0)
MCH: 30.4 pg (ref 26.0–34.0)
MCHC: 32.8 g/dL (ref 30.0–36.0)
MCV: 92.5 fL (ref 78.0–100.0)
PLATELETS: 405 10*3/uL — AB (ref 150–400)
RBC: 2.93 MIL/uL — AB (ref 4.22–5.81)
RDW: 14.6 % (ref 11.5–15.5)
WBC: 12.9 10*3/uL — AB (ref 4.0–10.5)

## 2016-01-16 LAB — BASIC METABOLIC PANEL
ANION GAP: 6 (ref 5–15)
BUN: 11 mg/dL (ref 6–20)
CHLORIDE: 104 mmol/L (ref 101–111)
CO2: 23 mmol/L (ref 22–32)
Calcium: 8.4 mg/dL — ABNORMAL LOW (ref 8.9–10.3)
Creatinine, Ser: 1.46 mg/dL — ABNORMAL HIGH (ref 0.61–1.24)
GFR calc Af Amer: 51 mL/min — ABNORMAL LOW (ref >60)
GFR, EST NON AFRICAN AMERICAN: 44 mL/min — AB (ref >60)
GLUCOSE: 131 mg/dL — AB (ref 65–99)
POTASSIUM: 4.2 mmol/L (ref 3.5–5.1)
Sodium: 133 mmol/L — ABNORMAL LOW (ref 135–145)

## 2016-01-16 LAB — AEROBIC/ANAEROBIC CULTURE W GRAM STAIN (SURGICAL/DEEP WOUND)

## 2016-01-16 LAB — GLUCOSE, CAPILLARY: Glucose-Capillary: 159 mg/dL — ABNORMAL HIGH (ref 65–99)

## 2016-01-16 LAB — CULTURE, BODY FLUID W GRAM STAIN -BOTTLE

## 2016-01-16 LAB — CULTURE, BODY FLUID-BOTTLE: CULTURE: NO GROWTH

## 2016-01-16 MED ORDER — AMOXICILLIN-POT CLAVULANATE 875-125 MG PO TABS: 1.0000 | ORAL_TABLET | Freq: Two times a day (BID) | ORAL | 0 refills | Status: DC

## 2016-01-16 NOTE — Discharge Instructions (Signed)
Pleas apply dry 4x4s to left chest tube sites with tape. Change daily and PRN. Once drainage stops, let open to the air.   Thoracotomy, Care After This sheet gives you information about how to care for yourself after your procedure. Your doctor may also give you more specific instructions. If you have problems or questions, contact your doctor. Follow these instructions at home: Preventing lung infection ( pneumonia)  Take deep breaths or do breathing exercises as told by your doctor.  Cough often. Coughing is important to clear thick spit (phlegm) and open your lungs. If coughing hurts, hold a pillow against your chest or place both hands flat on top of your cut (splinting) when you cough. This may help with discomfort.  Use an incentive spirometer as told. This is a tool that measures how well you fill your lungs with each breath.  Do lung therapy (pulmonary rehabilitation) as told. Medicines  Take over-the-counter or prescription medicines only as told by your doctor.  If you have pain, take pain-relieving medicine before your pain gets very bad. This will help you breathe and cough more comfortably.  If you were prescribed an antibiotic medicine, take it as told by your doctor. Do not stop taking the antibiotic even if you start to feel better. Activity  Ask your doctor what activities are safe for you.  Do not travel by airplane for 2 weeks after your chest tube is removed, or until your doctor says that this is safe.  Do not lift anything that is heavier than 10 lb (4.5 kg), or the limit that your doctor tells you, until he or she says that it is safe.  Do not drive until your doctor approves.  Do not drive or use heavy machinery while taking prescription pain medicine. Incision care  Follow instructions from your doctor about how to take care of your cut from surgery (incision). Make sure you:  Wash your hands with soap and water before you change your bandage (dressing).  If you cannot use soap and water, use hand sanitizer.  Change your bandage as told by your doctor.  Leave stitches (sutures), skin glue, or skin tape (adhesive) strips in place. They may need to stay in place for 2 weeks or longer. If tape strips get loose and curl up, you may trim the loose edges. Do not remove tape strips completely unless your doctor says it is okay.  Keep your bandage dry.  Check your cut from surgery every day for signs of infection. Check for:  More redness, swelling, or pain.  More fluid or blood.  Warmth.  Pus or a bad smell. Bathing  Do not take baths, swim, or use a hot tub until your doctor approves. You may take showers.  After your bandage has been removed, use soap and water to gently wash your cut from surgery. Do not use anything else to clean your cut unless your doctor tells you to. Eating and drinking  Eat a healthy diet as told by your doctor. A healthy diet includes:  Fresh fruits and vegetables.  Whole grains.  Low-fat (lean) proteins.  Drink enough fluid to keep your pee (urine) clear or pale yellow. General instructions  To prevent or treat trouble pooping (constipation) while you are taking prescription pain medicine, your doctor may recommend that you:  Take over-the-counter or prescription medicines.  Eat foods that are high in fiber. These include fresh fruits and vegetables, whole grains, and beans.  Limit foods that are high  in fat and processed sugars, such as fried and sweet foods.  Do not use any products that contain nicotine or tobacco. These include cigarettes and e-cigarettes. If you need help quitting, ask your doctor.  Avoid secondhand smoke.  Wear compression stockings as told. These help to prevent blood clots and reduce swelling in your legs.  If you have a chest tube, care for it as told.  Keep all follow-up visits as told by your doctor. This is important. Contact a doctor if:  You have more redness,  swelling, or pain around your cut from surgery.  You have more fluid or blood coming from your cut from surgery.  Your cut from surgery feels warm to the touch.  You have pus or a bad smell coming from your cut from surgery.  You have a fever or chills.  Your heartbeat seems uneven.  You feel sick to your stomach (nauseous).  You throw up (vomit).  You have muscle aches.  You have trouble pooping (having a bowel movement). This may mean that you:  Poop fewer times in a week than normal.  Have a hard time pooping.  Have poop that is dry, hard, or bigger than normal. Get help right away if:  You get a rash.  You feel light-headed.  You feel like you might pass out (faint).  You are short of breath.  You have trouble breathing.  You are confused.  You have trouble talking.  You have problems with your seeing (vision).  You are not able to move.  You lose feeling (have numbness) in your:  Face.  Arms.  Legs.  You pass out.  You have a sudden, bad headache.  You feel weak.  You have chest pain.  You have pain that:  Is very bad.  Gets worse, even with medicine. Summary  Take deep breaths, do breathing exercises, and cough often. This helps prevent lung infection (pneumonia).  Do not drive until your doctor approves. Do not travel by airplane for 2 weeks after your chest tube is removed, or until your doctor says that this is safe.  Check your cut from surgery every day for signs of infection.  Eat a healthy diet. This includes fresh fruits and vegetables, whole grains, and low-fat (lean) proteins. This information is not intended to replace advice given to you by your health care provider. Make sure you discuss any questions you have with your health care provider. Document Released: 07/15/2011 Document Revised: 10/08/2015 Document Reviewed: 10/08/2015 Elsevier Interactive Patient Education  2017 [employer].

## 2016-01-16 NOTE — Care Management Note (Signed)
Case Management Note Previous CM note initiated by Girard Cooter, RN 01/15/2016, 1:25 PM  Patient Details  Name: Dillon [name] MRN: [id number] Date of Birth: 12-Jan-1934  Subjective/Objective:  Pt lives with wife who will be available 24/7 as needed.  Pt states he walked back from radiology this a.m., staff note he is doing very well with ambulation.                    Action/Plan: Pt tx from ICU to 2W on 01/15/16  Expected Discharge Date:    01/16/16              Expected Discharge Plan:  Home/Self Care  In-House Referral:     Discharge planning Services  CM Consult  Post Acute Care Choice:  NA Choice offered to:     DME Arranged:    DME Agency:     HH Arranged:    [place] Agency:     Status of Service:  Completed, signed off  If discussed at [place] of Stay Meetings, dates discussed:   Discharge Disposition: home /self care   Additional Comments:  01/16/16- 1215- Valentina Gu, CM- pt for d/c home today- no CM needs noted  Dawayne Patricia, RN 01/16/2016, 12:13 PM [phone]

## 2016-01-16 NOTE — Progress Notes (Addendum)
      [address]       [place] [address]             [phone]       5 Days Post-Op Procedure(s) (LRB): VIDEO ASSISTED THORACOSCOPY (VATS)/DECORTICATION (Left) EMPYEMA DRAINAGE (Left)  Subjective: Patient hoping to go home today.  Objective: Vital signs in last 24 hours: Temp:  [97.7 F (36.5 C)-98.4 F (36.9 C)] 98.2 F (36.8 C) (12/20 0454) Pulse Rate:  [52-80] 79 (12/20 0454) Cardiac Rhythm: Other (Comment) (12/20 0700) Resp:  [16-19] 19 (12/20 0454) BP: (127-153)/(47-54) 153/52 (12/20 0454) SpO2:  [93 %-96 %] 96 % (12/20 0454)     Intake/Output from previous day: 12/19 0701 - 12/20 0700 In: 315 [P.O.:240; I.V.:75] Out: -    Physical Exam:  Cardiovascular: RRR Pulmonary: Clear to auscultation on the right and slightly diminished left base Wounds: Clean and dry.  No erythema or signs of infection. There is sero sanguinous drainange from chest tube sites.   Lab Results: CBC: Recent Labs  01/15/16 0407 01/16/16 0310  WBC 12.0* 12.9*  HGB 8.7* 8.9*  HCT 26.3* 27.1*  PLT 450* 405*   BMET:  Recent Labs  01/15/16 0407 01/16/16 0310  NA 133* 133*  K 3.5 4.2  CL 99* 104  CO2 26 23  GLUCOSE 127* 131*  BUN 14 11  CREATININE 1.48* 1.46*  CALCIUM 8.3* 8.4*    PT/INR: No results for input(s): LABPROT, INR in the last 72 hours. ABG:  INR: Will add last result for INR, ABG once components are confirmed Will add last 4 CBG results once components are confirmed  Assessment/Plan:  1. CV - SR in the 80's. On Toprol XL 25 mg daily, Norvasc 5 mg daily, and Plavix 75 mg daily. 2.  Pulmonary - On room air. 3. Anemia-H and H stable at 8.9 and 27.1 4. Creatinine remains 1.46 (admission creatinine 1.77). 5.ID-On Zosyn for empyema 6. Possible pancreatic tail mass-needs follow up CT after discharge. 7. Disposition per medicine.  ZIMMERMAN,DONIELLE MPA-C 01/16/2016,8:54 AM  CXR today looks good  ready for DC- Augmentin bid for 14 days  for microaerophilic streptococcus patient examined and medical record reviewed,agree with above note. Tharon Aquas Trigt III 01/16/2016

## 2016-01-16 NOTE — Progress Notes (Signed)
Pt discharged to home pre order. Discharge instructions reviewed, pt's wife at bedside. Medications and future care appointments discussed.  IV and tele monitor removed per protocol. Pt off unit via wheelchair and hospital volunteer. Margreta Journey RN

## 2016-01-16 NOTE — Progress Notes (Signed)
OT Cancellation Note  Patient Details Name: Dillon [name] MRN: [id number] DOB: 10/11/1933   Cancelled Treatment:    Reason Eval/Treat Not Completed: OT screened, no needs identified, will sign off.  [place], OTR/L [id number]   Lucille Passy M 01/16/2016, 11:15 AM

## 2016-01-18 ENCOUNTER — Other Ambulatory Visit: Payer: Self-pay

## 2016-01-18 DIAGNOSIS — J90 Pleural effusion, not elsewhere classified: Secondary | ICD-10-CM

## 2016-01-18 DIAGNOSIS — J869 Pyothorax without fistula: Secondary | ICD-10-CM

## 2016-01-29 ENCOUNTER — Other Ambulatory Visit: Payer: Self-pay | Admitting: Cardiothoracic Surgery

## 2016-01-29 DIAGNOSIS — J869 Pyothorax without fistula: Secondary | ICD-10-CM

## 2016-01-30 ENCOUNTER — Encounter: Payer: Self-pay | Admitting: Physician Assistant

## 2016-01-30 ENCOUNTER — Ambulatory Visit
Admission: RE | Admit: 2016-01-30 | Discharge: 2016-01-30 | Disposition: A | Discharge status: Home / Self Care | Payer: Medicare Other | Source: Ambulatory Visit | Attending: Cardiothoracic Surgery | Admitting: Cardiothoracic Surgery

## 2016-01-30 ENCOUNTER — Ambulatory Visit (INDEPENDENT_AMBULATORY_CARE_PROVIDER_SITE_OTHER): Payer: Self-pay | Admitting: Physician Assistant

## 2016-01-30 VITALS — BP 125/71 | HR 62 | Resp 16 | Ht 65.5 in | Wt 159.5 lb

## 2016-01-30 DIAGNOSIS — E785 Hyperlipidemia, unspecified: Secondary | ICD-10-CM | POA: Diagnosis not present

## 2016-01-30 DIAGNOSIS — S2242XA Multiple fractures of ribs, left side, initial encounter for closed fracture: Secondary | ICD-10-CM | POA: Diagnosis not present

## 2016-01-30 DIAGNOSIS — I159 Secondary hypertension, unspecified: Secondary | ICD-10-CM | POA: Diagnosis not present

## 2016-01-30 DIAGNOSIS — E079 Disorder of thyroid, unspecified: Secondary | ICD-10-CM | POA: Diagnosis not present

## 2016-01-30 DIAGNOSIS — J869 Pyothorax without fistula: Secondary | ICD-10-CM

## 2016-01-30 DIAGNOSIS — R938 Abnormal findings on diagnostic imaging of other specified body structures: Secondary | ICD-10-CM | POA: Diagnosis not present

## 2016-01-30 DIAGNOSIS — Z6826 Body mass index (BMI) 26.0-26.9, adult: Secondary | ICD-10-CM | POA: Diagnosis not present

## 2016-01-30 DIAGNOSIS — R7989 Other specified abnormal findings of blood chemistry: Secondary | ICD-10-CM | POA: Diagnosis not present

## 2016-01-30 DIAGNOSIS — Z09 Encounter for follow-up examination after completed treatment for conditions other than malignant neoplasm: Secondary | ICD-10-CM

## 2016-01-30 NOTE — Progress Notes (Addendum)
  HPI:  Patient returns for routine postoperative follow-up having undergone a left VATS, left mini thoracotomy, decortication of the LLL, and On Q placement by Dr. Prescott Gum on 01/11/2016. He was on Rocephin and Azitrhromax initially. He was then put on Vancomycin and Zosyn. Culture showed Microaerophilic Strep. He was discharged in stable condition on 01/15/2016 with oral Augmentin. Since hospital discharge the patient reports, he has some numbness anterior to incision.   Current Outpatient Prescriptions  Medication Sig Dispense Refill  . amLODipine (NORVASC) 5 MG tablet Take 1 tablet (5 mg total) by mouth daily. (Patient taking differently: Take 5 mg by mouth at bedtime. ) 90 tablet 3  . amoxicillin-clavulanate (AUGMENTIN) 875-125 MG tablet Take 1 tablet by mouth 2 (two) times daily. For 6days 12 tablet 0  . aspirin EC 81 MG tablet Take 81 mg by mouth at bedtime.     . Cholecalciferol (VITAMIN D) 2000 units tablet Take 2,000 Units by mouth at bedtime.    . clopidogrel (PLAVIX) 75 MG tablet Take 1 tablet (75 mg total) by mouth daily. 90 tablet 3  . fexofenadine (ALLEGRA) 180 MG tablet Take 180 mg by mouth daily.    . folic acid (FOLVITE) 1 MG tablet TAKE 1 TABLET EVERY DAY 90 tablet 1  . levothyroxine (SYNTHROID, LEVOTHROID) 112 MCG tablet Take 112 mcg by mouth daily.    . metoprolol succinate (TOPROL-XL) 50 MG 24 hr tablet TAKE 1 TABLET (50 MG TOTAL) BY MOUTH DAILY. 90 tablet 3  . niacin (NIASPAN) 1000 MG CR tablet TAKE 1 TABLET AT BEDTIME 90 tablet 1  . rosuvastatin (CRESTOR) 40 MG tablet Take 1 tablet (40 mg total) by mouth daily. (Patient taking differently: Take 40 mg by mouth at bedtime. ) 90 tablet 3  . terbinafine (LAMISIL) 1 % cream Apply 1 application topically daily as needed (apply as directed after acid bath treatment).    Vital Signs: BP 125/71 HR 62  RR 16 Oxygen saturation 98 % on room air   Physical Exam: CV- RRR Pulmonary- Clear to auscultation bilaterally Wounds-Clean  and dry. 2 Silk sutures removed at chest tube sites.  Diagnostic Tests: EXAM: CHEST  2 VIEW  COMPARISON:  TWO-VIEW CHEST X-RAY 01/16/2016  FINDINGS: The heart size is normal. And posterolateral left lower lobe collection is decreased in size. Associated airspace disease is diminished as well. Lung volumes remain low.  Left-sided rib fractures are unchanged.  There is no pneumothorax.  IMPRESSION: 1. Slight decrease and left posterior and lateral pleural collection. 2. Decreasing airspace disease. 3. Left-sided rib fractures.   Electronically Signed   By: San Morelle M.D.   On: 01/30/2016 12:44  Impression and Plan: Overall, Dillon [name] is recovering well from left VATS and mini thoracotomy. Regarding the numbness below his left incision, this is related to the nerves healing. Hopefully, with more time this should improve. He was inquiring about playing golf. He was instructed he may do so this spring. He is already driving. He will return to see Dr.Van Trgit in 2 months with a PA/LAT CXR for final follow up.    Dillon Skillern, PA-C [facility] [phone]

## 2016-01-30 NOTE — Patient Instructions (Signed)
You may return to driving an automobile as long as you are no longer requiring oral narcotic pain relievers during the daytime.  It would be wise to start driving only short distances during the daylight and gradually increase from there as you feel comfortable.  You may continue to gradually increase your physical activity as tolerated.  Refrain from any heavy lifting or strenuous activity until at least 1 week from the time of your surgery, and avoid activities that cause increased pain in your chest on the side of your surgical incision.  Otherwise, you may continue to increase activities without any particular limitations.  Increase the intensity and duration of physical activity gradually.

## 2016-02-05 DIAGNOSIS — M5126 Other intervertebral disc displacement, lumbar region: Secondary | ICD-10-CM | POA: Diagnosis not present

## 2016-02-05 DIAGNOSIS — K449 Diaphragmatic hernia without obstruction or gangrene: Secondary | ICD-10-CM | POA: Diagnosis not present

## 2016-02-05 DIAGNOSIS — J869 Pyothorax without fistula: Secondary | ICD-10-CM | POA: Diagnosis not present

## 2016-02-05 DIAGNOSIS — I080 Rheumatic disorders of both mitral and aortic valves: Secondary | ICD-10-CM | POA: Diagnosis not present

## 2016-02-05 DIAGNOSIS — K5750 Diverticulosis of both small and large intestine without perforation or abscess without bleeding: Secondary | ICD-10-CM | POA: Diagnosis not present

## 2016-02-05 DIAGNOSIS — K8020 Calculus of gallbladder without cholecystitis without obstruction: Secondary | ICD-10-CM | POA: Diagnosis not present

## 2016-02-05 DIAGNOSIS — R938 Abnormal findings on diagnostic imaging of other specified body structures: Secondary | ICD-10-CM | POA: Diagnosis not present

## 2016-02-05 DIAGNOSIS — K8689 Other specified diseases of pancreas: Secondary | ICD-10-CM | POA: Diagnosis not present

## 2016-02-05 DIAGNOSIS — R7989 Other specified abnormal findings of blood chemistry: Secondary | ICD-10-CM | POA: Diagnosis not present

## 2016-02-11 LAB — FUNGAL ORGANISM REFLEX

## 2016-02-11 LAB — FUNGUS CULTURE RESULT

## 2016-02-11 LAB — FUNGUS CULTURE WITH STAIN

## 2016-02-24 ENCOUNTER — Other Ambulatory Visit: Payer: Self-pay | Admitting: Cardiology

## 2016-03-10 ENCOUNTER — Telehealth: Payer: Self-pay | Admitting: Cardiovascular Disease

## 2016-03-10 NOTE — Telephone Encounter (Signed)
Follow Up   Request for surgical clearance:  1. What type of surgery is being performed? Right CA  2. When is this surgery scheduled? 03/26/16  3. Are there any medications that need to be held prior to surgery and how long? Advised pt to hold plavix 5 days prior to procedure   4. Name of physician performing surgery?  Dr [name]   5. What is your office phone and fax number? Fax [phone] ofc [phone]

## 2016-03-10 NOTE — Telephone Encounter (Signed)
Clearance request sent to provider for review.

## 2016-03-14 ENCOUNTER — Other Ambulatory Visit: Payer: Self-pay

## 2016-03-14 NOTE — Telephone Encounter (Signed)
Clearance faxed back to [facility] today. Patient notified.

## 2016-03-18 ENCOUNTER — Encounter (HOSPITAL_COMMUNITY): Payer: Self-pay

## 2016-03-18 ENCOUNTER — Encounter (HOSPITAL_COMMUNITY)
Admission: RE | Admit: 2016-03-18 | Discharge: 2016-03-18 | Disposition: A | Discharge status: Home / Self Care | Payer: Medicare Other | Source: Ambulatory Visit | Attending: Surgery | Admitting: Surgery

## 2016-03-18 DIAGNOSIS — Z01818 Encounter for other preprocedural examination: Secondary | ICD-10-CM | POA: Insufficient documentation

## 2016-03-18 DIAGNOSIS — I6521 Occlusion and stenosis of right carotid artery: Secondary | ICD-10-CM | POA: Insufficient documentation

## 2016-03-18 HISTORY — DX: Hypothyroidism, unspecified: E03.9

## 2016-03-18 HISTORY — DX: Cardiac murmur, unspecified: R01.1

## 2016-03-18 HISTORY — DX: Personal history of other diseases of the digestive system: Z87.19

## 2016-03-18 HISTORY — DX: Pneumonia, unspecified organism: J18.9

## 2016-03-18 HISTORY — DX: Essential (primary) hypertension: I10

## 2016-03-18 LAB — URINALYSIS, ROUTINE W REFLEX MICROSCOPIC
Bilirubin Urine: NEGATIVE
GLUCOSE, UA: NEGATIVE mg/dL
HGB URINE DIPSTICK: NEGATIVE
Ketones, ur: NEGATIVE mg/dL
LEUKOCYTES UA: NEGATIVE
Nitrite: NEGATIVE
Protein, ur: NEGATIVE mg/dL
SPECIFIC GRAVITY, URINE: 1.008 (ref 1.005–1.030)
pH: 7 (ref 5.0–8.0)

## 2016-03-18 LAB — COMPREHENSIVE METABOLIC PANEL
ALBUMIN: 4.2 g/dL (ref 3.5–5.0)
ALT: 16 U/L — ABNORMAL LOW (ref 17–63)
ANION GAP: 8 (ref 5–15)
AST: 22 U/L (ref 15–41)
Alkaline Phosphatase: 70 U/L (ref 38–126)
BUN: 17 mg/dL (ref 6–20)
CHLORIDE: 103 mmol/L (ref 101–111)
CO2: 24 mmol/L (ref 22–32)
Calcium: 9.9 mg/dL (ref 8.9–10.3)
Creatinine, Ser: 1.06 mg/dL (ref 0.61–1.24)
GFR calc Af Amer: >60 mL/min (ref >60)
GFR calc non Af Amer: >60 mL/min (ref >60)
GLUCOSE: 96 mg/dL (ref 65–99)
POTASSIUM: 4.5 mmol/L (ref 3.5–5.1)
Sodium: 135 mmol/L (ref 135–145)
TOTAL PROTEIN: 7.7 g/dL (ref 6.5–8.1)
Total Bilirubin: 0.6 mg/dL (ref 0.3–1.2)

## 2016-03-18 LAB — SURGICAL PCR SCREEN
MRSA, PCR: NEGATIVE
STAPHYLOCOCCUS AUREUS: NEGATIVE

## 2016-03-18 LAB — TYPE AND SCREEN
ABO/RH(D): O POS
Antibody Screen: NEGATIVE

## 2016-03-18 LAB — CBC
HEMATOCRIT: 38.2 % — AB (ref 39.0–52.0)
Hemoglobin: 12.5 g/dL — ABNORMAL LOW (ref 13.0–17.0)
MCH: 31 pg (ref 26.0–34.0)
MCHC: 32.7 g/dL (ref 30.0–36.0)
MCV: 94.8 fL (ref 78.0–100.0)
PLATELETS: 226 10*3/uL (ref 150–400)
RBC: 4.03 MIL/uL — ABNORMAL LOW (ref 4.22–5.81)
RDW: 15.3 % (ref 11.5–15.5)
WBC: 6.8 10*3/uL (ref 4.0–10.5)

## 2016-03-18 LAB — PROTIME-INR
INR: 1
Prothrombin Time: 13.2 seconds (ref 11.4–15.2)

## 2016-03-18 LAB — APTT: APTT: 27 s (ref 24–36)

## 2016-03-18 MED ORDER — CHLORHEXIDINE GLUCONATE 4 % EX LIQD: 60.0000 mL | Freq: Once | CUTANEOUS | Status: DC

## 2016-03-18 NOTE — Pre-Procedure Instructions (Addendum)
    Dillon [name]  03/18/2016      [facility] # [address] [place], [place] [address] Phone: [phone] Fax: [phone]  [facility] - [place], [place] [address] Phone: [phone] Fax: [phone]    Your procedure is scheduled on Wednesday, February 28th   Report to [facility] Admitting at 7:50 AM   Call this number if you have problems the [facility] of surgery:  [phone].  Short Stay is closed on the weekends.   Remember:  Do not eat food or drink liquids after midnight Tuesday.   Take these medicines the morning of surgery with A SIP OF WATER : Levothyroxine, Metoprolol.              7 days prior to surgery, STOP taking any Vitamins, Herbal Supplements, Anti-inflammatories.  Plavix, aspirin per dr   Lazaro Arms not wear jewelry - no rings or watches.  Do not wear lotions, colognes or deoderant.             Men may shave face and neck.   Do not bring valuables to the hospital.  [facility] is not responsible for any belongings or valuables.  Contacts, dentures or bridgework may not be worn into surgery.  Leave your suitcase in the car.  After surgery it may be brought to your room. For patients admitted to the hospital, discharge time will be determined by your treatment team.   Special instructions:    Please read over the  fact sheets that you were given.

## 2016-03-20 NOTE — Progress Notes (Signed)
Anesthesia chart review: Patient is an 83 year old male scheduled for right carotid endarterectomy on 03/26/2016 by Dr. [name].  History includes former smoker (quit '73), CAD/NSTEMI s/p emergent CABG (LIMA-LAD, SVG-OM, SVG-RCA) 12/17/07, HTN, hypothyroidism, hiatal hernia, murmur (mild AS by 07/2015 echo), left VATS/decortication LLL (Microaerophilic Strep empyema) [id number]. During 12/2015 hospitalization for empyema, a possible pancreatic tail mass versus normal variant was noted on CT, however repeat CT done at [facility]) showed a normal pancreas. With  PCP is listed as Dr. Daphene Calamity. He was seen by Bebe Liter, PA-C on 01/30/16 at [facility].  Cardiologist is Dr. Shelva Majestic. He cleared patient at moderate risk with permission of hold Plavix 5 days prior to surgery (scanned under Media tab).  Meds include amlodipine, aspirin 81 mg, Plavix, Allegra, folic acid, levothyroxine, losartan, Toprol-XL, Niaspan, Crestor. Patient to hold Plavix 5 days prior to surgery.   BP 140/62   Pulse (!) 57   Temp 36.8 C   Resp 20   Ht 5\' 5"  (1.651 m)   Wt 168 lb 14.4 oz (76.6 kg)   SpO2 100%   BMI 28.11 kg/m   EKG 10/30/15: SR with PACs.  Echo 08/20/15: Study Conclusions - Left ventricle: The cavity size was normal. Systolic function was   normal. The estimated ejection fraction was in the range of 60%   to 65%. Although no diagnostic regional wall motion abnormality   was identified, this possibility cannot be completely excluded on   the basis of this study. - Aortic valve: AV is thickened, calcified with mildly restricted   motion Peak and mean gradients through the valve are 25 and 14 mm   Hg respectively - Mitral valve: Calcified annulus. Mildly thickened leaflets .   There was mild regurgitation.  Nuclear stress test 06/08/14: Impression: Low risk pharmacological nuclear perfusion study with normal perfusion pattern and normal regional and global LV  systolic function. EF 59%.  Last cath was prior to his CABG.  Carotid U/S 11/26/15: Impression: Doppler velocities suggest a greater than 80% right proximal ICA stenosis and a 60% left proximal ICA stenosis.  CXR 01/30/16: IMPRESSION: 1. Slight decrease and left posterior and lateral pleural collection. 2. Decreasing airspace disease. 3. Left-sided rib fractures.  CT abdomen w wo contrast 02/05/16 [facility]; Care Everywhere): Impression: 1. The appearance of a mass along the tail the pancreas was caused by accessory splenic tissue. The pancreas is normal. 2. Small residual left empyema with gas in the pleural space and pleural enhancement. Adjacent rib osteotomies of the eighth and ninth ribs. 3. Other imaging findings of potential clinical significance: Small type 1 hiatal hernia. Mitral and aortic valve calcification. Multiple gallstones including stones in the gallbladder neck. Left mid kidney cyst. Small periampullary duodenal diverticulum. Descending colon diverticulosis. Mild bilateral foraminal impingement at L4-5 due to spurring and disc bulge.  Preoperative labs noted.  If no acute changes then I anticipate that he can proceed as planned.  [facility] Short Stay Center/Anesthesiology Phone [phone] 03/20/2016 1:10 PM

## 2016-03-21 DIAGNOSIS — Z85828 Personal history of other malignant neoplasm of skin: Secondary | ICD-10-CM | POA: Diagnosis not present

## 2016-03-21 DIAGNOSIS — L821 Other seborrheic keratosis: Secondary | ICD-10-CM | POA: Diagnosis not present

## 2016-03-21 DIAGNOSIS — D485 Neoplasm of uncertain behavior of skin: Secondary | ICD-10-CM | POA: Diagnosis not present

## 2016-03-21 DIAGNOSIS — D1801 Hemangioma of skin and subcutaneous tissue: Secondary | ICD-10-CM | POA: Diagnosis not present

## 2016-03-21 DIAGNOSIS — L814 Other melanin hyperpigmentation: Secondary | ICD-10-CM | POA: Diagnosis not present

## 2016-03-21 DIAGNOSIS — L570 Actinic keratosis: Secondary | ICD-10-CM | POA: Diagnosis not present

## 2016-03-26 ENCOUNTER — Encounter (HOSPITAL_COMMUNITY)
Admission: RE | Disposition: A | Discharge status: Home / Self Care | Payer: Self-pay | Source: Ambulatory Visit | Attending: Surgery

## 2016-03-26 ENCOUNTER — Inpatient Hospital Stay (HOSPITAL_COMMUNITY)
Admission: RE | Admit: 2016-03-26 | Discharge: 2016-03-27 | DRG: 039 | Disposition: A | Discharge status: Home / Self Care | Payer: Medicare Other | Source: Ambulatory Visit | Attending: Surgery | Admitting: Surgery

## 2016-03-26 ENCOUNTER — Encounter (HOSPITAL_COMMUNITY): Payer: Self-pay | Admitting: *Deleted

## 2016-03-26 ENCOUNTER — Other Ambulatory Visit (HOSPITAL_COMMUNITY): Payer: Self-pay | Admitting: *Deleted

## 2016-03-26 ENCOUNTER — Inpatient Hospital Stay (HOSPITAL_COMMUNITY): Payer: Medicare Other | Admitting: Vascular Surgery

## 2016-03-26 ENCOUNTER — Inpatient Hospital Stay (HOSPITAL_COMMUNITY): Payer: Medicare Other | Admitting: Certified Registered Nurse Anesthetist

## 2016-03-26 DIAGNOSIS — Z951 Presence of aortocoronary bypass graft: Secondary | ICD-10-CM

## 2016-03-26 DIAGNOSIS — Z79899 Other long term (current) drug therapy: Secondary | ICD-10-CM

## 2016-03-26 DIAGNOSIS — I6523 Occlusion and stenosis of bilateral carotid arteries: Principal | ICD-10-CM | POA: Diagnosis present

## 2016-03-26 DIAGNOSIS — I739 Peripheral vascular disease, unspecified: Secondary | ICD-10-CM | POA: Diagnosis present

## 2016-03-26 DIAGNOSIS — I119 Hypertensive heart disease without heart failure: Secondary | ICD-10-CM | POA: Diagnosis present

## 2016-03-26 DIAGNOSIS — E039 Hypothyroidism, unspecified: Secondary | ICD-10-CM | POA: Diagnosis not present

## 2016-03-26 DIAGNOSIS — K449 Diaphragmatic hernia without obstruction or gangrene: Secondary | ICD-10-CM | POA: Diagnosis not present

## 2016-03-26 DIAGNOSIS — I6529 Occlusion and stenosis of unspecified carotid artery: Secondary | ICD-10-CM | POA: Diagnosis present

## 2016-03-26 DIAGNOSIS — E7800 Pure hypercholesterolemia, unspecified: Secondary | ICD-10-CM | POA: Diagnosis present

## 2016-03-26 DIAGNOSIS — Z7902 Long term (current) use of antithrombotics/antiplatelets: Secondary | ICD-10-CM

## 2016-03-26 DIAGNOSIS — Z7982 Long term (current) use of aspirin: Secondary | ICD-10-CM

## 2016-03-26 DIAGNOSIS — R001 Bradycardia, unspecified: Secondary | ICD-10-CM | POA: Diagnosis not present

## 2016-03-26 DIAGNOSIS — Z87891 Personal history of nicotine dependence: Secondary | ICD-10-CM | POA: Diagnosis not present

## 2016-03-26 DIAGNOSIS — I509 Heart failure, unspecified: Secondary | ICD-10-CM | POA: Diagnosis not present

## 2016-03-26 DIAGNOSIS — I2510 Atherosclerotic heart disease of native coronary artery without angina pectoris: Secondary | ICD-10-CM | POA: Diagnosis not present

## 2016-03-26 DIAGNOSIS — E782 Mixed hyperlipidemia: Secondary | ICD-10-CM | POA: Diagnosis not present

## 2016-03-26 DIAGNOSIS — I6521 Occlusion and stenosis of right carotid artery: Secondary | ICD-10-CM | POA: Diagnosis not present

## 2016-03-26 DIAGNOSIS — I10 Essential (primary) hypertension: Secondary | ICD-10-CM | POA: Diagnosis not present

## 2016-03-26 DIAGNOSIS — E785 Hyperlipidemia, unspecified: Secondary | ICD-10-CM | POA: Diagnosis not present

## 2016-03-26 HISTORY — PX: ENDARTERECTOMY: SHX5162

## 2016-03-26 HISTORY — PX: PATCH ANGIOPLASTY: SHX6230

## 2016-03-26 LAB — CBC
HEMATOCRIT: 33.4 % — AB (ref 39.0–52.0)
HEMOGLOBIN: 11.4 g/dL — AB (ref 13.0–17.0)
MCH: 32 pg (ref 26.0–34.0)
MCHC: 34.1 g/dL (ref 30.0–36.0)
MCV: 93.8 fL (ref 78.0–100.0)
Platelets: 184 10*3/uL (ref 150–400)
RBC: 3.56 MIL/uL — AB (ref 4.22–5.81)
RDW: 14.5 % (ref 11.5–15.5)
WBC: 8 10*3/uL (ref 4.0–10.5)

## 2016-03-26 LAB — CREATININE, SERUM
Creatinine, Ser: 0.96 mg/dL (ref 0.61–1.24)
GFR calc Af Amer: >60 mL/min (ref >60)

## 2016-03-26 SURGERY — ENDARTERECTOMY, CAROTID
Anesthesia: General | Site: Neck | Laterality: Right

## 2016-03-26 MED ORDER — PHENOL 1.4 % MT LIQD: 1.0000 | OROMUCOSAL | Status: DC | PRN

## 2016-03-26 MED ORDER — SODIUM CHLORIDE 0.9 % IV SOLN: INTRAVENOUS | Status: DC

## 2016-03-26 MED ORDER — SODIUM CHLORIDE 0.9 % IV SOLN
INTRAVENOUS | Status: DC
  Administered 2016-03-26: 21:00:00 via INTRAVENOUS

## 2016-03-26 MED ORDER — HEPARIN SODIUM (PORCINE) 1000 UNIT/ML IJ SOLN
INTRAMUSCULAR | Status: DC | PRN
  Administered 2016-03-26: 7.5 mL via INTRAVENOUS

## 2016-03-26 MED ORDER — DEXTROSE 5 % IV SOLN
1.5000 g | INTRAVENOUS | Status: AC
  Administered 2016-03-26: 1.5 g via INTRAVENOUS
  Filled 2016-03-26: qty 1.5

## 2016-03-26 MED ORDER — LIDOCAINE HCL (CARDIAC) 20 MG/ML IV SOLN
INTRAVENOUS | Status: DC | PRN
  Administered 2016-03-26: 170 mg via INTRAVENOUS

## 2016-03-26 MED ORDER — BISACODYL 5 MG PO TBEC: 5.0000 mg | DELAYED_RELEASE_TABLET | Freq: Every day | ORAL | Status: DC | PRN

## 2016-03-26 MED ORDER — SUGAMMADEX SODIUM 200 MG/2ML IV SOLN
INTRAVENOUS | Status: DC | PRN
  Administered 2016-03-26: 200 mg via INTRAVENOUS

## 2016-03-26 MED ORDER — HYDRALAZINE HCL 20 MG/ML IJ SOLN: 5.0000 mg | INTRAMUSCULAR | Status: DC | PRN

## 2016-03-26 MED ORDER — SENNOSIDES-DOCUSATE SODIUM 8.6-50 MG PO TABS: 1.0000 | ORAL_TABLET | Freq: Every evening | ORAL | Status: DC | PRN

## 2016-03-26 MED ORDER — ASPIRIN EC 81 MG PO TBEC
81.0000 mg | DELAYED_RELEASE_TABLET | Freq: Every day | ORAL | Status: DC
  Administered 2016-03-26: 81 mg via ORAL
  Filled 2016-03-26: qty 1

## 2016-03-26 MED ORDER — OXYCODONE-ACETAMINOPHEN 5-325 MG PO TABS: 1.0000 | ORAL_TABLET | ORAL | Status: DC | PRN

## 2016-03-26 MED ORDER — ONDANSETRON HCL 4 MG/2ML IJ SOLN: 4.0000 mg | Freq: Four times a day (QID) | INTRAMUSCULAR | Status: DC | PRN

## 2016-03-26 MED ORDER — SODIUM CHLORIDE 0.9 % IV SOLN
500.0000 mL | Freq: Once | INTRAVENOUS | Status: AC | PRN
  Administered 2016-03-27: 500 mL via INTRAVENOUS

## 2016-03-26 MED ORDER — 0.9 % SODIUM CHLORIDE (POUR BTL) OPTIME
TOPICAL | Status: DC | PRN
  Administered 2016-03-26: 2500 mL

## 2016-03-26 MED ORDER — ACETAMINOPHEN 325 MG PO TABS
ORAL_TABLET | ORAL | Status: AC
  Filled 2016-03-26: qty 2

## 2016-03-26 MED ORDER — CLOPIDOGREL BISULFATE 75 MG PO TABS
75.0000 mg | ORAL_TABLET | Freq: Every day | ORAL | Status: DC
  Administered 2016-03-27: 75 mg via ORAL
  Filled 2016-03-26: qty 1

## 2016-03-26 MED ORDER — LOSARTAN POTASSIUM 50 MG PO TABS
50.0000 mg | ORAL_TABLET | Freq: Every day | ORAL | Status: DC
  Filled 2016-03-26 (×2): qty 1

## 2016-03-26 MED ORDER — GUAIFENESIN-DM 100-10 MG/5ML PO SYRP: 15.0000 mL | ORAL_SOLUTION | ORAL | Status: DC | PRN

## 2016-03-26 MED ORDER — PHENYLEPHRINE HCL 10 MG/ML IJ SOLN
INTRAVENOUS | Status: DC | PRN
  Administered 2016-03-26: 40 ug/min via INTRAVENOUS

## 2016-03-26 MED ORDER — LABETALOL HCL 5 MG/ML IV SOLN
10.0000 mg | INTRAVENOUS | Status: DC | PRN
Start: 2016-03-26 — End: 2016-03-27

## 2016-03-26 MED ORDER — GLYCOPYRROLATE 0.2 MG/ML IV SOSY
PREFILLED_SYRINGE | INTRAVENOUS | Status: DC | PRN
  Administered 2016-03-26: .2 mg via INTRAVENOUS

## 2016-03-26 MED ORDER — FENTANYL CITRATE (PF) 100 MCG/2ML IJ SOLN
INTRAMUSCULAR | Status: DC | PRN
  Administered 2016-03-26: 50 ug via INTRAVENOUS
  Administered 2016-03-26: 100 ug via INTRAVENOUS

## 2016-03-26 MED ORDER — ONDANSETRON HCL 4 MG/2ML IJ SOLN
INTRAMUSCULAR | Status: AC
  Filled 2016-03-26: qty 2

## 2016-03-26 MED ORDER — ROSUVASTATIN CALCIUM 40 MG PO TABS
40.0000 mg | ORAL_TABLET | Freq: Every day | ORAL | Status: DC
  Administered 2016-03-26: 40 mg via ORAL
  Filled 2016-03-26: qty 1

## 2016-03-26 MED ORDER — PROPOFOL 10 MG/ML IV BOLUS
INTRAVENOUS | Status: AC
  Filled 2016-03-26: qty 20

## 2016-03-26 MED ORDER — EPHEDRINE 5 MG/ML INJ
INTRAVENOUS | Status: AC
  Filled 2016-03-26: qty 10

## 2016-03-26 MED ORDER — DEXTROSE 5 % IV SOLN
1.5000 g | Freq: Two times a day (BID) | INTRAVENOUS | Status: DC
  Administered 2016-03-26: 1.5 g via INTRAVENOUS
  Filled 2016-03-26 (×2): qty 1.5

## 2016-03-26 MED ORDER — FENTANYL CITRATE (PF) 100 MCG/2ML IJ SOLN: 25.0000 ug | INTRAMUSCULAR | Status: DC | PRN

## 2016-03-26 MED ORDER — HEMOSTATIC AGENTS (NO CHARGE) OPTIME
TOPICAL | Status: DC | PRN
  Administered 2016-03-26: 1 via TOPICAL

## 2016-03-26 MED ORDER — AMLODIPINE BESYLATE 5 MG PO TABS
5.0000 mg | ORAL_TABLET | Freq: Every day | ORAL | Status: DC
  Administered 2016-03-26: 5 mg via ORAL
  Filled 2016-03-26: qty 1

## 2016-03-26 MED ORDER — SODIUM CHLORIDE 0.9 % IV SOLN
INTRAVENOUS | Status: DC | PRN
  Administered 2016-03-26: 12:00:00 500 mL

## 2016-03-26 MED ORDER — ONDANSETRON HCL 4 MG/2ML IJ SOLN
INTRAMUSCULAR | Status: DC | PRN
  Administered 2016-03-26: 4 mg via INTRAVENOUS

## 2016-03-26 MED ORDER — MORPHINE SULFATE (PF) 2 MG/ML IV SOLN: 2.0000 mg | INTRAVENOUS | Status: DC | PRN

## 2016-03-26 MED ORDER — ENOXAPARIN SODIUM 40 MG/0.4ML ~~LOC~~ SOLN: 40.0000 mg | SUBCUTANEOUS | Status: DC

## 2016-03-26 MED ORDER — FENTANYL CITRATE (PF) 250 MCG/5ML IJ SOLN
INTRAMUSCULAR | Status: AC
  Filled 2016-03-26: qty 5

## 2016-03-26 MED ORDER — MAGNESIUM SULFATE 2 GM/50ML IV SOLN: 2.0000 g | Freq: Every day | INTRAVENOUS | Status: DC | PRN

## 2016-03-26 MED ORDER — ACETAMINOPHEN 325 MG PO TABS
325.0000 mg | ORAL_TABLET | ORAL | Status: DC | PRN
  Administered 2016-03-26 – 2016-03-27 (×2): 650 mg via ORAL
  Filled 2016-03-26: qty 2

## 2016-03-26 MED ORDER — METOPROLOL TARTRATE 5 MG/5ML IV SOLN: 2.0000 mg | INTRAVENOUS | Status: DC | PRN

## 2016-03-26 MED ORDER — LEVOTHYROXINE SODIUM 112 MCG PO TABS
112.0000 ug | ORAL_TABLET | Freq: Every day | ORAL | Status: DC
  Administered 2016-03-27: 112 ug via ORAL
  Filled 2016-03-26: qty 1

## 2016-03-26 MED ORDER — DOCUSATE SODIUM 100 MG PO CAPS
100.0000 mg | ORAL_CAPSULE | Freq: Every day | ORAL | Status: DC
  Filled 2016-03-26: qty 1

## 2016-03-26 MED ORDER — METOPROLOL SUCCINATE ER 50 MG PO TB24
50.0000 mg | ORAL_TABLET | Freq: Every day | ORAL | Status: DC
  Filled 2016-03-26: qty 1

## 2016-03-26 MED ORDER — POTASSIUM CHLORIDE CRYS ER 20 MEQ PO TBCR: 20.0000 meq | EXTENDED_RELEASE_TABLET | Freq: Every day | ORAL | Status: DC | PRN

## 2016-03-26 MED ORDER — ACETAMINOPHEN 325 MG RE SUPP
325.0000 mg | RECTAL | Status: DC | PRN
  Filled 2016-03-26: qty 2

## 2016-03-26 MED ORDER — PANTOPRAZOLE SODIUM 40 MG PO TBEC
40.0000 mg | DELAYED_RELEASE_TABLET | Freq: Every day | ORAL | Status: DC
  Administered 2016-03-26: 40 mg via ORAL
  Filled 2016-03-26 (×2): qty 1

## 2016-03-26 MED ORDER — ROCURONIUM BROMIDE 100 MG/10ML IV SOLN
INTRAVENOUS | Status: DC | PRN
  Administered 2016-03-26: 10 mg via INTRAVENOUS
  Administered 2016-03-26: 50 mg via INTRAVENOUS

## 2016-03-26 MED ORDER — LOSARTAN POTASSIUM 50 MG PO TABS: 50.0000 mg | ORAL_TABLET | Freq: Every day | ORAL | Status: DC

## 2016-03-26 MED ORDER — LIDOCAINE HCL 4 % MT SOLN
OROMUCOSAL | Status: DC | PRN
  Administered 2016-03-26: 100 mL via TOPICAL

## 2016-03-26 MED ORDER — LORATADINE 10 MG PO TABS
10.0000 mg | ORAL_TABLET | Freq: Every day | ORAL | Status: DC
  Administered 2016-03-27: 10 mg via ORAL
  Filled 2016-03-26: qty 1

## 2016-03-26 MED ORDER — LIDOCAINE HCL (PF) 1 % IJ SOLN
INTRAMUSCULAR | Status: AC
  Filled 2016-03-26: qty 30

## 2016-03-26 MED ORDER — LACTATED RINGERS IV SOLN
INTRAVENOUS | Status: DC
  Administered 2016-03-26 (×2): via INTRAVENOUS

## 2016-03-26 MED ORDER — ALUM & MAG HYDROXIDE-SIMETH 200-200-20 MG/5ML PO SUSP: 15.0000 mL | ORAL | Status: DC | PRN

## 2016-03-26 MED ORDER — PROTAMINE SULFATE 10 MG/ML IV SOLN
INTRAVENOUS | Status: DC | PRN
  Administered 2016-03-26 (×3): 10 mg via INTRAVENOUS

## 2016-03-26 MED ORDER — SUCCINYLCHOLINE CHLORIDE 200 MG/10ML IV SOSY
PREFILLED_SYRINGE | INTRAVENOUS | Status: AC
  Filled 2016-03-26: qty 10

## 2016-03-26 SURGICAL SUPPLY — 46 items
CANISTER SUCT 3000ML PPV (MISCELLANEOUS) ×3 IMPLANT
CATH ROBINSON RED A/P 18FR (CATHETERS) ×3 IMPLANT
CATH SUCT 10FR WHISTLE TIP (CATHETERS) ×3 IMPLANT
CLIP TI MEDIUM 6 (CLIP) ×3 IMPLANT
CLIP TI WIDE RED SMALL 6 (CLIP) ×3 IMPLANT
CRADLE DONUT ADULT HEAD (MISCELLANEOUS) ×3 IMPLANT
DERMABOND ADVANCED (GAUZE/BANDAGES/DRESSINGS) ×2
DERMABOND ADVANCED .7 DNX12 (GAUZE/BANDAGES/DRESSINGS) ×1 IMPLANT
DRAIN CHANNEL 15F RND FF W/TCR (WOUND CARE) IMPLANT
ELECT REM PT RETURN 9FT ADLT (ELECTROSURGICAL) ×3
ELECTRODE REM PT RTRN 9FT ADLT (ELECTROSURGICAL) ×1 IMPLANT
EVACUATOR SILICONE 100CC (DRAIN) IMPLANT
GLOVE BIO SURGEON STRL SZ 6.5 (GLOVE) ×4 IMPLANT
GLOVE BIO SURGEON STRL SZ7 (GLOVE) ×3 IMPLANT
GLOVE BIO SURGEONS STRL SZ 6.5 (GLOVE) ×2
GLOVE BIOGEL PI IND STRL 7.5 (GLOVE) ×1 IMPLANT
GLOVE BIOGEL PI INDICATOR 7.5 (GLOVE) ×2
GLOVE SURG SS PI 7.0 STRL IVOR (GLOVE) ×6 IMPLANT
GLOVE SURG SS PI 7.5 STRL IVOR (GLOVE) ×3 IMPLANT
GOWN STRL REUS W/ TWL LRG LVL3 (GOWN DISPOSABLE) ×3 IMPLANT
GOWN STRL REUS W/ TWL XL LVL3 (GOWN DISPOSABLE) ×1 IMPLANT
GOWN STRL REUS W/TWL LRG LVL3 (GOWN DISPOSABLE) ×6
GOWN STRL REUS W/TWL XL LVL3 (GOWN DISPOSABLE) ×2
HEMOSTAT SNOW SURGICEL 2X4 (HEMOSTASIS) ×3 IMPLANT
INSERT FOGARTY SM (MISCELLANEOUS) IMPLANT
KIT BASIN OR (CUSTOM PROCEDURE TRAY) ×3 IMPLANT
KIT ROOM TURNOVER OR (KITS) ×3 IMPLANT
NEEDLE HYPO 25GX1X1/2 BEV (NEEDLE) ×3 IMPLANT
NS IRRIG 1000ML POUR BTL (IV SOLUTION) ×9 IMPLANT
PACK CAROTID (CUSTOM PROCEDURE TRAY) ×3 IMPLANT
PAD ARMBOARD 7.5X6 YLW CONV (MISCELLANEOUS) ×6 IMPLANT
PATCH VASC XENOSURE 1CMX6CM (Vascular Products) ×2 IMPLANT
PATCH VASC XENOSURE 1X6 (Vascular Products) ×1 IMPLANT
SHUNT CAROTID BYPASS 10 (VASCULAR PRODUCTS) IMPLANT
SHUNT CAROTID BYPASS 12FRX15.5 (VASCULAR PRODUCTS) IMPLANT
SUT ETHILON 3 0 PS 1 (SUTURE) IMPLANT
SUT PROLENE 6 0 BV (SUTURE) ×6 IMPLANT
SUT PROLENE 7 0 BV 1 (SUTURE) IMPLANT
SUT PROLENE 7 0 BV1 MDA (SUTURE) ×3 IMPLANT
SUT SILK 3 0 (SUTURE)
SUT SILK 3-0 18XBRD TIE 12 (SUTURE) IMPLANT
SUT VIC AB 3-0 SH 27 (SUTURE) ×4
SUT VIC AB 3-0 SH 27X BRD (SUTURE) ×2 IMPLANT
SUT VICRYL 4-0 PS2 18IN ABS (SUTURE) ×3 IMPLANT
SYR CONTROL 10ML LL (SYRINGE) ×3 IMPLANT
WATER STERILE IRR 1000ML POUR (IV SOLUTION) ×3 IMPLANT

## 2016-03-26 NOTE — Anesthesia Postprocedure Evaluation (Signed)
Anesthesia Post Note  Patient: Dillon [name]  Procedure(s) Performed: Procedure(s) (LRB): RIGHT CAROTID ENDARTERECTOMY (Right) PATCH ANGIOPLASTY USING XENOSURE BIOLOGIC PATCH (Right)  Patient location during evaluation: PACU Anesthesia Type: General Level of consciousness: awake Pain management: pain level controlled Vital Signs Assessment: post-procedure vital signs reviewed and stable Respiratory status: spontaneous breathing Cardiovascular status: stable Anesthetic complications: no       Last Vitals:  Vitals:   03/26/16 1640 03/26/16 1655  BP: 133/60 133/63  Pulse: (!) 56 (!) 57  Resp: 16 12  Temp:      Last Pain:  Vitals:   03/26/16 1658  TempSrc:   PainSc: 3                  Iziah Cates

## 2016-03-26 NOTE — Progress Notes (Signed)
POST OP R CEA  Not complaining of any pain Incision looks good Neurologically intact  OK to transfer to 4E   Dillon [name]

## 2016-03-26 NOTE — Transfer of Care (Signed)
Immediate Anesthesia Transfer of Care Note  Patient: Dillon [name]  Procedure(s) Performed: Procedure(s): RIGHT CAROTID ENDARTERECTOMY (Right) PATCH ANGIOPLASTY USING XENOSURE BIOLOGIC PATCH (Right)  Patient Location: PACU  Anesthesia Type:General  Level of Consciousness: awake, alert , oriented and patient cooperative  Airway & Oxygen Therapy: Patient Spontanous Breathing and Patient connected to nasal cannula oxygen  Post-op Assessment: Report given to RN, Post -op Vital signs reviewed and stable, Patient moving all extremities X 4 and Patient able to stick tongue midline  Post vital signs: Reviewed and stable  Last Vitals:  Vitals:   03/26/16 0830 03/26/16 1510  BP:  125/60  Pulse: 66 69  Resp:  16  Temp:      Last Pain:  Vitals:   03/26/16 0818  TempSrc: Oral      Patients Stated Pain Goal: 4 ([id number])  Complications: No apparent anesthesia complications

## 2016-03-26 NOTE — Op Note (Signed)
Patient name: Dillon [name] MRN: [id number] DOB: 07-Nov-1933 Sex: male  03/26/2016 Pre-operative Diagnosis: Asymptomatic    right carotid stenosis Post-operative diagnosis:  Same Surgeon:  [name] Assistants:  S. Rhyne Procedure:    right carotid Endarterectomy with bovine pericardial  patch angioplasty   #2:  Resection with primary anastamosis of right internal carotid artery  Anesthesia:  General Blood Loss:  See anesthesia record Specimens:  Carotid Plaque to pathology  Findings:  95 %stenosis; Thrombus:  none  Indications:  The patient has developed progressive bilateral asymptomatic carotid stenosis, right greater than left.  The right side is now greater than 80%.  He comes in today for revascularization.  Procedure:  The patient was identified in the holding area and taken to [facility]  The patient was then placed supine on the table.   General endotrachial anesthesia was administered.  The patient was prepped and draped in the usual sterile fashion.  A time out was called and antibiotics were administered.  The incision was made along the anterior border of the right sternocleidomastoid muscle.  Cautery was used to dissect through the subcutaneous tissue.  The platysma muscle was divided with cautery.  The internal jugular vein was exposed along its anterior medial border.  The common facial vein was exposed and then divided between 2-0 silk ties and metal clips.  The common carotid artery was then circumferentially exposed and encircled with an umbilical tape.  The vagus nerve was identified and protected.  Next sharp dissection was used to expose the external carotid artery and the superior thyroid artery.  The were encircled with a blue vessel loop and a 2-0 silk tie respectively.  Finally, the internal carotid was carefully dissected free.  An umbilical tape was placed around the internal carotid artery distal to the diseased segment.  The hypoglossal nerve was visualized  throughout and protected.  The patient was given systemic heparinization.  A bovine carotid patch was selected and prepared on the back table.  A 10 french shunt was also prepared.  After blood pressure readings were appropriate and the heparin had been given time to circulate, the internal carotid artery was occluded with a baby Gregory clamp.  The external and common carotid arteries were then occluded with vascular clamps and the 2-0 tie tightened on the superior thyroid artery.  A #11 blade was used to make an arteriotomy in the common carotid artery.  This was extended with Potts scissors along the anterior and lateral border of the common and internal carotid artery.  Approximately 95% stenosis was identified.  There was no thrombus identified.  The 10 french shunt was not placed because there was excellent backbleeding.  A kleiner kuntz elevator was used to perform endarterectomy.  An eversion endarterectomy was performed in the external carotid artery.  A good distal endpoint was obtained in the internal carotid artery.  A significant redundancy within the internal carotid artery and therefore I resected approximately 2 cm and perform primary anastomosis with 6-0 Prolene.  The specimen was removed and sent to pathology.  Heparinized saline was used to irrigate the endarterectomized field.  All potential embolic debris was removed.  Bovine pericardial patch angioplasty was then performed using a running 6-0 Prolene.  The common internal and external carotid arteries were all appropriately flushed. The artery was again irrigated with heparin saline.  The anastomosis was then secured. The clamp was first released on the external carotid artery followed by the common carotid artery  approximately 30 seconds later, bloodflow was reestablish through the internal carotid artery.  Next, a hand-held  Doppler was used to evaluate the signals in the common, external, and internal  carotid arteries, all of which had  appropriate signals. I then administered  50 mg protamine. The wound was then irrigated.  After hemostasis was achieved, the carotid sheath was reapproximated with 3-0 Vicryl. The  platysma muscle was reapproximated with running 3-0 Vicryl. The skin  was closed with 4-0 Vicryl. Dermabond was placed on the skin. The  patient was then successfully extubated. His neurologic exam was  similar to his preprocedural exam. The patient was then taken to recovery room  in stable condition. There were no complications.     Disposition:  To PACU in stable condition.  Relevant Operative Details:  Nearly occluded right internal carotid artery.  This was a heavily calcified lesion.  I did not use a shunt because there was excellent backbleeding.  There was significant redundancy within the internal carotid artery after mobilization and exposure.  Therefore I resected approximately 2 cm of the internal carotid artery and performed a primary anastomosis of the back wall and then used a very fine pericardial patch for the standard repair.  Theotis Burrow, M.D. [facility] [place] Office: [phone] Pager:  [phone]

## 2016-03-26 NOTE — Anesthesia Preprocedure Evaluation (Addendum)
Anesthesia Evaluation  Patient identified by MRN, date of birth, ID band Patient awake    Reviewed: Allergy & Precautions, NPO status , Patient's Chart, lab work & pertinent test results, reviewed documented beta blocker date and time   Airway Mallampati: II  TM Distance: >3 FB     Dental  (+) Teeth Intact, Dental Advisory Given   Pulmonary pneumonia, former smoker,    breath sounds clear to auscultation       Cardiovascular hypertension, Pt. on medications and Pt. on home beta blockers + CAD and + Peripheral Vascular Disease  + Valvular Problems/Murmurs  Rhythm:Regular Rate:Normal     Neuro/Psych    GI/Hepatic hiatal hernia,   Endo/Other  Hypothyroidism   Renal/GU Renal disease     Musculoskeletal   Abdominal   Peds  Hematology   Anesthesia Other Findings   Reproductive/Obstetrics                            Anesthesia Physical Anesthesia Plan  ASA: III  Anesthesia Plan: General   Post-op Pain Management:    Induction: Intravenous  Airway Management Planned: Oral ETT  Additional Equipment: Arterial line  Intra-op Plan:   Post-operative Plan: Extubation in OR  Informed Consent: I have reviewed the patients History and Physical, chart, labs and discussed the procedure including the risks, benefits and alternatives for the proposed anesthesia with the patient or authorized representative who has indicated his/her understanding and acceptance.   Dental advisory given  Plan Discussed with: CRNA, Anesthesiologist and Surgeon  Anesthesia Plan Comments:         Anesthesia Quick Evaluation

## 2016-03-26 NOTE — Anesthesia Preprocedure Evaluation (Signed)
Anesthesia Evaluation  Patient identified by MRN, date of birth, ID band Patient awake    Reviewed: Allergy & Precautions, NPO status , Patient's Chart, lab work & pertinent test results  Airway Mallampati: II  TM Distance: >3 FB     Dental   Pulmonary pneumonia, former smoker,           Cardiovascular hypertension, + CAD and + Peripheral Vascular Disease  + Valvular Problems/Murmurs  Rhythm:Regular Rate:Normal     Neuro/Psych negative neurological ROS     GI/Hepatic Neg liver ROS, hiatal hernia,   Endo/Other  Hypothyroidism   Renal/GU Renal disease     Musculoskeletal   Abdominal   Peds  Hematology   Anesthesia Other Findings   Reproductive/Obstetrics                             Anesthesia Physical Anesthesia Plan  ASA: III  Anesthesia Plan: General   Post-op Pain Management:    Induction: Intravenous  Airway Management Planned: Oral ETT  Additional Equipment: Arterial line  Intra-op Plan:   Post-operative Plan: Possible Post-op intubation/ventilation  Informed Consent:   Dental advisory given  Plan Discussed with: CRNA, Anesthesiologist and Surgeon  Anesthesia Plan Comments:         Anesthesia Quick Evaluation

## 2016-03-26 NOTE — Progress Notes (Signed)
[facility]  Patient name: Dillon [name]    MRN: [id number]        DOB: 1933-05-16            Sex: male  REFERRING PHYSICIAN: Dr. Claiborne Billings  REASON FOR CONSULT: carotid stenosis  HPI: Lonne Vandemark is a 83 y.o. male, who is referred today for evaluation of bilateral carotid stenosis, right greater than left.  The patient is asymptomatic.  Specifically, he denies numbness or weakness in either extremity.  He denies slurred speech.  He denies amaurosis fugax.  Patient has a history of three-vessel CABG by Dr. Cyndia Bent.  He is medically managed for hypercholesterolemia with a statin.  He is a former smoker.  He is medically managed for hypertension with an ARB      Past Medical History:  Diagnosis Date  . CAD (coronary artery disease)   . S/P CABG x 3 12/17/07   LIMA to LAD,SVG to left C    No family history on file.  SOCIAL HISTORY: Social History        Social History  . Marital status: Married    Spouse name: N/A  . Number of children: N/A  . Years of education: N/A      Occupational History  . Not on file.         Social History Main Topics  . Smoking status: Former Smoker    Quit date: 01/28/1971  . Smokeless tobacco: Never Used  . Alcohol use Yes     Comment: socially  . Drug use: No  . Sexual activity: Not on file       Other Topics Concern  . Not on file      Social History Narrative  . No narrative on file    No Known Allergies        Current Outpatient Prescriptions  Medication Sig Dispense Refill  . amLODipine (NORVASC) 5 MG tablet Take 1 tablet (5 mg total) by mouth daily. 90 tablet 3  . aspirin EC 81 MG tablet Take 81 mg by mouth daily.    . cholecalciferol (VITAMIN D) 1000 UNITS tablet Take 2,000 Units by mouth daily.    . clopidogrel (PLAVIX) 75 MG tablet Take 1 tablet (75 mg total) by mouth daily. 90 tablet 3  . fexofenadine (ALLEGRA) 180 MG tablet Take 180 mg by mouth  daily.    . folic acid (FOLVITE) 1 MG tablet TAKE 1 TABLET EVERY DAY 90 tablet 1  . levothyroxine (SYNTHROID, LEVOTHROID) 112 MCG tablet Take 112 mcg by mouth daily.    Marland Kitchen losartan (COZAAR) 100 MG tablet Take 1 tablet (100 mg total) by mouth daily. 90 tablet 3  . metoprolol succinate (TOPROL-XL) 50 MG 24 hr tablet TAKE 1 TABLET (50 MG TOTAL) BY MOUTH DAILY. 90 tablet 3  . mupirocin ointment (BACTROBAN) 2 % Apply 1 application topically daily.    . niacin (NIASPAN) 1000 MG CR tablet TAKE 1 TABLET AT BEDTIME 90 tablet 1  . rosuvastatin (CRESTOR) 40 MG tablet Take 1 tablet (40 mg total) by mouth daily. 90 tablet 3   No current facility-administered medications for this visit.     REVIEW OF SYSTEMS:  [X]  denotes positive finding, [ ]  denotes negative finding Cardiac  Comments:  Chest pain or chest pressure:    Shortness of breath upon exertion:    Short of breath when lying flat:    Irregular heart rhythm:        Vascular  Pain in calf, thigh, or hip brought on by ambulation:    Pain in feet at night that wakes you up from your sleep:     Blood clot in your veins:    Leg swelling:         Pulmonary    Oxygen at home:    Productive cough:     Wheezing:         Neurologic    Sudden weakness in arms or legs:     Sudden numbness in arms or legs:     Sudden onset of difficulty speaking or slurred speech:    Temporary loss of vision in one eye:     Problems with dizziness:         Gastrointestinal    Blood in stool:     Vomited blood:         Genitourinary    Burning when urinating:     Blood in urine:        Psychiatric    Major depression:         Hematologic    Bleeding problems:    Problems with blood clotting too easily:        Skin    Rashes or ulcers:        Constitutional    Fever or chills:      PHYSICAL EXAM:     Vitals:   11/26/15 1426 11/26/15  1431  BP: (!) 156/68 140/66  Pulse: (!) 58 (!) 58  Resp: (!) 22   Temp: 98.4 F (36.9 C)   TempSrc: Oral   SpO2: 96%   Weight: 170 lb (77.1 kg)   Height: 5\' 5"  (1.651 m)     GENERAL: The patient is a well-nourished male, in no acute distress. The vital signs are documented above. CARDIAC: There is a regular rate and rhythm.  VASCULAR: Regular rhythm with systolic ejection murmur.  Faint right carotid bruit PULMONARY: There is good air exchange bilaterally without wheezing or rales. ABDOMEN: Soft and non-tender with normal pitched bowel sounds.  No pulsatile mass MUSCULOSKELETAL: There are no major deformities or cyanosis. NEUROLOGIC: No focal weakness or paresthesias are detected. SKIN: There are no ulcers or rashes noted. PSYCHIATRIC: The patient has a normal affect.  DATA:  I have reviewed his ultrasound studies.  This shows greater than 80% right carotid stenosis and 60% left carotid stenosis.  The bifurcation is in the mid neck.  ASSESSMENT AND PLAN: Asymptomatic right carotid stenosis: We discussed our treatment options and have elected to proceed with right carotid endarterectomy.  The risks and benefits of the operation were discussed with the patient including the risk of stroke, nerve injury, and numbness along the right side of the face.  All of his questions were answered.  I would like to stop his Plavix 5 days prior to his operation.  The patient would like to wait until after the holidays to have the surgery done.  Therefore he will contact me with a date in early January.  If it gets to March and we still haven't done his surgery, he would need a repeat ultrasound as well as a clinic visit.  We discussed the signs and symptoms of stroke and what to do should they occur.   [name], MD [facility] [phone] Pager [phone]  No changes from clinic visit.  Remains neurologically intact CV:RRR Pulm: CTA Abd  soft Plan right carotid endarterectomy with patch angioplasty.  All questions answered.   [name]

## 2016-03-26 NOTE — Anesthesia Procedure Notes (Signed)
Procedure Name: Intubation Date/Time: 03/26/2016 1:04 PM Performed by: Carney Living Pre-anesthesia Checklist: Patient identified, Emergency Drugs available, Suction available, Patient being monitored and Timeout performed Patient Re-evaluated:Patient Re-evaluated prior to inductionOxygen Delivery Method: Circle system utilized Preoxygenation: Pre-oxygenation with 100% oxygen Intubation Type: IV induction Ventilation: Mask ventilation without difficulty Laryngoscope Size: Mac and 4 Grade View: Grade I Tube type: Oral Tube size: 7.0 mm Number of attempts: 1 Airway Equipment and Method: Stylet Placement Confirmation: ETT inserted through vocal cords under direct vision and positive ETCO2 Secured at: 21 cm Tube secured with: Tape Dental Injury: Teeth and Oropharynx as per pre-operative assessment

## 2016-03-26 NOTE — Progress Notes (Signed)
Patient with left sided facial droop on arrival to PACU.  Skip Mayer, PA at bedside and aware.

## 2016-03-27 ENCOUNTER — Telehealth: Payer: Self-pay | Admitting: Surgery

## 2016-03-27 ENCOUNTER — Encounter (HOSPITAL_COMMUNITY): Payer: Self-pay | Admitting: Surgery

## 2016-03-27 LAB — BASIC METABOLIC PANEL
ANION GAP: 5 (ref 5–15)
BUN: 16 mg/dL (ref 6–20)
CALCIUM: 8.3 mg/dL — AB (ref 8.9–10.3)
CO2: 24 mmol/L (ref 22–32)
CREATININE: 1.08 mg/dL (ref 0.61–1.24)
Chloride: 108 mmol/L (ref 101–111)
Glucose, Bld: 99 mg/dL (ref 65–99)
Potassium: 4 mmol/L (ref 3.5–5.1)
SODIUM: 137 mmol/L (ref 135–145)

## 2016-03-27 LAB — CBC
HEMATOCRIT: 31.5 % — AB (ref 39.0–52.0)
Hemoglobin: 10.5 g/dL — ABNORMAL LOW (ref 13.0–17.0)
MCH: 31.5 pg (ref 26.0–34.0)
MCHC: 33.3 g/dL (ref 30.0–36.0)
MCV: 94.6 fL (ref 78.0–100.0)
PLATELETS: 175 10*3/uL (ref 150–400)
RBC: 3.33 MIL/uL — ABNORMAL LOW (ref 4.22–5.81)
RDW: 14.8 % (ref 11.5–15.5)
WBC: 7.3 10*3/uL (ref 4.0–10.5)

## 2016-03-27 NOTE — Telephone Encounter (Signed)
-----   Message from Mena Goes, RN sent at 03/26/2016  4:59 PM EST ----- Regarding: 2 weeks    ----- Message ----- From: Ulyses Amor, PA-C Sent: 03/26/2016   3:08 PM To: Vvs Charge Pool  F/U with Sr. Brabham in 2 weeks s/p CEA right

## 2016-03-27 NOTE — H&P (View-Only) (Signed)
[facility]  Patient name: Dillon [name]    MRN: [id number]        DOB: 12-09-1933            Sex: male  REFERRING PHYSICIAN: Dr. Claiborne Billings  REASON FOR CONSULT: carotid stenosis  HPI: Dillon [name] is a 83 y.o. male, who is referred today for evaluation of bilateral carotid stenosis, right greater than left.  The patient is asymptomatic.  Specifically, he denies numbness or weakness in either extremity.  He denies slurred speech.  He denies amaurosis fugax.  Patient has a history of three-vessel CABG by Dr. Cyndia Bent.  He is medically managed for hypercholesterolemia with a statin.  He is a former smoker.  He is medically managed for hypertension with an ARB      Past Medical History:  Diagnosis Date  . CAD (coronary artery disease)   . S/P CABG x 3 12/17/07   LIMA to LAD,SVG to left C    No family history on file.  SOCIAL HISTORY: Social History        Social History  . Marital status: Married    Spouse name: N/A  . Number of children: N/A  . Years of education: N/A      Occupational History  . Not on file.         Social History Main Topics  . Smoking status: Former Smoker    Quit date: 01/28/1971  . Smokeless tobacco: Never Used  . Alcohol use Yes     Comment: socially  . Drug use: No  . Sexual activity: Not on file       Other Topics Concern  . Not on file      Social History Narrative  . No narrative on file    No Known Allergies        Current Outpatient Prescriptions  Medication Sig Dispense Refill  . amLODipine (NORVASC) 5 MG tablet Take 1 tablet (5 mg total) by mouth daily. 90 tablet 3  . aspirin EC 81 MG tablet Take 81 mg by mouth daily.    . cholecalciferol (VITAMIN D) 1000 UNITS tablet Take 2,000 Units by mouth daily.    . clopidogrel (PLAVIX) 75 MG tablet Take 1 tablet (75 mg total) by mouth daily. 90 tablet 3  . fexofenadine (ALLEGRA) 180 MG tablet Take 180 mg by mouth  daily.    . folic acid (FOLVITE) 1 MG tablet TAKE 1 TABLET EVERY DAY 90 tablet 1  . levothyroxine (SYNTHROID, LEVOTHROID) 112 MCG tablet Take 112 mcg by mouth daily.    Marland Kitchen losartan (COZAAR) 100 MG tablet Take 1 tablet (100 mg total) by mouth daily. 90 tablet 3  . metoprolol succinate (TOPROL-XL) 50 MG 24 hr tablet TAKE 1 TABLET (50 MG TOTAL) BY MOUTH DAILY. 90 tablet 3  . mupirocin ointment (BACTROBAN) 2 % Apply 1 application topically daily.    . niacin (NIASPAN) 1000 MG CR tablet TAKE 1 TABLET AT BEDTIME 90 tablet 1  . rosuvastatin (CRESTOR) 40 MG tablet Take 1 tablet (40 mg total) by mouth daily. 90 tablet 3   No current facility-administered medications for this visit.     REVIEW OF SYSTEMS:  [X]  denotes positive finding, [ ]  denotes negative finding Cardiac  Comments:  Chest pain or chest pressure:    Shortness of breath upon exertion:    Short of breath when lying flat:    Irregular heart rhythm:        Vascular  Pain in calf, thigh, or hip brought on by ambulation:    Pain in feet at night that wakes you up from your sleep:     Blood clot in your veins:    Leg swelling:         Pulmonary    Oxygen at home:    Productive cough:     Wheezing:         Neurologic    Sudden weakness in arms or legs:     Sudden numbness in arms or legs:     Sudden onset of difficulty speaking or slurred speech:    Temporary loss of vision in one eye:     Problems with dizziness:         Gastrointestinal    Blood in stool:     Vomited blood:         Genitourinary    Burning when urinating:     Blood in urine:        Psychiatric    Major depression:         Hematologic    Bleeding problems:    Problems with blood clotting too easily:        Skin    Rashes or ulcers:        Constitutional    Fever or chills:      PHYSICAL EXAM:     Vitals:   11/26/15 1426 11/26/15  1431  BP: (!) 156/68 140/66  Pulse: (!) 58 (!) 58  Resp: (!) 22   Temp: 98.4 F (36.9 C)   TempSrc: Oral   SpO2: 96%   Weight: 170 lb (77.1 kg)   Height: 5\' 5"  (1.651 m)     GENERAL: The patient is a well-nourished male, in no acute distress. The vital signs are documented above. CARDIAC: There is a regular rate and rhythm.  VASCULAR: Regular rhythm with systolic ejection murmur.  Faint right carotid bruit PULMONARY: There is good air exchange bilaterally without wheezing or rales. ABDOMEN: Soft and non-tender with normal pitched bowel sounds.  No pulsatile mass MUSCULOSKELETAL: There are no major deformities or cyanosis. NEUROLOGIC: No focal weakness or paresthesias are detected. SKIN: There are no ulcers or rashes noted. PSYCHIATRIC: The patient has a normal affect.  DATA:  I have reviewed his ultrasound studies.  This shows greater than 80% right carotid stenosis and 60% left carotid stenosis.  The bifurcation is in the mid neck.  ASSESSMENT AND PLAN: Asymptomatic right carotid stenosis: We discussed our treatment options and have elected to proceed with right carotid endarterectomy.  The risks and benefits of the operation were discussed with the patient including the risk of stroke, nerve injury, and numbness along the right side of the face.  All of his questions were answered.  I would like to stop his Plavix 5 days prior to his operation.  The patient would like to wait until after the holidays to have the surgery done.  Therefore he will contact me with a date in early January.  If it gets to March and we still haven't done his surgery, he would need a repeat ultrasound as well as a clinic visit.  We discussed the signs and symptoms of stroke and what to do should they occur.   [name], MD [facility] [phone] Pager [phone]  No changes from clinic visit.  Remains neurologically intact CV:RRR Pulm: CTA Abd  soft Plan right carotid endarterectomy with patch angioplasty.  All questions answered.   [name]

## 2016-03-27 NOTE — Discharge Summary (Signed)
Discharge Summary     Dillon [name] 04-22-1933 83 y.o. male  [id number]  Admission Date: 03/26/2016  Discharge Date: 03/27/16  Physician: Serafina Mitchell, MD  Admission Diagnosis: Right Internal Carotid Artery Stenosis I65.21   HPI:   This is a 83 y.o. male  who is referred today for evaluation of bilateral carotid stenosis, right greater than left. The patient is asymptomatic. Specifically, he denies numbness or weakness in either extremity. He denies slurred speech. He denies amaurosis fugax.  Patient has a history of three-vessel CABG by Dr. Cyndia Bent. He is medically managed for hypercholesterolemia with a statin. He is a former smoker. He is medically managed for hypertension with an ARB  Hospital Course:  The patient was admitted to the hospital and taken to the operating room on 03/26/2016 and underwent right carotid endarterectomy.  Intraoperative findings include 95% stenosis.  The pt tolerated the procedure well and was transported to the PACU in good condition.   By POD 1, the pt neuro status was in tact.  His tongue is midline and he did not have any difficulty swallowing.  He did have a mild headache that improved with Tylenol.  He was bradycardic with HR from 40's-50's and sometimes 60's.  His Toprol was held.  He was instructed to check BP and HR at home and once his HR was above 60, he will restart his his beta blocker.    The remainder of the hospital course consisted of increasing mobilization and increasing intake of solids without difficulty.    Recent Labs  03/27/16 0400  NA 137  K 4.0  CL 108  CO2 24  GLUCOSE 99  BUN 16  CALCIUM 8.3*    Recent Labs  03/26/16 1600 03/27/16 0400  WBC 8.0 7.3  HGB 11.4* 10.5*  HCT 33.4* 31.5*  PLT 184 175   No results for input(s): INR in the last 72 hours.   Discharge Instructions    CAROTID Sugery: Call MD for difficulty swallowing or speaking; weakness in arms or legs that is a new symtom; severe  headache.  If you have increased swelling in the neck and/or  are having difficulty breathing, CALL 911    Complete by:  As directed    Call MD for:  redness, tenderness, or signs of infection (pain, swelling, bleeding, redness, odor or green/yellow discharge around incision site)    Complete by:  As directed    Call MD for:  severe or increased pain, loss or decreased feeling  in affected limb(s)    Complete by:  As directed    Call MD for:  temperature >100.5    Complete by:  As directed    Discharge instructions    Complete by:  As directed    You may shower 24 hours after discharge from the hospital.   Discharge instructions    Complete by:  As directed    Hold Toprol XL for a day or two until blood pressure is above 100 and heart rate is above 60.   Driving Restrictions    Complete by:  As directed    No driving for 1 week   Increase activity slowly    Complete by:  As directed    Walk with assistance use walker or cane as needed   Lifting restrictions    Complete by:  As directed    No heavy lifting for 3-4 weeks   Resume previous diet    Complete by:  As directed  Discharge Diagnosis:  Right Internal Carotid Artery Stenosis I65.21  Secondary Diagnosis: Patient Active Problem List   Diagnosis Date Noted  . Carotid stenosis 03/26/2016  . Pneumonia 01/07/2016  . Empyema ([place]) 01/07/2016  . AKI (acute kidney injury) ([place]) 01/07/2016  . Sepsis ([place]) 01/07/2016  . Carotid disease, bilateral ([place]) 01/06/2013  . HTN (hypertension) 01/06/2013  . CAD (coronary artery disease) 07/05/2012  . Hyperlipidemia, mixed 07/05/2012  . Hypertensive heart disease 07/05/2012  . Hypothyroid 07/05/2012   Past Medical History:  Diagnosis Date  . CAD (coronary artery disease)   . Heart murmur   . History of hiatal hernia   . Hypertension   . Hypothyroidism   . Pneumonia 12/2015   hx  . S/P CABG x 3 12/17/07   LIMA to LAD,SVG to left C    Allergies as of 03/27/2016       Reactions   No Known Allergies       Medication List    TAKE these medications   amLODipine 5 MG tablet Commonly known as:  NORVASC Take 1 tablet (5 mg total) by mouth daily. What changed:  when to take this   aspirin EC 81 MG tablet Take 81 mg by mouth at bedtime.   clopidogrel 75 MG tablet Commonly known as:  PLAVIX Take 1 tablet (75 mg total) by mouth daily.   fexofenadine 180 MG tablet Commonly known as:  ALLEGRA Take 180 mg by mouth daily.   folic acid 1 MG tablet Commonly known as:  FOLVITE TAKE 1 TABLET EVERY DAY   levothyroxine 112 MCG tablet Commonly known as:  SYNTHROID, LEVOTHROID Take 112 mcg by mouth daily.   losartan 100 MG tablet Commonly known as:  COZAAR Take 50 mg by mouth daily.   metoprolol succinate 50 MG 24 hr tablet Commonly known as:  TOPROL-XL TAKE 1 TABLET (50 MG TOTAL) BY MOUTH DAILY. What changed:  how much to take  how to take this  when to take this  additional instructions   mupirocin cream 2 % Commonly known as:  BACTROBAN Apply 1 application topically daily as needed (after skin treatments).   niacin 1000 MG CR tablet Commonly known as:  NIASPAN TAKE 1 TABLET AT BEDTIME   rosuvastatin 40 MG tablet Commonly known as:  CRESTOR Take 1 tablet (40 mg total) by mouth daily. What changed:  when to take this   terbinafine 1 % cream Commonly known as:  LAMISIL Apply 1 application topically daily as needed (rash).   Vitamin D 2000 units tablet Take 2,000 Units by mouth at bedtime.       Prescriptions given: None given  Instructions: 1.  No driving x 2 weeks & while taking pain medication 2.  No heavy lifting x 2 weeks 3.  Shower daily with soap and water starting 03/28/16 4.  Pt is instructed to hold Toprol for a day or two and restart once his HR is consistently in the 60's.  He will check this with his BP machine at home.   Disposition: home  Patient's condition: is Good  Follow up: 1. Dr. [name] in 2  weeks.   Leontine Locket, PA-C [facility] [phone]   --- For [facility] use ---   Modified Rankin score at D/C (0-6): 0  IV medication needed for:  1. Hypertension: No 2. Hypotension: No  Post-op Complications: No  1. Post-op CVA or TIA: No  If yes: Event classification (right eye, left eye, right cortical, left  cortical, verterobasilar, other): n/a  If yes: Timing of event (intra-op, <6 hrs post-op, >=6 hrs post-op, unknown): n/a  2. CN injury: No  If yes: CN n/a injuried   3. Myocardial infarction: No  If yes: Dx by (EKG or clinical, Troponin): n/a  4.  CHF: No  5.  Dysrhythmia (new): No  6. Wound infection: No  7. Reperfusion symptoms: No  8. Return to OR: No  If yes: return to OR for (bleeding, neurologic, other CEA incision, other): n/a  Discharge medications: Statin use:  Yes   If No:   ASA use:  Yes  If No:   Beta blocker use:  Yes ACE-Inhibitor use:  No  ARB use:  Yes CCB use: Yes P2Y12 Antagonist use: Yes, [x]  Plavix, [ ]  Plasugrel, [ ]  Ticlopinine, [ ]  Ticagrelor, [ ]  Other, [ ]  No for medical reason, [ ]  Non-compliant, [ ]  Not-indicated Anti-coagulant use:  No, [ ]  Warfarin, [ ]  Rivaroxaban, [ ]  Dabigatran,

## 2016-03-27 NOTE — Progress Notes (Signed)
Discussed discharge instructions, follow up appt, gvien to pt and wife. No prescriptions needed.pt going home with wife.

## 2016-03-27 NOTE — Progress Notes (Addendum)
  Progress Note    03/27/2016 7:22 AM 1 Day Post-Op  Subjective:  No complaints; swallowing ok.  He has walked and voided.  Mild headache improved with Tylenol.   afebrile HR 40's-50's SB [id number] systolic [id number] RA  Vitals:   03/27/16 0420 03/27/16 0500  BP: (!) 117/53 (!) 93/52  Pulse: (!) 59 (!) 50  Resp: 18 14  Temp: 97.9 F (36.6 C)      Physical Exam: Neuro:  In tact; tongue is midline; no difficulty swallowing Lungs:  Non labored Incision:  Mild fullness but otherwise, clean and dry.  CBC    Component Value Date/Time   WBC 7.3 03/27/2016 0400   RBC 3.33 (L) 03/27/2016 0400   HGB 10.5 (L) 03/27/2016 0400   HCT 31.5 (L) 03/27/2016 0400   PLT 175 03/27/2016 0400   MCV 94.6 03/27/2016 0400   MCH 31.5 03/27/2016 0400   MCHC 33.3 03/27/2016 0400   RDW 14.8 03/27/2016 0400   LYMPHSABS 1.2 01/10/2016 0354   MONOABS 0.9 01/10/2016 0354   EOSABS 0.1 01/10/2016 0354   BASOSABS 0.0 01/10/2016 0354    BMET    Component Value Date/Time   NA 137 03/27/2016 0400   K 4.0 03/27/2016 0400   CL 108 03/27/2016 0400   CO2 24 03/27/2016 0400   GLUCOSE 99 03/27/2016 0400   BUN 16 03/27/2016 0400   CREATININE 1.08 03/27/2016 0400   CREATININE 1.10 01/18/2014 0803   CALCIUM 8.3 (L) 03/27/2016 0400   GFRNONAA >60 03/27/2016 0400   GFRAA >60 03/27/2016 0400     Intake/Output Summary (Last 24 hours) at 03/27/16 0722 Last data filed at 03/27/16 0450  Gross per 24 hour  Intake          4124.67 ml  Output              200 ml  Net          3924.67 ml     Assessment/Plan:  This is a 83 y.o. male who is s/p right CEA 1 Day Post-Op  -pt is doing well this am.  His HR is in the 40's-50's.  Will ask pt to hold Metoprolol for a day or two until HR rebounds.  Discussed with pt and his wife and he will check his blood pressure on his machine at home. -pt neuro exam is in tact -pt has ambulated -pt has voided -f/u with Dr. [name] in 2 weeks.   Leontine Locket,  PA-C [facility] [phone]

## 2016-03-27 NOTE — Progress Notes (Signed)
Patient ambulated in hallway, approximately 559ft on room air with no assistive aides.  Patient tolerated well.  Denies SOB, pain, dizziness, or other discomfort.  Will continue to monitor patient.

## 2016-03-27 NOTE — Telephone Encounter (Signed)
Sched appt 04/07/16 at 1:30. Lm on cell# to inform pt of appt.

## 2016-03-27 NOTE — Care Management Note (Signed)
Case Management Note  Patient Details  Name: Dillon [name] MRN: [id number] Date of Birth: 01-04-1934  Subjective/Objective:   S/p R CEA, was dc to home, no needs.                 Action/Plan:   Expected Discharge Date:  03/27/16               Expected Discharge Plan:  Home/Self Care  In-House Referral:     Discharge planning Services  CM Consult  Post Acute Care Choice:    Choice offered to:     DME Arranged:    DME Agency:     HH Arranged:    HH Agency:     Status of Service:  Completed, signed off  If discussed at [employer] of Stay Meetings, dates discussed:    Additional Comments:  Zenon Mayo, RN 03/27/2016, 2:30 PM

## 2016-03-27 NOTE — Interval H&P Note (Signed)
History and Physical Interval Note:  03/27/2016 9:27 PM  Dillon [name]  has presented today for surgery, with the diagnosis of Right Internal Carotid Artery Stenosis I65.21  The various methods of treatment have been discussed with the patient and family. After consideration of risks, benefits and other options for treatment, the patient has consented to  Procedure(s): RIGHT CAROTID ENDARTERECTOMY (Right) PATCH ANGIOPLASTY USING [place] (Right) as a surgical intervention .  The patient's history has been reviewed, patient examined, no change in status, stable for surgery.  I have reviewed the patient's chart and labs.  Questions were answered to the patient's satisfaction.     [name]

## 2016-03-27 NOTE — Care Management Note (Signed)
Case Management Note  Patient Details  Name: Dillon [name] MRN: [id number] Date of Birth: December 18, 1933  Subjective/Objective:    S/p R CEA, NCM will cont to follow for dc needs.                Action/Plan:   Expected Discharge Date:                  Expected Discharge Plan:     In-House Referral:     Discharge planning Services  CM Consult  Post Acute Care Choice:    Choice offered to:     DME Arranged:    DME Agency:     HH Arranged:    HH Agency:     Status of Service:  In process, will continue to follow  If discussed at Long Length of Stay Meetings, dates discussed:    Additional Comments:  Zenon Mayo, RN 03/27/2016, 7:16 AM

## 2016-03-28 ENCOUNTER — Encounter: Payer: Self-pay | Admitting: Surgery

## 2016-04-03 ENCOUNTER — Encounter: Payer: Self-pay | Admitting: Surgery

## 2016-04-04 DIAGNOSIS — L570 Actinic keratosis: Secondary | ICD-10-CM | POA: Diagnosis not present

## 2016-04-04 DIAGNOSIS — C44729 Squamous cell carcinoma of skin of left lower limb, including hip: Secondary | ICD-10-CM | POA: Diagnosis not present

## 2016-04-04 DIAGNOSIS — Z85828 Personal history of other malignant neoplasm of skin: Secondary | ICD-10-CM | POA: Diagnosis not present

## 2016-04-04 DIAGNOSIS — D0439 Carcinoma in situ of skin of other parts of face: Secondary | ICD-10-CM | POA: Diagnosis not present

## 2016-04-04 DIAGNOSIS — D485 Neoplasm of uncertain behavior of skin: Secondary | ICD-10-CM | POA: Diagnosis not present

## 2016-04-04 DIAGNOSIS — C44722 Squamous cell carcinoma of skin of right lower limb, including hip: Secondary | ICD-10-CM | POA: Diagnosis not present

## 2016-04-07 ENCOUNTER — Encounter: Payer: Self-pay | Admitting: Surgery

## 2016-04-07 ENCOUNTER — Ambulatory Visit (INDEPENDENT_AMBULATORY_CARE_PROVIDER_SITE_OTHER): Payer: Self-pay | Admitting: Surgery

## 2016-04-07 ENCOUNTER — Other Ambulatory Visit: Payer: Self-pay | Admitting: Cardiothoracic Surgery

## 2016-04-07 VITALS — BP 154/81 | HR 64 | Temp 97.4°F | Resp 16 | Ht 65.0 in | Wt 172.0 lb

## 2016-04-07 DIAGNOSIS — J869 Pyothorax without fistula: Secondary | ICD-10-CM

## 2016-04-07 DIAGNOSIS — I6521 Occlusion and stenosis of right carotid artery: Secondary | ICD-10-CM

## 2016-04-07 NOTE — Progress Notes (Signed)
Patient name: Dillon [name] MRN: [id number] DOB: 1934/01/18 Sex: male  REASON FOR VISIT:     post op CEA  HISTORY OF PRESENT ILLNESS:   Dillon [name] is a 83 y.o. male returns today for follow-up.  He is status post right carotid endarterectomy on 03/26/2016.  This was done for asymptomatic stenosis intraoperative findings included a nearly occluded right internal carotid artery was heavily calcified.  There was significant redundancy within the internal carotid artery after mobilization and therefore approximate 2 cm of the internal carotid artery with was resected.  He is back today with no neurologic complaints.   His only issue has been that of biting his lip  CURRENT MEDICATIONS:    Current Outpatient Prescriptions  Medication Sig Dispense Refill  . amLODipine (NORVASC) 5 MG tablet Take 1 tablet (5 mg total) by mouth daily. (Patient taking differently: Take 5 mg by mouth at bedtime. ) 90 tablet 3  . aspirin EC 81 MG tablet Take 81 mg by mouth at bedtime.     . Cholecalciferol (VITAMIN D) 2000 units tablet Take 2,000 Units by mouth at bedtime.    . clopidogrel (PLAVIX) 75 MG tablet Take 1 tablet (75 mg total) by mouth daily. 90 tablet 3  . fexofenadine (ALLEGRA) 180 MG tablet Take 180 mg by mouth daily.    . folic acid (FOLVITE) 1 MG tablet TAKE 1 TABLET EVERY DAY 90 tablet 2  . levothyroxine (SYNTHROID, LEVOTHROID) 112 MCG tablet Take 112 mcg by mouth daily.    Marland Kitchen losartan (COZAAR) 100 MG tablet Take 50 mg by mouth daily.    . metoprolol succinate (TOPROL-XL) 50 MG 24 hr tablet TAKE 1 TABLET (50 MG TOTAL) BY MOUTH DAILY. (Patient taking differently: Take 50 mg by mouth daily. ) 90 tablet 3  . mupirocin cream (BACTROBAN) 2 % Apply 1 application topically daily as needed (after skin treatments).     . niacin (NIASPAN) 1000 MG CR tablet TAKE 1 TABLET AT BEDTIME 90 tablet 1  . rosuvastatin (CRESTOR) 40 MG tablet Take 1 tablet (40 mg total) by mouth daily.  (Patient taking differently: Take 40 mg by mouth at bedtime. ) 90 tablet 3  . terbinafine (LAMISIL) 1 % cream Apply 1 application topically daily as needed (rash).      No current facility-administered medications for this visit.     REVIEW OF SYSTEMS:   [X]  denotes positive finding, [ ]  denotes negative finding Cardiac  Comments:  Chest pain or chest pressure:    Shortness of breath upon exertion:    Short of breath when lying flat:    Irregular heart rhythm:    Constitutional    Fever or chills:      PHYSICAL EXAM:   Vitals:   04/07/16 1258 04/07/16 1300  BP: (!) 159/81 (!) 154/81  Pulse: 64   Resp: 16   Temp: 97.4 F (36.3 C)   TempSrc: Oral   SpO2: 99%   Weight: 172 lb (78 kg)   Height: 5\' 5"  (1.651 m)     GENERAL: The patient is a well-nourished male, in no acute distress. The vital signs are documented above. CARDIOVASCULAR: There is a regular rate and rhythm. PULMONARY: Non-labored respirations Slight marginal mandibular neurapraxia on the right Carotid incision is healed nicely  STUDIES:   None   MEDICAL ISSUES:   Status post right carotid endarterectomy.  The patient is recovering very well.  He does have a marginal mandibular neurapraxia which should resolve with time.  I have him scheduled for follow-up in 6-8 months with a repeat carotid duplex to evaluate the endarterectomy site as well as contralateral side.  [name], MD [facility] [phone] Pager [phone]

## 2016-04-09 ENCOUNTER — Ambulatory Visit (INDEPENDENT_AMBULATORY_CARE_PROVIDER_SITE_OTHER): Payer: Self-pay | Admitting: Cardiothoracic Surgery

## 2016-04-09 ENCOUNTER — Encounter: Payer: Self-pay | Admitting: Cardiothoracic Surgery

## 2016-04-09 ENCOUNTER — Ambulatory Visit
Admission: RE | Admit: 2016-04-09 | Discharge: 2016-04-09 | Disposition: A | Discharge status: Home / Self Care | Payer: Medicare Other | Source: Ambulatory Visit | Attending: Cardiothoracic Surgery | Admitting: Cardiothoracic Surgery

## 2016-04-09 VITALS — BP 145/80 | HR 60 | Resp 20 | Ht 65.0 in | Wt 172.0 lb

## 2016-04-09 DIAGNOSIS — J869 Pyothorax without fistula: Secondary | ICD-10-CM

## 2016-04-09 DIAGNOSIS — J439 Emphysema, unspecified: Secondary | ICD-10-CM | POA: Diagnosis not present

## 2016-04-09 DIAGNOSIS — Z09 Encounter for follow-up examination after completed treatment for conditions other than malignant neoplasm: Secondary | ICD-10-CM

## 2016-04-09 NOTE — Progress Notes (Signed)
PCP is Daphene Calamity, MD Referring Provider is Daphene Calamity,*  Chief Complaint  Patient presents with  . Routine Post Op    2 month f/u with CXR     HPI: Scheduled visit three-month follow-up after left VATS for drainage of empyema. Cultures grew out microaerophilic strep and the patient completed a 3 week course of antibiotics. He is doing well without recurrent symptoms of shortness of breath productive cough or fever. Post thoracotomy pain is minimal. Incision is well-healed.  Chest x-ray performed today is clear.  Past Medical History:  Diagnosis Date  . CAD (coronary artery disease)   . Heart murmur   . History of hiatal hernia   . Hypertension   . Hypothyroidism   . Pneumonia 12/2015   hx  . S/P CABG x 3 12/17/07   LIMA to LAD,SVG to left C    Past Surgical History:  Procedure Laterality Date  . CORONARY ARTERY BYPASS GRAFT  12/17/07   LIMA to LAD,vein to obtuse marginal,vein to RCA  . EMPYEMA DRAINAGE Left 01/11/2016   Procedure: EMPYEMA DRAINAGE;  Surgeon: Ivin Poot, MD;  Location: [place];  Service: Thoracic;  Laterality: Left;  . ENDARTERECTOMY Right 03/26/2016   Procedure: RIGHT CAROTID ENDARTERECTOMY;  Surgeon: Serafina Mitchell, MD;  Location: [place];  Service: Vascular;  Laterality: Right;  . PATCH ANGIOPLASTY Right 03/26/2016   Procedure: PATCH ANGIOPLASTY USING [place] BIOLOGIC PATCH;  Surgeon: Serafina Mitchell, MD;  Location: [place];  Service: Vascular;  Laterality: Right;  Marland Kitchen VIDEO ASSISTED THORACOSCOPY (VATS)/DECORTICATION Left 01/11/2016   Procedure: VIDEO ASSISTED THORACOSCOPY (VATS)/DECORTICATION;  Surgeon: Ivin Poot, MD;  Location: [facility] OR;  Service: Thoracic;  Laterality: Left;    Family History  Problem Relation Age of Onset  . Heart attack Father     Social History Social History  Substance Use Topics  . Smoking status: Former Smoker    Types: Cigarettes    Quit date: 01/28/1971  . Smokeless tobacco: Never Used  .  Alcohol use Yes     Comment: socially    Current Outpatient Prescriptions  Medication Sig Dispense Refill  . amLODipine (NORVASC) 5 MG tablet Take 1 tablet (5 mg total) by mouth daily. (Patient taking differently: Take 5 mg by mouth at bedtime. ) 90 tablet 3  . aspirin EC 81 MG tablet Take 81 mg by mouth at bedtime.     . Cholecalciferol (VITAMIN D) 2000 units tablet Take 2,000 Units by mouth at bedtime.    . clopidogrel (PLAVIX) 75 MG tablet Take 1 tablet (75 mg total) by mouth daily. 90 tablet 3  . fexofenadine (ALLEGRA) 180 MG tablet Take 180 mg by mouth daily.    . folic acid (FOLVITE) 1 MG tablet TAKE 1 TABLET EVERY DAY 90 tablet 2  . levothyroxine (SYNTHROID, LEVOTHROID) 112 MCG tablet Take 112 mcg by mouth daily.    Marland Kitchen losartan (COZAAR) 100 MG tablet Take 50 mg by mouth daily.    . metoprolol succinate (TOPROL-XL) 50 MG 24 hr tablet TAKE 1 TABLET (50 MG TOTAL) BY MOUTH DAILY. (Patient taking differently: Take 50 mg by mouth daily. ) 90 tablet 3  . mupirocin cream (BACTROBAN) 2 % Apply 1 application topically daily as needed (after skin treatments).     . niacin (NIASPAN) 1000 MG CR tablet TAKE 1 TABLET AT BEDTIME 90 tablet 1  . rosuvastatin (CRESTOR) 40 MG tablet Take 1 tablet (40 mg total) by mouth daily. (Patient taking differently: Take 40 mg  by mouth at bedtime. ) 90 tablet 3  . terbinafine (LAMISIL) 1 % cream Apply 1 application topically daily as needed (rash).      No current facility-administered medications for this visit.     Allergies  Allergen Reactions  . No Known Allergies     Review of Systems  The patient recently had a right carotid endarterectomy by Dr. [name] and is doing well. No pulmonary symptoms currently.  BP (!) 145/80 (BP Location: Left Arm, Cuff Size: Normal)   Pulse 60   Resp 20   Ht 5\' 5"  (1.651 m)   Wt 172 lb (78 kg)   SpO2 97% Comment: RA  BMI 28.62 kg/m  Physical Exam      Exam    General- alert and comfortable   Lungs- clear without  rales, wheezes   Cor- regular rate and rhythm, 2/6 soft  systolic murmur    Abdomen- soft, non-tender   Extremities - warm, non-tender, minimal edema   Neuro- oriented, appropriate, no focal weakness   Diagnostic Tests: Chest x-ray clear  Impression: Patient has recovered from his left VATS for drainage of empyema. There is no restrictions on his activity. Last echo shows mild aortic stenosis.-Followed by his cardiologist Dr. Georgina Peer  He'll return as needed.  Plan: Return as needed for further thoracic surgical concerns.  Len Childs, MD [facility] [phone]

## 2016-04-11 NOTE — Addendum Note (Signed)
Addended by: Lianne Cure A on: 04/11/2016 03:12 PM   Modules accepted: Orders

## 2016-04-24 DIAGNOSIS — H18413 Arcus senilis, bilateral: Secondary | ICD-10-CM | POA: Diagnosis not present

## 2016-04-24 DIAGNOSIS — H2513 Age-related nuclear cataract, bilateral: Secondary | ICD-10-CM | POA: Diagnosis not present

## 2016-04-24 DIAGNOSIS — H02129 Mechanical ectropion of unspecified eye, unspecified eyelid: Secondary | ICD-10-CM | POA: Diagnosis not present

## 2016-04-29 ENCOUNTER — Encounter: Payer: Self-pay | Admitting: Cardiovascular Disease

## 2016-04-29 ENCOUNTER — Ambulatory Visit (INDEPENDENT_AMBULATORY_CARE_PROVIDER_SITE_OTHER): Payer: Medicare Other | Admitting: Cardiovascular Disease

## 2016-04-29 VITALS — BP 116/64 | HR 60 | Ht 66.6 in | Wt 170.0 lb

## 2016-04-29 DIAGNOSIS — I10 Essential (primary) hypertension: Secondary | ICD-10-CM

## 2016-04-29 DIAGNOSIS — E785 Hyperlipidemia, unspecified: Secondary | ICD-10-CM | POA: Diagnosis not present

## 2016-04-29 DIAGNOSIS — I350 Nonrheumatic aortic (valve) stenosis: Secondary | ICD-10-CM | POA: Diagnosis not present

## 2016-04-29 DIAGNOSIS — I739 Peripheral vascular disease, unspecified: Secondary | ICD-10-CM

## 2016-04-29 DIAGNOSIS — I779 Disorder of arteries and arterioles, unspecified: Secondary | ICD-10-CM

## 2016-04-29 DIAGNOSIS — I2510 Atherosclerotic heart disease of native coronary artery without angina pectoris: Secondary | ICD-10-CM

## 2016-04-29 DIAGNOSIS — I6521 Occlusion and stenosis of right carotid artery: Secondary | ICD-10-CM

## 2016-04-29 NOTE — Progress Notes (Signed)
Patient ID: Dillon [name], male   DOB: 1934-01-24, 83 y.o.   MRN: [id number]     HPI: Dillon [name], is a 83 y.o. male who presents to the office today for a 7 month follow-up cardiology evaluation.  In November 2009 Mr. Osley underwent emergent CABG revascularization surgery after cardiac catheterization revealed severe life-threatening anatomy with 95% ostial left main stenosis a 99% ostial RCA stenosis. Surgery was done by Dr. Cyndia Bent and he had a LIMA to the LAD, vein to the obtuse marginal, vein to the RCA. His last nuclear perfusion study in April 2012 continued to show normal perfusion.  Mr. Niznik has documented carotid disease. A carotid Doppler study  in November 2013 which showed at least 60% stenosis in his carotid arteries bilaterally which was slightly increased from previously. A f/u carotid evaluation last year demonstrated his peak right internal carotid systolic velocity 923 slightly increased from 240 in his left PICA systolic velocity up to 11 slightly increased from 200; 50-69% diameter reduction range bilaterally and was not significantly changed from one year ago.  On 12/01/2013 a follow-up study demonstrated a peak PICA velocity was now 300 with diastolic velocity at 42 and the right carotid and 218 and 61 in the left carotid.  He remains asymptomatic and these place him in the upper end of scale in the 50-69% range  Additional problems include mixed hyperlipidemia and hypertension.  He has been on Toprol-XL 50 mg and losartan 100 mg in addition to amlodipine 5 mg for blood pressure control.. In the past he had derived marked benefit with Niaspan  as well as [name]. Follow-up laboratory on his current dose of [name] and niacin 1000 mg  revealed a total cholesterol 145, triglycerides 70, HDL 64, and LDL 67.  His glucose was 111.  TSH 4.5.  He had normal renal function with a BUN of 19 and creatinine of 1.1.  He underwent echo Doppler study on 06/06/2014.  This showed an  ejection fraction at 55-60%.  There was a small systolic gradient across his aortic valve with moderately calcified leaflets.  Valve area was 1.6 cm.  He had a mean gradient of 11 and a peak gradient of 23 mm suggestive of mild aortic stenosis.  PA pressure was 31 mm.  A nuclear perfusion study which remained normal with an ejection fraction of 59% and evidence for normal perfusion.  He underwent a F/U 2-D echo Doppler study on 08/20/2015 which showed an EF of 60-65%.  The aortic valve was calcified and thickened with mildly restricted motion.  Peak and mean gradients were 25 and 14 mm consistent with mild aortic stenosis with a valve area of 1.42 cm.  I scheduled him for follow-up carotid duplex exam which was done in July 2017.  This suggested progression of his right internal carotid stenoses with velocity now at 465/131, which places him in the greater than 80% range.  He had stable left internal carotid velocities now or in the 60-79% range.  He had normal subclavian arteries bilaterally, and patent vertebral arteries with antegrade flow.  I referred him to Dr. [name] who felt that with his asymptomatic status and low risk assessment that he should undergo carotid endarterectomy.  He was referred to Dr. [name] for an office evaluation but this has not yet been scheduled.  Since I last saw him , he was hospitalized in December with community-acquired pneumonia of the left lower lung and possible empyema.  He underwent thoracentesis as  well as a VATS procedure with decortication and left empyema drainage.  His carotid surgery with Dr. [name] was ultimately postponed but was successfully done on 03/26/2016 with right carotid endarterectomy.  Intraoperative findings included a 95% stenosis.  He was discharged the following day.  Subsequently, he has felt well and denies chest pain, PND orthopnea.  He denies paresthesias, presyncope or syncope.  He presents for evaluation.  Past Medical History:  Diagnosis  Date  . CAD (coronary artery disease)   . Heart murmur   . History of hiatal hernia   . Hypertension   . Hypothyroidism   . Pneumonia 12/2015   hx  . S/P CABG x 3 12/17/07   LIMA to LAD,SVG to left C    Past Surgical History:  Procedure Laterality Date  . CORONARY ARTERY BYPASS GRAFT  12/17/07   LIMA to LAD,vein to obtuse marginal,vein to RCA  . EMPYEMA DRAINAGE Left 01/11/2016   Procedure: EMPYEMA DRAINAGE;  Surgeon: Ivin Poot, MD;  Location: [place];  Service: Thoracic;  Laterality: Left;  . ENDARTERECTOMY Right 03/26/2016   Procedure: RIGHT CAROTID ENDARTERECTOMY;  Surgeon: Serafina Mitchell, MD;  Location: [place];  Service: Vascular;  Laterality: Right;  . PATCH ANGIOPLASTY Right 03/26/2016   Procedure: PATCH ANGIOPLASTY USING Rueben Bash BIOLOGIC PATCH;  Surgeon: Serafina Mitchell, MD;  Location: [place];  Service: Vascular;  Laterality: Right;  Marland Kitchen VIDEO ASSISTED THORACOSCOPY (VATS)/DECORTICATION Left 01/11/2016   Procedure: VIDEO ASSISTED THORACOSCOPY (VATS)/DECORTICATION;  Surgeon: Ivin Poot, MD;  Location: [place];  Service: Thoracic;  Laterality: Left;    Allergies  Allergen Reactions  . No Known Allergies     Current Outpatient Prescriptions  Medication Sig Dispense Refill  . amLODipine (NORVASC) 5 MG tablet Take 1 tablet (5 mg total) by mouth daily. (Patient taking differently: Take 5 mg by mouth at bedtime. ) 90 tablet 3  . aspirin EC 81 MG tablet Take 81 mg by mouth at bedtime.     . Cholecalciferol (VITAMIN D) 2000 units tablet Take 2,000 Units by mouth at bedtime.    . clopidogrel (PLAVIX) 75 MG tablet Take 1 tablet (75 mg total) by mouth daily. 90 tablet 3  . fexofenadine (ALLEGRA) 180 MG tablet Take 180 mg by mouth daily.    . folic acid (FOLVITE) 1 MG tablet TAKE 1 TABLET EVERY DAY 90 tablet 2  . levothyroxine (SYNTHROID, LEVOTHROID) 112 MCG tablet Take 112 mcg by mouth daily.    Marland Kitchen losartan (COZAAR) 100 MG tablet Take 50 mg by mouth daily.    . metoprolol  succinate (TOPROL-XL) 50 MG 24 hr tablet TAKE 1 TABLET (50 MG TOTAL) BY MOUTH DAILY. (Patient taking differently: Take 50 mg by mouth daily. ) 90 tablet 3  . mupirocin cream (BACTROBAN) 2 % Apply 1 application topically daily as needed (after skin treatments).     . niacin (NIASPAN) 1000 MG CR tablet TAKE 1 TABLET AT BEDTIME 90 tablet 1  . rosuvastatin (CRESTOR) 40 MG tablet Take 1 tablet (40 mg total) by mouth daily. (Patient taking differently: Take 40 mg by mouth at bedtime. ) 90 tablet 3  . terbinafine (LAMISIL) 1 % cream Apply 1 application topically daily as needed (rash).      No current facility-administered medications for this visit.     Socially he is married. There are no children. He does remain active. He does walk. There is no tobacco use. He does drink occasional alcohol.  ROS General: Negative; No fevers,  chills, or night sweats;  HEENT: Negative; No changes in vision or hearing, sinus congestion, difficulty swallowing Pulmonary: Negative; No cough, wheezing, shortness of breath, hemoptysis Cardiovascular:  See HPI GI: Negative; No nausea, vomiting, diarrhea, or abdominal pain GU: Negative; No dysuria, hematuria, or difficulty voiding Musculoskeletal: Negative; no myalgias, joint pain, or weakness Hematologic/Oncology: Negative; no easy bruising, bleeding Endocrine: Positive for hypothyroidism on Synthroid replacement. Neuro: Negative; no changes in balance, headaches Skin: Negative; No rashes or skin lesions Psychiatric: Negative; No behavioral problems, depression Sleep: Negative; No snoring, daytime sleepiness, hypersomnolence, bruxism, restless legs, hypnogognic hallucinations, no cataplexy Other comprehensive 14 point system review is negative.   PE BP 116/64   Pulse 60   Ht 5' 6.6" (1.692 m)   Wt 170 lb (77.1 kg)   BMI 26.95 kg/m    Repeat BP by me was 130/70  Wt Readings from Last 3 Encounters:  04/29/16 170 lb (77.1 kg)  04/09/16 172 lb (78 kg)    04/07/16 172 lb (78 kg)   General: Alert, oriented, no distress.  HEENT: Normocephalic, atraumatic. Pupils round and reactive; sclera anicteric;no lid lag,  Nose without nasal septal hypertrophy Mouth/Parynx benign; Mallinpatti scale 2/3 Neck: No JVD, Stable right carotid endarterectomy scar, left carotid bruit. Chest wall: Nontender to palpation Lungs: clear to ausculatation and percussion; no wheezing or rales Heart: RRR, s1 s2 normal 2/6 systolic murmur in the aortic area and left sternal border.  No diastolic murmur.  No rubs thrills or heaves  Abdomen: soft, nontender; no hepatosplenomehaly, BS+; abdominal aorta nontender and not dilated by palpation. Back: No CVA tenderness Pulses 2+ Extremities: no clubbing cyanosis or edema, Homan's sign negative  Neurologic: grossly nonfocal Psychological: Normal affect and mood. Normal cognitive function  ECG (independently read by me): Normal sinus rhythm with sinus arrhythmia with an average heart rate at 60 bpm.  Normal intervals.  Small inferior Q waves.  February 2017 ECG (independently read by me): Sinus bradycardia with mild sinus arrhythmia at 58 bpm.  Mild RV conduction delay.  No significant ST segment changes.  July 2016 ECG (independently read by me): Sinus bradycardia with mild sinus arrhythmia, heart rate ranging from 48-58.  Prior December 2014 ECG: Normal sinus rhythm at 54 beats per minute; normal intervals.  LABS: I personally reviewed the blood work  from [facility] done on 07/19/2015.   Total cholesterol 142, triglycerides 120, HDL 61, LDL 57.  BMP Latest Ref Rng & Units 03/27/2016 03/26/2016 03/18/2016  Glucose 65 - 99 mg/dL 99 - 96  BUN 6 - 20 mg/dL 16 - 17  Creatinine 0.61 - 1.24 mg/dL 1.08 0.96 1.06  Sodium 135 - 145 mmol/L 137 - 135  Potassium 3.5 - 5.1 mmol/L 4.0 - 4.5  Chloride 101 - 111 mmol/L 108 - 103  CO2 22 - 32 mmol/L 24 - 24  Calcium 8.9 - 10.3 mg/dL 8.3(L) - 9.9   Hepatic Function Latest Ref Rng & Units  03/18/2016 01/15/2016 01/13/2016  Total Protein 6.5 - 8.1 g/dL 7.7 5.6(L) 5.8(L)  Albumin 3.5 - 5.0 g/dL 4.2 1.7(L) 1.5(L)  AST 15 - 41 U/L 22 53(H) 39  ALT 17 - 63 U/L 16(L) 56 60  Alk Phosphatase 38 - 126 U/L 70 116 128(H)  Total Bilirubin 0.3 - 1.2 mg/dL 0.6 0.7 0.5  Bilirubin, Direct 0.1 - 0.5 mg/dL - - -   CBC Latest Ref Rng & Units 03/27/2016 03/26/2016 03/18/2016  WBC 4.0 - 10.5 K/uL 7.3 8.0 6.8  Hemoglobin 13.0 - 17.0  g/dL 10.5(L) 11.4(L) 12.5(L)  Hematocrit 39.0 - 52.0 % 31.5(L) 33.4(L) 38.2(L)  Platelets 150 - 400 K/uL 175 184 226   Lab Results  Component Value Date   MCV 94.6 03/27/2016   MCV 93.8 03/26/2016   MCV 94.8 03/18/2016   Lab Results  Component Value Date   TSH 4.322 01/10/2016   Lab Results  Component Value Date   HGBA1C 7.2 (H) 01/10/2016     Lipid Panel     Component Value Date/Time   CHOL 145 01/18/2014 0803   CHOL 139 08/30/2012 0845   TRIG 70 01/18/2014 0803   TRIG 82 08/30/2012 0845   HDL 64 01/18/2014 0803   HDL 68 08/30/2012 0845   CHOLHDL 2.3 01/18/2014 0803   VLDL 14 01/18/2014 0803   LDLCALC 67 01/18/2014 0803   LDLCALC 55 08/30/2012 0845   IMPRESSION:  1. Aortic valve stenosis, etiology of cardiac valve disease unspecified   2. Coronary artery disease involving native coronary artery of native heart without angina pectoris   3. Essential hypertension   4. Right-sided carotid artery disease ([place])   5. Hyperlipidemia with target LDL less than 70     ASSESSMENT AND PLAN: Mr.Trusty is a 83 year old gentleman who underwent emergent CABG revascularization surgery for life-threatening coronary anatomy in November 2009.  He continues to be without anginal symptomatology.  His blood pressure today is stable on current therapy consisting of losartan 100 mg, Toprol-XL 50 mg and amlodipine 5 mg. He has a history of mixed hyperlipidemia and continues to be on combination therapy with [name], niacin.  He sees Dr. Nickolas Madrid and his last lipid  studies done at [facility] are excellent.    He has a murmur suggestive of aortic valve disease on physical examination and his echo Doppler study revealed a mean gradient of 11 and 23 mm peak gradient with a valve area of approximate 1.6 cm compatible with mild AS.  When I last saw him, I referred him for PV evaluation due to progressive carotid stenoses.  He ultimately underwent successful right carotid endarterectomy on 2/ 28/ 2018 and intraoperatively was found to have a 95% right carotid stenosis.  He is scheduled to have a follow-up carotid study by Dr. [name] in November to reassess his left carotid system.  I reviewed his hospitalization from December 2017 when he developed pneumonia and empyema and required thoracentesis and a VATS procedure with decortication and empyema drainage.  He continues to be on levothyroxine 112 g for hypothyroidism.  I recommended he continue his aspirin, Plavix.  I had started the Plavix prior to his and endarterectomy at his last office visit in October 2017.  I will see him in 6 months for cardiology reevaluation, and prior to that office visit he will undergo a follow-up echo Doppler study to reevaluate his aortic stenosis.  Time spent: 25 minutes  Troy Sine, MD, [facility]  04/29/2016 1:26 PM

## 2016-04-29 NOTE — Patient Instructions (Signed)
Your physician recommends that you schedule a follow-up appointment and echocardiogram in 6 months.

## 2016-05-15 ENCOUNTER — Other Ambulatory Visit: Payer: Self-pay | Admitting: Cardiovascular Disease

## 2016-05-16 NOTE — Telephone Encounter (Signed)
Rx(s) sent to pharmacy electronically.  

## 2016-05-18 ENCOUNTER — Other Ambulatory Visit: Payer: Self-pay | Admitting: Cardiovascular Disease

## 2016-05-19 NOTE — Telephone Encounter (Signed)
Rx request sent to pharmacy.  

## 2016-07-10 DIAGNOSIS — C4442 Squamous cell carcinoma of skin of scalp and neck: Secondary | ICD-10-CM | POA: Diagnosis not present

## 2016-07-10 DIAGNOSIS — D485 Neoplasm of uncertain behavior of skin: Secondary | ICD-10-CM | POA: Diagnosis not present

## 2016-07-10 DIAGNOSIS — Z85828 Personal history of other malignant neoplasm of skin: Secondary | ICD-10-CM | POA: Diagnosis not present

## 2016-07-10 DIAGNOSIS — L570 Actinic keratosis: Secondary | ICD-10-CM | POA: Diagnosis not present

## 2016-07-24 ENCOUNTER — Other Ambulatory Visit: Payer: Self-pay | Admitting: Cardiovascular Disease

## 2016-07-25 NOTE — Telephone Encounter (Signed)
Rx(s) sent to pharmacy electronically.  

## 2016-07-28 DIAGNOSIS — R05 Cough: Secondary | ICD-10-CM | POA: Diagnosis not present

## 2016-07-28 DIAGNOSIS — Z6829 Body mass index (BMI) 29.0-29.9, adult: Secondary | ICD-10-CM | POA: Diagnosis not present

## 2016-07-28 DIAGNOSIS — M79674 Pain in right toe(s): Secondary | ICD-10-CM | POA: Diagnosis not present

## 2016-07-28 DIAGNOSIS — J069 Acute upper respiratory infection, unspecified: Secondary | ICD-10-CM | POA: Diagnosis not present

## 2016-08-20 ENCOUNTER — Other Ambulatory Visit: Payer: Self-pay | Admitting: Cardiovascular Disease

## 2016-08-28 DIAGNOSIS — I159 Secondary hypertension, unspecified: Secondary | ICD-10-CM | POA: Diagnosis not present

## 2016-08-28 DIAGNOSIS — Z760 Encounter for issue of repeat prescription: Secondary | ICD-10-CM | POA: Diagnosis not present

## 2016-08-28 DIAGNOSIS — R7309 Other abnormal glucose: Secondary | ICD-10-CM | POA: Diagnosis not present

## 2016-08-28 DIAGNOSIS — E079 Disorder of thyroid, unspecified: Secondary | ICD-10-CM | POA: Diagnosis not present

## 2016-08-28 DIAGNOSIS — E785 Hyperlipidemia, unspecified: Secondary | ICD-10-CM | POA: Diagnosis not present

## 2016-08-28 DIAGNOSIS — I779 Disorder of arteries and arterioles, unspecified: Secondary | ICD-10-CM | POA: Diagnosis not present

## 2016-11-04 ENCOUNTER — Other Ambulatory Visit: Payer: Self-pay

## 2016-11-04 ENCOUNTER — Ambulatory Visit (HOSPITAL_COMMUNITY): Payer: Medicare Other | Attending: Internal Medicine

## 2016-11-04 DIAGNOSIS — E039 Hypothyroidism, unspecified: Secondary | ICD-10-CM | POA: Diagnosis not present

## 2016-11-04 DIAGNOSIS — I10 Essential (primary) hypertension: Secondary | ICD-10-CM | POA: Insufficient documentation

## 2016-11-04 DIAGNOSIS — E785 Hyperlipidemia, unspecified: Secondary | ICD-10-CM | POA: Diagnosis not present

## 2016-11-04 DIAGNOSIS — I2510 Atherosclerotic heart disease of native coronary artery without angina pectoris: Secondary | ICD-10-CM | POA: Insufficient documentation

## 2016-11-04 DIAGNOSIS — I350 Nonrheumatic aortic (valve) stenosis: Secondary | ICD-10-CM | POA: Insufficient documentation

## 2016-11-06 DIAGNOSIS — L570 Actinic keratosis: Secondary | ICD-10-CM | POA: Diagnosis not present

## 2016-11-06 DIAGNOSIS — Z85828 Personal history of other malignant neoplasm of skin: Secondary | ICD-10-CM | POA: Diagnosis not present

## 2016-11-12 ENCOUNTER — Other Ambulatory Visit: Payer: Self-pay | Admitting: Cardiovascular Disease

## 2016-11-13 NOTE — Telephone Encounter (Signed)
REFILL 

## 2016-11-18 ENCOUNTER — Ambulatory Visit: Payer: Medicare Other | Admitting: Cardiovascular Disease

## 2016-11-19 ENCOUNTER — Ambulatory Visit (INDEPENDENT_AMBULATORY_CARE_PROVIDER_SITE_OTHER): Payer: Medicare Other | Admitting: Cardiovascular Disease

## 2016-11-19 ENCOUNTER — Encounter: Payer: Self-pay | Admitting: Cardiovascular Disease

## 2016-11-19 VITALS — BP 130/68 | HR 60 | Ht 66.0 in | Wt 179.0 lb

## 2016-11-19 DIAGNOSIS — I6523 Occlusion and stenosis of bilateral carotid arteries: Secondary | ICD-10-CM | POA: Diagnosis not present

## 2016-11-19 DIAGNOSIS — I350 Nonrheumatic aortic (valve) stenosis: Secondary | ICD-10-CM | POA: Diagnosis not present

## 2016-11-19 DIAGNOSIS — I10 Essential (primary) hypertension: Secondary | ICD-10-CM | POA: Diagnosis not present

## 2016-11-19 DIAGNOSIS — I2510 Atherosclerotic heart disease of native coronary artery without angina pectoris: Secondary | ICD-10-CM | POA: Diagnosis not present

## 2016-11-19 NOTE — Progress Notes (Signed)
Patient ID: Dillon [name], male   DOB: 25-Nov-1933, 83 y.o.   MRN: [id number]     HPI: Dillon [name], is a 83 y.o. male who presents to the office today for a 6 month follow-up cardiology evaluation.  In November 2009 Dillon [name] underwent emergent CABG revascularization surgery after cardiac catheterization revealed severe life-threatening anatomy with 95% ostial left main stenosis a 99% ostial RCA stenosis. Surgery was done by Dr. Cyndia Bent and he had a LIMA to the LAD, vein to the obtuse marginal, vein to the RCA. His last nuclear perfusion study in April 2012 continued to show normal perfusion.  Dillon [name] has documented carotid disease. A carotid Doppler study  in November 2013 which showed at least 60% stenosis in his carotid arteries bilaterally which was slightly increased from previously. A f/u carotid evaluation last year demonstrated his peak right internal carotid systolic velocity 093 slightly increased from 240 in his left PICA systolic velocity up to 11 slightly increased from 200; 50-69% diameter reduction range bilaterally and was not significantly changed from one year ago.  On 12/01/2013 a follow-up study demonstrated a peak PICA velocity was now 235 with diastolic velocity at 42 and the right carotid and 218 and 61 in the left carotid.  He remains asymptomatic and these place him in the upper end of scale in the 50-69% range  Additional problems include mixed hyperlipidemia and hypertension.  He has been on Toprol-XL 50 mg and losartan 100 mg in addition to amlodipine 5 mg for blood pressure control.. In the past he had derived marked benefit with Niaspan  as well as [name]. Follow-up laboratory on his current dose of [name] and niacin 1000 mg  revealed a total cholesterol 145, triglycerides 70, HDL 64, and LDL 67.  His glucose was 111.  TSH 4.5.  He had normal renal function with a BUN of 19 and creatinine of 1.1.  He underwent echo Doppler study on 06/06/2014.  This showed an  ejection fraction at 55-60%.  There was a small systolic gradient across his aortic valve with moderately calcified leaflets.  Valve area was 1.6 cm.  He had a mean gradient of 11 and a peak gradient of 23 mm suggestive of mild aortic stenosis.  PA pressure was 31 mm.  A nuclear perfusion study which remained normal with an ejection fraction of 59% and evidence for normal perfusion.  He underwent a F/U 2-D echo Doppler study on 08/20/2015 which showed an EF of 60-65%.  The aortic valve was calcified and thickened with mildly restricted motion.  Peak and mean gradients were 25 and 14 mm consistent with mild aortic stenosis with a valve area of 1.42 cm.  I scheduled him for follow-up carotid duplex exam which was done in July 2017.  This suggested progression of his right internal carotid stenoses with velocity now at 465/131, which places him in the greater than 80% range.  He had stable left internal carotid velocities now or in the 60-79% range.  He had normal subclavian arteries bilaterally, and patent vertebral arteries with antegrade flow.  I referred him to Dr. [name] who felt that with his asymptomatic status and low risk assessment that he should undergo carotid endarterectomy.  He was referred to Dr. [name] for an office evaluation but this has not yet been scheduled.  He was hospitalized in December 2017 with community-acquired pneumonia of the left lower lung and possible empyema.  He underwent thoracentesis as well as a VATS procedure  with decortication and left empyema drainage.  His carotid surgery with Dr. [name] was ultimately postponed but was successfully done on 03/26/2016 with right carotid endarterectomy.  Intraoperative findings included a 95% stenosis.  He was discharged the following day.    Since I last saw him in April 2018, he has continued to do well.  He denies any chest pain, PND, orthopnea.  He denies paresthesias, presyncope or syncope.  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  He had his fifth hole in one of his career on a par 3.  He recently had blood work done in [place] by his primary physician.  Renal function was stable, although potassium was upper normal at 5.2.  Lipid studies revealed cholesterol 148, triglycerides 134, HDL 52, LDL 69.  Thyroid function studies were normal.  Hemoglobin A1c was elevated at 6.4 and his estimated average glucose was 137.  He underwent a follow-up echo Doppler study in 11/04/2016.  This continued to show hyperdynamic LV function with an EF of 65-70% with moderate LVH.  Wall motion was normal.  His aortic valve gradient have slightly increased over the year, such that his peak gradient increased from 25-38 mm, and his mean gradient from 14 mm to 20 mmHg.  Current aortic valve area is 1.57 cm placing him still in the mild to mild to moderate AS category.  He remains asymptomatic. He presents for evaluation.   Past Medical History:  Diagnosis Date  . CAD (coronary artery disease)   . Heart murmur   . History of hiatal hernia   . Hypertension   . Hypothyroidism   . Pneumonia 12/2015   hx  . S/P CABG x 3 12/17/07   LIMA to LAD,SVG to left C    Past Surgical History:  Procedure Laterality Date  . CORONARY ARTERY BYPASS GRAFT  12/17/07   LIMA to LAD,vein to obtuse marginal,vein to RCA  . EMPYEMA DRAINAGE Left 01/11/2016   Procedure: EMPYEMA DRAINAGE;  Surgeon: Ivin Poot, MD;  Location: [place];  Service: Thoracic;  Laterality: Left;  . ENDARTERECTOMY Right 03/26/2016   Procedure: RIGHT CAROTID ENDARTERECTOMY;  Surgeon: Serafina Mitchell, MD;  Location: [place];  Service: Vascular;  Laterality: Right;  . PATCH ANGIOPLASTY Right 03/26/2016   Procedure: PATCH ANGIOPLASTY USING Rueben Bash BIOLOGIC PATCH;  Surgeon: Serafina Mitchell, MD;  Location: [place];  Service: Vascular;  Laterality: Right;  Marland Kitchen VIDEO ASSISTED THORACOSCOPY (VATS)/DECORTICATION Left 01/11/2016   Procedure: VIDEO ASSISTED THORACOSCOPY  (VATS)/DECORTICATION;  Surgeon: Ivin Poot, MD;  Location: [place];  Service: Thoracic;  Laterality: Left;    No Known Allergies  Current Outpatient Prescriptions  Medication Sig Dispense Refill  . amLODipine (NORVASC) 5 MG tablet TAKE 1 TABLET EVERY DAY 90 tablet 3  . aspirin EC 81 MG tablet Take 81 mg by mouth at bedtime.     . Cholecalciferol (VITAMIN D) 2000 units tablet Take 2,000 Units by mouth at bedtime.    . clopidogrel (PLAVIX) 75 MG tablet TAKE 1 TABLET EVERY DAY 90 tablet 0  . fexofenadine (ALLEGRA) 180 MG tablet Take 180 mg by mouth daily.    . folic acid (FOLVITE) 1 MG tablet TAKE 1 TABLET EVERY DAY 90 tablet 2  . levothyroxine (SYNTHROID, LEVOTHROID) 112 MCG tablet Take 112 mcg by mouth daily.    Marland Kitchen losartan (COZAAR) 100 MG tablet TAKE 1 TABLET EVERY DAY 90 tablet 3  . metoprolol succinate (TOPROL-XL) 50 MG 24 hr tablet TAKE 1 TABLET  EVERY DAY 90 tablet 3  . mupirocin cream (BACTROBAN) 2 % Apply 1 application topically daily as needed (after skin treatments).     . Omega-3 1400 MG CAPS Take by mouth daily.    . rosuvastatin (CRESTOR) 40 MG tablet Take 1 tablet (40 mg total) by mouth daily. 90 tablet 3  . terbinafine (LAMISIL) 1 % cream Apply 1 application topically daily as needed (rash).      No current facility-administered medications for this visit.     Socially he is married. There are no children. He does remain active. He does walk. There is no tobacco use. He does drink occasional alcohol.  ROS General: Negative; No fevers, chills, or night sweats;  HEENT: Negative; No changes in vision or hearing, sinus congestion, difficulty swallowing Pulmonary: Negative; No cough, wheezing, shortness of breath, hemoptysis Cardiovascular:  See HPI GI: Negative; No nausea, vomiting, diarrhea, or abdominal pain GU: Negative; No dysuria, hematuria, or difficulty voiding Musculoskeletal: Negative; no myalgias, joint pain, or weakness Hematologic/Oncology: Negative; no easy  bruising, bleeding Endocrine: Positive for hypothyroidism on Synthroid replacement. Neuro: Negative; no changes in balance, headaches Skin: Negative; No rashes or skin lesions Psychiatric: Negative; No behavioral problems, depression Sleep: Negative; No snoring, daytime sleepiness, hypersomnolence, bruxism, restless legs, hypnogognic hallucinations, no cataplexy Other comprehensive 14 point system review is negative.   PE BP 130/68   Pulse 60   Ht '5\' 6"'$  (1.676 m)   Wt 179 lb (81.2 kg)   BMI 28.89 kg/m    Repeat blood pressure by me was 134/70.  Wt Readings from Last 3 Encounters:  11/19/16 179 lb (81.2 kg)  04/29/16 170 lb (77.1 kg)  04/09/16 172 lb (78 kg)      Physical Exam BP 130/68   Pulse 60   Ht '5\' 6"'$  (1.676 m)   Wt 179 lb (81.2 kg)   BMI 28.89 kg/m  General: Alert, oriented, no distress.  Skin: normal turgor, no rashes, warm and dry HEENT: Normocephalic, atraumatic. Pupils equal round and reactive to light; sclera anicteric; extraocular muscles intact; Fundi ** Nose without nasal septal hypertrophy Mouth/Parynx benign; Mallinpatti scale Neck: No JVD, no carotid bruits; normal carotid upstroke Lungs: clear to ausculatation and percussion; no wheezing or rales Chest wall: without tenderness to palpitation Heart: PMI not displaced, RRR, s1 s2 normal, 2/6  harsh mid peaking systolic murmur compatible with aortic stenosis, no diastolic murmur, no rubs, gallops, thrills, or heaves Abdomen: soft, nontender; no hepatosplenomehaly, BS+; abdominal aorta nontender and not dilated by palpation. Back: no CVA tenderness Pulses 2+ Musculoskeletal: full range of motion, normal strength, no joint deformities Extremities: no clubbing cyanosis or edema, Homan's sign negative  Neurologic: grossly nonfocal; Cranial nerves grossly wnl Psychologic: Normal mood and affect   ECG (independently read by me): Normal sinus rhythm with PACs.  PR interval 200 ms, QTc interval 412  ms.  April 2018 ECG (independently read by me): Normal sinus rhythm with sinus arrhythmia with an average heart rate at 60 bpm.  Normal intervals.  Small inferior Q waves.  February 2017 ECG (independently read by me): Sinus bradycardia with mild sinus arrhythmia at 58 bpm.  Mild RV conduction delay.  No significant ST segment changes.  July 2016 ECG (independently read by me): Sinus bradycardia with mild sinus arrhythmia, heart rate ranging from 48-58.  Prior December 2014 ECG: Normal sinus rhythm at 54 beats per minute; normal intervals.  LABS: I personally reviewed the blood work  from [facility] done on 07/19/2015.  Total cholesterol 142, triglycerides 120, HDL 61, LDL 57.  I extensively reviewed laboratory from [facility] primary care from 08/29/2016  BMP Latest Ref Rng & Units 03/27/2016 03/26/2016 03/18/2016  Glucose 65 - 99 mg/dL 99 - 96  BUN 6 - 20 mg/dL 16 - 17  Creatinine 0.61 - 1.24 mg/dL 1.08 0.96 1.06  Sodium 135 - 145 mmol/L 137 - 135  Potassium 3.5 - 5.1 mmol/L 4.0 - 4.5  Chloride 101 - 111 mmol/L 108 - 103  CO2 22 - 32 mmol/L 24 - 24  Calcium 8.9 - 10.3 mg/dL 8.3(L) - 9.9   Hepatic Function Latest Ref Rng & Units 03/18/2016 01/15/2016 01/13/2016  Total Protein 6.5 - 8.1 g/dL 7.7 5.6(L) 5.8(L)  Albumin 3.5 - 5.0 g/dL 4.2 1.7(L) 1.5(L)  AST 15 - 41 U/L 22 53(H) 39  ALT 17 - 63 U/L 16(L) 56 60  Alk Phosphatase 38 - 126 U/L 70 116 128(H)  Total Bilirubin 0.3 - 1.2 mg/dL 0.6 0.7 0.5  Bilirubin, Direct 0.1 - 0.5 mg/dL - - -   CBC Latest Ref Rng & Units 03/27/2016 03/26/2016 03/18/2016  WBC 4.0 - 10.5 K/uL 7.3 8.0 6.8  Hemoglobin 13.0 - 17.0 g/dL 10.5(L) 11.4(L) 12.5(L)  Hematocrit 39.0 - 52.0 % 31.5(L) 33.4(L) 38.2(L)  Platelets 150 - 400 K/uL 175 184 226   Lab Results  Component Value Date   MCV 94.6 03/27/2016   MCV 93.8 03/26/2016   MCV 94.8 03/18/2016   Lab Results  Component Value Date   TSH 4.322 01/10/2016   Lab Results  Component Value Date   HGBA1C 7.2 (H)  01/10/2016     Lipid Panel     Component Value Date/Time   CHOL 145 01/18/2014 0803   CHOL 139 08/30/2012 0845   TRIG 70 01/18/2014 0803   TRIG 82 08/30/2012 0845   HDL 64 01/18/2014 0803   HDL 68 08/30/2012 0845   CHOLHDL 2.3 01/18/2014 0803   VLDL 14 01/18/2014 0803   LDLCALC 67 01/18/2014 0803   LDLCALC 55 08/30/2012 0845   IMPRESSION:  1. Coronary artery disease involving native coronary artery of native heart without angina pectoris   2. Aortic valve stenosis, etiology of cardiac valve disease unspecified   3. Essential hypertension   4. Carotid artery stenosis status post right carotid endarterectomy 03/26/2016     ASSESSMENT AND PLAN: Dillon [name] is a 83 year old gentleman who underwent emergent CABG revascularization surgery for life-threatening coronary anatomy in November 2009.  He continues to be without anginal symptomatology.  His blood pressure today is stable on current therapy consisting of losartan 100 mg, Toprol-XL 50 mg and amlodipine 5 mg. He has a history of mixed hyperlipidemia and has continues to be on combination therapy with [name], and niacin.  I referred him for PV evaluation due to progressive carotid stenoses.  He ultimately underwent successful right carotid endarterectomy on 2/ 28/ 2018 and intraoperatively was found to have a 95% right carotid stenosis.  He is scheduled to have a follow-up carotid study by Dr. [name] in November to reassess his left carotid system.  I reviewed his hospitalization from December 2017 when he developed pneumonia and empyema and required thoracentesis and a VATS procedure with decortication and empyema drainage.   I reviewed his most recent echo Doppler study.  Aortic stenosis has progressed slightly from one year previously, such that now the peak gradient is 38 mm and the mean gradient is 20 mmHg.  AVA is 1.5 cm.  Presently,  he is asymptomatic without chest pain, presyncope or syncope, or shortness of breath with  activity.  He has cane.  Weight, and BMI is now 28.9.  I have recommended further weight loss and exercise.  I reviewed recent laboratory done at [facility] primary care center in [place].  His lipid studies are excellent.  With his hemoglobin A1c now increasing to 6.4, I have suggested discontinuance of niacin, particular since his triglycerides are low in HDL was normal.  I have recommended omega-3 fatty acids high in EPA/DHA.  We will initiate 2 capsules daily, which did help control potential increase in triglycerides.  He continues to be on levothyroxine 112 g for hypothyroidism.  I recommended he continue his aspirin, Plavix.  I had started the Plavix prior to his and endarterectomy at his office visit in October 2017.  I will continue this with his CABG revascularization surgery, and PVD status post carotid endarterectomy.  I will see him in 6 months for cardiology reevaluation.    Time spent: 25 minutes  Troy Sine, MD, [facility]  11/19/2016 11:39 AM

## 2016-11-19 NOTE — Addendum Note (Signed)
Addended by: Alvina Filbert B on: 11/19/2016 12:37 PM   Modules accepted: Orders

## 2016-11-19 NOTE — Patient Instructions (Addendum)
Medication Instructions:  STOP NIACIN  START OMEGA 3 DAILY   Labwork: FASTING LP/CMET/TSH/A1C ABOUT 1 WEEK PRIOR TO YOUR FOLLOW UP VISIT  WE WILL MAIL YOU YOUR ORDERS CLOSER TO YOUR VISIT  Testing/Procedures: NONE   Follow-Up: Your physician wants you to follow-up in: [place] will receive a reminder letter in the mail two months in advance. If you don't receive a letter, please call our office to schedule the follow-up appointment.  Any Other Special Instructions Will Be Listed Below (If Applicable). WORK ON WEIGHT LOSS AND INCREASE YOUR EXERCISE   If you need a refill on your cardiac medications before your next appointment, please call your pharmacy.

## 2016-12-04 ENCOUNTER — Other Ambulatory Visit: Payer: Self-pay | Admitting: Cardiology

## 2016-12-08 ENCOUNTER — Encounter (HOSPITAL_COMMUNITY): Payer: Medicare Other

## 2016-12-08 ENCOUNTER — Ambulatory Visit: Payer: Medicare Other | Admitting: Surgery

## 2016-12-15 ENCOUNTER — Encounter (HOSPITAL_COMMUNITY): Payer: Medicare Other

## 2016-12-15 ENCOUNTER — Ambulatory Visit: Payer: Medicare Other | Admitting: Surgery

## 2016-12-22 ENCOUNTER — Encounter: Payer: Self-pay | Admitting: Surgery

## 2016-12-22 ENCOUNTER — Ambulatory Visit (INDEPENDENT_AMBULATORY_CARE_PROVIDER_SITE_OTHER): Payer: Medicare Other | Admitting: Surgery

## 2016-12-22 ENCOUNTER — Ambulatory Visit (HOSPITAL_COMMUNITY)
Admission: RE | Admit: 2016-12-22 | Discharge: 2016-12-22 | Disposition: A | Discharge status: Home / Self Care | Payer: Medicare Other | Source: Ambulatory Visit | Attending: Surgery | Admitting: Surgery

## 2016-12-22 VITALS — BP 145/77 | HR 52 | Temp 98.3°F | Resp 16 | Ht 66.0 in | Wt 175.0 lb

## 2016-12-22 DIAGNOSIS — I6523 Occlusion and stenosis of bilateral carotid arteries: Secondary | ICD-10-CM

## 2016-12-22 DIAGNOSIS — I6521 Occlusion and stenosis of right carotid artery: Secondary | ICD-10-CM | POA: Diagnosis not present

## 2016-12-22 DIAGNOSIS — Z9889 Other specified postprocedural states: Secondary | ICD-10-CM | POA: Diagnosis not present

## 2016-12-22 DIAGNOSIS — I6522 Occlusion and stenosis of left carotid artery: Secondary | ICD-10-CM | POA: Diagnosis not present

## 2016-12-22 LAB — VAS US CAROTID
LCCADSYS: -102 cm/s
LCCAPDIAS: 17 cm/s
LEFT ECA DIAS: -13 cm/s
LICADDIAS: -17 cm/s
LICAPDIAS: -51 cm/s
Left CCA dist dias: -42 cm/s
Left CCA prox sys: 79 cm/s
Left ICA dist sys: -61 cm/s
Left ICA prox sys: -174 cm/s
RCCAPDIAS: 22 cm/s
RIGHT CCA MID DIAS: 20 cm/s
RIGHT ECA DIAS: 16 cm/s
Right CCA prox sys: 123 cm/s
Right cca dist sys: -103 cm/s

## 2016-12-22 NOTE — Progress Notes (Signed)
History of Present Illness:  Patient is a 83 y.o. year old male who presents for Jonmarc Bodkin is a 83 y.o. male returns today for follow-up.  He is status post right carotid endarterectomy on 03/26/2016.  This was done for asymptomatic stenosis.     The patient denies symptoms of TIA, amaurosis, or stroke.  The patient is currently on plavix antiplatelet therapy.   These are currently stable and followed by Dr. Claiborne Billings.  No medication or medical history changes since his last visit.    Past Medical History:  Diagnosis Date  . CAD (coronary artery disease)   . Heart murmur   . History of hiatal hernia   . Hypertension   . Hypothyroidism   . Pneumonia 12/2015   hx  . S/P CABG x 3 12/17/07   LIMA to LAD,SVG to left C    Past Surgical History:  Procedure Laterality Date  . CORONARY ARTERY BYPASS GRAFT  12/17/07   LIMA to LAD,vein to obtuse marginal,vein to RCA  . EMPYEMA DRAINAGE Left 01/11/2016   Procedure: EMPYEMA DRAINAGE;  Surgeon: Ivin Poot, MD;  Location: [place];  Service: Thoracic;  Laterality: Left;  . ENDARTERECTOMY Right 03/26/2016   Procedure: RIGHT CAROTID ENDARTERECTOMY;  Surgeon: Serafina Mitchell, MD;  Location: [place];  Service: Vascular;  Laterality: Right;  . PATCH ANGIOPLASTY Right 03/26/2016   Procedure: PATCH ANGIOPLASTY USING Rueben Bash BIOLOGIC PATCH;  Surgeon: Serafina Mitchell, MD;  Location: [place];  Service: Vascular;  Laterality: Right;  Marland Kitchen VIDEO ASSISTED THORACOSCOPY (VATS)/DECORTICATION Left 01/11/2016   Procedure: VIDEO ASSISTED THORACOSCOPY (VATS)/DECORTICATION;  Surgeon: Ivin Poot, MD;  Location[facility];  Service: Thoracic;  Laterality: Left;     Social History Social History   Tobacco Use  . Smoking status: Former Smoker    Types: Cigarettes    Last attempt to quit: 01/28/1971    Years since quitting: 45.9  . Smokeless tobacco: Never Used  Substance Use Topics  . Alcohol use: Yes    Comment: socially  . Drug use: No    Family  History Family History  Problem Relation Age of Onset  . Heart attack Father     Allergies  No Known Allergies   Current Outpatient Medications  Medication Sig Dispense Refill  . amLODipine (NORVASC) 5 MG tablet TAKE 1 TABLET EVERY DAY 90 tablet 3  . aspirin EC 81 MG tablet Take 81 mg by mouth at bedtime.     . Cholecalciferol (VITAMIN D) 2000 units tablet Take 2,000 Units by mouth at bedtime.    . clopidogrel (PLAVIX) 75 MG tablet TAKE 1 TABLET EVERY DAY 90 tablet 0  . fexofenadine (ALLEGRA) 180 MG tablet Take 180 mg by mouth daily.    . folic acid (FOLVITE) 1 MG tablet TAKE 1 TABLET EVERY DAY 90 tablet 1  . levothyroxine (SYNTHROID, LEVOTHROID) 112 MCG tablet Take 112 mcg by mouth daily.    Marland Kitchen losartan (COZAAR) 100 MG tablet TAKE 1 TABLET EVERY DAY 90 tablet 3  . metoprolol succinate (TOPROL-XL) 50 MG 24 hr tablet TAKE 1 TABLET EVERY DAY 90 tablet 3  . mupirocin cream (BACTROBAN) 2 % Apply 1 application topically daily as needed (after skin treatments).     . Omega-3 1400 MG CAPS Take by mouth daily.    . rosuvastatin (CRESTOR) 40 MG tablet Take 1 tablet (40 mg total) by mouth daily. 90 tablet 3  . terbinafine (LAMISIL) 1 % cream Apply  1 application topically daily as needed (rash).      No current facility-administered medications for this visit.     ROS:   General:  No weight loss, Fever, chills, cold  HEENT: No recent headaches, no nasal bleeding, no visual changes, no sore throat  Neurologic: No dizziness, blackouts, seizures. No recent symptoms of stroke or mini- stroke. No recent episodes of slurred speech, or temporary blindness.  Cardiac: No recent episodes of chest pain/pressure, no shortness of breath at rest.  No shortness of breath with exertion.  Denies history of atrial fibrillation or irregular heartbeat  Vascular: No history of rest pain in feet.  No history of claudication.  No history of non-healing ulcer, No history of DVT   Pulmonary: No home oxygen, no  productive cough, no hemoptysis,  No asthma or wheezing  Musculoskeletal:  [ ]  Arthritis, [ ]  Low back pain,  [ ]  Joint pain  Hematologic:No history of hypercoagulable state.  No history of easy bleeding.  No history of anemia  Gastrointestinal: No hematochezia or melena,  No gastroesophageal reflux, no trouble swallowing  Urinary: [ ]  chronic Kidney disease, [ ]  on HD - [ ]  MWF or [ ]  TTHS, [ ]  Burning with urination, [ ]  Frequent urination, [ ]  Difficulty urinating;   Skin: No rashes  Psychological: No history of anxiety,  No history of depression   Physical Examination  Vitals:   12/22/16 1405 12/22/16 1407  BP: (!) 143/66 (!) 145/77  Pulse: (!) 52   Resp: 16   Temp: 98.3 F (36.8 C)   TempSrc: Oral   SpO2: 94%   Weight: 175 lb (79.4 kg)   Height: 5\' 6"  (1.676 m)     Body mass index is 28.25 kg/m.  General:  Alert and oriented, no acute distress HEENT: Normal Neck: No bruit or JVD Pulmonary: Clear to auscultation bilaterally Cardiac: Regular Rate and Rhythm with murmur Gastrointestinal: Soft, non-tender, non-distended, no mass, no scars Skin: No rash, well healed right neck incision Extremity Pulses:  2+ radial, brachial, femoral Musculoskeletal: No deformity or edema  Neurologic: Upper and lower extremity motor 5/5 and symmetric  DATA:  Carotid duplex Right patent without evidence of re stenosis Left stable 40-59% stenosis ICA PSV 174 EDS 51   ASSESSMENT:  Asymptomatic right CEA > 80% stenosis pre-op Left carotid stenosis 40-59%   PLAN: F/U in 1 year for repeat carotid duplex.  He will call if he experiences any signs or symptoms of stroke which were reviewed today in the office.     Roxy Horseman PA-C [facility]  The patient was seen in conjunction with Dr. [name] today  I agree with the above.  I have seen and evaluated the patient.  He is status post right carotid endarterectomy for asymptomatic stenosis.   He has had no complicating events.  Duplex today shows 40-59% left-sided stenosis and a widely patent endarterectomy site.  The patient will follow-up in 1 year with a repeat carotid duplex  [name]

## 2017-02-18 ENCOUNTER — Telehealth: Payer: Self-pay

## 2017-02-18 ENCOUNTER — Other Ambulatory Visit: Payer: Self-pay

## 2017-02-18 MED ORDER — CLOPIDOGREL BISULFATE 75 MG PO TABS: 75.0000 mg | ORAL_TABLET | Freq: Every day | ORAL | 2 refills | Status: DC

## 2017-02-18 MED ORDER — CLOPIDOGREL BISULFATE 75 MG PO TABS: 75.0000 mg | ORAL_TABLET | Freq: Every day | ORAL | 3 refills | Status: DC

## 2017-02-18 NOTE — Telephone Encounter (Signed)
error 

## 2017-03-04 DIAGNOSIS — R7309 Other abnormal glucose: Secondary | ICD-10-CM | POA: Diagnosis not present

## 2017-03-04 DIAGNOSIS — I10 Essential (primary) hypertension: Secondary | ICD-10-CM | POA: Diagnosis not present

## 2017-03-04 DIAGNOSIS — E039 Hypothyroidism, unspecified: Secondary | ICD-10-CM | POA: Diagnosis not present

## 2017-03-04 DIAGNOSIS — E785 Hyperlipidemia, unspecified: Secondary | ICD-10-CM | POA: Diagnosis not present

## 2017-03-26 DIAGNOSIS — D0439 Carcinoma in situ of skin of other parts of face: Secondary | ICD-10-CM | POA: Diagnosis not present

## 2017-03-26 DIAGNOSIS — L565 Disseminated superficial actinic porokeratosis (DSAP): Secondary | ICD-10-CM | POA: Diagnosis not present

## 2017-03-26 DIAGNOSIS — Z85828 Personal history of other malignant neoplasm of skin: Secondary | ICD-10-CM | POA: Diagnosis not present

## 2017-03-26 DIAGNOSIS — L570 Actinic keratosis: Secondary | ICD-10-CM | POA: Diagnosis not present

## 2017-03-26 DIAGNOSIS — D225 Melanocytic nevi of trunk: Secondary | ICD-10-CM | POA: Diagnosis not present

## 2017-03-26 DIAGNOSIS — D485 Neoplasm of uncertain behavior of skin: Secondary | ICD-10-CM | POA: Diagnosis not present

## 2017-03-26 DIAGNOSIS — C44329 Squamous cell carcinoma of skin of other parts of face: Secondary | ICD-10-CM | POA: Diagnosis not present

## 2017-03-26 DIAGNOSIS — L821 Other seborrheic keratosis: Secondary | ICD-10-CM | POA: Diagnosis not present

## 2017-05-07 ENCOUNTER — Other Ambulatory Visit: Payer: Self-pay | Admitting: Cardiovascular Disease

## 2017-05-08 NOTE — Telephone Encounter (Signed)
REFILL 

## 2017-05-28 ENCOUNTER — Other Ambulatory Visit: Payer: Self-pay | Admitting: Cardiovascular Disease

## 2017-05-29 NOTE — Telephone Encounter (Signed)
Rx sent to pharmacy   

## 2017-06-05 ENCOUNTER — Other Ambulatory Visit: Payer: Self-pay | Admitting: Cardiology

## 2017-06-08 NOTE — Telephone Encounter (Signed)
Rx(s) sent to pharmacy electronically.  

## 2017-07-23 DIAGNOSIS — L821 Other seborrheic keratosis: Secondary | ICD-10-CM | POA: Diagnosis not present

## 2017-07-23 DIAGNOSIS — L812 Freckles: Secondary | ICD-10-CM | POA: Diagnosis not present

## 2017-07-23 DIAGNOSIS — Z85828 Personal history of other malignant neoplasm of skin: Secondary | ICD-10-CM | POA: Diagnosis not present

## 2017-07-23 DIAGNOSIS — D225 Melanocytic nevi of trunk: Secondary | ICD-10-CM | POA: Diagnosis not present

## 2017-07-23 DIAGNOSIS — D1801 Hemangioma of skin and subcutaneous tissue: Secondary | ICD-10-CM | POA: Diagnosis not present

## 2017-07-23 DIAGNOSIS — L570 Actinic keratosis: Secondary | ICD-10-CM | POA: Diagnosis not present

## 2017-08-08 ENCOUNTER — Other Ambulatory Visit: Payer: Self-pay | Admitting: Cardiovascular Disease

## 2017-08-10 NOTE — Telephone Encounter (Signed)
Rx sent to pharmacy   

## 2017-08-25 ENCOUNTER — Other Ambulatory Visit: Payer: Self-pay | Admitting: Cardiovascular Disease

## 2017-08-26 NOTE — Telephone Encounter (Signed)
Rx sent to pharmacy   

## 2017-09-07 ENCOUNTER — Other Ambulatory Visit: Payer: Self-pay | Admitting: Cardiovascular Disease

## 2017-09-07 ENCOUNTER — Other Ambulatory Visit: Payer: Self-pay | Admitting: Cardiology

## 2017-10-01 ENCOUNTER — Encounter: Payer: Self-pay | Admitting: Cardiovascular Disease

## 2017-10-01 ENCOUNTER — Ambulatory Visit (INDEPENDENT_AMBULATORY_CARE_PROVIDER_SITE_OTHER): Payer: Medicare Other | Admitting: Cardiovascular Disease

## 2017-10-01 ENCOUNTER — Encounter

## 2017-10-01 VITALS — BP 122/63 | HR 47 | Ht 66.0 in | Wt 177.4 lb

## 2017-10-01 DIAGNOSIS — E785 Hyperlipidemia, unspecified: Secondary | ICD-10-CM | POA: Diagnosis not present

## 2017-10-01 DIAGNOSIS — I10 Essential (primary) hypertension: Secondary | ICD-10-CM

## 2017-10-01 DIAGNOSIS — I350 Nonrheumatic aortic (valve) stenosis: Secondary | ICD-10-CM

## 2017-10-01 DIAGNOSIS — I2510 Atherosclerotic heart disease of native coronary artery without angina pectoris: Secondary | ICD-10-CM

## 2017-10-01 NOTE — Progress Notes (Signed)
Patient ID: Dillon [name], male   DOB: 01/19/1934, 84 y.o.   MRN: [id number]     HPI: Dillon [name], is a 84 y.o. male who presents to the office today for a 6 month follow-up cardiology evaluation.  In November 2009 Dillon [name] underwent emergent CABG revascularization surgery after cardiac catheterization revealed severe life-threatening anatomy with 95% ostial left main stenosis a 99% ostial RCA stenosis. Surgery was done by Dr. Cyndia Bent and he had a LIMA to the LAD, vein to the obtuse marginal, vein to the RCA. His last nuclear perfusion study in April 2012 continued to show normal perfusion.  Dillon [name] has documented carotid disease. A carotid Doppler study  in November 2013 which showed at least 60% stenosis in his carotid arteries bilaterally which was slightly increased from previously. A f/u carotid evaluation last year demonstrated his peak right internal carotid systolic velocity 564 slightly increased from 240 in his left PICA systolic velocity up to 11 slightly increased from 200; 50-69% diameter reduction range bilaterally and was not significantly changed from one year ago.  On 12/01/2013 a follow-up study demonstrated a peak PICA velocity was now 332 with diastolic velocity at 42 and the right carotid and 218 and 61 in the left carotid.  He remains asymptomatic and these place him in the upper end of scale in the 50-69% range  Additional problems include mixed hyperlipidemia and hypertension.  He has been on Toprol-XL 50 mg and losartan 100 mg in addition to amlodipine 5 mg for blood pressure control.. In the past he had derived marked benefit with Niaspan  as well as [name]. Follow-up laboratory on his current dose of [name] and niacin 1000 mg  revealed a total cholesterol 145, triglycerides 70, HDL 64, and LDL 67.  His glucose was 111.  TSH 4.5.  He had normal renal function with a BUN of 19 and creatinine of 1.1.  He underwent echo Doppler study on 06/06/2014.  This showed an  ejection fraction at 55-60%.  There was a small systolic gradient across his aortic valve with moderately calcified leaflets.  Valve area was 1.6 cm.  He had a mean gradient of 11 and a peak gradient of 23 mm suggestive of mild aortic stenosis.  PA pressure was 31 mm.  A nuclear perfusion study which remained normal with an ejection fraction of 59% and evidence for normal perfusion.  He underwent a F/U 2-D echo Doppler study on 08/20/2015 which showed an EF of 60-65%.  The aortic valve was calcified and thickened with mildly restricted motion.  Peak and mean gradients were 25 and 14 mm consistent with mild aortic stenosis with a valve area of 1.42 cm.  I scheduled him for follow-up carotid duplex exam which was done in July 2017.  This suggested progression of his right internal carotid stenoses with velocity now at 465/131, which places him in the greater than 80% range.  He had stable left internal carotid velocities now or in the 60-79% range.  He had normal subclavian arteries bilaterally, and patent vertebral arteries with antegrade flow.  I referred him to Dr. [name] who felt that with his asymptomatic status and low risk assessment that he should undergo carotid endarterectomy.  He was referred to Dr. [name] for an office evaluation but this has not yet been scheduled.  He was hospitalized in December 2017 with community-acquired pneumonia of the left lower lung and possible empyema.  He underwent thoracentesis as well as a VATS procedure  with decortication and left empyema drainage.  His carotid surgery with Dr. [name] was ultimately postponed but was successfully done on 03/26/2016 with right carotid endarterectomy.  Intraoperative findings included a 95% stenosis.  He was discharged the following day.    I last saw him in October 2018 at which time he was doing well without chest pain, PND, orthopnea. He denies paresthesias, presyncope or syncope.  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  He had his fifth hole in one of his career on a par 3.  He recently had blood work done in [place] by his primary physician.  Renal function was stable, although potassium was upper normal at 5.2.  Lipid studies revealed cholesterol 148, triglycerides 134, HDL 52, LDL 69.  Thyroid function studies were normal.  Hemoglobin A1c was elevated at 6.4 and his estimated average glucose was 137.  He underwent a follow-up echo Doppler study in 11/04/2016.  This continued to show hyperdynamic LV function with an EF of 65-70% with moderate LVH.  Wall motion was normal.  His aortic valve gradient have slightly increased over the year, such that his peak gradient increased from 25-38 mm, and his mean gradient from 14 mm to 20 mmHg.  Current aortic valve area is 1.57 cm placing him still in the mild to mild to moderate AS category.    Since I last saw him, he continues to remain stable.  He remains active.  Had follow-up carotid imaging with Dr. Pearlean Brownie which showed a patent carotid endarterectomy site with no evidence for restenosis or hyperplasia.  Left internal carotid artery velocities suggested a 40 to 59% stenosis.  He denies chest pain.  He denies presyncope or syncope.  He denies any heart failure symptoms.  Reviewed recent laboratory done by his primary physician on March 04, 2017.  Chemistry was stable.  Total cholesterol was 125, triglycerides 73, HDL 49, and LDL 61.  TSH is 1.77.  Free T3 was 3.07 and free T4 1.0.  He was not anemic.  He presents for evaluation  Past Medical History:  Diagnosis Date  . CAD (coronary artery disease)   . Heart murmur   . History of hiatal hernia   . Hypertension   . Hypothyroidism   . Pneumonia 12/2015   hx  . S/P CABG x 3 12/17/07   LIMA to LAD,SVG to left C    Past Surgical History:  Procedure Laterality Date  . CORONARY ARTERY BYPASS GRAFT  12/17/07   LIMA to LAD,vein to obtuse marginal,vein to RCA  . EMPYEMA DRAINAGE Left 01/11/2016   Procedure:  EMPYEMA DRAINAGE;  Surgeon: Ivin Poot, MD;  Location: [facility];  Service: Thoracic;  Laterality: Left;  . ENDARTERECTOMY Right 03/26/2016   Procedure: RIGHT CAROTID ENDARTERECTOMY;  Surgeon: Serafina Mitchell, MD;  Location: [facility];  Service: Vascular;  Laterality: Right;  . PATCH ANGIOPLASTY Right 03/26/2016   Procedure: PATCH ANGIOPLASTY USING Rueben Bash BIOLOGIC PATCH;  Surgeon: Serafina Mitchell, MD;  Location: [facility];  Service: Vascular;  Laterality: Right;  Marland Kitchen VIDEO ASSISTED THORACOSCOPY (VATS)/DECORTICATION Left 01/11/2016   Procedure: VIDEO ASSISTED THORACOSCOPY (VATS)/DECORTICATION;  Surgeon: Ivin Poot, MD;  Location: [facility];  Service: Thoracic;  Laterality: Left;    No Known Allergies  Current Outpatient Medications  Medication Sig Dispense Refill  . amLODipine (NORVASC) 5 MG tablet Take 1 tablet (5 mg total) by mouth daily. Keep OV for further refills. 60 tablet 0  . aspirin EC 81 MG tablet  Take 81 mg by mouth at bedtime.     . Cholecalciferol (VITAMIN D) 2000 units tablet Take 2,000 Units by mouth at bedtime.    . clopidogrel (PLAVIX) 75 MG tablet Take 1 tablet (75 mg total) by mouth daily. 90 tablet 3  . fexofenadine (ALLEGRA) 180 MG tablet Take 180 mg by mouth daily.    . folic acid (FOLVITE) 1 MG tablet TAKE 1 TABLET EVERY DAY 90 tablet 1  . levothyroxine (SYNTHROID, LEVOTHROID) 112 MCG tablet Take 112 mcg by mouth daily.    Marland Kitchen losartan (COZAAR) 100 MG tablet TAKE 1 TABLET EVERY DAY 90 tablet 1  . metoprolol succinate (TOPROL-XL) 50 MG 24 hr tablet TAKE 1 TABLET (50 MG TOTAL) BY MOUTH DAILY. KEEP OFFICE VISIT 30 tablet 2  . mupirocin cream (BACTROBAN) 2 % Apply 1 application topically daily as needed (after skin treatments).     . Omega-3 1400 MG CAPS Take by mouth daily.    . rosuvastatin (CRESTOR) 40 MG tablet TAKE 1 TABLET EVERY DAY KEEP OFFICE VISIT 30 tablet 2  . terbinafine (LAMISIL) 1 % cream Apply 1 application topically daily as needed (rash).      No current  facility-administered medications for this visit.     Socially he is married. There are no children. He does remain active. He does walk. There is no tobacco use. He does drink occasional alcohol.  ROS General: Negative; No fevers, chills, or night sweats;  HEENT: Negative; No changes in vision or hearing, sinus congestion, difficulty swallowing Pulmonary: Negative; No cough, wheezing, shortness of breath, hemoptysis Cardiovascular:  See HPI GI: Negative; No nausea, vomiting, diarrhea, or abdominal pain GU: Negative; No dysuria, hematuria, or difficulty voiding Musculoskeletal: Negative; no myalgias, joint pain, or weakness Hematologic/Oncology: Negative; no easy bruising, bleeding Endocrine: Positive for hypothyroidism on Synthroid replacement. Neuro: Negative; no changes in balance, headaches Skin: Negative; No rashes or skin lesions Psychiatric: Negative; No behavioral problems, depression Sleep: Negative; No snoring, daytime sleepiness, hypersomnolence, bruxism, restless legs, hypnogognic hallucinations, no cataplexy Other comprehensive 14 point system review is negative.   PE BP 122/63   Pulse (!) 47   Ht _0  (1.676 m)   Wt 177 lb 6.4 oz (80.5 kg)   BMI 28.63 kg/m    BP by me 124/74  Wt Readings from Last 3 Encounters:  10/01/17 177 lb 6.4 oz (80.5 kg)  12/22/16 175 lb (79.4 kg)  11/19/16 179 lb (81.2 kg)   General: Alert, oriented, no distress.  Skin: normal turgor, no rashes, warm and dry HEENT: Normocephalic, atraumatic. Pupils equal round and reactive to light; sclera anicteric; extraocular muscles intact;  Nose without nasal septal hypertrophy Mouth/Parynx benign; Mallinpatti scale 3 Neck: No JVD, mild carotid bruit; normal carotid upstroke Lungs: clear to ausculatation and percussion; no wheezing or rales Chest wall: without tenderness to palpitation Heart: PMI not displaced, RRR, s1 s2 normal, 2/6 systolic murmur the aortic area, no diastolic murmur, no  rubs, gallops, thrills, or heaves Abdomen: mild central adiposity; soft, nontender; no hepatosplenomehaly, BS+; abdominal aorta nontender and not dilated by palpation. Back: no CVA tenderness Pulses 2+ Musculoskeletal: full range of motion, normal strength, no joint deformities Extremities: no clubbing cyanosis or edema, Homan's sign negative  Neurologic: grossly nonfocal; Cranial nerves grossly wnl Psychologic: Normal mood and affect   ECG (independently read by me): Sinus bradycardia at 51 bpm.  Mild RV conduction delay.  No significant ST changes.  PR interval 200 ms.  October 2018 ECG (independently read  by me): Normal sinus rhythm with PACs.  PR interval 200 ms, QTc interval 412 ms.  April 2018 ECG (independently read by me): Normal sinus rhythm with sinus arrhythmia with an average heart rate at 60 bpm.  Normal intervals.  Small inferior Q waves.  February 2017 ECG (independently read by me): Sinus bradycardia with mild sinus arrhythmia at 58 bpm.  Mild RV conduction delay.  No significant ST segment changes.  July 2016 ECG (independently read by me): Sinus bradycardia with mild sinus arrhythmia, heart rate ranging from 48-58.  Prior December 2014 ECG: Normal sinus rhythm at 54 beats per minute; normal intervals.  LABS: I personally reviewed the blood work  from [facility] done on 07/19/2015.   Total cholesterol 142, triglycerides 120, HDL 61, LDL 57.  I extensively reviewed laboratory from [facility] primary care from 08/29/2016  BMP Latest Ref Rng & Units 03/27/2016 03/26/2016 03/18/2016  Glucose 65 - 99 mg/dL 99 - 96  BUN 6 - 20 mg/dL 16 - 17  Creatinine 0.61 - 1.24 mg/dL 1.08 0.96 1.06  Sodium 135 - 145 mmol/L 137 - 135  Potassium 3.5 - 5.1 mmol/L 4.0 - 4.5  Chloride 101 - 111 mmol/L 108 - 103  CO2 22 - 32 mmol/L 24 - 24  Calcium 8.9 - 10.3 mg/dL 8.3(L) - 9.9   Hepatic Function Latest Ref Rng & Units 03/18/2016 01/15/2016 01/13/2016  Total Protein 6.5 - 8.1 g/dL 7.7 5.6(L) 5.8(L)    Albumin 3.5 - 5.0 g/dL 4.2 1.7(L) 1.5(L)  AST 15 - 41 U/L 22 53(H) 39  ALT 17 - 63 U/L 16(L) 56 60  Alk Phosphatase 38 - 126 U/L 70 116 128(H)  Total Bilirubin 0.3 - 1.2 mg/dL 0.6 0.7 0.5  Bilirubin, Direct 0.1 - 0.5 mg/dL - - -   CBC Latest Ref Rng & Units 03/27/2016 03/26/2016 03/18/2016  WBC 4.0 - 10.5 K/uL 7.3 8.0 6.8  Hemoglobin 13.0 - 17.0 g/dL 10.5(L) 11.4(L) 12.5(L)  Hematocrit 39.0 - 52.0 % 31.5(L) 33.4(L) 38.2(L)  Platelets 150 - 400 K/uL 175 184 226   Lab Results  Component Value Date   MCV 94.6 03/27/2016   MCV 93.8 03/26/2016   MCV 94.8 03/18/2016   Lab Results  Component Value Date   TSH 4.322 01/10/2016   Lab Results  Component Value Date   HGBA1C 7.2 (H) 01/10/2016     Lipid Panel     Component Value Date/Time   CHOL 145 01/18/2014 0803   CHOL 139 08/30/2012 0845   TRIG 70 01/18/2014 0803   TRIG 82 08/30/2012 0845   HDL 64 01/18/2014 0803   HDL 68 08/30/2012 0845   CHOLHDL 2.3 01/18/2014 0803   VLDL 14 01/18/2014 0803   LDLCALC 67 01/18/2014 0803   LDLCALC 55 08/30/2012 0845   IMPRESSION:  1. Coronary artery disease involving native coronary artery of native heart without angina pectoris   2. Aortic valve stenosis, etiology of cardiac valve disease unspecified   3. Essential hypertension   4. Hyperlipidemia with target LDL less than 70     ASSESSMENT AND PLAN: Dillon [name] is a 84 year old gentleman who underwent emergent CABG revascularization surgery for life-threatening coronary anatomy in November 2009.  He continues to be without anginal symptomatology.  His blood pressure today is well controlled on his regimen consisting of amlodipine 5 mg, losartan 100 mg, Toprol-XL 50 mg daily.  He has mixed hyperlipidemia and most recent lipid studies were excellent on his current regimen of rosuvastatin 40 mg and 1400  mg daily of omega-3 fatty acids.  I last saw him, I discontinued his niacin for triglycerides remain normal at 73.  Recent hemoglobin A1c had  improved to 6.2 from 6.4.  His thyroid function studies are normal on his current regimen of levothyroxine 112 mcg daily.  In addition to his CAD, has significant carotid disease and underwent successful right carotid endarterectomy by Dr. [name] his most recent carotid duplex imaging was reviewed which shows a patent right carotid endarterectomy site and 40 to 59% stenosis in the left carotid artery.  When I last saw him, an echo Doppler study in October 2018 showed slight progression of his aortic valve stenosis peak gradient that it increased to 38 from 25 and a mean gradient that it increased to 20 from 14.  Aortic valve area is 1.5 cm.  Clinically he is asymptomatic with reference to his aortic stenosis.  6 months, I am recommending here to go a follow-up echo Doppler study to reassess his aortic valve.  I will see him in the office that day when I also see his wife for further evaluation.  Time spent: 25 minutes  Troy Sine, MD, [facility]  10/01/2017 10:21 AM

## 2017-10-01 NOTE — Patient Instructions (Signed)
Medication Instructions:  Your physician recommends that you continue on your current medications as directed. Please refer to the Current Medication list given to you today.  Testing/Procedures: Your physician has requested that you have an echocardiogram in 6 months. Echocardiography is a painless test that uses sound waves to create images of your heart. It provides your doctor with information about the size and shape of your heart and how well your heart's chambers and valves are working. This procedure takes approximately one hour. There are no restrictions for this procedure. This will be done at our [facility] location:  [place]: Your physician wants you to follow-up in: 6 months with Dr. Claiborne Billings (after echo).  You will receive a reminder letter in the mail two months in advance. If you don't receive a letter, please call our office to schedule the follow-up appointment.   Any Other Special Instructions Will Be Listed Below (If Applicable).     If you need a refill on your cardiac medications before your next appointment, please call your pharmacy.

## 2017-10-08 ENCOUNTER — Other Ambulatory Visit: Payer: Self-pay | Admitting: Cardiovascular Disease

## 2017-11-01 IMAGING — DX DG CHEST 2V
2 series · 2 of 2 positions shown · non-contrast
Comparison: 01/30/2016.

CLINICAL DATA: Empyema drainage.

EXAM:
CHEST  2 VIEW

[dg chest 2 view (1 of 2)]
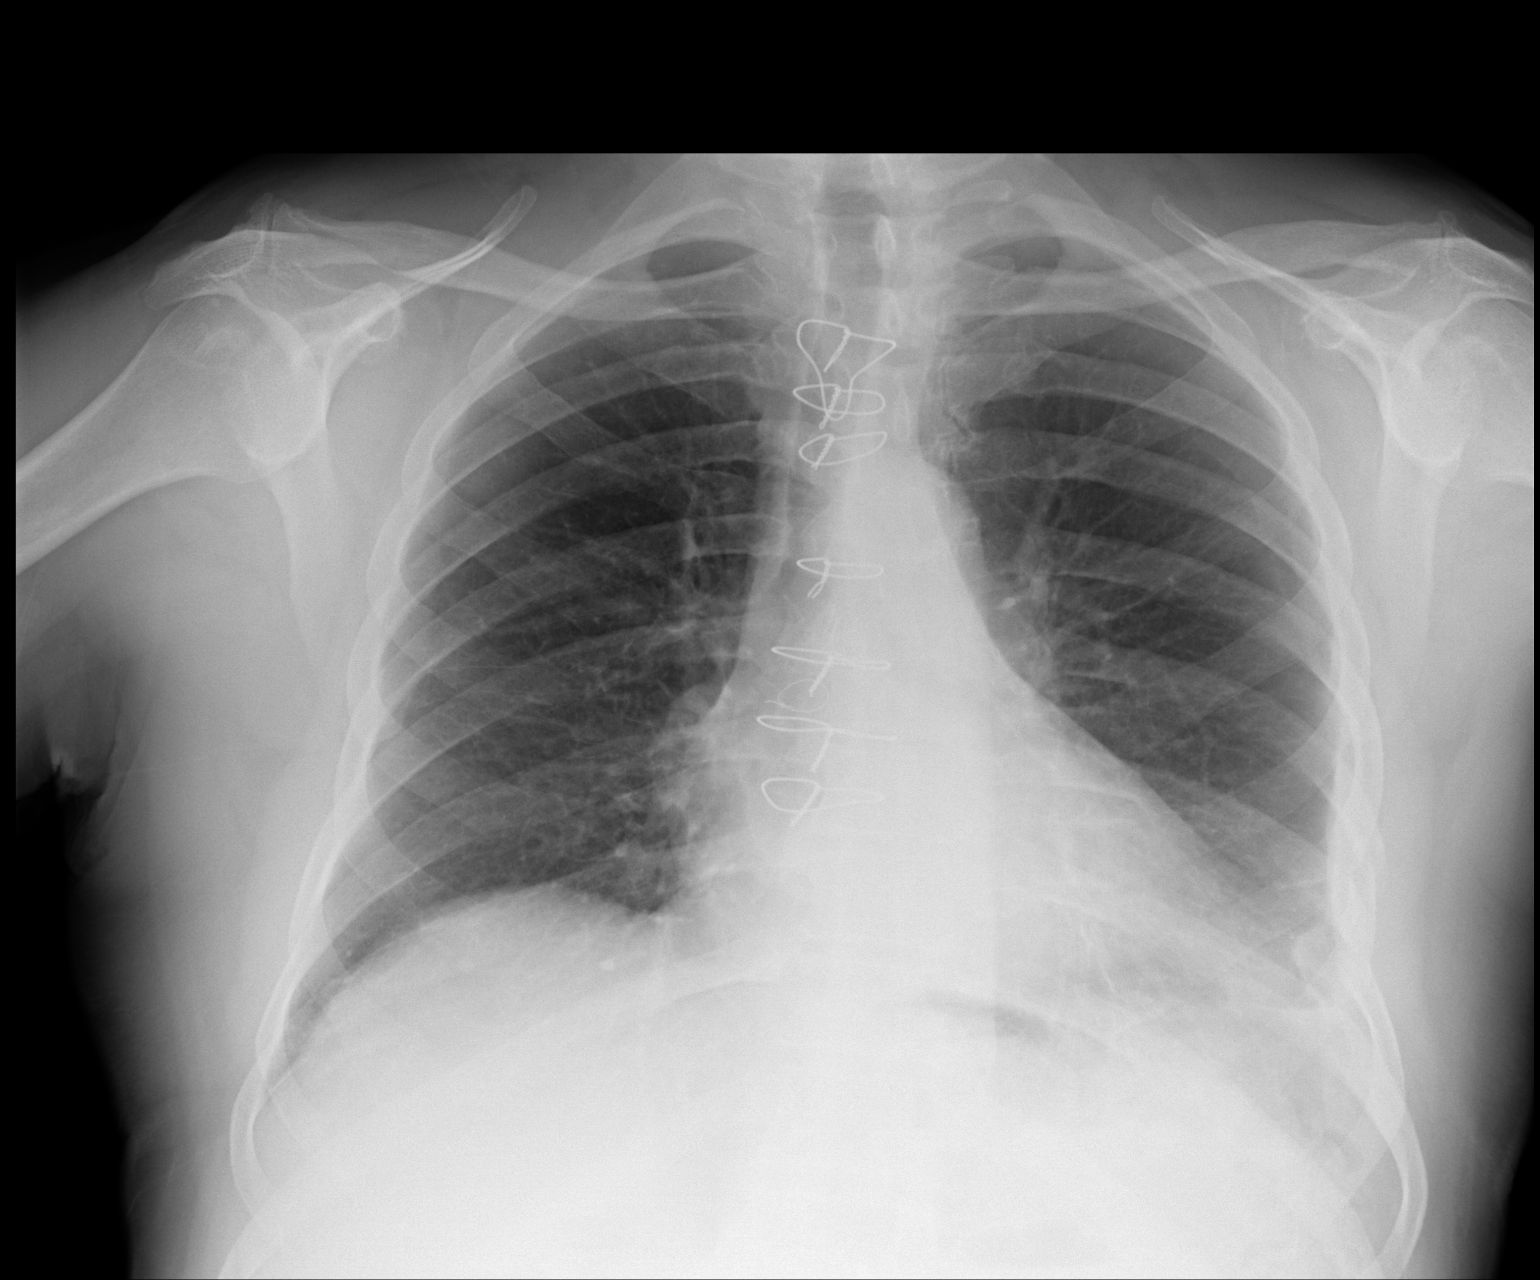

[dg chest 2 view (2 of 2)]
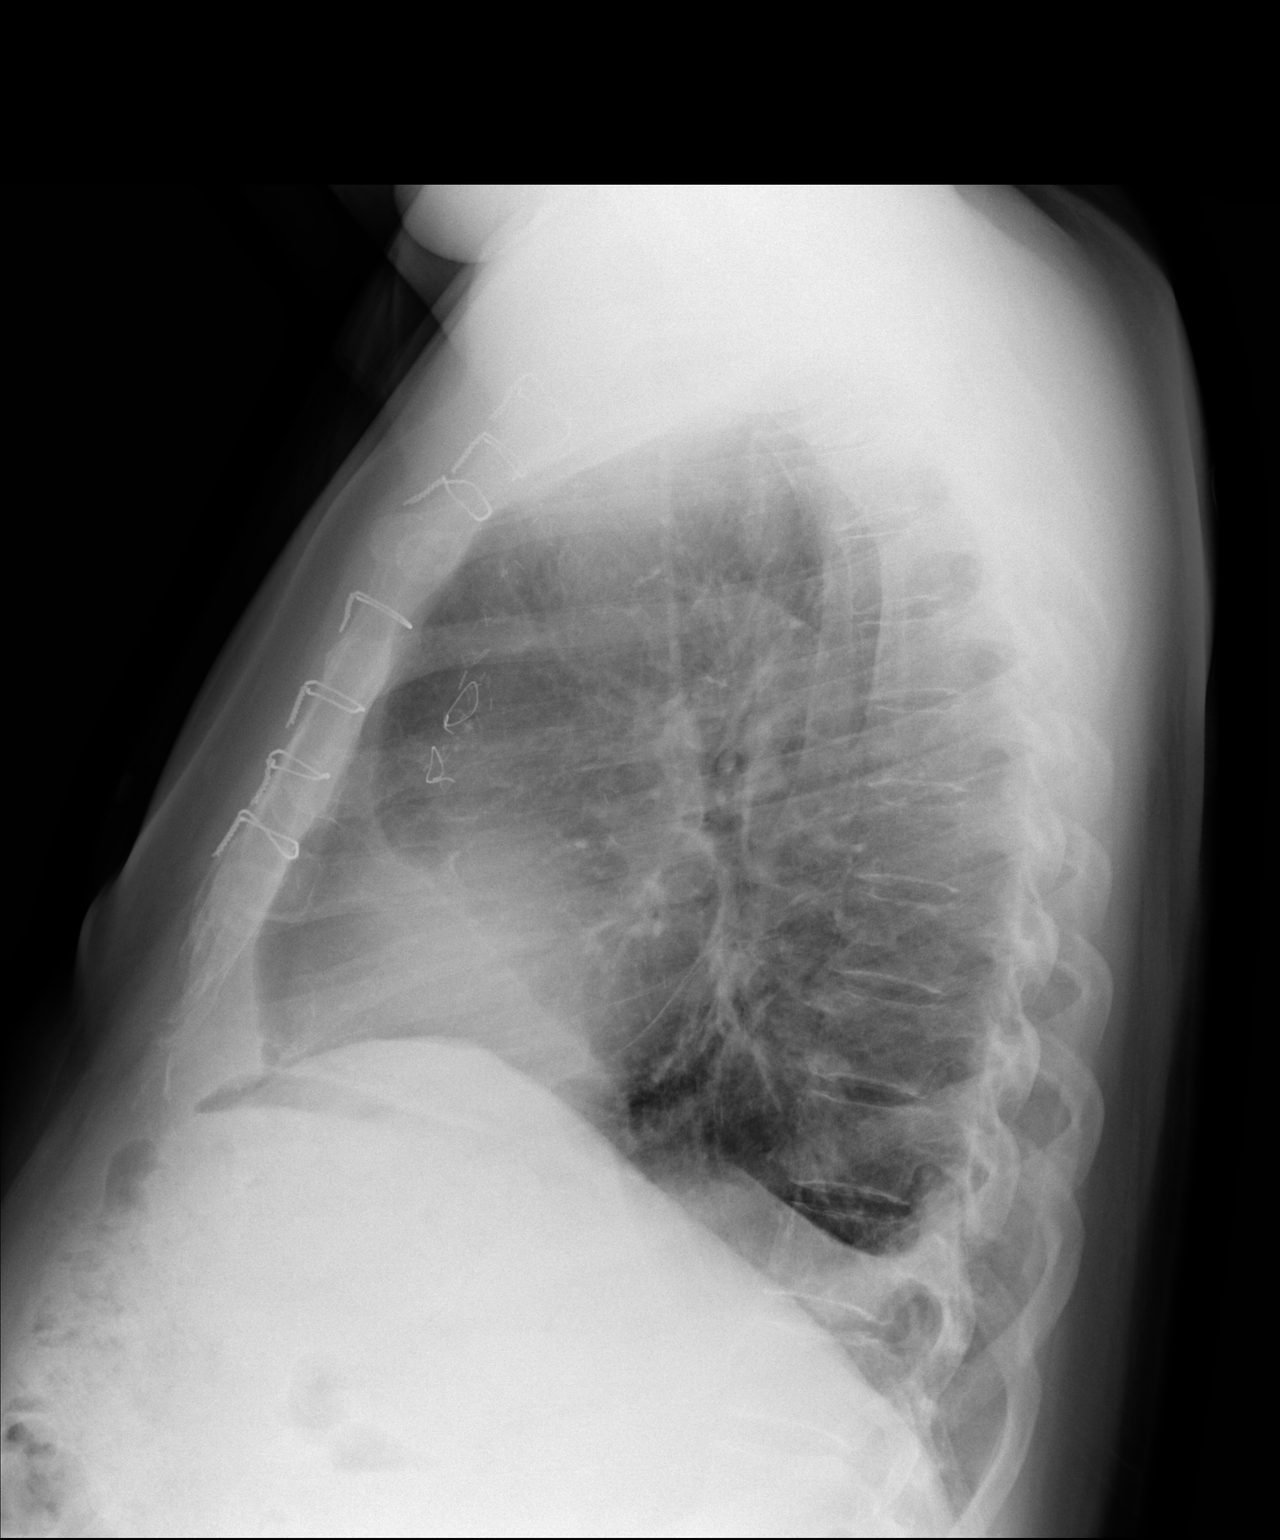

[2 of 2 positions shown; findings below may reference images not displayed]

FINDINGS: Mediastinum and hilar structures are normal. Prior CABG. Pleural
thickening on the left again noted. Interim improvement from prior
exam. Multiple left rib fractures are noted. These are stable .
IMPRESSION: 1. Left pleural thickening has diminished from prior exam. Multiple
left rib fractures are again noted.

2. Prior CABG.  Cardiomegaly.

## 2017-12-06 ENCOUNTER — Other Ambulatory Visit: Payer: Self-pay | Admitting: Cardiovascular Disease

## 2017-12-07 ENCOUNTER — Other Ambulatory Visit: Payer: Self-pay | Admitting: Cardiovascular Disease

## 2017-12-18 ENCOUNTER — Telehealth: Payer: Self-pay | Admitting: Cardiovascular Disease

## 2017-12-18 ENCOUNTER — Other Ambulatory Visit: Payer: Self-pay

## 2017-12-18 ENCOUNTER — Other Ambulatory Visit: Payer: Self-pay | Admitting: Cardiovascular Disease

## 2017-12-18 MED ORDER — LOSARTAN POTASSIUM 100 MG PO TABS: 100.0000 mg | ORAL_TABLET | Freq: Every day | ORAL | 0 refills | Status: DC

## 2017-12-18 MED ORDER — LOSARTAN POTASSIUM 100 MG PO TABS: 100.0000 mg | ORAL_TABLET | Freq: Every day | ORAL | 1 refills | Status: DC

## 2017-12-18 MED ORDER — LOSARTAN POTASSIUM 100 MG PO TABS: 100.0000 mg | ORAL_TABLET | Freq: Every day | ORAL | 6 refills | Status: DC

## 2017-12-18 NOTE — Telephone Encounter (Signed)
New Message         Pt c/o medication issue:  1. Name of Medication: Losartan  2. How are you currently taking this medication (dosage and times per day)? 1 x a day/100 mg  3. Are you having a reaction (difficulty breathing--STAT)? No  4. What is your medication issue? Patient is needing a refill patient is going out of town.      [place] [phone]

## 2017-12-18 NOTE — Telephone Encounter (Signed)
Returned call to patient Losartan refill sent to pharmacy.

## 2017-12-18 NOTE — Telephone Encounter (Signed)
Called patient, LVM, advised we do not carry that medication has samples in our office. Did advise him to call PCP they may have some different samples than we do.   Left call back number if questions.

## 2017-12-18 NOTE — Addendum Note (Signed)
Addended by: Waylan Rocher on: 12/18/2017 09:46 AM   Modules accepted: Orders

## 2017-12-18 NOTE — Telephone Encounter (Signed)
°*  STAT* If patient is at the pharmacy, call can be transferred to refill team.   1. Which medications need to be refilled? (please list name of each medication and dose if known)   losartan (COZAAR) 100 MG tablet  2. Which pharmacy/location (including street and city if local pharmacy) is medication to be sent to?  [facility] [phone]  3. Do they need a 30 day or 90 day supply? 30 days

## 2017-12-18 NOTE — Telephone Encounter (Signed)
Patient calling the office for samples of medication:   1.  What medication and dosage are you requesting samples for?  losartan (COZAAR) 100 MG tablet    2.  Are you currently out of this medication?   yes

## 2017-12-22 ENCOUNTER — Other Ambulatory Visit: Payer: Self-pay

## 2017-12-22 DIAGNOSIS — I6523 Occlusion and stenosis of bilateral carotid arteries: Secondary | ICD-10-CM

## 2018-01-05 ENCOUNTER — Other Ambulatory Visit: Payer: Self-pay

## 2018-01-05 ENCOUNTER — Encounter: Payer: Self-pay | Admitting: Family

## 2018-01-05 ENCOUNTER — Ambulatory Visit (HOSPITAL_COMMUNITY)
Admission: RE | Admit: 2018-01-05 | Discharge: 2018-01-05 | Disposition: A | Discharge status: Home / Self Care | Payer: Medicare Other | Source: Ambulatory Visit | Attending: Family | Admitting: Family

## 2018-01-05 ENCOUNTER — Ambulatory Visit (INDEPENDENT_AMBULATORY_CARE_PROVIDER_SITE_OTHER): Payer: Medicare Other | Admitting: Family

## 2018-01-05 VITALS — BP 133/58 | HR 54 | Temp 97.7°F | Resp 20 | Ht 66.0 in | Wt 174.0 lb

## 2018-01-05 DIAGNOSIS — I6523 Occlusion and stenosis of bilateral carotid arteries: Secondary | ICD-10-CM | POA: Diagnosis not present

## 2018-01-05 DIAGNOSIS — Z9889 Other specified postprocedural states: Secondary | ICD-10-CM

## 2018-01-05 NOTE — Progress Notes (Signed)
Chief Complaint: Follow up Extracranial Carotid Artery Stenosis   History of Present Illness  Dillon [name] is a 84 y.o. male who is status post right carotid endarterectomy on 03/26/2016 by Dr. [name]. This was done for asymptomatic stenosis.    The patient is currently on plavix antiplatelet therapy.   These are currently stable and followed by Dr. Claiborne Billings.   He denies any known history of stroke or TIA. Specifically he denies a history of amaurosis fugax or monocular blindness, unilateral facial drooping, hemiplegia, or receptive or expressive aphasia.    Carotid duplex on 12-22-16 showed 40-59% left-sided stenosis and a widely patent endarterectomy site.  The patient wias to follow-up in 1 year with a repeat carotid duplex.  Diabetic: no Tobacco use: former smoker, quit in 1973  Pt meds include: Statin : yes ASA: yes Other anticoagulants/antiplatelets: Plavix    Past Medical History:  Diagnosis Date  . CAD (coronary artery disease)   . Heart murmur   . History of hiatal hernia   . Hypertension   . Hypothyroidism   . Pneumonia 12/2015   hx  . S/P CABG x 3 12/17/07   LIMA to LAD,SVG to left C    Social History Social History   Tobacco Use  . Smoking status: Former Smoker    Types: Cigarettes    Last attempt to quit: 01/28/1971    Years since quitting: 46.9  . Smokeless tobacco: Never Used  Substance Use Topics  . Alcohol use: Yes    Comment: socially  . Drug use: No    Family History Family History  Problem Relation Age of Onset  . Heart attack Father     Surgical History Past Surgical History:  Procedure Laterality Date  . CORONARY ARTERY BYPASS GRAFT  12/17/07   LIMA to LAD,vein to obtuse marginal,vein to RCA  . EMPYEMA DRAINAGE Left 01/11/2016   Procedure: EMPYEMA DRAINAGE;  Surgeon: Ivin Poot, MD;  Location: [facility];  Service: Thoracic;  Laterality: Left;  . ENDARTERECTOMY Right 03/26/2016   Procedure: RIGHT CAROTID ENDARTERECTOMY;  Surgeon:  Serafina Mitchell, MD;  Location: [facility];  Service: Vascular;  Laterality: Right;  . PATCH ANGIOPLASTY Right 03/26/2016   Procedure: PATCH ANGIOPLASTY USING Rueben Bash BIOLOGIC PATCH;  Surgeon: Serafina Mitchell, MD;  Location: [facility];  Service: Vascular;  Laterality: Right;  Marland Kitchen VIDEO ASSISTED THORACOSCOPY (VATS)/DECORTICATION Left 01/11/2016   Procedure: VIDEO ASSISTED THORACOSCOPY (VATS)/DECORTICATION;  Surgeon: Ivin Poot, MD;  Location: [facility];  Service: Thoracic;  Laterality: Left;    No Known Allergies  Current Outpatient Medications  Medication Sig Dispense Refill  . amLODipine (NORVASC) 5 MG tablet TAKE 1 TABLET DAILY. PLEASE KEEP APPOINTMENT FOR FURTHER REFILLS. 90 tablet 1  . aspirin EC 81 MG tablet Take 81 mg by mouth at bedtime.     . Cholecalciferol (VITAMIN D) 2000 units tablet Take 2,000 Units by mouth at bedtime.    . clopidogrel (PLAVIX) 75 MG tablet Take 1 tablet (75 mg total) by mouth daily. 90 tablet 3  . fexofenadine (ALLEGRA) 180 MG tablet Take 180 mg by mouth daily.    . folic acid (FOLVITE) 1 MG tablet TAKE 1 TABLET EVERY DAY 90 tablet 1  . levothyroxine (SYNTHROID, LEVOTHROID) 112 MCG tablet Take 112 mcg by mouth daily.    Marland Kitchen losartan (COZAAR) 100 MG tablet Take 1 tablet (100 mg total) by mouth daily. 30 tablet 6  . metoprolol succinate (TOPROL-XL) 50 MG 24 hr tablet TAKE 1 TABLET (  50 MG TOTAL) BY MOUTH DAILY. KEEP OFFICE VISIT 30 tablet 0  . mupirocin cream (BACTROBAN) 2 % Apply 1 application topically daily as needed (after skin treatments).     . Omega-3 1400 MG CAPS Take by mouth daily.    . rosuvastatin (CRESTOR) 40 MG tablet TAKE 1 TABLET EVERY DAY KEEP OFFICE VISIT 90 tablet 0  . terbinafine (LAMISIL) 1 % cream Apply 1 application topically daily as needed (rash).      No current facility-administered medications for this visit.     Review of Systems : See HPI for pertinent positives and negatives.  Physical Examination  Vitals:   01/05/18 1437 01/05/18 1439   BP: (!) 127/58 (!) 133/58  Pulse: (!) 54   Resp: 20   Temp: 97.7 F (36.5 C)   SpO2: 96%   Weight: 174 lb (78.9 kg)   Height: 5\' 6"  (1.676 m)    Body mass index is 28.08 kg/m.  General: WDWN male in NAD GAIT: normal Eyes: PERRLA HENT: No gross abnormalities.  Pulmonary:  Respirations are non-labored, good air movement in all fields, CTAB, no rales, rhonchi, or wheezes.  Cardiac: Irregular rhythm, bradycardic (on a beta blocker), + murmur.  VASCULAR EXAM Carotid Bruits Right Left   Negative Negative     Abdominal aortic pulse is not palpable. Radial pulses are 2+ palpable and equal.                                                                                                                            LE Pulses Right Left       POPLITEAL  not palpable   not palpable       POSTERIOR TIBIAL  faintly palpable   faintly palpable        DORSALIS PEDIS      ANTERIOR TIBIAL 1+ palpable  not palpable     Gastrointestinal: soft, nontender, BS WNL, no r/g, no palpable masses. Musculoskeletal: no muscle atrophy/wasting. M/S 5/5 throughout, extremities without ischemic changes. Skin: No rashes, no ulcers, no cellulitis.   Neurologic:  A&O X 3; appropriate affect, sensation is normal; speech is normal, CN 2-12 intact, pain and light touch intact in extremities, motor exam as listed above. Psychiatric: Normal thought content, mood appropriate to clinical situation.    Assessment: Dillon [name] is a 84 y.o. male who is status post right carotid endarterectomy on 03/26/2016 by Dr. [name]. This was done for asymptomatic stenosis.   Fortunately he does not have DM and quit smoking in 1973. He takes a daily ASA, Plavix, and a statin.    Pt states he has no known history of irregular cardiac rhythm. His heart rhythm now is irregular, bradycardiac, on a beta blocker.  He is asymptomatic, no fatigue, no dyspnea, no chest pain, no feeling of palpitations.  I advised him to see Dr.  Claiborne Billings re this.   DATA Carotid Duplex (01-05-18): Right ICA: CEA site with 1-39% stenosis Left ICA:  40-59% stenosis Right ECA: >50% stenosis Slight increase in stenosis of the right ICA, increased stenosis of the right ECA, since the exam on 12-22-16.    Plan: Follow-up in 1 year with Carotid Duplex scan.   I discussed in depth with the patient the nature of atherosclerosis, and emphasized the importance of maximal medical management including strict control of blood pressure, blood glucose, and lipid levels, obtaining regular exercise, and continued cessation of smoking.  The patient is aware that without maximal medical management the underlying atherosclerotic disease process will progress, limiting the benefit of any interventions. The patient was given information about stroke prevention and what symptoms should prompt the patient to seek immediate medical care. Thank you for allowing [place] to participate in this patient's care.  Clemon Chambers, RN, MSN, FNP-C [facility] Office: [phone]  Clinic Physician: Early  01/05/18 3:07 PM

## 2018-01-05 NOTE — Patient Instructions (Signed)

## 2018-01-10 NOTE — Progress Notes (Signed)
Cardiology Office Note   Date:  01/11/2018   ID:  Dillon [name], DOB Feb 06, 1933, MRN [id number]  PCP:  Jamesetta Orleans, PA-C  Cardiologist: Dr. Claiborne Billings Chief Complaint  Patient presents with  . Palpitations  . Coronary Artery Disease     History of Present Illness: Dillon [name] is a 84 y.o. male who presents for ongoing assessment of CAD, with history of emergent CABG and revascularization surgery for life-threatening coronary anatomy in November 2009, chronic angina, aortic valve stenosis, (echocardiogram 11/03/2016 revealed trileaflet aortic valve with mild stenosis with a mean gradient of 20 mm), essential hypertension and hyperlipidemia.  He also has a history of carotid artery disease and is status post right carotid endarterectomy on 03/26/2016.  The patient was last seen by Dr. Claiborne Billings on 10/01/2017 and appeared to be stable from a cardiac standpoint without complaints of angina, good blood pressure control, and excellent control of hyperlipidemia.  A repeat echocardiogram was to be ordered for ongoing management of aortic valve disease.    He comes today after seeing PCP who auscultated an irregular heart rate. He became concerned and asked for this office visit.  He is active, plays golf several times a week and is usually active on other days. He is asymptomatic, no complaints of chest pain, DOE, or fatigue. He cannot tell that his HR is irregular.   Past Medical History:  Diagnosis Date  . CAD (coronary artery disease)   . Heart murmur   . History of hiatal hernia   . Hypertension   . Hypothyroidism   . Pneumonia 12/2015   hx  . S/P CABG x 3 12/17/07   LIMA to LAD,SVG to left C    Past Surgical History:  Procedure Laterality Date  . CORONARY ARTERY BYPASS GRAFT  12/17/07   LIMA to LAD,vein to obtuse marginal,vein to RCA  . EMPYEMA DRAINAGE Left 01/11/2016   Procedure: EMPYEMA DRAINAGE;  Surgeon: Ivin Poot, MD;  Location: [facility];  Service: Thoracic;  Laterality: Left;  .  ENDARTERECTOMY Right 03/26/2016   Procedure: RIGHT CAROTID ENDARTERECTOMY;  Surgeon: Serafina Mitchell, MD;  Location: [facility];  Service: Vascular;  Laterality: Right;  . PATCH ANGIOPLASTY Right 03/26/2016   Procedure: PATCH ANGIOPLASTY USING Rueben Bash BIOLOGIC PATCH;  Surgeon: Serafina Mitchell, MD;  Location: [facility];  Service: Vascular;  Laterality: Right;  Marland Kitchen VIDEO ASSISTED THORACOSCOPY (VATS)/DECORTICATION Left 01/11/2016   Procedure: VIDEO ASSISTED THORACOSCOPY (VATS)/DECORTICATION;  Surgeon: Ivin Poot, MD;  Location: [facility];  Service: Thoracic;  Laterality: Left;     Current Outpatient Medications  Medication Sig Dispense Refill  . amLODipine (NORVASC) 5 MG tablet TAKE 1 TABLET DAILY. PLEASE KEEP APPOINTMENT FOR FURTHER REFILLS. 90 tablet 1  . aspirin EC 81 MG tablet Take 81 mg by mouth at bedtime.     . Cholecalciferol (VITAMIN D) 2000 units tablet Take 2,000 Units by mouth at bedtime.    . clopidogrel (PLAVIX) 75 MG tablet Take 1 tablet (75 mg total) by mouth daily. 90 tablet 3  . fexofenadine (ALLEGRA) 180 MG tablet Take 180 mg by mouth daily.    . folic acid (FOLVITE) 1 MG tablet TAKE 1 TABLET EVERY DAY 90 tablet 1  . levothyroxine (SYNTHROID, LEVOTHROID) 112 MCG tablet Take 112 mcg by mouth daily.    Marland Kitchen losartan (COZAAR) 100 MG tablet Take 1 tablet (100 mg total) by mouth daily. 30 tablet 6  . metoprolol succinate (TOPROL-XL) 50 MG 24 hr tablet TAKE 1 TABLET (50 MG  TOTAL) BY MOUTH DAILY. KEEP OFFICE VISIT 30 tablet 0  . mupirocin cream (BACTROBAN) 2 % Apply 1 application topically daily as needed (after skin treatments).     . Omega-3 1400 MG CAPS Take by mouth daily.    . rosuvastatin (CRESTOR) 40 MG tablet TAKE 1 TABLET EVERY DAY KEEP OFFICE VISIT 90 tablet 0  . terbinafine (LAMISIL) 1 % cream Apply 1 application topically daily as needed (rash).      No current facility-administered medications for this visit.     Allergies:   Patient has no known allergies.    Social History:   The patient  reports that he quit smoking about 46 years ago. His smoking use included cigarettes. He has never used smokeless tobacco. He reports current alcohol use. He reports that he does not use drugs.   Family History:  The patient's family history includes Heart attack in his father.    ROS: All other systems are reviewed and negative. Unless otherwise mentioned in H&P    PHYSICAL EXAM: VS:  BP 136/70   Pulse (!) 58   Ht 5\' 6"  (1.676 m)   Wt 172 lb 12.8 oz (78.4 kg)   BMI 27.89 kg/m  , BMI Body mass index is 27.89 kg/m. GEN: Well nourished, well developed, in no acute distress HEENT: normal Neck: no JVD, carotid bruits, or masses Cardiac: RRR; occasional early beat,  2/6 systolic  murmurs, rubs, or gallops,no edema  Respiratory:  Clear to auscultation bilaterally, normal work of breathing GI: soft, nontender, nondistended, + BS MS: no deformity or atrophy Skin: warm and dry, no rash Neuro:  Strength and sensation are intact Psych: euthymic mood, full affect   EKG:  Sinus bradycardia with PAC's in a bigeminal pattern. Rate of 58 bpm.   Recent Labs: No results found for requested labs within last 8760 hours.    Lipid Panel    Component Value Date/Time   CHOL 145 01/18/2014 0803   CHOL 139 08/30/2012 0845   TRIG 70 01/18/2014 0803   TRIG 82 08/30/2012 0845   HDL 64 01/18/2014 0803   HDL 68 08/30/2012 0845   CHOLHDL 2.3 01/18/2014 0803   VLDL 14 01/18/2014 0803   LDLCALC 67 01/18/2014 0803   LDLCALC 55 08/30/2012 0845      Wt Readings from Last 3 Encounters:  01/11/18 172 lb 12.8 oz (78.4 kg)  01/05/18 174 lb (78.9 kg)  10/01/17 177 lb 6.4 oz (80.5 kg)    Other studies Reviewed: Echocardiogram 11-28-2017 Left ventricle: The cavity size was normal. Wall thickness was   increased in a pattern of moderate LVH. Systolic function was   vigorous. The estimated ejection fraction was in the range of 65%   to 70%. Wall motion was normal; there were no regional  wall   motion abnormalities. Doppler parameters are consistent with   abnormal left ventricular relaxation (grade 1 diastolic   dysfunction). The E/e&' ratio is between 8-15, suggesting   indeterminate LV filling pressure. - Aortic valve: Trileaflet. Mild stenosis. Mean gradient (S): 20 mm   Hg. Peak gradient (S): 38 mm Hg. Valve area (VTI): 1.71 cm^2.   Valve area (Vmax): 1.57 cm^2. Valve area (Vmean): 1.57 cm^2. - Mitral valve: Calcified annulus. Mildly thickened leaflets .   There was trivial regurgitation. - Left atrium: The atrium was normal in size. - Right ventricle: The cavity size was mildly dilated. - Inferior vena cava: The vessel was normal in size. The   respirophasic diameter changes  were in the normal range (>= 50%),   consistent with normal central venous pressure.  Impressions:  - Compared to a prior study in 2017, there is mild aortic stenosis   with a mean gradient of 20 mmHg (up from 13 mmHg). LVEF is higher   at 65-70%.  ASSESSMENT AND PLAN:  1. Frequent PAC's: EKG revealed sinus brady with bigeminal PAC's. Due to his bradycardia I am reluctant to increase metoprolol at this time. As he is asymptomatic and remains very active, will continue watchful waiting. He will report any new symptoms to our office to include chest pain, DOE, or fatigue.    2. AoV stenosis: He is scheduled for repeat echocardiogram in March for ongoing surveillance of AoV. He is currently asymptomatic  3. CAD: Hx of CABG in 2009: Continue current regimen of BB, ARB, and statin.   4. Carotid Artery Disease: He remains on DAPT s/p left carotid endarterectomy   5. Hypercholesterolemia: Continue on statin therapy.   Current medicines are reviewed at length with the patient today.    Labs/ tests ordered today include: None. Planned echo in March 2020.   Phill Myron. West Pugh, ANP, [facility]   01/11/2018 9:23 AM    [place] [address] Office  [phone] Fax [phone]

## 2018-01-11 ENCOUNTER — Encounter: Payer: Self-pay | Admitting: Adult Health

## 2018-01-11 ENCOUNTER — Ambulatory Visit (INDEPENDENT_AMBULATORY_CARE_PROVIDER_SITE_OTHER): Payer: Medicare Other | Admitting: Adult Health

## 2018-01-11 VITALS — BP 136/70 | HR 58 | Ht 66.0 in | Wt 172.8 lb

## 2018-01-11 DIAGNOSIS — I6523 Occlusion and stenosis of bilateral carotid arteries: Secondary | ICD-10-CM

## 2018-01-11 DIAGNOSIS — I491 Atrial premature depolarization: Secondary | ICD-10-CM

## 2018-01-11 DIAGNOSIS — R000 Tachycardia, unspecified: Secondary | ICD-10-CM | POA: Diagnosis not present

## 2018-01-11 DIAGNOSIS — I2510 Atherosclerotic heart disease of native coronary artery without angina pectoris: Secondary | ICD-10-CM | POA: Diagnosis not present

## 2018-01-11 DIAGNOSIS — I6522 Occlusion and stenosis of left carotid artery: Secondary | ICD-10-CM | POA: Diagnosis not present

## 2018-01-11 DIAGNOSIS — E7800 Pure hypercholesterolemia, unspecified: Secondary | ICD-10-CM

## 2018-01-11 NOTE — Patient Instructions (Signed)
PLEASE CALL IF YOUR SHOULD DEVELOP ANY SYMPTOMS.  Follow-Up: You will need a follow up appointment in [place] .  Please call our office 2 months in advance to schedule this appointment.  You may see Shelva Majestic, MD  Jory Sims, DNP, AACC or one of the following Advanced Practice Providers on your designated Care Team:  Almyra Deforest, PA-C   Fabian Sharp, [place]   Medication Instructions:  NO CHANGES- Your physician recommends that you continue on your current medications as directed. Please refer to the Current Medication list given to you today. If you need a refill on your cardiac medications before your next appointment, please call your pharmacy.  Labwork: When you have labs (blood work) and your tests are completely normal, you will receive your results ONLY by [place] (if you have MyChart) -OR- A paper copy in the mail.  At [facility], you and your health needs are our priority.  As part of our continuing mission to provide you with exceptional heart care, we have created designated Provider Care Teams.  These Care Teams include your primary Cardiologist (physician) and Advanced Practice Providers (APPs -  Physician Assistants and Nurse Practitioners) who all work together to provide you with the care you need, when you need it.  Thank you for choosing [facility] at [facility]!!

## 2018-01-18 ENCOUNTER — Other Ambulatory Visit: Payer: Self-pay | Admitting: *Deleted

## 2018-01-18 MED ORDER — METOPROLOL SUCCINATE ER 50 MG PO TB24: 50.0000 mg | ORAL_TABLET | Freq: Every day | ORAL | 1 refills | Status: DC

## 2018-01-29 ENCOUNTER — Other Ambulatory Visit: Payer: Self-pay

## 2018-01-29 MED ORDER — FOLIC ACID 1 MG PO TABS: 1.0000 mg | ORAL_TABLET | Freq: Every day | ORAL | 1 refills | Status: DC

## 2018-01-29 MED ORDER — METOPROLOL SUCCINATE ER 50 MG PO TB24: 50.0000 mg | ORAL_TABLET | Freq: Every day | ORAL | 1 refills | Status: DC

## 2018-01-29 MED ORDER — AMLODIPINE BESYLATE 5 MG PO TABS: ORAL_TABLET | ORAL | 1 refills | Status: DC

## 2018-01-29 MED ORDER — CLOPIDOGREL BISULFATE 75 MG PO TABS: 75.0000 mg | ORAL_TABLET | Freq: Every day | ORAL | 3 refills | Status: DC

## 2018-01-29 MED ORDER — ROSUVASTATIN CALCIUM 40 MG PO TABS: ORAL_TABLET | ORAL | 0 refills | Status: DC

## 2018-02-03 DIAGNOSIS — Z85828 Personal history of other malignant neoplasm of skin: Secondary | ICD-10-CM | POA: Diagnosis not present

## 2018-02-03 DIAGNOSIS — C44329 Squamous cell carcinoma of skin of other parts of face: Secondary | ICD-10-CM | POA: Diagnosis not present

## 2018-02-03 DIAGNOSIS — D485 Neoplasm of uncertain behavior of skin: Secondary | ICD-10-CM | POA: Diagnosis not present

## 2018-02-03 DIAGNOSIS — L570 Actinic keratosis: Secondary | ICD-10-CM | POA: Diagnosis not present

## 2018-03-24 ENCOUNTER — Other Ambulatory Visit: Payer: Self-pay

## 2018-03-24 MED ORDER — LOSARTAN POTASSIUM 100 MG PO TABS: 100.0000 mg | ORAL_TABLET | Freq: Every day | ORAL | 3 refills | Status: DC

## 2018-03-26 DIAGNOSIS — E785 Hyperlipidemia, unspecified: Secondary | ICD-10-CM | POA: Diagnosis not present

## 2018-03-26 DIAGNOSIS — Z125 Encounter for screening for malignant neoplasm of prostate: Secondary | ICD-10-CM | POA: Diagnosis not present

## 2018-03-26 DIAGNOSIS — R7309 Other abnormal glucose: Secondary | ICD-10-CM | POA: Diagnosis not present

## 2018-03-26 DIAGNOSIS — E039 Hypothyroidism, unspecified: Secondary | ICD-10-CM | POA: Diagnosis not present

## 2018-03-26 DIAGNOSIS — I10 Essential (primary) hypertension: Secondary | ICD-10-CM | POA: Diagnosis not present

## 2018-04-01 ENCOUNTER — Ambulatory Visit (HOSPITAL_COMMUNITY): Payer: PPO | Attending: Cardiovascular Disease

## 2018-04-01 DIAGNOSIS — I350 Nonrheumatic aortic (valve) stenosis: Secondary | ICD-10-CM | POA: Diagnosis not present

## 2018-04-05 ENCOUNTER — Encounter: Payer: Self-pay | Admitting: Cardiovascular Disease

## 2018-04-05 ENCOUNTER — Ambulatory Visit: Payer: PPO | Admitting: Cardiovascular Disease

## 2018-04-05 DIAGNOSIS — I6523 Occlusion and stenosis of bilateral carotid arteries: Secondary | ICD-10-CM | POA: Diagnosis not present

## 2018-04-05 DIAGNOSIS — I10 Essential (primary) hypertension: Secondary | ICD-10-CM | POA: Diagnosis not present

## 2018-04-05 DIAGNOSIS — I350 Nonrheumatic aortic (valve) stenosis: Secondary | ICD-10-CM

## 2018-04-05 DIAGNOSIS — I2510 Atherosclerotic heart disease of native coronary artery without angina pectoris: Secondary | ICD-10-CM

## 2018-04-05 DIAGNOSIS — E785 Hyperlipidemia, unspecified: Secondary | ICD-10-CM | POA: Diagnosis not present

## 2018-04-05 NOTE — Progress Notes (Signed)
Patient ID: Dillon [name], male   DOB: 04/14/1933, 85 y.o.   MRN: [id number]     HPI: Dillon [name], is a 85 y.o. male who presents to the office today for a 6 month follow-up cardiology evaluation.  In November 2009 Dillon [name] underwent emergent CABG revascularization surgery after cardiac catheterization revealed severe life-threatening anatomy with 95% ostial left main stenosis a 99% ostial RCA stenosis. Surgery was done by Dr. Cyndia Bent and he had a LIMA to the LAD, vein to the obtuse marginal, vein to the RCA. His last nuclear perfusion study in April 2012 continued to show normal perfusion.  Dillon [name] has documented carotid disease. A carotid Doppler study  in November 2013 which showed at least 60% stenosis in his carotid arteries bilaterally which was slightly increased from previously. A f/u carotid evaluation last year demonstrated his peak right internal carotid systolic velocity 149 slightly increased from 240 in his left PICA systolic velocity up to 11 slightly increased from 200; 50-69% diameter reduction range bilaterally and was not significantly changed from one year ago.  On 12/01/2013 a follow-up study demonstrated a peak PICA velocity was now 702 with diastolic velocity at 42 and the right carotid and 218 and 61 in the left carotid.  He remains asymptomatic and these place him in the upper end of scale in the 50-69% range  Additional problems include mixed hyperlipidemia and hypertension.  He has been on Toprol-XL 50 mg and losartan 100 mg in addition to amlodipine 5 mg for blood pressure control.. In the past he had derived marked benefit with Niaspan  as well as [name]. Follow-up laboratory on his current dose of [name] and niacin 1000 mg  revealed a total cholesterol 145, triglycerides 70, HDL 64, and LDL 67.  His glucose was 111.  TSH 4.5.  He had normal renal function with a BUN of 19 and creatinine of 1.1.  He underwent echo Doppler study on 06/06/2014.  This showed an  ejection fraction at 55-60%.  There was a small systolic gradient across his aortic valve with moderately calcified leaflets.  Valve area was 1.6 cm.  He had a mean gradient of 11 and a peak gradient of 23 mm suggestive of mild aortic stenosis.  PA pressure was 31 mm.  A nuclear perfusion study which remained normal with an ejection fraction of 59% and evidence for normal perfusion.  He underwent a F/U 2-D echo Doppler study on 08/20/2015 which showed an EF of 60-65%.  The aortic valve was calcified and thickened with mildly restricted motion.  Peak and mean gradients were 25 and 14 mm consistent with mild aortic stenosis with a valve area of 1.42 cm.  I scheduled him for follow-up carotid duplex exam which was done in July 2017.  This suggested progression of his right internal carotid stenoses with velocity now at 465/131, which places him in the greater than 80% range.  He had stable left internal carotid velocities now or in the 60-79% range.  He had normal subclavian arteries bilaterally, and patent vertebral arteries with antegrade flow.  I referred him to Dr. [name] who felt that with his asymptomatic status and low risk assessment that he should undergo carotid endarterectomy.  He was referred to Dr. [name] for an office evaluation but this has not yet been scheduled.  He was hospitalized in December 2017 with community-acquired pneumonia of the left lower lung and possible empyema.  He underwent thoracentesis as well as a VATS procedure  with decortication and left empyema drainage.  His carotid surgery with Dr. [name] was ultimately postponed but was successfully done on 03/26/2016 with right carotid endarterectomy.  Intraoperative findings included a 95% stenosis.  He was discharged the following day.    I  saw him in October 2018 at which time he was doing well without chest pain, PND, orthopnea. He denies paresthesias, presyncope or syncope.  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  He had his fifth hole in one of his career on a par 3.  He recently had blood work done in [place] by his primary physician.  Renal function was stable, although potassium was upper normal at 5.2.  Lipid studies revealed cholesterol 148, triglycerides 134, HDL 52, LDL 69.  Thyroid function studies were normal.  Hemoglobin A1c was elevated at 6.4 and his estimated average glucose was 137.  He underwent a follow-up echo Doppler study in 11/04/2016.  This continued to show hyperdynamic LV function with an EF of 65-70% with moderate LVH.  Wall motion was normal.  His aortic valve gradient have slightly increased over the year, such that his peak gradient increased from 25 to 38 mm, and his mean gradient from 14 mm to 20 mmHg. Aortic valve area was 1.57 cm placing him still in the mild to mild to moderate AS category.    He had follow-up carotid imaging with Dr. [name] which showed a patent carotid endarterectomy site with no evidence for restenosis or hyperplasia.  Left internal carotid artery velocities suggested a 40 to 59% stenosis.  When I last saw him in September 2019 he denied any chest pain, presyncope or syncope or any symptoms of heart failure.  I reviewed recent laboratory done by his primary physician on March 04, 2017.  Chemistry was stable.  Total cholesterol was 125, triglycerides 73, HDL 49, and LDL 61.  TSH is 1.77.  Free T3 was 3.07 and free T4 1.0.  He was not anemic.  HbA1c 6.2  Dillon [name] underwent an evaluation by Jory Sims, NP in December 2019 after developing some mild irregularity to his heart rate was noted by his primary physician.  He was felt to have occasional to frequent PACs and had bigeminal PACs.  This ultimately resolved on its own.  On April 01, 2018 he underwent a follow-up echo Doppler study.  This continues to show normal LV function with an EF of 55 to 60%.  There was mitral annular calcification.  His aortic stenosis was in the moderate range with a  mean gradient now at 25 and a peak instantantaneous gradient at 41 with a valve area of 1.3 cm.  He continues to feel well and specifically denies chest pain PND orthopnea, presyncope or syncope.  He presents for reevaluation.   Past Medical History:  Diagnosis Date  . CAD (coronary artery disease)   . Heart murmur   . History of hiatal hernia   . Hypertension   . Hypothyroidism   . Pneumonia 12/2015   hx  . S/P CABG x 3 12/17/07   LIMA to LAD,SVG to left C    Past Surgical History:  Procedure Laterality Date  . CORONARY ARTERY BYPASS GRAFT  12/17/07   LIMA to LAD,vein to obtuse marginal,vein to RCA  . EMPYEMA DRAINAGE Left 01/11/2016   Procedure: EMPYEMA DRAINAGE;  Surgeon: Ivin Poot, MD;  Location: [facility];  Service: Thoracic;  Laterality: Left;  . ENDARTERECTOMY Right 03/26/2016   Procedure: RIGHT CAROTID ENDARTERECTOMY;  Surgeon: Serafina Mitchell, MD;  Location: [facility];  Service: Vascular;  Laterality: Right;  . PATCH ANGIOPLASTY Right 03/26/2016   Procedure: PATCH ANGIOPLASTY USING Rueben Bash BIOLOGIC PATCH;  Surgeon: Serafina Mitchell, MD;  Location: [facility];  Service: Vascular;  Laterality: Right;  Marland Kitchen VIDEO ASSISTED THORACOSCOPY (VATS)/DECORTICATION Left 01/11/2016   Procedure: VIDEO ASSISTED THORACOSCOPY (VATS)/DECORTICATION;  Surgeon: Ivin Poot, MD;  Location: [facility];  Service: Thoracic;  Laterality: Left;    No Known Allergies  Current Outpatient Medications  Medication Sig Dispense Refill  . amLODipine (NORVASC) 5 MG tablet TAKE 1 TABLET DAILY. 90 tablet 1  . aspirin EC 81 MG tablet Take 81 mg by mouth at bedtime.     . Cholecalciferol (VITAMIN D) 2000 units tablet Take 2,000 Units by mouth at bedtime.    . clopidogrel (PLAVIX) 75 MG tablet Take 1 tablet (75 mg total) by mouth daily. 90 tablet 3  . fexofenadine (ALLEGRA) 180 MG tablet Take 180 mg by mouth daily.    . folic acid (FOLVITE) 1 MG tablet Take 1 tablet (1 mg total) by mouth daily. 90 tablet 1  .  levothyroxine (SYNTHROID, LEVOTHROID) 112 MCG tablet Take 112 mcg by mouth daily.    Marland Kitchen losartan (COZAAR) 100 MG tablet Take 1 tablet (100 mg total) by mouth daily. 90 tablet 3  . metoprolol succinate (TOPROL-XL) 50 MG 24 hr tablet Take 1 tablet (50 mg total) by mouth daily. Take with or immediately following a meal. 90 tablet 1  . mupirocin cream (BACTROBAN) 2 % Apply 1 application topically daily as needed (after skin treatments).     . Omega-3 1400 MG CAPS Take by mouth daily.    . rosuvastatin (CRESTOR) 40 MG tablet TAKE 1 TABLET EVERY DAY KEEP OFFICE VISIT 90 tablet 0  . terbinafine (LAMISIL) 1 % cream Apply 1 application topically daily as needed (rash).      No current facility-administered medications for this visit.     Socially he is married. There are no children. He does remain active. He does walk. There is no tobacco use. He does drink occasional alcohol.  ROS General: Negative; No fevers, chills, or night sweats;  HEENT: Negative; No changes in vision or hearing, sinus congestion, difficulty swallowing Pulmonary: Negative; No cough, wheezing, shortness of breath, hemoptysis Cardiovascular:  See HPI GI: Negative; No nausea, vomiting, diarrhea, or abdominal pain GU: Negative; No dysuria, hematuria, or difficulty voiding Musculoskeletal: Negative; no myalgias, joint pain, or weakness Hematologic/Oncology: Negative; no easy bruising, bleeding Endocrine: Positive for hypothyroidism on Synthroid replacement. Neuro: Negative; no changes in balance, headaches Skin: Negative; No rashes or skin lesions Psychiatric: Negative; No behavioral problems, depression Sleep: Negative; No snoring, daytime sleepiness, hypersomnolence, bruxism, restless legs, hypnogognic hallucinations, no cataplexy Other comprehensive 14 point system review is negative.   PE BP 124/60   Pulse 60   Ht '5\' 6"'$  (1.676 m)   Wt 175 lb 3.2 oz (79.5 kg)   BMI 28.28 kg/m    Repeat blood pressure by me  128/60  Wt Readings from Last 3 Encounters:  04/05/18 175 lb 3.2 oz (79.5 kg)  01/11/18 172 lb 12.8 oz (78.4 kg)  01/05/18 174 lb (78.9 kg)   General: Alert, oriented, no distress.  Skin: normal turgor, no rashes, warm and dry HEENT: Normocephalic, atraumatic. Pupils equal round and reactive to light; sclera anicteric; extraocular muscles intact;  Nose without nasal septal hypertrophy Mouth/Parynx benign; Mallinpatti scale 3 Neck: No JVD, no carotid bruits; normal carotid  upstroke Lungs: clear to ausculatation and percussion; no wheezing or rales Chest wall: without tenderness to palpitation Heart: PMI not displaced, RRR, s1 s2 normal, 2/6 systolic murmur, no diastolic murmur, no rubs, gallops, thrills, or heaves Abdomen: soft, nontender; no hepatosplenomehaly, BS+; abdominal aorta nontender and not dilated by palpation. Back: no CVA tenderness Pulses 2+ Musculoskeletal: full range of motion, normal strength, no joint deformities Extremities: no clubbing cyanosis or edema, Homan's sign negative  Neurologic: grossly nonfocal; Cranial nerves grossly wnl Psychologic: Normal mood and affect   ECG (independently read by me): Sinus rhythm at 60 bpm with mild sinus arrhythmia.  Normal intervals  September 2019 ECG (independently read by me): Sinus bradycardia at 51 bpm.  Mild RV conduction delay.  No significant ST changes.  PR interval 200 ms.  October 2018 ECG (independently read by me): Normal sinus rhythm with PACs.  PR interval 200 ms, QTc interval 412 ms.  April 2018 ECG (independently read by me): Normal sinus rhythm with sinus arrhythmia with an average heart rate at 60 bpm.  Normal intervals.  Small inferior Q waves.  February 2017 ECG (independently read by me): Sinus bradycardia with mild sinus arrhythmia at 58 bpm.  Mild RV conduction delay.  No significant ST segment changes.  July 2016 ECG (independently read by me): Sinus bradycardia with mild sinus arrhythmia, heart rate  ranging from 48-58.  Prior December 2014 ECG: Normal sinus rhythm at 54 beats per minute; normal intervals.  LABS: I personally reviewed the blood work  from [facility] done on 07/19/2015.   Total cholesterol 142, triglycerides 120, HDL 61, LDL 57.  I extensively reviewed laboratory from [facility] primary care from 08/29/2016  I personally reviewed the laboratory as noted above from February 2019  BMP Latest Ref Rng & Units 03/27/2016 03/26/2016 03/18/2016  Glucose 65 - 99 mg/dL 99 - 96  BUN 6 - 20 mg/dL 16 - 17  Creatinine 0.61 - 1.24 mg/dL 1.08 0.96 1.06  Sodium 135 - 145 mmol/L 137 - 135  Potassium 3.5 - 5.1 mmol/L 4.0 - 4.5  Chloride 101 - 111 mmol/L 108 - 103  CO2 22 - 32 mmol/L 24 - 24  Calcium 8.9 - 10.3 mg/dL 8.3(L) - 9.9   Hepatic Function Latest Ref Rng & Units 03/18/2016 01/15/2016 01/13/2016  Total Protein 6.5 - 8.1 g/dL 7.7 5.6(L) 5.8(L)  Albumin 3.5 - 5.0 g/dL 4.2 1.7(L) 1.5(L)  AST 15 - 41 U/L 22 53(H) 39  ALT 17 - 63 U/L 16(L) 56 60  Alk Phosphatase 38 - 126 U/L 70 116 128(H)  Total Bilirubin 0.3 - 1.2 mg/dL 0.6 0.7 0.5  Bilirubin, Direct 0.1 - 0.5 mg/dL - - -   CBC Latest Ref Rng & Units 03/27/2016 03/26/2016 03/18/2016  WBC 4.0 - 10.5 K/uL 7.3 8.0 6.8  Hemoglobin 13.0 - 17.0 g/dL 10.5(L) 11.4(L) 12.5(L)  Hematocrit 39.0 - 52.0 % 31.5(L) 33.4(L) 38.2(L)  Platelets 150 - 400 K/uL 175 184 226   Lab Results  Component Value Date   MCV 94.6 03/27/2016   MCV 93.8 03/26/2016   MCV 94.8 03/18/2016   Lab Results  Component Value Date   TSH 4.322 01/10/2016   Lab Results  Component Value Date   HGBA1C 7.2 (H) 01/10/2016     Lipid Panel     Component Value Date/Time   CHOL 145 01/18/2014 0803   CHOL 139 08/30/2012 0845   TRIG 70 01/18/2014 0803   TRIG 82 08/30/2012 0845   HDL 64 01/18/2014 0803  HDL 68 08/30/2012 0845   CHOLHDL 2.3 01/18/2014 0803   VLDL 14 01/18/2014 0803   LDLCALC 67 01/18/2014 0803   LDLCALC 55 08/30/2012 0845   IMPRESSION:  1. Coronary artery  disease involving native coronary artery of native heart without angina pectoris   2. Aortic valve stenosis, etiology of cardiac valve disease unspecified   3. Essential hypertension   4. Hyperlipidemia with target LDL less than 70   5. Carotid artery stenosis status post right carotid endarterectomy 03/26/2016     ASSESSMENT AND PLAN: Dillon [name] is a 85 year old gentleman who underwent emergent CABG revascularization surgery for life-threatening coronary anatomy in November 2009.  He continues to be without anginal symptomatology.  His blood pressure today is well controlled on his regimen consisting of amlodipine 5 mg, losartan 100 mg, Toprol-XL 50 mg daily.  He has mixed hyperlipidemia and most recent lipid studies were excellent on his current regimen of rosuvastatin 40 mg and 1400 mg daily of omega-3 fatty acids.  I last saw him, I discontinued his niacin for triglycerides remain normal at 73.  Recent hemoglobin A1c had improved to 6.2 from 6.4.  His thyroid function studies are normal on his current regimen of levothyroxine 112 mcg daily.  In addition to his CAD, has significant carotid disease and underwent successful right carotid endarterectomy by Dr. [name] his most recent carotid duplex imaging was reviewed which shows a patent right carotid endarterectomy site and 40 to 59% stenosis in the left carotid artery.  When I last saw him, an echo Doppler study in October 2018 showed slight progression of his aortic valve stenosis peak gradient that it increased to 38 from 25 and a mean gradient that it increased to 20 from 14.  Aortic valve area is 1.5 cm.  Clinically he is asymptomatic with reference to his aortic stenosis.  6 months, I am recommending here to go a follow-up echo Doppler study to reassess his aortic valve.  I will see him in the office that day when I also see his wife for further evaluation.  Time spent: 25 minutes  Troy Sine, MD, [facility]  04/06/2018 5:15 PM

## 2018-04-05 NOTE — Patient Instructions (Signed)

## 2018-04-06 ENCOUNTER — Encounter: Payer: Self-pay | Admitting: Cardiovascular Disease

## 2018-05-13 DIAGNOSIS — L570 Actinic keratosis: Secondary | ICD-10-CM | POA: Diagnosis not present

## 2018-05-13 DIAGNOSIS — Z85828 Personal history of other malignant neoplasm of skin: Secondary | ICD-10-CM | POA: Diagnosis not present

## 2018-06-01 ENCOUNTER — Other Ambulatory Visit: Payer: Self-pay | Admitting: Cardiovascular Disease

## 2018-08-09 ENCOUNTER — Other Ambulatory Visit: Payer: Self-pay

## 2018-08-09 MED ORDER — FOLIC ACID 1 MG PO TABS: 1.0000 mg | ORAL_TABLET | Freq: Every day | ORAL | 1 refills | Status: DC

## 2018-09-07 ENCOUNTER — Other Ambulatory Visit: Payer: Self-pay

## 2018-09-07 MED ORDER — METOPROLOL SUCCINATE ER 50 MG PO TB24: 50.0000 mg | ORAL_TABLET | Freq: Every day | ORAL | 1 refills | Status: DC

## 2018-09-21 ENCOUNTER — Other Ambulatory Visit: Payer: Self-pay

## 2018-09-21 MED ORDER — AMLODIPINE BESYLATE 5 MG PO TABS: ORAL_TABLET | ORAL | 2 refills | Status: DC

## 2018-10-25 ENCOUNTER — Encounter: Payer: Self-pay | Admitting: Cardiovascular Disease

## 2018-10-25 ENCOUNTER — Other Ambulatory Visit: Payer: Self-pay

## 2018-10-25 ENCOUNTER — Ambulatory Visit (INDEPENDENT_AMBULATORY_CARE_PROVIDER_SITE_OTHER): Payer: PPO | Admitting: Cardiovascular Disease

## 2018-10-25 VITALS — BP 134/64 | HR 54 | Ht 66.0 in | Wt 172.0 lb

## 2018-10-25 DIAGNOSIS — I2510 Atherosclerotic heart disease of native coronary artery without angina pectoris: Secondary | ICD-10-CM

## 2018-10-25 DIAGNOSIS — E785 Hyperlipidemia, unspecified: Secondary | ICD-10-CM

## 2018-10-25 DIAGNOSIS — I10 Essential (primary) hypertension: Secondary | ICD-10-CM | POA: Diagnosis not present

## 2018-10-25 DIAGNOSIS — I6523 Occlusion and stenosis of bilateral carotid arteries: Secondary | ICD-10-CM | POA: Diagnosis not present

## 2018-10-25 DIAGNOSIS — Z951 Presence of aortocoronary bypass graft: Secondary | ICD-10-CM

## 2018-10-25 DIAGNOSIS — I350 Nonrheumatic aortic (valve) stenosis: Secondary | ICD-10-CM

## 2018-10-25 NOTE — Patient Instructions (Signed)
Medication Instructions:  The current medical regimen is effective;  continue present plan and medications.  If you need a refill on your cardiac medications before your next appointment, please call your pharmacy.   Testing/Procedures: Echocardiogram (before 6 month appointment) - Your physician has requested that you have an echocardiogram. Echocardiography is a painless test that uses sound waves to create images of your heart. It provides your doctor with information about the size and shape of your heart and how well your heart's chambers and valves are working. This procedure takes approximately one hour. There are no restrictions for this procedure. This will be performed at our [facility] location - [address].   Follow-Up: At [facility], you and your health needs are our priority.  As part of our continuing mission to provide you with exceptional heart care, we have created designated Provider Care Teams.  These Care Teams include your primary Cardiologist (physician) and Advanced Practice Providers (APPs -  Physician Assistants and Nurse Practitioners) who all work together to provide you with the care you need, when you need it. You will need a follow up appointment in 6 months.  Please call our office 2 months in advance to schedule this appointment.  You may see Shelva Majestic, MD or one of the following Advanced Practice Providers on your designated Care Team: [place], [place] . Fabian Sharp, PA-C

## 2018-10-25 NOTE — Progress Notes (Signed)
Patient ID: Veldon Wager, male   DOB: 11/05/1933, 85 y.o.   MRN: [id number]     HPI: Cylan Borum, is a 85 y.o. male who presents to the office today for a 6 month follow-up cardiology evaluation.  In November 2009 Mr. Hairfield underwent emergent CABG revascularization surgery after cardiac catheterization revealed severe life-threatening anatomy with 95% ostial left main stenosis a 99% ostial RCA stenosis. Surgery was done by Dr. Cyndia Bent and he had a LIMA to the LAD, vein to the obtuse marginal, vein to the RCA. His last nuclear perfusion study in April 2012 continued to show normal perfusion.  Mr. Markovic has documented carotid disease. A carotid Doppler study  in November 2013 which showed at least 60% stenosis in his carotid arteries bilaterally which was slightly increased from previously. A f/u carotid evaluation last year demonstrated his peak right internal carotid systolic velocity 716 slightly increased from 240 in his left PICA systolic velocity up to 11 slightly increased from 200; 50-69% diameter reduction range bilaterally and was not significantly changed from one year ago.  On 12/01/2013 a follow-up study demonstrated a peak PICA velocity was now 967 with diastolic velocity at 42 and the right carotid and 218 and 61 in the left carotid.  He remains asymptomatic and these place him in the upper end of scale in the 50-69% range  Additional problems include mixed hyperlipidemia and hypertension.  He has been on Toprol-XL 50 mg and losartan 100 mg in addition to amlodipine 5 mg for blood pressure control.. In the past he had derived marked benefit with Niaspan  as well as [name]. Follow-up laboratory on his current dose of [name] and niacin 1000 mg  revealed a total cholesterol 145, triglycerides 70, HDL 64, and LDL 67.  His glucose was 111.  TSH 4.5.  He had normal renal function with a BUN of 19 and creatinine of 1.1.  He underwent echo Doppler study on 06/06/2014.  This showed an  ejection fraction at 55-60%.  There was a small systolic gradient across his aortic valve with moderately calcified leaflets.  Valve area was 1.6 cm.  He had a mean gradient of 11 and a peak gradient of 23 mm suggestive of mild aortic stenosis.  PA pressure was 31 mm.  A nuclear perfusion study which remained normal with an ejection fraction of 59% and evidence for normal perfusion.  He underwent a F/U 2-D echo Doppler study on 08/20/2015 which showed an EF of 60-65%.  The aortic valve was calcified and thickened with mildly restricted motion.  Peak and mean gradients were 25 and 14 mm consistent with mild aortic stenosis with a valve area of 1.42 cm.  I scheduled him for follow-up carotid duplex exam which was done in July 2017.  This suggested progression of his right internal carotid stenoses with velocity now at 465/131, which places him in the greater than 80% range.  He had stable left internal carotid velocities now or in the 60-79% range.  He had normal subclavian arteries bilaterally, and patent vertebral arteries with antegrade flow.  I referred him to Dr. [name] who felt that with his asymptomatic status and low risk assessment that he should undergo carotid endarterectomy.  He was referred to Dr. [name] for an office evaluation but this has not yet been scheduled.  He was hospitalized in December 2017 with community-acquired pneumonia of the left lower lung and possible empyema.  He underwent thoracentesis as well as a VATS procedure  with decortication and left empyema drainage.  His carotid surgery with Dr. [name] was ultimately postponed but was successfully done on 03/26/2016 with right carotid endarterectomy.  Intraoperative findings included a 95% stenosis.  He was discharged the following day.    I  saw him in October 2018 at which time he was doing well without chest pain, PND, orthopnea. He denies paresthesias, presyncope or syncope.  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  He had his fifth hole in one of his career on a par 3.  He recently had blood work done in [place] by his primary physician.  Renal function was stable, although potassium was upper normal at 5.2.  Lipid studies revealed cholesterol 148, triglycerides 134, HDL 52, LDL 69.  Thyroid function studies were normal.  Hemoglobin A1c was elevated at 6.4 and his estimated average glucose was 137.  He underwent a follow-up echo Doppler study in 11/04/2016.  This continued to show hyperdynamic LV function with an EF of 65-70% with moderate LVH.  Wall motion was normal.  His aortic valve gradient have slightly increased over the year, such that his peak gradient increased from 25 to 38 mm, and his mean gradient from 14 mm to 20 mmHg. Aortic valve area was 1.57 cm placing him still in the mild to mild to moderate AS category.    He had follow-up carotid imaging with Dr. [name] which showed a patent carotid endarterectomy site with no evidence for restenosis or hyperplasia.  Left internal carotid artery velocities suggested a 40 to 59% stenosis.  When I last saw him in September 2019 he denied any chest pain, presyncope or syncope or any symptoms of heart failure.  I reviewed recent laboratory done by his primary physician on March 04, 2017.  Chemistry was stable.  Total cholesterol was 125, triglycerides 73, HDL 49, and LDL 61.  TSH is 1.77.  Free T3 was 3.07 and free T4 1.0.  He was not anemic.  HbA1c 6.2  Mr. Gomes underwent an evaluation by Jory Sims, NP in December 2019 after developing some mild irregularity to his heart rate was noted by his primary physician.  He was felt to have occasional to frequent PACs and had bigeminal PACs.  This ultimately resolved on its own.  On April 01, 2018 he underwent a follow-up echo Doppler study which continued to show normal LV function with an EF of 55 to 60%.  There was mitral annular calcification.  His aortic stenosis was in the moderate range with a  mean gradient now at 25 and a peak instantantaneous gradient at 41 with a valve area of 1.3 cm.  When that evaluation he remained asymptomatic and specifically denied chest pain, PND, orthopnea, presyncope or syncope.    For the past 6 months, he is remained asymptomatic.  His wife had recently under gone knee surgery in August and they have been staying local instead of going down to [place].  He continues to deny any episodes of chest pain, change in exercise tolerance, palpitations, presyncope or syncope, or CHF symptomatology.  He is unaware of any palpitations.  He presents for follow-up evaluation.   Past Medical History:  Diagnosis Date  . CAD (coronary artery disease)   . Heart murmur   . History of hiatal hernia   . Hypertension   . Hypothyroidism   . Pneumonia 12/2015   hx  . S/P CABG x 3 12/17/07   LIMA to LAD,SVG to left C  Past Surgical History:  Procedure Laterality Date  . CORONARY ARTERY BYPASS GRAFT  12/17/07   LIMA to LAD,vein to obtuse marginal,vein to RCA  . EMPYEMA DRAINAGE Left 01/11/2016   Procedure: EMPYEMA DRAINAGE;  Surgeon: Ivin Poot, MD;  Location: [facility];  Service: Thoracic;  Laterality: Left;  . ENDARTERECTOMY Right 03/26/2016   Procedure: RIGHT CAROTID ENDARTERECTOMY;  Surgeon: Serafina Mitchell, MD;  Location: [facility];  Service: Vascular;  Laterality: Right;  . PATCH ANGIOPLASTY Right 03/26/2016   Procedure: PATCH ANGIOPLASTY USING Rueben Bash BIOLOGIC PATCH;  Surgeon: Serafina Mitchell, MD;  Location: [facility];  Service: Vascular;  Laterality: Right;  Marland Kitchen VIDEO ASSISTED THORACOSCOPY (VATS)/DECORTICATION Left 01/11/2016   Procedure: VIDEO ASSISTED THORACOSCOPY (VATS)/DECORTICATION;  Surgeon: Ivin Poot, MD;  Location: [facility];  Service: Thoracic;  Laterality: Left;    No Known Allergies  Current Outpatient Medications  Medication Sig Dispense Refill  . amLODipine (NORVASC) 5 MG tablet TAKE 1 TABLET DAILY. 90 tablet 2  . aspirin EC 81 MG tablet  Take 81 mg by mouth at bedtime.     . Cholecalciferol (VITAMIN D) 2000 units tablet Take 2,000 Units by mouth at bedtime.    . clopidogrel (PLAVIX) 75 MG tablet Take 1 tablet (75 mg total) by mouth daily. 90 tablet 3  . fexofenadine (ALLEGRA) 180 MG tablet Take 180 mg by mouth daily.    . folic acid (FOLVITE) 1 MG tablet Take 1 tablet (1 mg total) by mouth daily. 90 tablet 1  . levothyroxine (SYNTHROID, LEVOTHROID) 112 MCG tablet Take 112 mcg by mouth daily.    Marland Kitchen losartan (COZAAR) 100 MG tablet Take 1 tablet (100 mg total) by mouth daily. 90 tablet 3  . metoprolol succinate (TOPROL-XL) 50 MG 24 hr tablet Take 1 tablet (50 mg total) by mouth daily. Take with or immediately following a meal. 90 tablet 1  . mupirocin cream (BACTROBAN) 2 % Apply 1 application topically daily as needed (after skin treatments).     . Omega-3 1400 MG CAPS Take by mouth daily.    . rosuvastatin (CRESTOR) 40 MG tablet Take 1 tablet by mouth every day 90 tablet 3  . terbinafine (LAMISIL) 1 % cream Apply 1 application topically daily as needed (rash).      No current facility-administered medications for this visit.     Socially he is married. There are no children. He does remain active. He does walk. There is no tobacco use. He does drink occasional alcohol.  ROS General: Negative; No fevers, chills, or night sweats;  HEENT: Negative; No changes in vision or hearing, sinus congestion, difficulty swallowing Pulmonary: Negative; No cough, wheezing, shortness of breath, hemoptysis Cardiovascular:  See HPI GI: Negative; No nausea, vomiting, diarrhea, or abdominal pain GU: Negative; No dysuria, hematuria, or difficulty voiding Musculoskeletal: Negative; no myalgias, joint pain, or weakness Hematologic/Oncology: Negative; no easy bruising, bleeding Endocrine: Positive for hypothyroidism on Synthroid replacement. Neuro: Negative; no changes in balance, headaches Skin: Negative; No rashes or skin lesions Psychiatric:  Negative; No behavioral problems, depression Sleep: Negative; No snoring, daytime sleepiness, hypersomnolence, bruxism, restless legs, hypnogognic hallucinations, no cataplexy Other comprehensive 14 point system review is negative.   PE BP 134/64   Pulse (!) 54   Ht '5\' 6"'$  (1.676 m)   Wt 172 lb (78 kg)   SpO2 97%   BMI 27.76 kg/m    Repeat blood pressure by me was 134/62  Wt Readings from Last 3 Encounters:  10/25/18 172 lb (78  kg)  04/05/18 175 lb 3.2 oz (79.5 kg)  01/11/18 172 lb 12.8 oz (78.4 kg)   General: Alert, oriented, no distress.  Skin: normal turgor, no rashes, warm and dry HEENT: Normocephalic, atraumatic. Pupils equal round and reactive to light; sclera anicteric; extraocular muscles intact; mild left eyelid erythema;  Nose without nasal septal hypertrophy Mouth/Parynx benign; Mallinpatti scale 3 Neck: Transmitted murmur to carotids.  Status post right endarterectomy. Lungs: clear to ausculatation and percussion; no wheezing or rales Chest wall: without tenderness to palpitation Heart: PMI not displaced, RRR, s1 s2 normal, 2/6 mid peaking systolic murmur in the aortic area; no diastolic murmur, no rubs, gallops, thrills, or heaves Abdomen: soft, nontender; no hepatosplenomehaly, BS+; abdominal aorta nontender and not dilated by palpation. Back: no CVA tenderness Pulses 2+ Musculoskeletal: full range of motion, normal strength, no joint deformities Extremities: no clubbing cyanosis or edema, Homan's sign negative  Neurologic: grossly nonfocal; Cranial nerves grossly wnl Psychologic: Normal mood and affect   ECG (independently read by me): Sinus bradycardia 54 bpm, isolated PAC, RV conduction delay/incomplete right bundle branch block.  PR interval 202 ms.  March 2020 ECG (independently read by me): Sinus rhythm at 60 bpm with mild sinus arrhythmia.  Normal intervals  September 2019 ECG (independently read by me): Sinus bradycardia at 51 bpm.  Mild RV conduction  delay.  No significant ST changes.  PR interval 200 ms.  October 2018 ECG (independently read by me): Normal sinus rhythm with PACs.  PR interval 200 ms, QTc interval 412 ms.  April 2018 ECG (independently read by me): Normal sinus rhythm with sinus arrhythmia with an average heart rate at 60 bpm.  Normal intervals.  Small inferior Q waves.  February 2017 ECG (independently read by me): Sinus bradycardia with mild sinus arrhythmia at 58 bpm.  Mild RV conduction delay.  No significant ST segment changes.  July 2016 ECG (independently read by me): Sinus bradycardia with mild sinus arrhythmia, heart rate ranging from 48-58.  Prior December 2014 ECG: Normal sinus rhythm at 54 beats per minute; normal intervals.  LABS: I personally reviewed the blood work  from [facility] done on 07/19/2015.   Total cholesterol 142, triglycerides 120, HDL 61, LDL 57.  I extensively reviewed laboratory from [facility] primary care from 08/29/2016  I personally reviewed the laboratory as noted above from February 2019 Total cholesterol 125, triglycerides 73, HDL 49, LDL 61.  BMP Latest Ref Rng & Units 03/27/2016 03/26/2016 03/18/2016  Glucose 65 - 99 mg/dL 99 - 96  BUN 6 - 20 mg/dL 16 - 17  Creatinine 0.61 - 1.24 mg/dL 1.08 0.96 1.06  Sodium 135 - 145 mmol/L 137 - 135  Potassium 3.5 - 5.1 mmol/L 4.0 - 4.5  Chloride 101 - 111 mmol/L 108 - 103  CO2 22 - 32 mmol/L 24 - 24  Calcium 8.9 - 10.3 mg/dL 8.3(L) - 9.9   Hepatic Function Latest Ref Rng & Units 03/18/2016 01/15/2016 01/13/2016  Total Protein 6.5 - 8.1 g/dL 7.7 5.6(L) 5.8(L)  Albumin 3.5 - 5.0 g/dL 4.2 1.7(L) 1.5(L)  AST 15 - 41 U/L 22 53(H) 39  ALT 17 - 63 U/L 16(L) 56 60  Alk Phosphatase 38 - 126 U/L 70 116 128(H)  Total Bilirubin 0.3 - 1.2 mg/dL 0.6 0.7 0.5  Bilirubin, Direct 0.1 - 0.5 mg/dL - - -   CBC Latest Ref Rng & Units 03/27/2016 03/26/2016 03/18/2016  WBC 4.0 - 10.5 K/uL 7.3 8.0 6.8  Hemoglobin 13.0 - 17.0 g/dL 10.5(L)  11.4(L) 12.5(L)  Hematocrit 39.0 -  52.0 % 31.5(L) 33.4(L) 38.2(L)  Platelets 150 - 400 K/uL 175 184 226   Lab Results  Component Value Date   MCV 94.6 03/27/2016   MCV 93.8 03/26/2016   MCV 94.8 03/18/2016   Lab Results  Component Value Date   TSH 4.322 01/10/2016   Lab Results  Component Value Date   HGBA1C 7.2 (H) 01/10/2016     Lipid Panel     Component Value Date/Time   CHOL 145 01/18/2014 0803   CHOL 139 08/30/2012 0845   TRIG 70 01/18/2014 0803   TRIG 82 08/30/2012 0845   HDL 64 01/18/2014 0803   HDL 68 08/30/2012 0845   CHOLHDL 2.3 01/18/2014 0803   VLDL 14 01/18/2014 0803   LDLCALC 67 01/18/2014 0803   LDLCALC 55 08/30/2012 0845   IMPRESSION:  1. Aortic valve stenosis, etiology of cardiac valve disease unspecified   2. Coronary artery disease involving native coronary artery of native heart without angina pectoris   3. Hx of CABG: November 2009   4. Hyperlipidemia with target LDL less than 70   5. Essential hypertension   6. Carotid artery stenosis status post right carotid endarterectomy 03/26/2016     ASSESSMENT AND PLAN: Knoth is an 85 year old gentleman who underwent emergent CABG revascularization surgery for life-threatening coronary anatomy in November 2009.  He has a history of peripheral vascular disease and underwent successful right carotid endarterectomy by Dr. [name] most recent carotid duplex imaging showing 59% stenoses in his carotids bilaterally.  Laser continues to be without anginal symptomatology.  He denies any change in exercise tolerance.  His blood pressure today is stable on his current medical regimen consisting of amlodipine 5 mg, losartan 100 mg, and Toprol-XL 50 mg daily.  He continues to be on aspirin and Plavix for dual antiplatelet therapy.  He is on rosuvastatin 40 mg daily for hyperlipidemia and lipid studies have been stable.  He gets his laboratory through [facility] in [place] with Henriette Combs, [place].  He has documented  moderate aortic valve stenosis and his most recent echo Doppler study in March 2020 showed slight progression from previously with a mean gradient of 25, peak instantaneous gradient of 45, and estimated aortic valve area of 1.3 cm.  He continues to be asymptomatic with reference to his aortic stenosis.  I again spent time with him discussing aortic stenosis symptomatology.  His ECG reveals stable rhythm with sinus bradycardia and an isolated PVC.  He needs to be on levothyroxine for hypothyroidism at 112 mcg daily.  Review his hemoglobin A1c has suggested prediabetes.  He will be having follow-up laboratory with his primary provider.  In February/March 2021 he will undergo a 1 year follow-up echo Doppler study and I will see him back in the office for follow-up evaluation.  Time spent: 25 minutes  Troy Sine, MD, [facility]  10/25/2018 9:30 AM

## 2018-12-16 DIAGNOSIS — L570 Actinic keratosis: Secondary | ICD-10-CM | POA: Diagnosis not present

## 2018-12-16 DIAGNOSIS — L821 Other seborrheic keratosis: Secondary | ICD-10-CM | POA: Diagnosis not present

## 2018-12-16 DIAGNOSIS — Z85828 Personal history of other malignant neoplasm of skin: Secondary | ICD-10-CM | POA: Diagnosis not present

## 2018-12-16 DIAGNOSIS — D485 Neoplasm of uncertain behavior of skin: Secondary | ICD-10-CM | POA: Diagnosis not present

## 2018-12-16 DIAGNOSIS — D225 Melanocytic nevi of trunk: Secondary | ICD-10-CM | POA: Diagnosis not present

## 2018-12-16 DIAGNOSIS — D1801 Hemangioma of skin and subcutaneous tissue: Secondary | ICD-10-CM | POA: Diagnosis not present

## 2018-12-16 DIAGNOSIS — L814 Other melanin hyperpigmentation: Secondary | ICD-10-CM | POA: Diagnosis not present

## 2019-02-10 ENCOUNTER — Other Ambulatory Visit: Payer: Self-pay | Admitting: Cardiovascular Disease

## 2019-02-11 ENCOUNTER — Other Ambulatory Visit: Payer: Self-pay | Admitting: Cardiovascular Disease

## 2019-02-18 ENCOUNTER — Telehealth (HOSPITAL_COMMUNITY): Payer: Self-pay

## 2019-02-18 NOTE — Telephone Encounter (Signed)

## 2019-02-19 ENCOUNTER — Ambulatory Visit: Payer: PPO | Attending: Internal Medicine

## 2019-02-19 DIAGNOSIS — Z23 Encounter for immunization: Secondary | ICD-10-CM | POA: Insufficient documentation

## 2019-02-19 NOTE — Progress Notes (Signed)
   Covid-19 Vaccination Clinic  Name:  Dillon [name]    MRN: [id number] DOB: Jul 21, 1933  02/19/2019  [name] was observed post Covid-19 immunization for 15 minutes without incidence. He was provided with Vaccine Information Sheet and instruction to access the V-Safe system.   [name] was instructed to call 911 with any severe reactions post vaccine: Marland Kitchen Difficulty breathing  . Swelling of your face and throat  . A fast heartbeat  . A bad rash all over your body  . Dizziness and weakness    Immunizations Administered    Name Date Dose VIS Date Route   Pfizer COVID-19 Vaccine 02/19/2019  2:03 PM 0.3 mL 01/07/2019 Intramuscular   Manufacturer: [place]   Lot: [id number]   [place]: [id number]

## 2019-02-21 ENCOUNTER — Ambulatory Visit (INDEPENDENT_AMBULATORY_CARE_PROVIDER_SITE_OTHER): Payer: PPO | Admitting: Surgery

## 2019-02-21 ENCOUNTER — Encounter: Payer: Self-pay | Admitting: Surgery

## 2019-02-21 ENCOUNTER — Other Ambulatory Visit: Payer: Self-pay

## 2019-02-21 ENCOUNTER — Ambulatory Visit (HOSPITAL_COMMUNITY)
Admission: RE | Admit: 2019-02-21 | Discharge: 2019-02-21 | Disposition: A | Discharge status: Home / Self Care | Payer: PPO | Source: Ambulatory Visit | Attending: Surgery | Admitting: Surgery

## 2019-02-21 VITALS — BP 148/69 | HR 51 | Temp 97.5°F | Resp 20 | Ht 66.0 in | Wt 175.7 lb

## 2019-02-21 DIAGNOSIS — I6523 Occlusion and stenosis of bilateral carotid arteries: Secondary | ICD-10-CM

## 2019-02-21 NOTE — Progress Notes (Signed)
[facility]ist [facility]  Patient name: Dillon [name] MRN: [id number] DOB: 1933-11-30 Sex: male   REASON FOR VISIT:    Follow up  HISOTRY OF PRESENT ILLNESS:    Dillon [name] is a 86 y.o. male who is status post right carotid endarterectomy on 03/26/2016 for asymptomatic stenosis.  Intraoperative findings included a heavily calcified lesion with approximately 95% stenosis.  There was significant redundancy in the internal carotid artery and therefore resection with primary anastomosis of the internal carotid artery was performed.  He denies any neurologic symptoms today.  Specifically, he denies numbness or weakness in either extremity.  He denies slurred speech.  He denies amaurosis fugax.  He continues to take his ARB for hypertension and a statin for hypercholesterolemia.  He got his coronavirus vaccine yesterday  PAST MEDICAL HISTORY:   Past Medical History:  Diagnosis Date  . CAD (coronary artery disease)   . Heart murmur   . History of hiatal hernia   . Hypertension   . Hypothyroidism   . Pneumonia 12/2015   hx  . S/P CABG x 3 12/17/07   LIMA to LAD,SVG to left C     FAMILY HISTORY:   Family History  Problem Relation Age of Onset  . Heart attack Father     SOCIAL HISTORY:   Social History   Tobacco Use  . Smoking status: Former Smoker    Types: Cigarettes    Quit date: 01/28/1971    Years since quitting: 48.0  . Smokeless tobacco: Never Used  Substance Use Topics  . Alcohol use: Yes    Comment: socially     ALLERGIES:   No Known Allergies   CURRENT MEDICATIONS:   Current Outpatient Medications  Medication Sig Dispense Refill  . amLODipine (NORVASC) 5 MG tablet TAKE 1 TABLET DAILY. 90 tablet 2  . aspirin EC 81 MG tablet Take 81 mg by mouth at bedtime.     . Cholecalciferol (VITAMIN D) 2000 units tablet Take 2,000 Units by mouth at bedtime.    . clopidogrel (PLAVIX) 75 MG tablet Take 1 tablet by  mouth daily 90 tablet 2  . fexofenadine (ALLEGRA) 180 MG tablet Take 180 mg by mouth daily.    . folic acid (FOLVITE) 1 MG tablet Take 1 tablet (1 mg total) by mouth daily. 90 tablet 1  . levothyroxine (SYNTHROID, LEVOTHROID) 112 MCG tablet Take 112 mcg by mouth daily.    Marland Kitchen losartan (COZAAR) 100 MG tablet Take 1 tablet (100 mg total) by mouth daily. 90 tablet 3  . metoprolol succinate (TOPROL-XL) 50 MG 24 hr tablet Take 1 tablet (50 mg total) by mouth daily. Take with or immediately following a meal. 90 tablet 1  . mupirocin cream (BACTROBAN) 2 % Apply 1 application topically daily as needed (after skin treatments).     . Omega-3 1400 MG CAPS Take by mouth daily.    . rosuvastatin (CRESTOR) 40 MG tablet Take 1 tablet by mouth every day 90 tablet 3  . terbinafine (LAMISIL) 1 % cream Apply 1 application topically daily as needed (rash).      No current facility-administered medications for this visit.    REVIEW OF SYSTEMS:   [X]  denotes positive finding, [ ]  denotes negative finding Cardiac  Comments:  Chest pain or chest pressure:    Shortness of breath upon exertion:    Short of breath when lying flat:    Irregular heart rhythm:        Vascular  Pain in calf, thigh, or hip brought on by ambulation:    Pain in feet at night that wakes you up from your sleep:     Blood clot in your veins:    Leg swelling:         Pulmonary    Oxygen at home:    Productive cough:     Wheezing:         Neurologic    Sudden weakness in arms or legs:     Sudden numbness in arms or legs:     Sudden onset of difficulty speaking or slurred speech:    Temporary loss of vision in one eye:     Problems with dizziness:         Gastrointestinal    Blood in stool:     Vomited blood:         Genitourinary    Burning when urinating:     Blood in urine:        Psychiatric    Major depression:         Hematologic    Bleeding problems:    Problems with blood clotting too easily:        Skin      Rashes or ulcers:        Constitutional    Fever or chills:      PHYSICAL EXAM:   Vitals:   02/21/19 1134 02/21/19 1137  BP: (!) 146/63 (!) 148/69  Pulse: (!) 51   Resp: 20   Temp: (!) 97.5 F (36.4 C)   SpO2: 98%   Weight: 175 lb 11.2 oz (79.7 kg)   Height: 5\' 6"  (1.676 m)     GENERAL: The patient is a well-nourished male, in no acute distress. The vital signs are documented above. CARDIAC: There is a regular rate and rhythm.  PULMONARY: Non-labored respirations You MUSCULOSKELETAL: There are no major deformities or cyanosis. NEUROLOGIC: No focal weakness or paresthesias are detected. SKIN: There are no ulcers or rashes noted. PSYCHIATRIC: The patient has a normal affect.  STUDIES:   I have ordered and reviewed the following: Right Carotid: Velocities in the right ICA are consistent with a 1-39% stenosis.  Left Carotid: Velocities in the left ICA are consistent with a 1-39% stenosis;               however, velocities may be underestimated due to shadowing and               vessel tortuosity. Visualization of the proximal portion of the               internal carotid artery was limited.  Vertebrals:  Bilateral vertebral arteries demonstrate antegrade flow. Subclavians: Normal flow hemodynamics were seen in bilateral subclavian              arteries.  MEDICAL ISSUES:   Carotid stenosis: The patient remains asymptomatic.  Ultrasound today shows no significant stenosis bilaterally.  He will continue with his current medical therapy and follow-up in 1 year with repeat carotid duplex.    Leia Alf, MD, FACS [facility] [phone] Pager [phone]

## 2019-02-22 ENCOUNTER — Other Ambulatory Visit: Payer: Self-pay | Admitting: *Deleted

## 2019-02-22 DIAGNOSIS — I6523 Occlusion and stenosis of bilateral carotid arteries: Secondary | ICD-10-CM

## 2019-03-08 ENCOUNTER — Other Ambulatory Visit: Payer: Self-pay | Admitting: Cardiovascular Disease

## 2019-03-08 MED ORDER — FOLIC ACID 1 MG PO TABS: 1.0000 mg | ORAL_TABLET | Freq: Every day | ORAL | 2 refills | Status: DC

## 2019-03-08 NOTE — Telephone Encounter (Signed)
*  STAT* If patient is at the pharmacy, call can be transferred to refill team.   1. Which medications need to be refilled? (please list name of each medication and dose if known) folic acid (FOLVITE) 1 MG tablet   2. Which pharmacy/location (including street and city if local pharmacy) is medication to be sent to?[facility] - [place], [place]  3. Do they need a 30 day or 90 day supply? 90 day supply

## 2019-03-08 NOTE — Telephone Encounter (Signed)
Rx has been sent to the pharmacy electronically. ° °

## 2019-03-12 ENCOUNTER — Ambulatory Visit: Payer: PPO | Attending: Internal Medicine

## 2019-03-12 DIAGNOSIS — Z23 Encounter for immunization: Secondary | ICD-10-CM | POA: Insufficient documentation

## 2019-03-12 NOTE — Progress Notes (Signed)
   Covid-19 Vaccination Clinic  Name:  Dillon [name]    MRN: [id number] DOB: 10-30-1933  03/12/2019  [name] was observed post Covid-19 immunization for 15 minutes without incidence. He was provided with Vaccine Information Sheet and instruction to access the V-Safe system.   [name] was instructed to call 911 with any severe reactions post vaccine: Marland Kitchen Difficulty breathing  . Swelling of your face and throat  . A fast heartbeat  . A bad rash all over your body  . Dizziness and weakness    Immunizations Administered    Name Date Dose VIS Date Route   Pfizer COVID-19 Vaccine 03/12/2019  1:00 PM 0.3 mL 01/07/2019 Intramuscular   Manufacturer: [place]   Lot: [id number]   [place]: [id number]

## 2019-03-14 ENCOUNTER — Other Ambulatory Visit: Payer: Self-pay | Admitting: Cardiovascular Disease

## 2019-03-22 DIAGNOSIS — E039 Hypothyroidism, unspecified: Secondary | ICD-10-CM | POA: Diagnosis not present

## 2019-03-22 DIAGNOSIS — E785 Hyperlipidemia, unspecified: Secondary | ICD-10-CM | POA: Diagnosis not present

## 2019-03-22 DIAGNOSIS — I10 Essential (primary) hypertension: Secondary | ICD-10-CM | POA: Diagnosis not present

## 2019-03-22 DIAGNOSIS — R7309 Other abnormal glucose: Secondary | ICD-10-CM | POA: Diagnosis not present

## 2019-03-31 ENCOUNTER — Other Ambulatory Visit: Payer: Self-pay | Admitting: Cardiovascular Disease

## 2019-04-13 ENCOUNTER — Other Ambulatory Visit: Payer: Self-pay

## 2019-04-13 MED ORDER — ROSUVASTATIN CALCIUM 40 MG PO TABS: ORAL_TABLET | ORAL | 3 refills | Status: DC

## 2019-04-18 ENCOUNTER — Other Ambulatory Visit: Payer: Self-pay

## 2019-04-18 ENCOUNTER — Ambulatory Visit (HOSPITAL_COMMUNITY): Payer: PPO | Attending: Cardiovascular Disease

## 2019-04-18 DIAGNOSIS — I10 Essential (primary) hypertension: Secondary | ICD-10-CM | POA: Insufficient documentation

## 2019-04-18 DIAGNOSIS — E785 Hyperlipidemia, unspecified: Secondary | ICD-10-CM | POA: Insufficient documentation

## 2019-04-18 DIAGNOSIS — I2510 Atherosclerotic heart disease of native coronary artery without angina pectoris: Secondary | ICD-10-CM | POA: Insufficient documentation

## 2019-04-25 ENCOUNTER — Other Ambulatory Visit: Payer: Self-pay | Admitting: Cardiovascular Disease

## 2019-04-25 DIAGNOSIS — R1012 Left upper quadrant pain: Secondary | ICD-10-CM | POA: Diagnosis not present

## 2019-04-25 DIAGNOSIS — K8020 Calculus of gallbladder without cholecystitis without obstruction: Secondary | ICD-10-CM | POA: Diagnosis not present

## 2019-05-11 ENCOUNTER — Encounter: Payer: Self-pay | Admitting: Cardiovascular Disease

## 2019-05-11 ENCOUNTER — Other Ambulatory Visit: Payer: Self-pay

## 2019-05-11 ENCOUNTER — Ambulatory Visit: Payer: PPO | Admitting: Cardiovascular Disease

## 2019-05-11 DIAGNOSIS — Z951 Presence of aortocoronary bypass graft: Secondary | ICD-10-CM | POA: Diagnosis not present

## 2019-05-11 DIAGNOSIS — I2510 Atherosclerotic heart disease of native coronary artery without angina pectoris: Secondary | ICD-10-CM

## 2019-05-11 DIAGNOSIS — E785 Hyperlipidemia, unspecified: Secondary | ICD-10-CM | POA: Diagnosis not present

## 2019-05-11 DIAGNOSIS — I519 Heart disease, unspecified: Secondary | ICD-10-CM | POA: Diagnosis not present

## 2019-05-11 DIAGNOSIS — I6523 Occlusion and stenosis of bilateral carotid arteries: Secondary | ICD-10-CM | POA: Diagnosis not present

## 2019-05-11 DIAGNOSIS — I10 Essential (primary) hypertension: Secondary | ICD-10-CM

## 2019-05-11 DIAGNOSIS — I350 Nonrheumatic aortic (valve) stenosis: Secondary | ICD-10-CM | POA: Diagnosis not present

## 2019-05-11 DIAGNOSIS — I5189 Other ill-defined heart diseases: Secondary | ICD-10-CM

## 2019-05-11 NOTE — Progress Notes (Signed)
Patient ID: Lemoyne Nestor, male   DOB: [birth year], 86 y.o.   MRN: [id number]     HPI: Aziel Morgan, is a 86 y.o. male who presents to the office today for a 7 month follow-up cardiology evaluation.  In November 2009 Mr. Brassell underwent emergent CABG revascularization surgery after cardiac catheterization revealed severe life-threatening anatomy with 95% ostial left main stenosis a 99% ostial RCA stenosis. Surgery was done by Dr. Cyndia Bent and he had a LIMA to the LAD, vein to the obtuse marginal, vein to the RCA. His last nuclear perfusion study in April 2012 continued to show normal perfusion.  Mr. Lamping has documented carotid disease. A carotid Doppler study  in November 2013 which showed at least 60% stenosis in his carotid arteries bilaterally which was slightly increased from previously. A f/u carotid evaluation last year demonstrated his peak right internal carotid systolic velocity 517 slightly increased from 240 in his left PICA systolic velocity up to 11 slightly increased from 200; 50-69% diameter reduction range bilaterally and was not significantly changed from one year ago.  On 12/01/2013 a follow-up study demonstrated a peak PICA velocity was now 001 with diastolic velocity at 42 and the right carotid and 218 and 61 in the left carotid.  He remains asymptomatic and these place him in the upper end of scale in the 50-69% range  Additional problems include mixed hyperlipidemia and hypertension.  He has been on Toprol-XL 50 mg and losartan 100 mg in addition to amlodipine 5 mg for blood pressure control.. In the past he had derived marked benefit with Niaspan  as well as [name]. Follow-up laboratory on his current dose of [name] and niacin 1000 mg  revealed a total cholesterol 145, triglycerides 70, HDL 64, and LDL 67.  His glucose was 111.  TSH 4.5.  He had normal renal function with a BUN of 19 and creatinine of 1.1.  He underwent echo Doppler study on 06/06/2014.  This showed an  ejection fraction at 55-60%.  There was a small systolic gradient across his aortic valve with moderately calcified leaflets.  Valve area was 1.6 cm.  He had a mean gradient of 11 and a peak gradient of 23 mm suggestive of mild aortic stenosis.  PA pressure was 31 mm.  A nuclear perfusion study which remained normal with an ejection fraction of 59% and evidence for normal perfusion.  He underwent a F/U 2-D echo Doppler study on 08/20/2015 which showed an EF of 60-65%.  The aortic valve was calcified and thickened with mildly restricted motion.  Peak and mean gradients were 25 and 14 mm consistent with mild aortic stenosis with a valve area of 1.42 cm.  I scheduled him for follow-up carotid duplex exam which was done in July 2017.  This suggested progression of his right internal carotid stenoses with velocity now at 465/131, which places him in the greater than 80% range.  He had stable left internal carotid velocities now or in the 60-79% range.  He had normal subclavian arteries bilaterally, and patent vertebral arteries with antegrade flow.  I referred him to Dr. [name] who felt that with his asymptomatic status and low risk assessment that he should undergo carotid endarterectomy.  He was referred to Dr. [name] for an office evaluation but this has not yet been scheduled.  He was hospitalized in December 2017 with community-acquired pneumonia of the left lower lung and possible empyema.  He underwent thoracentesis as well as a VATS procedure  with decortication and left empyema drainage.  His carotid surgery with Dr. [name] was ultimately postponed but was successfully done on 03/26/2016 with right carotid endarterectomy.  Intraoperative findings included a 95% stenosis.  He was discharged the following day.    I  saw him in October 2018 at which time he was doing well without chest pain, PND, orthopnea. He denies paresthesias, presyncope or syncope.  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  He had his fifth hole in one of his career on a par 3.  He recently had blood work done in [place] by his primary physician.  Renal function was stable, although potassium was upper normal at 5.2.  Lipid studies revealed cholesterol 148, triglycerides 134, HDL 52, LDL 69.  Thyroid function studies were normal.  Hemoglobin A1c was elevated at 6.4 and his estimated average glucose was 137.  He underwent a follow-up echo Doppler study in 11/04/2016.  This continued to show hyperdynamic LV function with an EF of 65-70% with moderate LVH.  Wall motion was normal.  His aortic valve gradient have slightly increased over the year, such that his peak gradient increased from 25 to 38 mm, and his mean gradient from 14 mm to 20 mmHg. Aortic valve area was 1.57 cm placing him still in the mild to mild to moderate AS category.    He had follow-up carotid imaging with Dr. [name] which showed a patent carotid endarterectomy site with no evidence for restenosis or hyperplasia.  Left internal carotid artery velocities suggested a 40 to 59% stenosis.  When I last saw him in September 2019 he denied any chest pain, presyncope or syncope or any symptoms of heart failure.  I reviewed recent laboratory done by his primary physician on March 04, 2017.  Chemistry was stable.  Total cholesterol was 125, triglycerides 73, HDL 49, and LDL 61.  TSH is 1.77.  Free T3 was 3.07 and free T4 1.0.  He was not anemic.  HbA1c 6.2  Mr. Usery underwent an evaluation by Jory Sims, NP in December 2019 after developing some mild irregularity to his heart rate was noted by his primary physician.  He was felt to have occasional to frequent PACs and had bigeminal PACs.  This ultimately resolved on its own.  On April 01, 2018 he underwent a follow-up echo Doppler study which continued to show normal LV function with an EF of 55 to 60%.  There was mitral annular calcification.  His aortic stenosis was in the moderate range with a  mean gradient now at 25 and a peak instantantaneous gradient at 41 with a valve area of 1.3 cm.  When that evaluation he remained asymptomatic and specifically denied chest pain, PND, orthopnea, presyncope or syncope.   I last saw him in September 2020 at which time he remained asymptomatic.  His wife had recently under gone knee surgery in August and they have been staying local instead of going down to [place].  He continues to deny any episodes of chest pain, change in exercise tolerance, palpitations, presyncope or syncope, or CHF symptomatology.  He is unaware of any palpitations.    Mr. Camille underwent a 1 year follow-up echo Doppler study on April 18, 2019.  LV function remains normal with EF 60 to 65%.  There was grade 2 diastolic dysfunction and mild LVH.  He had moderately elevated pulmonary artery systolic pressure at 44 mm.  There was moderate aortic stenosis with a mean gradient of 24  and peak instantaneous gradient at 39 mmHg.  Aortic valve area was 1.04 cm.  There was mild aortic insufficiency.  Presently, Mr. Vandyke remains asymptomatic.  He denies chest pain or shortness of breath.  He denies significant dyspnea with activity.  He had laboratory checked in February 2021 which showed a total cholesterol 138 HDL 57 LDL 62 and triglycerides 94.  He presents for evaluation.  Past Medical History:  Diagnosis Date  . CAD (coronary artery disease)   . Heart murmur   . History of hiatal hernia   . Hypertension   . Hypothyroidism   . Pneumonia 12/2015   hx  . S/P CABG x 3 12/17/07   LIMA to LAD,SVG to left C    Past Surgical History:  Procedure Laterality Date  . CORONARY ARTERY BYPASS GRAFT  12/17/07   LIMA to LAD,vein to obtuse marginal,vein to RCA  . EMPYEMA DRAINAGE Left 01/11/2016   Procedure: EMPYEMA DRAINAGE;  Surgeon: Ivin Poot, MD;  Location: [facility];  Service: Thoracic;  Laterality: Left;  . ENDARTERECTOMY Right 03/26/2016   Procedure: RIGHT CAROTID  ENDARTERECTOMY;  Surgeon: Serafina Mitchell, MD;  Location: [facility];  Service: Vascular;  Laterality: Right;  . PATCH ANGIOPLASTY Right 03/26/2016   Procedure: PATCH ANGIOPLASTY USING Rueben Bash BIOLOGIC PATCH;  Surgeon: Serafina Mitchell, MD;  Location: [facility];  Service: Vascular;  Laterality: Right;  Marland Kitchen VIDEO ASSISTED THORACOSCOPY (VATS)/DECORTICATION Left 01/11/2016   Procedure: VIDEO ASSISTED THORACOSCOPY (VATS)/DECORTICATION;  Surgeon: Ivin Poot, MD;  Location: [facility];  Service: Thoracic;  Laterality: Left;    No Known Allergies  Current Outpatient Medications  Medication Sig Dispense Refill  . aspirin EC 81 MG tablet Take 81 mg by mouth at bedtime.     . Cholecalciferol (VITAMIN D) 2000 units tablet Take 2,000 Units by mouth at bedtime.    . clopidogrel (PLAVIX) 75 MG tablet Take 1 tablet by mouth daily 90 tablet 2  . fexofenadine (ALLEGRA) 180 MG tablet Take 180 mg by mouth daily.    . folic acid (FOLVITE) 1 MG tablet Take 1 tablet (1 mg total) by mouth daily. 90 tablet 2  . levothyroxine (SYNTHROID, LEVOTHROID) 112 MCG tablet Take 112 mcg by mouth daily.    Marland Kitchen losartan (COZAAR) 100 MG tablet Take 1 tablet by mouth (100 mg total) daily 90 tablet 2  . metoprolol succinate (TOPROL-XL) 50 MG 24 hr tablet Take 1 tablet by mouth once daily with or immediately following a meal 90 tablet 2  . mupirocin cream (BACTROBAN) 2 % Apply 1 application topically daily as needed (after skin treatments).     . Omega-3 1400 MG CAPS Take by mouth daily.    . rosuvastatin (CRESTOR) 40 MG tablet Take 1 tablet by mouth every day 90 tablet 3  . terbinafine (LAMISIL) 1 % cream Apply 1 application topically daily as needed (rash).     Marland Kitchen amLODipine (NORVASC) 5 MG tablet TAKE 1 TABLET DAILY. 90 tablet 1   No current facility-administered medications for this visit.    Socially he is married. There are no children. He does remain active. He does walk. There is no tobacco use. He does drink occasional  alcohol.  ROS General: Negative; No fevers, chills, or night sweats;  HEENT: Negative; No changes in vision or hearing, sinus congestion, difficulty swallowing Pulmonary: Negative; No cough, wheezing, shortness of breath, hemoptysis Cardiovascular:  See HPI GI: Negative; No nausea, vomiting, diarrhea, or abdominal pain GU: Negative; No dysuria, hematuria, or  difficulty voiding Musculoskeletal: Negative; no myalgias, joint pain, or weakness Hematologic/Oncology: Negative; no easy bruising, bleeding Endocrine: Positive for hypothyroidism on Synthroid replacement. Neuro: Negative; no changes in balance, headaches Skin: Negative; No rashes or skin lesions Psychiatric: Negative; No behavioral problems, depression Sleep: Negative; No snoring, daytime sleepiness, hypersomnolence, bruxism, restless legs, hypnogognic hallucinations, no cataplexy Other comprehensive 14 point system review is negative.   PE BP 138/84   Pulse (!) 54   Ht '5\' 6"'$  (1.676 m)   Wt 173 lb 6.4 oz (78.7 kg)   SpO2 95%   BMI 27.99 kg/m    Repeat blood pressure by me 136/84  Wt Readings from Last 3 Encounters:  05/11/19 173 lb 6.4 oz (78.7 kg)  02/21/19 175 lb 11.2 oz (79.7 kg)  10/25/18 172 lb (78 kg)   BP 138/84   Pulse (!) 54   Ht '5\' 6"'$  (1.676 m)   Wt 173 lb 6.4 oz (78.7 kg)   SpO2 95%   BMI 27.99 kg/m  General: Alert, oriented, no distress.  Skin: normal turgor, no rashes, warm and dry HEENT: Normocephalic, atraumatic. Pupils equal round and reactive to light; sclera anicteric; extraocular muscles intact;  Nose without nasal septal hypertrophy Mouth/Parynx benign; Mallinpatti scale 3 Neck: Transmittedmurmurs to carotids, status post right carotid endarterectomy.  No JVD, Lungs: clear to ausculatation and percussion; no wheezing or rales Chest wall: without tenderness to palpitation Heart: PMI not displaced, RRR, s1 s2 normal, 2/6 mid peaking systolic murmur, no diastolic murmur, no rubs, gallops,  thrills, or heaves Abdomen: soft, nontender; no hepatosplenomehaly, BS+; abdominal aorta nontender and not dilated by palpation. Back: no CVA tenderness Pulses 2+ Musculoskeletal: full range of motion, normal strength, no joint deformities Extremities: no clubbing cyanosis or edema, Homan's sign negative  Neurologic: grossly nonfocal; Cranial nerves grossly wnl Psychologic: Normal mood and affect  ECG (independently read by me): Sinus bradycardia at 54 bpm with mild sinus arrhythmia.  First-degree AV block with a PR interval at 214 ms.  No ectopy.  September 2020 ECG (independently read by me): Sinus bradycardia 54 bpm, isolated PAC, RV conduction delay/incomplete right bundle branch block.  PR interval 202 ms.  March 2020 ECG (independently read by me): Sinus rhythm at 60 bpm with mild sinus arrhythmia.  Normal intervals  September 2019 ECG (independently read by me): Sinus bradycardia at 51 bpm.  Mild RV conduction delay.  No significant ST changes.  PR interval 200 ms.  October 2018 ECG (independently read by me): Normal sinus rhythm with PACs.  PR interval 200 ms, QTc interval 412 ms.  April 2018 ECG (independently read by me): Normal sinus rhythm with sinus arrhythmia with an average heart rate at 60 bpm.  Normal intervals.  Small inferior Q waves.  February 2017 ECG (independently read by me): Sinus bradycardia with mild sinus arrhythmia at 58 bpm.  Mild RV conduction delay.  No significant ST segment changes.  July 2016 ECG (independently read by me): Sinus bradycardia with mild sinus arrhythmia, heart rate ranging from 48-58.  Prior December 2014 ECG: Normal sinus rhythm at 54 beats per minute; normal intervals.  LABS: I personally reviewed the blood work  from [facility] done on 07/19/2015.   Total cholesterol 142, triglycerides 120, HDL 61, LDL 57.  I extensively reviewed laboratory from [facility] primary care from 08/29/2016  I personally reviewed the laboratory as noted above from  February 2019 Total cholesterol 125, triglycerides 73, HDL 49, LDL 61.  Personally reviewed lab work from March 22, 2019.  BMP Latest Ref Rng & Units 03/27/2016 03/26/2016 03/18/2016  Glucose 65 - 99 mg/dL 99 - 96  BUN 6 - 20 mg/dL 16 - 17  Creatinine 0.61 - 1.24 mg/dL 1.08 0.96 1.06  Sodium 135 - 145 mmol/L 137 - 135  Potassium 3.5 - 5.1 mmol/L 4.0 - 4.5  Chloride 101 - 111 mmol/L 108 - 103  CO2 22 - 32 mmol/L 24 - 24  Calcium 8.9 - 10.3 mg/dL 8.3(L) - 9.9   Hepatic Function Latest Ref Rng & Units 03/18/2016 01/15/2016 01/13/2016  Total Protein 6.5 - 8.1 g/dL 7.7 5.6(L) 5.8(L)  Albumin 3.5 - 5.0 g/dL 4.2 1.7(L) 1.5(L)  AST 15 - 41 U/L 22 53(H) 39  ALT 17 - 63 U/L 16(L) 56 60  Alk Phosphatase 38 - 126 U/L 70 116 128(H)  Total Bilirubin 0.3 - 1.2 mg/dL 0.6 0.7 0.5  Bilirubin, Direct 0.1 - 0.5 mg/dL - - -   CBC Latest Ref Rng & Units 03/27/2016 03/26/2016 03/18/2016  WBC 4.0 - 10.5 K/uL 7.3 8.0 6.8  Hemoglobin 13.0 - 17.0 g/dL 10.5(L) 11.4(L) 12.5(L)  Hematocrit 39.0 - 52.0 % 31.5(L) 33.4(L) 38.2(L)  Platelets 150 - 400 K/uL 175 184 226   Lab Results  Component Value Date   MCV 94.6 03/27/2016   MCV 93.8 03/26/2016   MCV 94.8 03/18/2016   Lab Results  Component Value Date   TSH 4.322 01/10/2016   Lab Results  Component Value Date   HGBA1C 7.2 (H) 01/10/2016     Lipid Panel     Component Value Date/Time   CHOL 145 01/18/2014 0803   CHOL 139 08/30/2012 0845   TRIG 70 01/18/2014 0803   TRIG 82 08/30/2012 0845   HDL 64 01/18/2014 0803   HDL 68 08/30/2012 0845   CHOLHDL 2.3 01/18/2014 0803   VLDL 14 01/18/2014 0803   LDLCALC 67 01/18/2014 0803   LDLCALC 55 08/30/2012 0845   IMPRESSION:  1. Nonrheumatic aortic valve stenosis   2. Coronary artery disease involving native coronary artery of native heart without angina pectoris   3. Hx of CABG: November 2009   4. Essential hypertension   5. Carotid artery stenosis status post right carotid endarterectomy 03/26/2016    6. Grade II diastolic dysfunction   7. Hyperlipidemia with target LDL less than 70     ASSESSMENT AND PLAN: Mr.Chastain is an 86 year old gentleman who underwent emergent CABG revascularization surgery for life-threatening coronary anatomy in November 2009.  He has a history of peripheral vascular disease and underwent successful right carotid endarterectomy by Dr. [name].  He last saw Dr. [name] in January 2021 and carotid studies were stable with velocities in the 1 to 39% range bilaterally.  He had normal vertebral artery antegrade flow and normal flow hemodynamics in bilateral subclavian arteries.  He is on yearly follow-up investigation.  Mr. Benningfield continues to be without anginal symptomatology.  He denies any change in exercise tolerance.  He continues to be on amlodipine 5 mg, losartan 100 mg daily and Toprol-XL 75 mg daily for hypertension.  He is not having any anginal symptomatology now 11-1/2 years status post emergent CABG revascularization.  I reviewed his most recent echo Doppler study which confirms normal systolic function, grade 2 diastolic dysfunction, as well as moderate aortic stenosis.  His gradients are not significantly changed from previously with his mean gradient at 24 and peak gradient at 39 mmHg.  He continues to be on levothyroxine for hypothyroidism currently at 112 mcg daily.  He has been maintained on dual antiplatelet therapy with aspirin and Plavix and denies bleeding.  We are aggressively treating his lipids and he currently is on rosuvastatin 40 mg in addition to 1400 mg capsule of omega-3 fatty acid.  Most recent lipid studies are excellent with LDL cholesterol at 62 and triglycerides at 94.  We will continue his current medical regimen.  I will see him in 6 months for reevaluation or sooner if problems arise.   Troy Sine, MD, [facility]  05/13/2019 6:26 PM

## 2019-05-11 NOTE — Patient Instructions (Signed)

## 2019-05-13 ENCOUNTER — Other Ambulatory Visit: Payer: Self-pay

## 2019-05-13 ENCOUNTER — Encounter: Payer: Self-pay | Admitting: Cardiovascular Disease

## 2019-05-13 MED ORDER — AMLODIPINE BESYLATE 5 MG PO TABS: ORAL_TABLET | ORAL | 1 refills | Status: DC

## 2019-06-06 DIAGNOSIS — R1012 Left upper quadrant pain: Secondary | ICD-10-CM | POA: Diagnosis not present

## 2019-06-06 DIAGNOSIS — K8020 Calculus of gallbladder without cholecystitis without obstruction: Secondary | ICD-10-CM | POA: Diagnosis not present

## 2019-06-23 DIAGNOSIS — L570 Actinic keratosis: Secondary | ICD-10-CM | POA: Diagnosis not present

## 2019-06-23 DIAGNOSIS — Z85828 Personal history of other malignant neoplasm of skin: Secondary | ICD-10-CM | POA: Diagnosis not present

## 2019-07-04 DIAGNOSIS — H02122 Mechanical ectropion of right lower eyelid: Secondary | ICD-10-CM | POA: Diagnosis not present

## 2019-07-04 DIAGNOSIS — H25813 Combined forms of age-related cataract, bilateral: Secondary | ICD-10-CM | POA: Diagnosis not present

## 2019-07-04 DIAGNOSIS — H02125 Mechanical ectropion of left lower eyelid: Secondary | ICD-10-CM | POA: Diagnosis not present

## 2019-09-08 DIAGNOSIS — Z85828 Personal history of other malignant neoplasm of skin: Secondary | ICD-10-CM | POA: Diagnosis not present

## 2019-09-08 DIAGNOSIS — D485 Neoplasm of uncertain behavior of skin: Secondary | ICD-10-CM | POA: Diagnosis not present

## 2019-09-08 DIAGNOSIS — C4441 Basal cell carcinoma of skin of scalp and neck: Secondary | ICD-10-CM | POA: Diagnosis not present

## 2019-09-08 DIAGNOSIS — L570 Actinic keratosis: Secondary | ICD-10-CM | POA: Diagnosis not present

## 2019-10-10 ENCOUNTER — Other Ambulatory Visit: Payer: Self-pay

## 2019-10-10 MED ORDER — AMLODIPINE BESYLATE 5 MG PO TABS: ORAL_TABLET | ORAL | 2 refills | Status: DC

## 2019-10-10 MED ORDER — FOLIC ACID 1 MG PO TABS: 1.0000 mg | ORAL_TABLET | Freq: Every day | ORAL | 2 refills | Status: DC

## 2019-10-10 MED ORDER — CLOPIDOGREL BISULFATE 75 MG PO TABS: 75.0000 mg | ORAL_TABLET | Freq: Every day | ORAL | 2 refills | Status: DC

## 2019-11-10 ENCOUNTER — Other Ambulatory Visit: Payer: Self-pay | Admitting: Cardiovascular Disease

## 2019-12-07 ENCOUNTER — Other Ambulatory Visit: Payer: Self-pay | Admitting: Cardiovascular Disease

## 2019-12-26 DIAGNOSIS — H02122 Mechanical ectropion of right lower eyelid: Secondary | ICD-10-CM | POA: Diagnosis not present

## 2019-12-26 DIAGNOSIS — Z01818 Encounter for other preprocedural examination: Secondary | ICD-10-CM | POA: Diagnosis not present

## 2019-12-26 DIAGNOSIS — H02125 Mechanical ectropion of left lower eyelid: Secondary | ICD-10-CM | POA: Diagnosis not present

## 2019-12-27 ENCOUNTER — Telehealth: Payer: Self-pay

## 2019-12-27 ENCOUNTER — Ambulatory Visit: Payer: PPO | Admitting: Cardiovascular Disease

## 2019-12-27 ENCOUNTER — Encounter: Payer: Self-pay | Admitting: Cardiovascular Disease

## 2019-12-27 ENCOUNTER — Other Ambulatory Visit: Payer: Self-pay

## 2019-12-27 DIAGNOSIS — E785 Hyperlipidemia, unspecified: Secondary | ICD-10-CM | POA: Diagnosis not present

## 2019-12-27 DIAGNOSIS — I6523 Occlusion and stenosis of bilateral carotid arteries: Secondary | ICD-10-CM

## 2019-12-27 DIAGNOSIS — I2510 Atherosclerotic heart disease of native coronary artery without angina pectoris: Secondary | ICD-10-CM | POA: Diagnosis not present

## 2019-12-27 DIAGNOSIS — Z951 Presence of aortocoronary bypass graft: Secondary | ICD-10-CM

## 2019-12-27 DIAGNOSIS — I350 Nonrheumatic aortic (valve) stenosis: Secondary | ICD-10-CM

## 2019-12-27 DIAGNOSIS — E039 Hypothyroidism, unspecified: Secondary | ICD-10-CM | POA: Diagnosis not present

## 2019-12-27 NOTE — Telephone Encounter (Signed)
   [facility] Pre-operative Risk Assessment    Dr. Shelva Majestic  Request for surgical clearance:  1. What type of surgery is being performed? Lateral tarsal strip - bilateral   2. When is this surgery scheduled? 02/02/20   3. What type of clearance is required (medical clearance vs. Pharmacy clearance to hold med vs. Both)? BOTH  4. Are there any medications that need to be held prior to surgery and how long? Aspirin - 5 days before surgery; Plavix - 5 days before surgery   5. Practice name and name of physician performing surgery? [facility]   6. What is the office phone number? [phone] Ext. [phone]   7.   What is the office fax number? [phone], Cheree Ditto, NP  8.   Anesthesia type (None, local, MAC, general) ? IV sedation   Alvy Beal Apple 12/27/2019, 4:34 PM  _________________________________________________________________   (provider comments below)

## 2019-12-27 NOTE — Progress Notes (Signed)
Patient ID: Dillon [name], male   DOB: 1933-03-08, 86 y.o.   MRN: [id number]     HPI: Dillon [name], is a 86 y.o. male who presents to the office today for a 7 month follow-up cardiology evaluation.  In November 2009 Dillon [name] underwent emergent CABG revascularization surgery after cardiac catheterization revealed severe life-threatening anatomy with 95% ostial left main stenosis a 99% ostial RCA stenosis. Surgery was done by Dr. Cyndia Bent and he had a LIMA to the LAD, vein to the obtuse marginal, vein to the RCA. His last nuclear perfusion study in April 2012 continued to show normal perfusion.  Dillon [name] has documented carotid disease. A carotid Doppler study  in November 2013 which showed at least 60% stenosis in his carotid arteries bilaterally which was slightly increased from previously. A f/u carotid evaluation last year demonstrated his peak right internal carotid systolic velocity 694 slightly increased from 240 in his left PICA systolic velocity up to 11 slightly increased from 200; 50-69% diameter reduction range bilaterally and was not significantly changed from one year ago.  On 12/01/2013 a follow-up study demonstrated a peak PICA velocity was now 854 with diastolic velocity at 42 and the right carotid and 218 and 61 in the left carotid.  He remains asymptomatic and these place him in the upper end of scale in the 50-69% range  Additional problems include mixed hyperlipidemia and hypertension.  He has been on Toprol-XL 50 mg and losartan 100 mg in addition to amlodipine 5 mg for blood pressure control.. In the past he had derived marked benefit with Niaspan  as well as [name]. Follow-up laboratory on his current dose of [name] and niacin 1000 mg  revealed a total cholesterol 145, triglycerides 70, HDL 64, and LDL 67.  His glucose was 111.  TSH 4.5.  He had normal renal function with a BUN of 19 and creatinine of 1.1.  He underwent echo Doppler study on 06/06/2014.  This showed an  ejection fraction at 55-60%.  There was a small systolic gradient across his aortic valve with moderately calcified leaflets.  Valve area was 1.6 cm.  He had a mean gradient of 11 and a peak gradient of 23 mm suggestive of mild aortic stenosis.  PA pressure was 31 mm.  A nuclear perfusion study which remained normal with an ejection fraction of 59% and evidence for normal perfusion.  He underwent a F/U 2-D echo Doppler study on 08/20/2015 which showed an EF of 60-65%.  The aortic valve was calcified and thickened with mildly restricted motion.  Peak and mean gradients were 25 and 14 mm consistent with mild aortic stenosis with a valve area of 1.42 cm.  I scheduled him for follow-up carotid duplex exam which was done in July 2017.  This suggested progression of his right internal carotid stenoses with velocity now at 465/131, which places him in the greater than 80% range.  He had stable left internal carotid velocities now or in the 60-79% range.  He had normal subclavian arteries bilaterally, and patent vertebral arteries with antegrade flow.  I referred him to Dr. [name] who felt that with his asymptomatic status and low risk assessment that he should undergo carotid endarterectomy.  He was referred to Dr. [name] for an office evaluation but this has not yet been scheduled.  He was hospitalized in December 2017 with community-acquired pneumonia of the left lower lung and possible empyema.  He underwent thoracentesis as well as a VATS procedure  with decortication and left empyema drainage.  His carotid surgery with Dr. [name] was ultimately postponed but was successfully done on 03/26/2016 with right carotid endarterectomy.  Intraoperative findings included a 95% stenosis.  He was discharged the following day.    I  saw him in October 2018 at which time he was doing well without chest pain, PND, orthopnea. He denies paresthesias, presyncope or syncope.  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  He had his fifth hole in one of his career on a par 3.  He recently had blood work done in [place] by his primary physician.  Renal function was stable, although potassium was upper normal at 5.2.  Lipid studies revealed cholesterol 148, triglycerides 134, HDL 52, LDL 69.  Thyroid function studies were normal.  Hemoglobin A1c was elevated at 6.4 and his estimated average glucose was 137.  He underwent a follow-up echo Doppler study in 11/04/2016.  This continued to show hyperdynamic LV function with an EF of 65-70% with moderate LVH.  Wall motion was normal.  His aortic valve gradient have slightly increased over the year, such that his peak gradient increased from 25 to 38 mm, and his mean gradient from 14 mm to 20 mmHg. Aortic valve area was 1.57 cm placing him still in the mild to mild to moderate AS category.    He had follow-up carotid imaging with Dr. [name] which showed a patent carotid endarterectomy site with no evidence for restenosis or hyperplasia.  Left internal carotid artery velocities suggested a 40 to 59% stenosis.  When I last saw him in September 2019 he denied any chest pain, presyncope or syncope or any symptoms of heart failure.  I reviewed recent laboratory done by his primary physician on March 04, 2017.  Chemistry was stable.  Total cholesterol was 125, triglycerides 73, HDL 49, and LDL 61.  TSH is 1.77.  Free T3 was 3.07 and free T4 1.0.  He was not anemic.  HbA1c 6.2  Dillon [name] underwent an evaluation by Jory Sims, NP in December 2019 after developing some mild irregularity to his heart rate was noted by his primary physician.  He was felt to have occasional to frequent PACs and had bigeminal PACs.  This ultimately resolved on its own.  On April 01, 2018 he underwent a follow-up echo Doppler study which continued to show normal LV function with an EF of 55 to 60%.  There was mitral annular calcification.  His aortic stenosis was in the moderate range with a  mean gradient now at 25 and a peak instantantaneous gradient at 41 with a valve area of 1.3 cm.  When that evaluation he remained asymptomatic and specifically denied chest pain, PND, orthopnea, presyncope or syncope.   I last saw him in September 2020 at which time he remained asymptomatic.  His wife had recently under gone knee surgery in August and they have been staying local instead of going down to [place].  He continues to deny any episodes of chest pain, change in exercise tolerance, palpitations, presyncope or syncope, or CHF symptomatology.  He is unaware of any palpitations.    Dillon [name] underwent a 1 year follow-up echo Doppler study on April 18, 2019.  LV function remains normal with EF 60 to 65%.  There was grade 2 diastolic dysfunction and mild LVH.  He had moderately elevated pulmonary artery systolic pressure at 44 mm.  There was moderate aortic stenosis with a mean gradient of 24  and peak instantaneous gradient at 39 mmHg.  Aortic valve area was 1.04 cm.  There was mild aortic insufficiency.  When I saw him in April 2021 he remained asymptomatic and denied any chest pain or shortness of breath.  He denied any exertional dyspnea.   He had laboratory checked in February 2021 which showed a total cholesterol 138 HDL 57 LDL 62 and triglycerides 94.  During that evaluation I reviewed his echo Doppler with him in detail and his gradients had not significantly changed from prior evaluation.  Since I last saw him he has continued to do well.  He was at [facility] in [place] and is beach, [place] house.  He played golf 6 times without chest pain or shortness of breath.  He states his heart rate typically runs in the 50s.  He denies any chest pain, dizziness or shortness of breath.  He presents for reevaluation.  Past Medical History:  Diagnosis Date   CAD (coronary artery disease)    Heart murmur    History of hiatal hernia    Hypertension    Hypothyroidism     Pneumonia 12/2015   hx   S/P CABG x 3 12/17/07   LIMA to LAD,SVG to left C    Past Surgical History:  Procedure Laterality Date   CORONARY ARTERY BYPASS GRAFT  12/17/07   LIMA to LAD,vein to obtuse marginal,vein to RCA   EMPYEMA DRAINAGE Left 01/11/2016   Procedure: EMPYEMA DRAINAGE;  Surgeon: Kerin Perna, MD;  Location: [facility] OR;  Service: Thoracic;  Laterality: Left;   ENDARTERECTOMY Right 03/26/2016   Procedure: RIGHT CAROTID ENDARTERECTOMY;  Surgeon: Nada Libman, MD;  Location: [facility] OR;  Service: Vascular;  Laterality: Right;   PATCH ANGIOPLASTY Right 03/26/2016   Procedure: PATCH ANGIOPLASTY USING Livia Snellen BIOLOGIC PATCH;  Surgeon: Nada Libman, MD;  Location: [facility] OR;  Service: Vascular;  Laterality: Right;   VIDEO ASSISTED THORACOSCOPY (VATS)/DECORTICATION Left 01/11/2016   Procedure: VIDEO ASSISTED THORACOSCOPY (VATS)/DECORTICATION;  Surgeon: Kerin Perna, MD;  Location: [facility] OR;  Service: Thoracic;  Laterality: Left;    No Known Allergies  Current Outpatient Medications  Medication Sig Dispense Refill   amLODipine (NORVASC) 5 MG tablet TAKE 1 TABLET DAILY. 90 tablet 2   aspirin EC 81 MG tablet Take 81 mg by mouth at bedtime.      Cholecalciferol (VITAMIN D) 2000 units tablet Take 2,000 Units by mouth at bedtime.     clopidogrel (PLAVIX) 75 MG tablet Take 1 tablet (75 mg total) by mouth daily. 90 tablet 2   fexofenadine (ALLEGRA) 180 MG tablet Take 180 mg by mouth daily.     folic acid (FOLVITE) 1 MG tablet Take 1 tablet (1 mg total) by mouth daily. 90 tablet 2   levothyroxine (SYNTHROID, LEVOTHROID) 112 MCG tablet Take 112 mcg by mouth daily.     losartan (COZAAR) 100 MG tablet Take 1 tablet by mouth daily 90 tablet 1   metoprolol succinate (TOPROL-XL) 50 MG 24 hr tablet Take 1 tablet by mouth once daily with or immediately following a meal (please schedule an appointment for future refills) 90 tablet 3   mupirocin cream (BACTROBAN) 2 % Apply 1 application  topically daily as needed (after skin treatments).      Omega-3 1400 MG CAPS Take by mouth daily.     rosuvastatin (CRESTOR) 40 MG tablet Take 1 tablet by mouth every day 90 tablet 3   terbinafine (LAMISIL) 1 % cream Apply 1 application  topically daily as needed (rash).      No current facility-administered medications for this visit.    Socially he is married. There are no children. He does remain active. He does walk. There is no tobacco use. He does drink occasional alcohol.  ROS General: Negative; No fevers, chills, or night sweats;  HEENT: Negative; No changes in vision or hearing, sinus congestion, difficulty swallowing Pulmonary: Negative; No cough, wheezing, shortness of breath, hemoptysis Cardiovascular:  See HPI GI: Negative; No nausea, vomiting, diarrhea, or abdominal pain GU: Negative; No dysuria, hematuria, or difficulty voiding Musculoskeletal: Negative; no myalgias, joint pain, or weakness Hematologic/Oncology: Negative; no easy bruising, bleeding Endocrine: Positive for hypothyroidism on Synthroid replacement. Neuro: Negative; no changes in balance, headaches Skin: Negative; No rashes or skin lesions Psychiatric: Negative; No behavioral problems, depression Sleep: Negative; No snoring, daytime sleepiness, hypersomnolence, bruxism, restless legs, hypnogognic hallucinations, no cataplexy Other comprehensive 14 point system review is negative.   PE BP (!) 152/76    Pulse (!) 48    Ht 5\' 6"  (1.676 m)    Wt 170 lb 9.6 oz (77.4 kg)    SpO2 99%    BMI 27.54 kg/m    Repeat blood pressure by me was 140/72  Wt Readings from Last 3 Encounters:  12/27/19 170 lb 9.6 oz (77.4 kg)  05/11/19 173 lb 6.4 oz (78.7 kg)  02/21/19 175 lb 11.2 oz (79.7 kg)    General: Alert, oriented, no distress.  Skin: normal turgor, no rashes, warm and dry HEENT: Normocephalic, atraumatic. Pupils equal round and reactive to light; sclera anicteric; extraocular muscles intact; Nose without  nasal septal hypertrophy Mouth/Parynx benign; Mallinpatti scale 3 Neck: Status post right carotid endarterectomy.  No JVD, no carotid bruits; normal carotid upstroke Lungs: clear to ausculatation and percussion; no wheezing or rales Chest wall: without tenderness to palpitation Heart: PMI not displaced, RRR, s1 s2 normal, 2/6 mid peaking systolic murmur in the aortic area, no diastolic murmur, no rubs, gallops, thrills, or heaves Abdomen: soft, nontender; no hepatosplenomehaly, BS+; abdominal aorta nontender and not dilated by palpation. Back: no CVA tenderness Pulses 2+ Musculoskeletal: full range of motion, normal strength, no joint deformities Extremities: no clubbing cyanosis or edema, Homan's sign negative  Neurologic: grossly nonfocal; Cranial nerves grossly wnl Psychologic: Normal mood and affect   ECG (independently read by me): Sinus bradycardia at 48 bpm, first-degree AV block, PR 216 ms.  No ectopy.  No ECG changes.  QTc interval normal at 398 ms  April14, 2021 ECG (independently read by me): Sinus bradycardia at 54 bpm with mild sinus arrhythmia.  First-degree AV block with a PR interval at 214 ms.  No ectopy.  September 2020 ECG (independently read by me): Sinus bradycardia 54 bpm, isolated PAC, RV conduction delay/incomplete right bundle branch block.  PR interval 202 ms.  March 2020 ECG (independently read by me): Sinus rhythm at 60 bpm with mild sinus arrhythmia.  Normal intervals  September 2019 ECG (independently read by me): Sinus bradycardia at 51 bpm.  Mild RV conduction delay.  No significant ST changes.  PR interval 200 ms.  October 2018 ECG (independently read by me): Normal sinus rhythm with PACs.  PR interval 200 ms, QTc interval 412 ms.  April 2018 ECG (independently read by me): Normal sinus rhythm with sinus arrhythmia with an average heart rate at 60 bpm.  Normal intervals.  Small inferior Q waves.  February 2017 ECG (independently read by me): Sinus  bradycardia with mild sinus arrhythmia at  58 bpm.  Mild RV conduction delay.  No significant ST segment changes.  July 2016 ECG (independently read by me): Sinus bradycardia with mild sinus arrhythmia, heart rate ranging from 48-58.  Prior December 2014 ECG: Normal sinus rhythm at 54 beats per minute; normal intervals.  LABS: I personally reviewed the blood work  from [facility] done on 07/19/2015.   Total cholesterol 142, triglycerides 120, HDL 61, LDL 57.  I extensively reviewed laboratory from [facility] primary care from 08/29/2016  I personally reviewed the laboratory as noted above from February 2019 Total cholesterol 125, triglycerides 73, HDL 49, LDL 61.  Personally reviewed lab work from March 22, 2019.  BMP Latest Ref Rng & Units 03/27/2016 03/26/2016 03/18/2016  Glucose 65 - 99 mg/dL 99 - 96  BUN 6 - 20 mg/dL 16 - 17  Creatinine 0.61 - 1.24 mg/dL 1.08 0.96 1.06  Sodium 135 - 145 mmol/L 137 - 135  Potassium 3.5 - 5.1 mmol/L 4.0 - 4.5  Chloride 101 - 111 mmol/L 108 - 103  CO2 22 - 32 mmol/L 24 - 24  Calcium 8.9 - 10.3 mg/dL 8.3(L) - 9.9   Hepatic Function Latest Ref Rng & Units 03/18/2016 01/15/2016 01/13/2016  Total Protein 6.5 - 8.1 g/dL 7.7 5.6(L) 5.8(L)  Albumin 3.5 - 5.0 g/dL 4.2 1.7(L) 1.5(L)  AST 15 - 41 U/L 22 53(H) 39  ALT 17 - 63 U/L 16(L) 56 60  Alk Phosphatase 38 - 126 U/L 70 116 128(H)  Total Bilirubin 0.3 - 1.2 mg/dL 0.6 0.7 0.5  Bilirubin, Direct 0.1 - 0.5 mg/dL - - -   CBC Latest Ref Rng & Units 03/27/2016 03/26/2016 03/18/2016  WBC 4.0 - 10.5 K/uL 7.3 8.0 6.8  Hemoglobin 13.0 - 17.0 g/dL 10.5(L) 11.4(L) 12.5(L)  Hematocrit 39 - 52 % 31.5(L) 33.4(L) 38.2(L)  Platelets 150 - 400 K/uL 175 184 226   Lab Results  Component Value Date   MCV 94.6 03/27/2016   MCV 93.8 03/26/2016   MCV 94.8 03/18/2016   Lab Results  Component Value Date   TSH 4.322 01/10/2016   Lab Results  Component Value Date   HGBA1C 7.2 (H) 01/10/2016     Lipid Panel     Component Value  Date/Time   CHOL 145 01/18/2014 0803   CHOL 139 08/30/2012 0845   TRIG 70 01/18/2014 0803   TRIG 82 08/30/2012 0845   HDL 64 01/18/2014 0803   HDL 68 08/30/2012 0845   CHOLHDL 2.3 01/18/2014 0803   VLDL 14 01/18/2014 0803   LDLCALC 67 01/18/2014 0803   LDLCALC 55 08/30/2012 0845   IMPRESSION:  1. Nonrheumatic aortic valve stenosis   2. Coronary artery disease involving native coronary artery of native heart without angina pectoris   3. Hx of CABG: November 2009   4. Carotid artery stenosis status post right carotid endarterectomy 03/26/2016   5. Hyperlipidemia with target LDL less than 70   6. Hypothyroidism, unspecified type     ASSESSMENT AND PLAN: Dillon [name] is an 86 year old gentleman who underwent emergent CABG revascularization surgery for life-threatening coronary anatomy in November 2009.  He has a history of peripheral vascular disease and underwent successful right carotid endarterectomy by Dr. [name].  He last saw Dr. [name] in January 2021 and carotid studies were stable with velocities in the 1 to 39% range bilaterally.  He had normal vertebral artery antegrade flow and normal flow hemodynamics in bilateral subclavian arteries.  He is on yearly follow-up investigation.  Mr. Pare continues to  feel well and is without recurrent anginal symptomatology.  He denies any change in exercise tolerance.  He plays golf fairly regularly and has not noticed significant change in his ability to walk.  His blood pressure today was mildly elevated.  I have recommended close monitoring.  He is on amlodipine 5 mg, losartan 100 mg in addition to metoprolol succinate 50 mg.  He is bradycardic but without symptoms.  He states his pulse at home typically runs in the 50s.  He is not having any palpitations.  He is tolerating rosuvastatin 40 mg daily.  His primary provider Dillon Penny Drosinis PA-C will be checking laboratory.  Target LDL is less than 70.  If his heart rate becomes more severely bradycardic  with pulse in the low 40s he may require dose reduction of metoprolol.  I have recommended he undergo a follow-up echo Doppler study in April 2022 to reassess his aortic stenosis.  He continues to be on levothyroxine for hypothyroidism.  I have maintained him on DAPT with aspirin/Plavix long-term.  He is tolerating this well without bleeding.  I will see him in the office for follow-up evaluation after his echo evaluation or sooner as needed.   Troy Sine, MD, [facility]  12/28/2019 5:51 PM

## 2019-12-27 NOTE — Patient Instructions (Signed)
Medication Instructions:  NO MEDICATION CHANGES TODAY  *If you need a refill on your cardiac medications before your next appointment, please call your pharmacy*  Testing/Procedures: Your physician has requested that you have an echocardiogram. THIS NEEDS TO BE SCHEDULED IN April 2022. Echocardiography is a painless test that uses sound waves to create images of your heart. It provides your doctor with information about the size and shape of your heart and how well your heart's chambers and valves are working. You may receive an ultrasound enhancing agent through an IV if needed to better visualize your heart during the echo.This procedure takes approximately one hour. There are no restrictions for this procedure. This will take place at the [address].   Follow-Up: At [facility], you and your health needs are our priority.  As part of our continuing mission to provide you with exceptional heart care, we have created designated Provider Care Teams.  These Care Teams include your primary Cardiologist (physician) and Advanced Practice Providers (APPs -  Physician Assistants and Nurse Practitioners) who all work together to provide you with the care you need, when you need it.  Your next appointment:   April/MAY after Echo is completed The format for your next appointment:   In Person  Provider:   Shelva Majestic, MD  Other Instructions PLEASE LET us KNOW IF YOUR HEART RATE SLOWS TO LOWER 40's WE MAY NEED TO DECREASE METOPROLOL DOSAGE. PLEASE CALL OUR OFFICE WITH ISSUES- [phone]

## 2019-12-28 ENCOUNTER — Encounter: Payer: Self-pay | Admitting: Cardiovascular Disease

## 2019-12-28 NOTE — Telephone Encounter (Signed)
Dr. Claiborne Billings -  You saw this patient in the office yesterday. Can you please comment on clearance for tarsal tear surgery and if he can hold ASA and plavix?

## 2020-01-01 NOTE — Telephone Encounter (Signed)
Okay to hold aspirin and Plavix for 5 days prior to procedure

## 2020-01-02 NOTE — Telephone Encounter (Signed)
   Primary Cardiologist: Shelva Majestic, MD  Chart reviewed as part of pre-operative protocol coverage. Patient was recently seen by Dr. Claiborne Billings on 12/27/2019 and he was doing well at that time. He denied any chest pain, shortness of breath, or dizziness. Given past medical history and time since last visit, based on ACC/AHA guidelines, Ellison Rieth would be at acceptable risk for the planned procedure without further cardiovascular testing.   Per Dr. Claiborne Billings, Coffee to hold Plavix and Aspirin for 5 days prior to procedure. Both should be restarted as soon as able following procedure.   I will route this recommendation to the requesting party via Epic fax function and remove from pre-op pool.  Please call with questions.  Darreld Mclean, PA-C 01/02/2020, 7:31 AM

## 2020-01-31 DIAGNOSIS — B351 Tinea unguium: Secondary | ICD-10-CM | POA: Diagnosis not present

## 2020-01-31 DIAGNOSIS — D485 Neoplasm of uncertain behavior of skin: Secondary | ICD-10-CM | POA: Diagnosis not present

## 2020-01-31 DIAGNOSIS — D225 Melanocytic nevi of trunk: Secondary | ICD-10-CM | POA: Diagnosis not present

## 2020-01-31 DIAGNOSIS — L814 Other melanin hyperpigmentation: Secondary | ICD-10-CM | POA: Diagnosis not present

## 2020-01-31 DIAGNOSIS — C44329 Squamous cell carcinoma of skin of other parts of face: Secondary | ICD-10-CM | POA: Diagnosis not present

## 2020-01-31 DIAGNOSIS — L821 Other seborrheic keratosis: Secondary | ICD-10-CM | POA: Diagnosis not present

## 2020-01-31 DIAGNOSIS — Z85828 Personal history of other malignant neoplasm of skin: Secondary | ICD-10-CM | POA: Diagnosis not present

## 2020-01-31 DIAGNOSIS — L570 Actinic keratosis: Secondary | ICD-10-CM | POA: Diagnosis not present

## 2020-02-02 DIAGNOSIS — H02125 Mechanical ectropion of left lower eyelid: Secondary | ICD-10-CM | POA: Diagnosis not present

## 2020-02-02 DIAGNOSIS — H02122 Mechanical ectropion of right lower eyelid: Secondary | ICD-10-CM | POA: Diagnosis not present

## 2020-02-20 DIAGNOSIS — Z85828 Personal history of other malignant neoplasm of skin: Secondary | ICD-10-CM | POA: Diagnosis not present

## 2020-02-20 DIAGNOSIS — C44329 Squamous cell carcinoma of skin of other parts of face: Secondary | ICD-10-CM | POA: Diagnosis not present

## 2020-03-05 ENCOUNTER — Ambulatory Visit (HOSPITAL_COMMUNITY)
Admission: RE | Admit: 2020-03-05 | Discharge: 2020-03-05 | Disposition: A | Discharge status: Home / Self Care | Payer: PPO | Source: Ambulatory Visit | Attending: Physician Assistant | Admitting: Physician Assistant

## 2020-03-05 ENCOUNTER — Ambulatory Visit: Payer: PPO | Admitting: Physician Assistant

## 2020-03-05 ENCOUNTER — Other Ambulatory Visit: Payer: Self-pay

## 2020-03-05 VITALS — BP 141/57 | HR 50 | Temp 98.7°F | Resp 20 | Ht 66.0 in | Wt 174.7 lb

## 2020-03-05 DIAGNOSIS — R1012 Left upper quadrant pain: Secondary | ICD-10-CM | POA: Insufficient documentation

## 2020-03-05 DIAGNOSIS — I6523 Occlusion and stenosis of bilateral carotid arteries: Secondary | ICD-10-CM

## 2020-03-05 DIAGNOSIS — K8020 Calculus of gallbladder without cholecystitis without obstruction: Secondary | ICD-10-CM | POA: Insufficient documentation

## 2020-03-05 NOTE — Progress Notes (Signed)
History of Present Illness:  Patient is a 87 y.o. year old male who presents for evaluation of carotid stenosis.    S/P right CEA by Dr. [name] 03/26/2016.  The patient denies symptoms of TIA, amaurosis, or stroke.  The patient is currently on ASA and Plvix antiplatelet therapy.  He is currently stable, stays busy and plays gold.    Past Medical History:  Diagnosis Date  . CAD (coronary artery disease)   . Heart murmur   . History of hiatal hernia   . Hypertension   . Hypothyroidism   . Pneumonia 12/2015   hx  . S/P CABG x 3 12/17/07   LIMA to LAD,SVG to left C    Past Surgical History:  Procedure Laterality Date  . CORONARY ARTERY BYPASS GRAFT  12/17/07   LIMA to LAD,vein to obtuse marginal,vein to RCA  . EMPYEMA DRAINAGE Left 01/11/2016   Procedure: EMPYEMA DRAINAGE;  Surgeon: Ivin Poot, MD;  Location: [facility];  Service: Thoracic;  Laterality: Left;  . ENDARTERECTOMY Right 03/26/2016   Procedure: RIGHT CAROTID ENDARTERECTOMY;  Surgeon: Serafina Mitchell, MD;  Location: [facility];  Service: Vascular;  Laterality: Right;  . PATCH ANGIOPLASTY Right 03/26/2016   Procedure: PATCH ANGIOPLASTY USING Rueben Bash BIOLOGIC PATCH;  Surgeon: Serafina Mitchell, MD;  Location: [facility] [facility];  Service: Vascular;  Laterality: Right;  Marland Kitchen VIDEO ASSISTED THORACOSCOPY (VATS)/DECORTICATION Left 01/11/2016   Procedure: VIDEO ASSISTED THORACOSCOPY (VATS)/DECORTICATION;  Surgeon: Ivin Poot, MD;  Location: [facility] OR;  Service: Thoracic;  Laterality: Left;     Social History Social History   Tobacco Use  . Smoking status: Former Smoker    Types: Cigarettes    Quit date: 01/28/1971    Years since quitting: 49.1  . Smokeless tobacco: Never Used  Vaping Use  . Vaping Use: Never used  Substance Use Topics  . Alcohol use: Yes    Comment: socially  . Drug use: No    Family History Family History  Problem Relation Age of Onset  . Heart attack Father     Allergies  No Known Allergies   Current  Outpatient Medications  Medication Sig Dispense Refill  . amLODipine (NORVASC) 5 MG tablet TAKE 1 TABLET DAILY. 90 tablet 2  . aspirin EC 81 MG tablet Take 81 mg by mouth at bedtime.     . Cholecalciferol (VITAMIN D) 2000 units tablet Take 2,000 Units by mouth at bedtime.    . clopidogrel (PLAVIX) 75 MG tablet Take 1 tablet (75 mg total) by mouth daily. 90 tablet 2  . fexofenadine (ALLEGRA) 180 MG tablet Take 180 mg by mouth daily.    . folic acid (FOLVITE) 1 MG tablet Take 1 tablet (1 mg total) by mouth daily. 90 tablet 2  . levothyroxine (SYNTHROID, LEVOTHROID) 112 MCG tablet Take 112 mcg by mouth daily.    Marland Kitchen losartan (COZAAR) 100 MG tablet Take 1 tablet by mouth daily 90 tablet 1  . metoprolol succinate (TOPROL-XL) 50 MG 24 hr tablet Take 1 tablet by mouth once daily with or immediately following a meal (please schedule an appointment for future refills) 90 tablet 3  . mupirocin cream (BACTROBAN) 2 % Apply 1 application topically daily as needed (after skin treatments).     . Omega-3 1400 MG CAPS Take by mouth daily.    . rosuvastatin (CRESTOR) 40 MG tablet Take 1 tablet by mouth every day 90 tablet 3  . terbinafine (LAMISIL) 1 % cream Apply  1 application topically daily as needed (rash).      No current facility-administered medications for this visit.    ROS:   General:  No weight loss, Fever, chills  HEENT: No recent headaches, no nasal bleeding, no visual changes, no sore throat  Neurologic: No dizziness, blackouts, seizures. No recent symptoms of stroke or mini- stroke. No recent episodes of slurred speech, or temporary blindness.  Cardiac: No recent episodes of chest pain/pressure, no shortness of breath at rest.  No shortness of breath with exertion.  Denies history of atrial fibrillation or irregular heartbeat  Vascular: No history of rest pain in feet.  No history of claudication.  No history of non-healing ulcer, No history of DVT   Pulmonary: No home oxygen, no productive  cough, no hemoptysis,  No asthma or wheezing  Musculoskeletal:  [ ]  Arthritis, [ ]  Low back pain,  [ ]  Joint pain  Hematologic:No history of hypercoagulable state.  No history of easy bleeding.  No history of anemia  Gastrointestinal: No hematochezia or melena,  No gastroesophageal reflux, no trouble swallowing  Urinary: [ ]  chronic Kidney disease, [ ]  on HD - [ ]  MWF or [ ]  TTHS, [ ]  Burning with urination, [ ]  Frequent urination, [ ]  Difficulty urinating;   Skin: No rashes, multiple tissue lesions on his head from dermatolgy treatments recently  Psychological: No history of anxiety,  No history of depression   Physical Examination  Vitals:   03/05/20 1440 03/05/20 1441  BP: (!) 147/64 (!) 141/57  Pulse: (!) 50   Resp: 20   Temp: 98.7 F (37.1 C)   TempSrc: Temporal   SpO2: 99%   Weight: 174 lb 11.2 oz (79.2 kg)   Height: 5\' 6"  (1.676 m)     Body mass index is 28.2 kg/m.  General:  Alert and oriented, no acute distress HEENT: Normal Neck: No bruit or JVD Pulmonary: Clear to auscultation bilaterally Cardiac: Regular Rate and Rhythm without murmur Gastrointestinal: Soft, non-tender, non-distended, no mass, no scars Skin: No rash Extremity Pulses:  2+ radial, brachial Musculoskeletal: No deformity or edema  Neurologic: Upper and lower extremity motor 5/5 and symmetric  DATA:    Right Carotid Findings:  +----------+--------+--------+--------+----------------------+--------+       PSV cm/sEDV cm/sStenosisPlaque Description  Comments  +----------+--------+--------+--------+----------------------+--------+  CCA Prox 96   17                        +----------+--------+--------+--------+----------------------+--------+  CCA Mid  73   17                        +----------+--------+--------+--------+----------------------+--------+  CCA Distal100   17       smooth and  homogeneous      +----------+--------+--------+--------+----------------------+--------+  ICA Prox 127   26   1-39%  smooth              +----------+--------+--------+--------+----------------------+--------+  ICA Mid  106   23                        +----------+--------+--------+--------+----------------------+--------+  ICA Distal130   23                        +----------+--------+--------+--------+----------------------+--------+  ECA    213   15   >50%             tortuous  +----------+--------+--------+--------+----------------------+--------+   +----------+--------+-------+----------------+-------------------+  PSV cm/sEDV cmsDescribe    Arm Pressure (mmHG)  +----------+--------+-------+----------------+-------------------+  [id number]   0   Multiphasic, WNL            +----------+--------+-------+----------------+-------------------+   +---------+--------+--+--------+--+---------+  VertebralPSV cm/s67EDV cm/s11Antegrade  +---------+--------+--+--------+--+---------+      Left Carotid Findings:  +----------+--------+--------+--------+----------------------+--------+       PSV cm/sEDV cm/sStenosisPlaque Description  Comments  +----------+--------+--------+--------+----------------------+--------+  CCA Prox 83   15                        +----------+--------+--------+--------+----------------------+--------+  CCA Mid  82   14                        +----------+--------+--------+--------+----------------------+--------+  CCA Distal73   15                        +----------+--------+--------+--------+----------------------+--------+  ICA Prox 139   19   1-39%  irregular and  calcifictortuous  +----------+--------+--------+--------+----------------------+--------+  ICA Mid  81   11                        +----------+--------+--------+--------+----------------------+--------+  ICA Distal212   38   1-39%                   +----------+--------+--------+--------+----------------------+--------+  ECA    134   0                         +----------+--------+--------+--------+----------------------+--------+   +----------+--------+--------+----------------+-------------------+       PSV cm/sEDV cm/sDescribe    Arm Pressure (mmHG)  +----------+--------+--------+----------------+-------------------+  [id number]   0    Multiphasic, WNL            +----------+--------+--------+----------------+-------------------+   +---------+--------+--+--------+--+---------+  VertebralPSV cm/s45EDV cm/s12Antegrade  +---------+--------+--+--------+--+---------+    Summary:  Right Carotid: Velocities in the right ICA are consistent with a 1-39%  stenosis.         The ECA appears >50% stenosed.   Left Carotid: Velocities in the left ICA are consistent with a 1-39%  stenosis.   Vertebrals: Bilateral vertebral arteries demonstrate antegrade flow.  Subclavians: Normal flow hemodynamics were seen in bilateral subclavian        arteries.   ASSESSMENT:  B carotid stenosis < 39% on duplex today.  He remains asymptomatic of stroke/TIA. S/P Right CEA 2018 by Dr. [name]   PLAN: He will f/u for carotid surveillances in 1 year.  If he develops symptoms of stroke or TIA he will call 911.  Cont. ASA, Plavix and statin daily.   Roxy Horseman PA-C [facility] Office: [phone]  MD in clinic [place]

## 2020-04-09 ENCOUNTER — Other Ambulatory Visit: Payer: Self-pay

## 2020-04-09 MED ORDER — ROSUVASTATIN CALCIUM 40 MG PO TABS
ORAL_TABLET | ORAL | 3 refills | Status: DC
Start: 2020-04-09 — End: 2021-02-21

## 2020-04-26 DIAGNOSIS — I10 Essential (primary) hypertension: Secondary | ICD-10-CM | POA: Diagnosis not present

## 2020-04-26 DIAGNOSIS — E039 Hypothyroidism, unspecified: Secondary | ICD-10-CM | POA: Diagnosis not present

## 2020-04-26 DIAGNOSIS — R7303 Prediabetes: Secondary | ICD-10-CM | POA: Diagnosis not present

## 2020-04-26 DIAGNOSIS — E785 Hyperlipidemia, unspecified: Secondary | ICD-10-CM | POA: Diagnosis not present

## 2020-04-26 DIAGNOSIS — R7309 Other abnormal glucose: Secondary | ICD-10-CM | POA: Diagnosis not present

## 2020-04-26 DIAGNOSIS — Z125 Encounter for screening for malignant neoplasm of prostate: Secondary | ICD-10-CM | POA: Diagnosis not present

## 2020-04-30 DIAGNOSIS — Z79899 Other long term (current) drug therapy: Secondary | ICD-10-CM | POA: Diagnosis not present

## 2020-04-30 DIAGNOSIS — N289 Disorder of kidney and ureter, unspecified: Secondary | ICD-10-CM | POA: Diagnosis not present

## 2020-04-30 DIAGNOSIS — E785 Hyperlipidemia, unspecified: Secondary | ICD-10-CM | POA: Diagnosis not present

## 2020-04-30 DIAGNOSIS — Z7982 Long term (current) use of aspirin: Secondary | ICD-10-CM | POA: Diagnosis not present

## 2020-04-30 DIAGNOSIS — N1831 Chronic kidney disease, stage 3a: Secondary | ICD-10-CM | POA: Diagnosis not present

## 2020-04-30 DIAGNOSIS — D649 Anemia, unspecified: Secondary | ICD-10-CM | POA: Diagnosis not present

## 2020-04-30 DIAGNOSIS — E039 Hypothyroidism, unspecified: Secondary | ICD-10-CM | POA: Diagnosis not present

## 2020-04-30 DIAGNOSIS — I129 Hypertensive chronic kidney disease with stage 1 through stage 4 chronic kidney disease, or unspecified chronic kidney disease: Secondary | ICD-10-CM | POA: Diagnosis not present

## 2020-05-10 ENCOUNTER — Ambulatory Visit (HOSPITAL_COMMUNITY): Payer: PPO | Attending: Cardiology

## 2020-05-10 ENCOUNTER — Other Ambulatory Visit: Payer: Self-pay

## 2020-05-10 DIAGNOSIS — I350 Nonrheumatic aortic (valve) stenosis: Secondary | ICD-10-CM | POA: Diagnosis not present

## 2020-05-10 LAB — ECHOCARDIOGRAM COMPLETE
AR max vel: 0.96 cm2
AV Area VTI: 1.02 cm2
AV Area mean vel: 0.96 cm2
AV Mean grad: 40.4 mmHg
AV Peak grad: 70.4 mmHg
Ao pk vel: 4.2 m/s
Area-P 1/2: 2.5 cm2
P 1/2 time: 651 msec
S' Lateral: 3.05 cm

## 2020-05-14 ENCOUNTER — Telehealth: Payer: Self-pay

## 2020-05-14 MED ORDER — LOSARTAN POTASSIUM 100 MG PO TABS: 1.0000 | ORAL_TABLET | Freq: Every day | ORAL | 3 refills | Status: DC

## 2020-05-14 NOTE — Telephone Encounter (Signed)
Received a fax from [facility] order stating pt need a refill for losartan 100 mg. New Rx sent to the requesting pharmacy.

## 2020-05-17 ENCOUNTER — Telehealth: Payer: Self-pay | Admitting: Cardiovascular Disease

## 2020-05-17 DIAGNOSIS — Z85828 Personal history of other malignant neoplasm of skin: Secondary | ICD-10-CM | POA: Diagnosis not present

## 2020-05-17 DIAGNOSIS — L570 Actinic keratosis: Secondary | ICD-10-CM | POA: Diagnosis not present

## 2020-05-17 NOTE — Telephone Encounter (Signed)
Left message to call back  

## 2020-05-17 NOTE — Telephone Encounter (Signed)
Troy Sine, MD  05/15/2020 2:51 PM EDT      Echo Doppler study now shows severe aortic stenosis with a mean gradient of 47 and peak gradient of 80. EF is normal at 55 to 60%, mild concentric LVH. There is severe mitral annular calcification. Discuss at follow-up office visit.

## 2020-05-17 NOTE — Telephone Encounter (Signed)
Patient is returning call to discuss echo results. °

## 2020-05-18 NOTE — Telephone Encounter (Signed)
Pt has an appointment scheduled for 5/3 to discuss results

## 2020-05-24 ENCOUNTER — Other Ambulatory Visit: Payer: Self-pay | Admitting: Cardiovascular Disease

## 2020-05-29 ENCOUNTER — Other Ambulatory Visit: Payer: Self-pay

## 2020-05-29 ENCOUNTER — Encounter: Payer: Self-pay | Admitting: Cardiovascular Disease

## 2020-05-29 ENCOUNTER — Ambulatory Visit: Payer: PPO | Admitting: Cardiovascular Disease

## 2020-05-29 VITALS — BP 130/72 | HR 51 | Ht 66.0 in | Wt 170.2 lb

## 2020-05-29 DIAGNOSIS — I350 Nonrheumatic aortic (valve) stenosis: Secondary | ICD-10-CM

## 2020-05-29 DIAGNOSIS — I2510 Atherosclerotic heart disease of native coronary artery without angina pectoris: Secondary | ICD-10-CM | POA: Diagnosis not present

## 2020-05-29 DIAGNOSIS — N1832 Chronic kidney disease, stage 3b: Secondary | ICD-10-CM

## 2020-05-29 DIAGNOSIS — I6523 Occlusion and stenosis of bilateral carotid arteries: Secondary | ICD-10-CM | POA: Diagnosis not present

## 2020-05-29 DIAGNOSIS — Z951 Presence of aortocoronary bypass graft: Secondary | ICD-10-CM | POA: Diagnosis not present

## 2020-05-29 DIAGNOSIS — E039 Hypothyroidism, unspecified: Secondary | ICD-10-CM | POA: Diagnosis not present

## 2020-05-29 DIAGNOSIS — E785 Hyperlipidemia, unspecified: Secondary | ICD-10-CM

## 2020-05-29 MED ORDER — LOSARTAN POTASSIUM 50 MG PO TABS: 50.0000 mg | ORAL_TABLET | Freq: Every day | ORAL | 3 refills | Status: DC

## 2020-05-29 NOTE — Patient Instructions (Signed)
Medication Instructions:  Decrease Losartan to 50 mg daily Continue all other medications *If you need a refill on your cardiac medications before your next appointment, please call your pharmacy*   Lab Work: None ordered   Testing/Procedures: Schedule Echo in August   Follow-Up: At [facility], you and your health needs are our priority.  As part of our continuing mission to provide you with exceptional heart care, we have created designated Provider Care Teams.  These Care Teams include your primary Cardiologist (physician) and Advanced Practice Providers (APPs -  Physician Assistants and Nurse Practitioners) who all work together to provide you with the care you need, when you need it.  We recommend signing up for the patient portal called "MyChart".  Sign up information is provided on this After Visit Summary.  MyChart is used to connect with patients for Virtual Visits (Telemedicine).  Patients are able to view lab/test results, encounter notes, upcoming appointments, etc.  Non-urgent messages can be sent to your provider as well.   To learn more about what you can do with MyChart, go to [email or web address].    Your next appointment: August or Sept after Echo    The format for your next appointment: Office   Provider:  [facility]

## 2020-05-29 NOTE — Progress Notes (Signed)
Patient ID: Edger Husain, male   DOB: [birth year], 87 y.o.   MRN: [id number]     HPI: Baldwin Racicot, is a 87 y.o. male who presents to the office today for a 6 month follow-up cardiology evaluation.  In November 2009 Mr. Cuevas underwent emergent CABG revascularization surgery after cardiac catheterization revealed severe life-threatening anatomy with 95% ostial left main stenosis a 99% ostial RCA stenosis. Surgery was done by Dr. Cyndia Bent and he had a LIMA to the LAD, vein to the obtuse marginal, vein to the RCA. His last nuclear perfusion study in April 2012 continued to show normal perfusion.  Mr. Carriere has documented carotid disease. A carotid Doppler study  in November 2013 which showed at least 60% stenosis in his carotid arteries bilaterally which was slightly increased from previously. A f/u carotid evaluation last year demonstrated his peak right internal carotid systolic velocity 160 slightly increased from 240 in his left PICA systolic velocity up to 11 slightly increased from 200; 50-69% diameter reduction range bilaterally and was not significantly changed from one year ago.  On 12/01/2013 a follow-up study demonstrated a peak PICA velocity was now 737 with diastolic velocity at 42 and the right carotid and 218 and 61 in the left carotid.  He remains asymptomatic and these place him in the upper end of scale in the 50-69% range  Additional problems include mixed hyperlipidemia and hypertension.  He has been on Toprol-XL 50 mg and losartan 100 mg in addition to amlodipine 5 mg for blood pressure control.. In the past he had derived marked benefit with Niaspan  as well as [name]. Follow-up laboratory on his current dose of [name] and niacin 1000 mg  revealed a total cholesterol 145, triglycerides 70, HDL 64, and LDL 67.  His glucose was 111.  TSH 4.5.  He had normal renal function with a BUN of 19 and creatinine of 1.1.  He underwent echo Doppler study on 06/06/2014.  This showed an  ejection fraction at 55-60%.  There was a small systolic gradient across his aortic valve with moderately calcified leaflets.  Valve area was 1.6 cm.  He had a mean gradient of 11 and a peak gradient of 23 mm suggestive of mild aortic stenosis.  PA pressure was 31 mm.  A nuclear perfusion study which remained normal with an ejection fraction of 59% and evidence for normal perfusion.  He underwent a F/U 2-D echo Doppler study on 08/20/2015 which showed an EF of 60-65%.  The aortic valve was calcified and thickened with mildly restricted motion.  Peak and mean gradients were 25 and 14 mm consistent with mild aortic stenosis with a valve area of 1.42 cm.  I scheduled him for follow-up carotid duplex exam which was done in July 2017.  This suggested progression of his right internal carotid stenoses with velocity now at 465/131, which places him in the greater than 80% range.  He had stable left internal carotid velocities now or in the 60-79% range.  He had normal subclavian arteries bilaterally, and patent vertebral arteries with antegrade flow.  I referred him to Dr. [name] who felt that with his asymptomatic status and low risk assessment that he should undergo carotid endarterectomy.  He was referred to Dr. [name] for an office evaluation but this has not yet been scheduled.  He was hospitalized in December 2017 with community-acquired pneumonia of the left lower lung and possible empyema.  He underwent thoracentesis as well as a VATS procedure  with decortication and left empyema drainage.  His carotid surgery with Dr. [name] was ultimately postponed but was successfully done on 03/26/2016 with right carotid endarterectomy.  Intraoperative findings included a 95% stenosis.  He was discharged the following day.    I saw him in October 2018 at which time he was doing well without chest pain, PND, orthopnea. He denies paresthesias, presyncope or syncope.  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   He had his fifth hole in one of his career on a par 3.  He recently had blood work done in [place] by his primary physician.  Renal function was stable, although potassium was upper normal at 5.2.  Lipid studies revealed cholesterol 148, triglycerides 134, HDL 52, LDL 69.  Thyroid function studies were normal.  Hemoglobin A1c was elevated at 6.4 and his estimated average glucose was 137.  He underwent a follow-up echo Doppler study in 11/04/2016.  This continued to show hyperdynamic LV function with an EF of 65-70% with moderate LVH.  Wall motion was normal.  His aortic valve gradient have slightly increased over the year, such that his peak gradient increased from 25 to 38 mm, and his mean gradient from 14 mm to 20 mmHg. Aortic valve area was 1.57 cm placing him still in the mild to mild to moderate AS category.    He had follow-up carotid imaging with Dr. [name] which showed a patent carotid endarterectomy site with no evidence for restenosis or hyperplasia.  Left internal carotid artery velocities suggested a 40 to 59% stenosis.  When I last saw him in September 2019 he denied any chest pain, presyncope or syncope or any symptoms of heart failure.  I reviewed recent laboratory done by his primary physician on March 04, 2017.  Chemistry was stable.  Total cholesterol was 125, triglycerides 73, HDL 49, and LDL 61.  TSH is 1.77.  Free T3 was 3.07 and free T4 1.0.  He was not anemic.  HbA1c 6.2  Mr. Mauriello underwent an evaluation by Jory Sims, NP in December 2019 after developing some mild irregularity to his heart rate was noted by his primary physician.  He was felt to have occasional to frequent PACs and had bigeminal PACs.  This ultimately resolved on its own.  On April 01, 2018 he underwent a follow-up echo Doppler study which continued to show normal LV function with an EF of 55 to 60%.  There was mitral annular calcification.  His aortic stenosis was in the moderate range with a mean gradient  now at 25 and a peak instantantaneous gradient at 41 with a valve area of 1.3 cm.  When that evaluation he remained asymptomatic and specifically denied chest pain, PND, orthopnea, presyncope or syncope.   I last saw him in September 2020 at which time he remained asymptomatic.  His wife had recently under gone knee surgery in August and they have been staying local instead of going down to [place].  He continues to deny any episodes of chest pain, change in exercise tolerance, palpitations, presyncope or syncope, or CHF symptomatology.  He is unaware of any palpitations.    Mr. Brow underwent a 1 year follow-up echo Doppler study on April 18, 2019.  LV function remains normal with EF 60 to 65%.  There was grade 2 diastolic dysfunction and mild LVH.  He had moderately elevated pulmonary artery systolic pressure at 44 mm.  There was moderate aortic stenosis with a mean gradient of 24 and  peak instantaneous gradient at 39 mmHg.  Aortic valve area was 1.04 cm.  There was mild aortic insufficiency.  When I saw him in April 2021 he remained asymptomatic and denied any chest pain or shortness of breath.  He denied any exertional dyspnea.   He had laboratory checked in February 2021 which showed a total cholesterol 138 HDL 57 LDL 62 and triglycerides 94.  During that evaluation I reviewed his echo Doppler with him in detail and his gradients had not significantly changed from prior evaluation.  I last saw him in November 2021.  Since his prior evaluation he continued to do well. 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  He played golf 6 times without chest pain or shortness of breath.  He states his heart rate typically runs in the 50s.  He denies any chest pain, dizziness or shortness of breath.  During that evaluation his blood pressure was mildly elevated and he was on amlodipine 5 mg, losartan 100 mg in addition to metoprolol succinate 50 mg.  He was mildly bradycardic  without symptoms.  In April 2022 I recommended follow-up echo Doppler study to reassess his aortic valve stenosis.  He was continuing DAPT therapy with aspirin/Plavix and was on levothyroxine for hypothyroidism.  Since his last evaluation, he underwent follow-up carotid duplex imaging and was seen at [facility] by Laurence Slate, PA-C.  He is status post right carotid endarterectomy in February 2015.  Carotid studies were reviewed which showed mild 1 to 39% stenoses bilaterally.  He had normal vertebral and subclavian flow.  He underwent an echo Doppler study on May 10, 2020.  This continued to show normal EF of 55 to 60%.  There was mild LVH.  There were no wall motion abnormalities.  He had severe mitral annular calcification without mitral stenosis.  His mean aortic valve gradient had increased to 40.4 mmHg with a peak gradient at 70.4.  Aortic valve area was 1.02 cm.  There was mild dilation of his ascending aorta at 36 mm..  Presently, he denies any chest pain.  He continues to play golf regularly and denies any change in exercise capacity while playing golf.  However, if he attempts to walk very fast he does note some very mild shortness of breath.  He is unaware of palpitations.  His blood pressure has been stable.  He had undergone follow-up lipid studies on April 26, 2020 which showed an LDL at 59 total cholesterol 131 and triglycerides at 88 with HDL at 54.  His most recent creatinine had increased to 1.67. He presents for follow-up evaluation.  Past Medical History:  Diagnosis Date  . CAD (coronary artery disease)   . Heart murmur   . History of hiatal hernia   . Hypertension   . Hypothyroidism   . Pneumonia 12/2015   hx  . S/P CABG x 3 12/17/07   LIMA to LAD,SVG to left C    Past Surgical History:  Procedure Laterality Date  . CORONARY ARTERY BYPASS GRAFT  12/17/07   LIMA to LAD,vein to obtuse marginal,vein to RCA  . EMPYEMA DRAINAGE Left 01/11/2016   Procedure: EMPYEMA DRAINAGE;   Surgeon: Ivin Poot, MD;  Location: [place];  Service: Thoracic;  Laterality: Left;  . ENDARTERECTOMY Right 03/26/2016   Procedure: RIGHT CAROTID ENDARTERECTOMY;  Surgeon: Serafina Mitchell, MD;  Location: [facility] [address];  Service: Vascular;  Laterality: Right;  . PATCH ANGIOPLASTY Right 03/26/2016   Procedure: PATCH ANGIOPLASTY  [place];  Surgeon: Serafina Mitchell, MD;  Location: [place];  Service: Vascular;  Laterality: Right;  Marland Kitchen VIDEO ASSISTED THORACOSCOPY (VATS)/DECORTICATION Left 01/11/2016   Procedure: VIDEO ASSISTED THORACOSCOPY (VATS)/DECORTICATION;  Surgeon: Ivin Poot, MD;  Location: [place];  Service: Thoracic;  Laterality: Left;    No Known Allergies  Current Outpatient Medications  Medication Sig Dispense Refill  . amLODipine (NORVASC) 5 MG tablet TAKE 1 TABLET DAILY. 90 tablet 2  . aspirin EC 81 MG tablet Take 81 mg by mouth at bedtime.     . Cholecalciferol (VITAMIN D) 2000 units tablet Take 2,000 Units by mouth at bedtime.    . clopidogrel (PLAVIX) 75 MG tablet Take 1 tablet by mouth daily 90 tablet 1  . fexofenadine (ALLEGRA) 180 MG tablet Take 180 mg by mouth daily.    . folic acid (FOLVITE) 1 MG tablet Take 1 tablet (1 mg total) by mouth daily. 90 tablet 2  . levothyroxine (SYNTHROID, LEVOTHROID) 112 MCG tablet Take 112 mcg by mouth daily.    Marland Kitchen losartan (COZAAR) 50 MG tablet Take 1 tablet (50 mg total) by mouth daily. 90 tablet 3  . metoprolol succinate (TOPROL-XL) 50 MG 24 hr tablet Take 1 tablet by mouth once daily with or immediately following a meal (please schedule an appointment for future refills) 90 tablet 3  . mupirocin cream (BACTROBAN) 2 % Apply 1 application topically daily as needed (after skin treatments).     . Omega-3 1400 MG CAPS Take by mouth daily.    . rosuvastatin (CRESTOR) 40 MG tablet Take 1 tablet by mouth every day 90 tablet 3  . terbinafine (LAMISIL) 1 % cream Apply 1 application topically daily as needed (rash).      No current  facility-administered medications for this visit.    Socially he is married. There are no children. He does remain active. He does walk. There is no tobacco use. He does drink occasional alcohol.  ROS General: Negative; No fevers, chills, or night sweats;  HEENT: Negative; No changes in vision or hearing, sinus congestion, difficulty swallowing Pulmonary: Negative; No cough, wheezing, shortness of breath, hemoptysis Cardiovascular:  See HPI GI: Negative; No nausea, vomiting, diarrhea, or abdominal pain GU: Negative; No dysuria, hematuria, or difficulty voiding Musculoskeletal: Negative; no myalgias, joint pain, or weakness Hematologic/Oncology: Negative; no easy bruising, bleeding Endocrine: Positive for hypothyroidism on Synthroid replacement. Neuro: Negative; no changes in balance, headaches Skin: Negative; No rashes or skin lesions Psychiatric: Negative; No behavioral problems, depression Sleep: Negative; No snoring, daytime sleepiness, hypersomnolence, bruxism, restless legs, hypnogognic hallucinations, no cataplexy Other comprehensive 14 point system review is negative.   PE BP 130/72   Pulse (!) 51   Ht $R'5\' 6"'kA$  (1.676 m)   Wt 170 lb 4 oz (77.2 kg)   SpO2 96%   BMI 27.48 kg/m    Repeat blood pressure by me was 130/70  Wt Readings from Last 3 Encounters:  05/29/20 170 lb 4 oz (77.2 kg)  03/05/20 174 lb 11.2 oz (79.2 kg)  12/27/19 170 lb 9.6 oz (77.4 kg)   General: Alert, oriented, no distress.  Skin: normal turgor, no rashes, warm and dry HEENT: Normocephalic, atraumatic. Pupils equal round and reactive to light; sclera anicteric; extraocular muscles intact;  Nose without nasal septal hypertrophy Mouth/Parynx benign; Mallinpatti scale 3 Neck: Status post right carotid endarterectomy; transmitted murmur more prominent left carotid;  no JVD Lungs: clear to ausculatation and percussion; no wheezing or rales Chest wall: without  tenderness to palpitation Heart: PMI not  displaced, RRR, s1 s2 normal, 2/6 mid to late peaking systolic murmur, no diastolic murmur, no rubs, gallops, thrills, or heaves Abdomen: soft, nontender; no hepatosplenomehaly, BS+; abdominal aorta nontender and not dilated by palpation. Back: no CVA tenderness Pulses 2+ Musculoskeletal: full range of motion, normal strength, no joint deformities Extremities: no clubbing cyanosis or edema, Homan's sign negative  Neurologic: grossly nonfocal; Cranial nerves grossly wnl Psychologic: Normal mood and affect  ECG (independently read by me): Sinus bradycardia 51 bpm, PACs, first-degree AV block with a PR 212 ms.  There is no evidence for LV strain or LVH.  November 2021 ECG (independently read by me): Sinus bradycardia at 48 bpm, first-degree AV block, PR 216 ms.  No ectopy.  No ECG changes.  QTc interval normal at 398 ms  April14, 2021 ECG (independently read by me): Sinus bradycardia at 54 bpm with mild sinus arrhythmia.  First-degree AV block with a PR interval at 214 ms.  No ectopy.  September 2020 ECG (independently read by me): Sinus bradycardia 54 bpm, isolated PAC, RV conduction delay/incomplete right bundle branch block.  PR interval 202 ms.  March 2020 ECG (independently read by me): Sinus rhythm at 60 bpm with mild sinus arrhythmia.  Normal intervals  September 2019 ECG (independently read by me): Sinus bradycardia at 51 bpm.  Mild RV conduction delay.  No significant ST changes.  PR interval 200 ms.  October 2018 ECG (independently read by me): Normal sinus rhythm with PACs.  PR interval 200 ms, QTc interval 412 ms.  April 2018 ECG (independently read by me): Normal sinus rhythm with sinus arrhythmia with an average heart rate at 60 bpm.  Normal intervals.  Small inferior Q waves.  February 2017 ECG (independently read by me): Sinus bradycardia with mild sinus arrhythmia at 58 bpm.  Mild RV conduction delay.  No significant ST segment changes.  July 2016 ECG (independently read by  me): Sinus bradycardia with mild sinus arrhythmia, heart rate ranging from 48-58.  Prior December 2014 ECG: Normal sinus rhythm at 54 beats per minute; normal intervals.  LABS: I personally reviewed the blood work  from [facility] done on 07/19/2015.   Total cholesterol 142, triglycerides 120, HDL 61, LDL 57.  I extensively reviewed laboratory from [facility] primary care from 08/29/2016  I personally reviewed the laboratory as noted above from February 2019 Total cholesterol 125, triglycerides 73, HDL 49, LDL 61.  Personally reviewed lab work from March 22, 2019.  BMP Latest Ref Rng & Units 03/27/2016 03/26/2016 03/18/2016  Glucose 65 - 99 mg/dL 99 - 96  BUN 6 - 20 mg/dL 16 - 17  Creatinine 0.61 - 1.24 mg/dL 1.08 0.96 1.06  Sodium 135 - 145 mmol/L 137 - 135  Potassium 3.5 - 5.1 mmol/L 4.0 - 4.5  Chloride 101 - 111 mmol/L 108 - 103  CO2 22 - 32 mmol/L 24 - 24  Calcium 8.9 - 10.3 mg/dL 8.3(L) - 9.9   Hepatic Function Latest Ref Rng & Units 03/18/2016 01/15/2016 01/13/2016  Total Protein 6.5 - 8.1 g/dL 7.7 5.6(L) 5.8(L)  Albumin 3.5 - 5.0 g/dL 4.2 1.7(L) 1.5(L)  AST 15 - 41 U/L 22 53(H) 39  ALT 17 - 63 U/L 16(L) 56 60  Alk Phosphatase 38 - 126 U/L 70 116 128(H)  Total Bilirubin 0.3 - 1.2 mg/dL 0.6 0.7 0.5  Bilirubin, Direct 0.1 - 0.5 mg/dL - - -   CBC Latest Ref Rng & Units 03/27/2016 03/26/2016 03/18/2016  WBC 4.0 - 10.5 K/uL 7.3 8.0 6.8  Hemoglobin 13.0 - 17.0 g/dL 10.5(L) 11.4(L) 12.5(L)  Hematocrit 39.0 - 52.0 % 31.5(L) 33.4(L) 38.2(L)  Platelets 150 - 400 K/uL 175 184 226   Lab Results  Component Value Date   MCV 94.6 03/27/2016   MCV 93.8 03/26/2016   MCV 94.8 03/18/2016   Lab Results  Component Value Date   TSH 4.322 01/10/2016   Lab Results  Component Value Date   HGBA1C 7.2 (H) 01/10/2016     Lipid Panel     Component Value Date/Time   CHOL 145 01/18/2014 0803   CHOL 139 08/30/2012 0845   TRIG 70 01/18/2014 0803   TRIG 82 08/30/2012 0845   HDL 64 01/18/2014 0803    HDL 68 08/30/2012 0845   CHOLHDL 2.3 01/18/2014 0803   VLDL 14 01/18/2014 0803   LDLCALC 67 01/18/2014 0803   LDLCALC 55 08/30/2012 0845   IMPRESSION:  1. Nonrheumatic aortic valve stenosis   2. CAD in native artery   3. Hx of CABG: November 2009   4. Hyperlipidemia with target LDL less than 70   5. Stage 3b chronic kidney disease ([place])   6. Carotid artery stenosis status post right carotid endarterectomy 03/26/2016   7. Hypothyroidism, unspecified type     ASSESSMENT AND PLAN: Mr.Hildenbrand is an 87 year old gentleman who underwent emergent CABG revascularization surgery for life-threatening coronary anatomy in November 2009.  He has a history of peripheral vascular disease and underwent successful right carotid endarterectomy by Dr. [name].  Most recent carotid studies from February 2022 were reviewed with prior studies and continue to show stable velocities in the 1 to 39% range bilaterally with normal vertebral and subclavian flow.  Mr. Brueckner continues to feel well and denies any chest pain palpitations or significant change in activity.  He continues to play golf where he walks up and down hills without dyspnea.  However if he does overextend himself as he has noticed some very minimal shortness of breath compared to previously.  I reviewed his most recent echo Doppler study from April 2022.  Over the years his aortic valve gradient has progressed and his aortic valve stenosis is now in the moderate severe range.  His most recent mean gradient however was 40 mm with a peak gradient of 70 and aortic valve area of approximately 1 cm.  I discussed the progression of his aortic stenosis with him in detail and discussed the potential need for aortic valve surgery.  Presently, he feels he is doing well.  He would prefer to continue close observation.  He is not having any anginal symptoms.  His pulse is bradycardic at 51.  Blood pressure today is stable on amlodipine 5 mg metoprolol succinate 50 mg  daily in addition to his losartan 100 mg.  With his creatinine recently increasing to 1.67 on April 26, 2020 I have recommended slight reduction of losartan to 50 mg.  LDL cholesterol remains excellent and he continues to be on rosuvastatin 40 mg daily.  He continues to be on DAPT and is tolerating this well.  He is on levothyroxine 112 mcg and most recent TSH was 4.51.  I have recommended a follow-up echo Doppler study in 4 to 5 months and I will see him back in the office for follow-up evaluation.  He will contact our office if he notes any change in symptomatology.  Troy Sine, MD, [facility]  05/31/2020 10:19 AM

## 2020-05-31 ENCOUNTER — Encounter: Payer: Self-pay | Admitting: Cardiovascular Disease

## 2020-06-06 NOTE — Addendum Note (Signed)
Addended by: Lubertha Sayres on: 06/06/2020 07:16 AM   Modules accepted: Orders

## 2020-06-12 DIAGNOSIS — N289 Disorder of kidney and ureter, unspecified: Secondary | ICD-10-CM | POA: Diagnosis not present

## 2020-06-12 DIAGNOSIS — D649 Anemia, unspecified: Secondary | ICD-10-CM | POA: Diagnosis not present

## 2020-06-21 ENCOUNTER — Other Ambulatory Visit: Payer: Self-pay | Admitting: Cardiovascular Disease

## 2020-07-31 DIAGNOSIS — E039 Hypothyroidism, unspecified: Secondary | ICD-10-CM | POA: Diagnosis not present

## 2020-07-31 DIAGNOSIS — N1832 Chronic kidney disease, stage 3b: Secondary | ICD-10-CM | POA: Diagnosis not present

## 2020-07-31 DIAGNOSIS — E785 Hyperlipidemia, unspecified: Secondary | ICD-10-CM | POA: Diagnosis not present

## 2020-07-31 DIAGNOSIS — I129 Hypertensive chronic kidney disease with stage 1 through stage 4 chronic kidney disease, or unspecified chronic kidney disease: Secondary | ICD-10-CM | POA: Diagnosis not present

## 2020-07-31 DIAGNOSIS — D539 Nutritional anemia, unspecified: Secondary | ICD-10-CM | POA: Diagnosis not present

## 2020-08-13 DIAGNOSIS — N281 Cyst of kidney, acquired: Secondary | ICD-10-CM | POA: Diagnosis not present

## 2020-08-13 DIAGNOSIS — N261 Atrophy of kidney (terminal): Secondary | ICD-10-CM | POA: Diagnosis not present

## 2020-08-13 DIAGNOSIS — N1832 Chronic kidney disease, stage 3b: Secondary | ICD-10-CM | POA: Diagnosis not present

## 2020-08-23 ENCOUNTER — Other Ambulatory Visit: Payer: Self-pay | Admitting: Cardiovascular Disease

## 2020-09-06 ENCOUNTER — Ambulatory Visit (HOSPITAL_COMMUNITY): Payer: PPO | Attending: Cardiology

## 2020-09-06 ENCOUNTER — Other Ambulatory Visit: Payer: Self-pay

## 2020-09-06 DIAGNOSIS — I350 Nonrheumatic aortic (valve) stenosis: Secondary | ICD-10-CM | POA: Insufficient documentation

## 2020-09-06 LAB — ECHOCARDIOGRAM COMPLETE
AR max vel: 1.03 cm2
AV Area VTI: 0.98 cm2
AV Area mean vel: 0.94 cm2
AV Mean grad: 47.5 mmHg
AV Peak grad: 77.1 mmHg
Ao pk vel: 4.39 m/s
Area-P 1/2: 2.37 cm2
P 1/2 time: 533 msec
S' Lateral: 3.4 cm

## 2020-09-24 ENCOUNTER — Other Ambulatory Visit: Payer: Self-pay

## 2020-09-24 ENCOUNTER — Ambulatory Visit: Payer: PPO | Admitting: Cardiovascular Disease

## 2020-09-24 ENCOUNTER — Encounter: Payer: Self-pay | Admitting: Cardiovascular Disease

## 2020-09-24 VITALS — BP 158/66 | HR 51 | Ht 66.0 in | Wt 172.0 lb

## 2020-09-24 DIAGNOSIS — I6523 Occlusion and stenosis of bilateral carotid arteries: Secondary | ICD-10-CM | POA: Diagnosis not present

## 2020-09-24 DIAGNOSIS — N1832 Chronic kidney disease, stage 3b: Secondary | ICD-10-CM

## 2020-09-24 DIAGNOSIS — I350 Nonrheumatic aortic (valve) stenosis: Secondary | ICD-10-CM

## 2020-09-24 DIAGNOSIS — E039 Hypothyroidism, unspecified: Secondary | ICD-10-CM

## 2020-09-24 DIAGNOSIS — E785 Hyperlipidemia, unspecified: Secondary | ICD-10-CM | POA: Diagnosis not present

## 2020-09-24 DIAGNOSIS — Z951 Presence of aortocoronary bypass graft: Secondary | ICD-10-CM | POA: Diagnosis not present

## 2020-09-24 DIAGNOSIS — I2510 Atherosclerotic heart disease of native coronary artery without angina pectoris: Secondary | ICD-10-CM | POA: Diagnosis not present

## 2020-09-24 NOTE — Patient Instructions (Addendum)
Medication Instructions:  Continue current medications  *If you need a refill on your cardiac medications before your next appointment, please call your pharmacy*   Lab Work: None Ordered   Testing/Procedures: Your physician has requested that you have an echocardiogram in 6 Months. Echocardiography is a painless test that uses sound waves to create images of your heart. It provides your doctor with information about the size and shape of your heart and how well your heart's chambers and valves are working. This procedure takes approximately one hour. There are no restrictions for this procedure.    Follow-Up: At [facility], you and your health needs are our priority.  As part of our continuing mission to provide you with exceptional heart care, we have created designated Provider Care Teams.  These Care Teams include your primary Cardiologist (physician) and Advanced Practice Providers (APPs -  Physician Assistants and Nurse Practitioners) who all work together to provide you with the care you need, when you need it.  We recommend signing up for the patient portal called "MyChart".  Sign up information is provided on this After Visit Summary.  MyChart is used to connect with patients for Virtual Visits (Telemedicine).  Patients are able to view lab/test results, encounter notes, upcoming appointments, etc.  Non-urgent messages can be sent to your provider as well.   To learn more about what you can do with MyChart, go to [email or web address].    Your next appointment:   6 month(s)  The format for your next appointment:   In Person  Provider:   You may see Shelva Majestic, MD or one of the following Advanced Practice Providers on your designated Care Team:   Almyra Deforest, PA-C Fabian Sharp, [place] or  Roby Lofts, [place]   Other Instructions If Heart rate is in low 50s reduced metoprolol Succinate from 1 tablet to half(1/2) tablet

## 2020-09-24 NOTE — Progress Notes (Signed)
Patient ID: Dillon [name], male   DOB: 06-04-1933, 87 y.o.   MRN: [id number]     HPI: Dillon [name], is a 87 y.o. male who presents to the office today for a 3 month follow-up cardiology evaluation.  In November 2009 Dillon [name] underwent emergent CABG revascularization surgery after cardiac catheterization revealed severe life-threatening anatomy with 95% ostial left main stenosis a 99% ostial RCA stenosis. Surgery was done by Dr. Cyndia Bent and he had a LIMA to the LAD, vein to the obtuse marginal, vein to the RCA. His last nuclear perfusion study in April 2012 continued to show normal perfusion.  Dillon [name] has documented carotid disease. A carotid Doppler study  in November 2013 which showed at least 60% stenosis in his carotid arteries bilaterally which was slightly increased from previously. A f/u carotid evaluation last year demonstrated his peak right internal carotid systolic velocity 893 slightly increased from 240 in his left PICA systolic velocity up to 11 slightly increased from 200; 50-69% diameter reduction range bilaterally and was not significantly changed from one year ago.  On 12/01/2013 a follow-up study demonstrated a peak PICA velocity was now 810 with diastolic velocity at 42 and the right carotid and 218 and 61 in the left carotid.  He remains asymptomatic and these place him in the upper end of scale in the 50-69% range  Additional problems include mixed hyperlipidemia and hypertension.  He has been on Toprol-XL 50 mg and losartan 100 mg in addition to amlodipine 5 mg for blood pressure control.. In the past he had derived marked benefit with Niaspan  as well as [name]. Follow-up laboratory on his current dose of [name] and niacin 1000 mg  revealed a total cholesterol 145, triglycerides 70, HDL 64, and LDL 67.  His glucose was 111.  TSH 4.5.  He had normal renal function with a BUN of 19 and creatinine of 1.1.  He underwent echo Doppler study on 06/06/2014.  This showed an  ejection fraction at 55-60%.  There was a small systolic gradient across his aortic valve with moderately calcified leaflets.  Valve area was 1.6 cm.  He had a mean gradient of 11 and a peak gradient of 23 mm suggestive of mild aortic stenosis.  PA pressure was 31 mm.  A nuclear perfusion study which remained normal with an ejection fraction of 59% and evidence for normal perfusion.  He underwent a F/U 2-D echo Doppler study on 08/20/2015 which showed an EF of 60-65%.  The aortic valve was calcified and thickened with mildly restricted motion.  Peak and mean gradients were 25 and 14 mm consistent with mild aortic stenosis with a valve area of 1.42 cm.  I scheduled him for follow-up carotid duplex exam which was done in July 2017.  This suggested progression of his right internal carotid stenoses with velocity now at 465/131, which places him in the greater than 80% range.  He had stable left internal carotid velocities now or in the 60-79% range.  He had normal subclavian arteries bilaterally, and patent vertebral arteries with antegrade flow.  I referred him to Dr. [name] who felt that with his asymptomatic status and low risk assessment that he should undergo carotid endarterectomy.  He was referred to Dr. [name] for an office evaluation but this has not yet been scheduled.  He was hospitalized in December 2017 with community-acquired pneumonia of the left lower lung and possible empyema.  He underwent thoracentesis as well as a VATS procedure  with decortication and left empyema drainage.  His carotid surgery with Dr. [name] was ultimately postponed but was successfully done on 03/26/2016 with right carotid endarterectomy.  Intraoperative findings included a 95% stenosis.  He was discharged the following day.    I saw him in October 2018 at which time he was doing well without chest pain, PND, orthopnea. He denies paresthesias, presyncope or syncope.  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   He had his fifth hole in one of his career on a par 3.  He recently had blood work done in [place] by his primary physician.  Renal function was stable, although potassium was upper normal at 5.2.  Lipid studies revealed cholesterol 148, triglycerides 134, HDL 52, LDL 69.  Thyroid function studies were normal.  Hemoglobin A1c was elevated at 6.4 and his estimated average glucose was 137.  He underwent a follow-up echo Doppler study in 11/04/2016.  This continued to show hyperdynamic LV function with an EF of 65-70% with moderate LVH.  Wall motion was normal.  His aortic valve gradient have slightly increased over the year, such that his peak gradient increased from 25 to 38 mm, and his mean gradient from 14 mm to 20 mmHg. Aortic valve area was 1.57 cm placing him still in the mild to mild to moderate AS category.    He had follow-up carotid imaging with Dr. [name] which showed a patent carotid endarterectomy site with no evidence for restenosis or hyperplasia.  Left internal carotid artery velocities suggested a 40 to 59% stenosis.  When I last saw him in September 2019 he denied any chest pain, presyncope or syncope or any symptoms of heart failure.  I reviewed recent laboratory done by his primary physician on March 04, 2017.  Chemistry was stable.  Total cholesterol was 125, triglycerides 73, HDL 49, and LDL 61.  TSH is 1.77.  Free T3 was 3.07 and free T4 1.0.  He was not anemic.  HbA1c 6.2  Dillon [name] underwent an evaluation by Jory Sims, NP in December 2019 after developing some mild irregularity to his heart rate was noted by his primary physician.  He was felt to have occasional to frequent PACs and had bigeminal PACs.  This ultimately resolved on its own.  On April 01, 2018 he underwent a follow-up echo Doppler study which continued to show normal LV function with an EF of 55 to 60%.  There was mitral annular calcification.  His aortic stenosis was in the moderate range with a mean gradient  now at 25 and a peak instantantaneous gradient at 41 with a valve area of 1.3 cm.  When that evaluation he remained asymptomatic and specifically denied chest pain, PND, orthopnea, presyncope or syncope.   I last saw him in September 2020 at which time he remained asymptomatic.  His wife had recently under gone knee surgery in August and they have been staying local instead of going down to [place].  He continues to deny any episodes of chest pain, change in exercise tolerance, palpitations, presyncope or syncope, or CHF symptomatology.  He is unaware of any palpitations.    Dillon [name] underwent a 1 year follow-up echo Doppler study on April 18, 2019.  LV function remains normal with EF 60 to 65%.  There was grade 2 diastolic dysfunction and mild LVH.  He had moderately elevated pulmonary artery systolic pressure at 44 mm.  There was moderate aortic stenosis with a mean gradient of 24 and  peak instantaneous gradient at 39 mmHg.  Aortic valve area was 1.04 cm.  There was mild aortic insufficiency.  When I saw him in April 2021 he remained asymptomatic and denied any chest pain or shortness of breath.  He denied any exertional dyspnea.   He had laboratory checked in February 2021 which showed a total cholesterol 138 HDL 57 LDL 62 and triglycerides 94.  During that evaluation I reviewed his echo Doppler with him in detail and his gradients had not significantly changed from prior evaluation.  I last saw him in November 2021.  Since his prior evaluation he continued to do well. He was at [facility] in [place] and is beach, [place] house.  He played golf 6 times without chest pain or shortness of breath.  He states his heart rate typically runs in the 50s.  He denies any chest pain, dizziness or shortness of breath.  During that evaluation his blood pressure was mildly elevated and he was on amlodipine 5 mg, losartan 100 mg in addition to metoprolol succinate 50 mg.  He was mildly bradycardic  without symptoms.  In April 2022 I recommended follow-up echo Doppler study to reassess his aortic valve stenosis.  He was continuing DAPT therapy with aspirin/Plavix and was on levothyroxine for hypothyroidism.  He underwent follow-up carotid duplex imaging and was seen at [facility] by Clinton Gallant, PA-C.  He is status post right carotid endarterectomy in February 2015.  Carotid studies were reviewed which showed mild 1 to 39% stenoses bilaterally.  He had normal vertebral and subclavian flow.  He underwent an echo Doppler study on May 10, 2020.  This continued to show normal EF of 55 to 60%.  There was mild LVH.  There were no wall motion abnormalities.  He had severe mitral annular calcification without mitral stenosis.  His mean aortic valve gradient had increased to 40.4 mmHg with a peak gradient at 70.4.  Aortic valve area was 1.02 cm.  There was mild dilation of his ascending aorta at 36 mm..  I last saw him May 2022.  At that time he remained completely asymptomatic. He was continuing to play golf regularly and denies any change in exercise capacity while playing golf.  However, if he attempts to walk very fast he does note some very mild shortness of breath.  He is unaware of palpitations.  His blood pressure has been stable.  He had undergone follow-up lipid studies on April 26, 2020 which showed an LDL at 59 total cholesterol 131 and triglycerides at 88 with HDL at 54.  His most recent creatinine had increased to 1.67.  During that evaluation I had an extensive discussion with him concerning his April 2022 echo Doppler findings regarding his aortic stenosis and I spent considerable time reviewing the natural history of aortic stenosis and potential progression.  Since I last saw him, he continues to be symptom-free.  I had recommended he undergo a follow-up echo Doppler study which was done on September 06, 2020.  Ejection fraction continues to be excellent at 60 to 65%.  There is mild LVH and mild grade  1 diastolic dysfunction with elevated left atrial pressure.  His mean aortic gradient has increased from 40 to 47 mm and his peak instantaneous gradient from 70 to 77 mm.  Estimated aortic valve area is 0.98 cm2.  He presents for follow-up evaluation.   Past Medical History:  Diagnosis Date   CAD (coronary artery disease)    Heart murmur  History of hiatal hernia    Hypertension    Hypothyroidism    Pneumonia 12/2015   hx   S/P CABG x 3 12/17/07   LIMA to LAD,SVG to left C    Past Surgical History:  Procedure Laterality Date   CORONARY ARTERY BYPASS GRAFT  12/17/07   LIMA to LAD,vein to obtuse marginal,vein to RCA   EMPYEMA DRAINAGE Left 01/11/2016   Procedure: EMPYEMA DRAINAGE;  Surgeon: Ivin Poot, MD;  Location: [facility];  Service: Thoracic;  Laterality: Left;   ENDARTERECTOMY Right 03/26/2016   Procedure: RIGHT CAROTID ENDARTERECTOMY;  Surgeon: Serafina Mitchell, MD;  Location: [facility];  Service: Vascular;  Laterality: Right;   PATCH ANGIOPLASTY Right 03/26/2016   Procedure: PATCH ANGIOPLASTY USING Rueben Bash BIOLOGIC PATCH;  Surgeon: Serafina Mitchell, MD;  Location: [facility];  Service: Vascular;  Laterality: Right;   VIDEO ASSISTED THORACOSCOPY (VATS)/DECORTICATION Left 01/11/2016   Procedure: VIDEO ASSISTED THORACOSCOPY (VATS)/DECORTICATION;  Surgeon: Ivin Poot, MD;  Location: [facility];  Service: Thoracic;  Laterality: Left;    No Known Allergies  Current Outpatient Medications  Medication Sig Dispense Refill   amLODipine (NORVASC) 5 MG tablet Take 1 tablet by mouth daily 90 tablet 3   aspirin EC 81 MG tablet Take 81 mg by mouth at bedtime.      Cholecalciferol (VITAMIN D) 2000 units tablet Take 2,000 Units by mouth at bedtime.     clopidogrel (PLAVIX) 75 MG tablet Take 1 tablet by mouth daily 90 tablet 1   fexofenadine (ALLEGRA) 180 MG tablet Take 180 mg by mouth daily.     folic acid (FOLVITE) 1 MG tablet Take 1 tablet by mouth daily 90 tablet 1   levothyroxine (SYNTHROID,  LEVOTHROID) 112 MCG tablet Take 112 mcg by mouth daily.     losartan (COZAAR) 50 MG tablet Take 1 tablet (50 mg total) by mouth daily. 90 tablet 3   metoprolol succinate (TOPROL-XL) 50 MG 24 hr tablet Take 1 tablet by mouth once daily with or immediately following a meal (please schedule an appointment for future refills) 90 tablet 3   mupirocin cream (BACTROBAN) 2 % Apply 1 application topically daily as needed (after skin treatments).      Omega-3 1400 MG CAPS Take by mouth daily.     rosuvastatin (CRESTOR) 40 MG tablet Take 1 tablet by mouth every day 90 tablet 3   terbinafine (LAMISIL) 1 % cream Apply 1 application topically daily as needed (rash).      No current facility-administered medications for this visit.    Socially he is married. There are no children. He does remain active. He does walk. There is no tobacco use. He does drink occasional alcohol.  ROS General: Negative; No fevers, chills, or night sweats;  HEENT: Negative; No changes in vision or hearing, sinus congestion, difficulty swallowing Pulmonary: Negative; No cough, wheezing, shortness of breath, hemoptysis Cardiovascular:  See HPI GI: Negative; No nausea, vomiting, diarrhea, or abdominal pain GU: Negative; No dysuria, hematuria, or difficulty voiding Musculoskeletal: Negative; no myalgias, joint pain, or weakness Hematologic/Oncology: Negative; no easy bruising, bleeding Endocrine: Positive for hypothyroidism on Synthroid replacement. Neuro: Negative; no changes in balance, headaches Skin: Negative; No rashes or skin lesions Psychiatric: Negative; No behavioral problems, depression Sleep: Negative; No snoring, daytime sleepiness, hypersomnolence, bruxism, restless legs, hypnogognic hallucinations, no cataplexy Other comprehensive 14 point system review is negative.   PE BP (!) 158/66   Pulse (!) 51   Ht $R'5\' 6"'fe$  (1.676 m)  Wt 172 lb (78 kg)   BMI 27.76 kg/m    Repeat blood pressure by me was 130/70  Wt  Readings from Last 3 Encounters:  09/24/20 172 lb (78 kg)  05/29/20 170 lb 4 oz (77.2 kg)  03/05/20 174 lb 11.2 oz (79.2 kg)   General: Alert, oriented, no distress.  Skin: normal turgor, no rashes, warm and dry HEENT: Normocephalic, atraumatic. Pupils equal round and reactive to light; sclera anicteric; extraocular muscles intact;  Nose without nasal septal hypertrophy Mouth/Parynx benign; Mallinpatti scale 3 Neck: No JVD, no carotid bruits; normal carotid upstroke Lungs: clear to ausculatation and percussion; no wheezing or rales Chest wall: without tenderness to palpitation Heart: PMI not displaced, RRR, s1 s2 normal, 2/6 systolic murmur, no diastolic murmur, no rubs, gallops, thrills, or heaves Abdomen: soft, nontender; no hepatosplenomehaly, BS+; abdominal aorta nontender and not dilated by palpation. Back: no CVA tenderness Pulses 2+ Musculoskeletal: full range of motion, normal strength, no joint deformities Extremities: no clubbing cyanosis or edema, Homan's sign negative  Neurologic: grossly nonfocal; Cranial nerves grossly wnl Psychologic: Normal mood and affect   September 24, 2020 ECG (independently read by me): Sinus bradycardia at 51, 1 st degree AV block, PR 220 msec; QTc 401 msec  May 29, 2020 ECG (independently read by me): Sinus bradycardia 51 bpm, PACs, first-degree AV block with a PR 212 ms.  There is no evidence for LV strain or LVH.  November 2021 ECG (independently read by me): Sinus bradycardia at 48 bpm, first-degree AV block, PR 216 ms.  No ectopy.  No ECG changes.  QTc interval normal at 398 ms  April14, 2021 ECG (independently read by me): Sinus bradycardia at 54 bpm with mild sinus arrhythmia.  First-degree AV block with a PR interval at 214 ms.  No ectopy.  September 2020 ECG (independently read by me): Sinus bradycardia 54 bpm, isolated PAC, RV conduction delay/incomplete right bundle branch block.  PR interval 202 ms.  March 2020 ECG (independently read by  me): Sinus rhythm at 60 bpm with mild sinus arrhythmia.  Normal intervals  September 2019 ECG (independently read by me): Sinus bradycardia at 51 bpm.  Mild RV conduction delay.  No significant ST changes.  PR interval 200 ms.  October 2018 ECG (independently read by me): Normal sinus rhythm with PACs.  PR interval 200 ms, QTc interval 412 ms.  April 2018 ECG (independently read by me): Normal sinus rhythm with sinus arrhythmia with an average heart rate at 60 bpm.  Normal intervals.  Small inferior Q waves.  February 2017 ECG (independently read by me): Sinus bradycardia with mild sinus arrhythmia at 58 bpm.  Mild RV conduction delay.  No significant ST segment changes.  July 2016 ECG (independently read by me): Sinus bradycardia with mild sinus arrhythmia, heart rate ranging from 48-58.  December 2014 ECG: Normal sinus rhythm at 54 beats per minute; normal intervals.  LABS: I personally reviewed the blood work  from [facility] done on 07/19/2015.   Total cholesterol 142, triglycerides 120, HDL 61, LDL 57.  I extensively reviewed laboratory from [facility] primary care from 08/29/2016  I personally reviewed the laboratory as noted above from February 2019 Total cholesterol 125, triglycerides 73, HDL 49, LDL 61.  Personally reviewed lab work from March 22, 2019.  BMP Latest Ref Rng & Units 03/27/2016 03/26/2016 03/18/2016  Glucose 65 - 99 mg/dL 99 - 96  BUN 6 - 20 mg/dL 16 - 17  Creatinine 0.61 - 1.24 mg/dL 1.08 0.96  1.06  Sodium 135 - 145 mmol/L 137 - 135  Potassium 3.5 - 5.1 mmol/L 4.0 - 4.5  Chloride 101 - 111 mmol/L 108 - 103  CO2 22 - 32 mmol/L 24 - 24  Calcium 8.9 - 10.3 mg/dL 8.3(L) - 9.9   Hepatic Function Latest Ref Rng & Units 03/18/2016 01/15/2016 01/13/2016  Total Protein 6.5 - 8.1 g/dL 7.7 5.6(L) 5.8(L)  Albumin 3.5 - 5.0 g/dL 4.2 1.7(L) 1.5(L)  AST 15 - 41 U/L 22 53(H) 39  ALT 17 - 63 U/L 16(L) 56 60  Alk Phosphatase 38 - 126 U/L 70 116 128(H)  Total Bilirubin 0.3 - 1.2 mg/dL  0.6 0.7 0.5  Bilirubin, Direct 0.1 - 0.5 mg/dL - - -   CBC Latest Ref Rng & Units 03/27/2016 03/26/2016 03/18/2016  WBC 4.0 - 10.5 K/uL 7.3 8.0 6.8  Hemoglobin 13.0 - 17.0 g/dL 10.5(L) 11.4(L) 12.5(L)  Hematocrit 39.0 - 52.0 % 31.5(L) 33.4(L) 38.2(L)  Platelets 150 - 400 K/uL 175 184 226   Lab Results  Component Value Date   MCV 94.6 03/27/2016   MCV 93.8 03/26/2016   MCV 94.8 03/18/2016   Lab Results  Component Value Date   TSH 4.322 01/10/2016   Lab Results  Component Value Date   HGBA1C 7.2 (H) 01/10/2016     Lipid Panel     Component Value Date/Time   CHOL 145 01/18/2014 0803   CHOL 139 08/30/2012 0845   TRIG 70 01/18/2014 0803   TRIG 82 08/30/2012 0845   HDL 64 01/18/2014 0803   HDL 68 08/30/2012 0845   CHOLHDL 2.3 01/18/2014 0803   VLDL 14 01/18/2014 0803   LDLCALC 67 01/18/2014 0803   LDLCALC 55 08/30/2012 0845    ECHO:09/06/2020 IMPRESSIONS   1. Severe AS with mean gradient 47 mmg; DI 0.26; mild AI.   2. Left ventricular ejection fraction, by estimation, is 60 to 65%. The  left ventricle has normal function. The left ventricle has no regional  wall motion abnormalities. There is mild left ventricular hypertrophy.  Left ventricular diastolic parameters  are consistent with Grade I diastolic dysfunction (impaired relaxation).  Elevated left atrial pressure.   3. Right ventricular systolic function is normal. The right ventricular  size is normal. There is normal pulmonary artery systolic pressure.   4. Left atrial size was moderately dilated.   5. Right atrial size was moderately dilated.   6. The mitral valve is normal in structure. Trivial mitral valve  regurgitation. No evidence of mitral stenosis. Moderate mitral annular  calcification.   7. The aortic valve is tricuspid. Aortic valve regurgitation is mild.  Severe aortic valve stenosis.   8. The inferior vena cava is normal in size with greater than 50%  respiratory variability, suggesting right atrial  pressure of 3 mmHg.   Comparison(s): No significant change from prior study.   FINDINGS   Left Ventricle: Left ventricular ejection fraction, by estimation, is 60  to 65%. The left ventricle has normal function. The left ventricle has no  regional wall motion abnormalities. The left ventricular internal cavity  size was normal in size. There is   mild left ventricular hypertrophy. Left ventricular diastolic parameters  are consistent with Grade I diastolic dysfunction (impaired relaxation).  Elevated left atrial pressure.   Right Ventricle: The right ventricular size is normal. Right ventricular  systolic function is normal. There is normal pulmonary artery systolic  pressure. The tricuspid regurgitant velocity is 2.67 m/s, and with an  assumed right  atrial pressure of 3 mmHg,   the estimated right ventricular systolic pressure is 96.2 mmHg.   Left Atrium: Left atrial size was moderately dilated.   Right Atrium: Right atrial size was moderately dilated.   Pericardium: There is no evidence of pericardial effusion.   Mitral Valve: The mitral valve is normal in structure. Moderate mitral  annular calcification. Trivial mitral valve regurgitation. No evidence of  mitral valve stenosis.   Tricuspid Valve: The tricuspid valve is normal in structure. Tricuspid  valve regurgitation is mild . No evidence of tricuspid stenosis.   Aortic Valve: The aortic valve is tricuspid. Aortic valve regurgitation is  mild. Aortic regurgitation PHT measures 533 msec. Severe aortic stenosis  is present. Aortic valve mean gradient measures 47.5 mmHg. Aortic valve  peak gradient measures 77.1 mmHg.  Aortic valve area, by VTI measures 0.98 cm.   Pulmonic Valve: The pulmonic valve was not well visualized. Pulmonic valve  regurgitation is not visualized. No evidence of pulmonic stenosis.   Aorta: The aortic root is normal in size and structure.   Venous: The inferior vena cava is normal in size with  greater than 50%  respiratory variability, suggesting right atrial pressure of 3 mmHg.   IAS/Shunts: No atrial level shunt detected by color flow Doppler.   Additional Comments: Severe AS with mean gradient 47 mmg; DI 0.26; mild  AI.      LEFT VENTRICLE  PLAX 2D  LVIDd:         4.70 cm  Diastology  LVIDs:         3.40 cm  LV e' medial:    4.14 cm/s  LV PW:         1.20 cm  LV E/e' medial:  24.0  LV IVS:        1.30 cm  LV e' lateral:   6.32 cm/s  LVOT diam:     2.20 cm  LV E/e' lateral: 15.7  LV SV:         114  LV SV Index:   61  LVOT Area:     3.80 cm                             3D Volume EF:                          3D EF:        66 %                          LV EDV:       119 ml                          LV ESV:       41 ml                          LV SV:        79 ml   RIGHT VENTRICLE  RV S prime:     10.30 cm/s  TAPSE (M-mode): 1.4 cm   LEFT ATRIUM             Index       RIGHT ATRIUM           Index  LA diam:  3.40 cm 1.82 cm/m  RA Area:     23.40 cm  LA Vol (A2C):   56.8 ml 30.41 ml/m RA Volume:   77.70 ml  41.61 ml/m  LA Vol (A4C):   51.9 ml 27.79 ml/m  LA Biplane Vol: 57.5 ml 30.79 ml/m   AORTIC VALVE  AV Area (Vmax):    1.03 cm  AV Area (Vmean):   0.94 cm  AV Area (VTI):     0.98 cm  AV Vmax:           439.00 cm/s  AV Vmean:          332.500 cm/s  AV VTI:            1.155 m  AV Peak Grad:      77.1 mmHg  AV Mean Grad:      47.5 mmHg  LVOT Vmax:         119.00 cm/s  LVOT Vmean:        82.000 cm/s  LVOT VTI:          0.299 m  LVOT/AV VTI ratio: 0.26  AI PHT:            533 msec     AORTA  Ao Root diam: 3.10 cm  Ao Asc diam:  3.70 cm   MITRAL VALVE                TRICUSPID VALVE  MV Area (PHT): 2.37 cm     TR Peak grad:   28.5 mmHg  MV Decel Time: 320 msec     TR Vmax:        267.00 cm/s  MV E velocity: 99.20 cm/s  MV A velocity: 122.00 cm/s  SHUNTS  MV E/A ratio:  0.81         Systemic VTI:  0.30 m                               Systemic Diam: 2.20 cm    IMPRESSION:  1. Nonrheumatic aortic valve stenosis   2. CAD in native artery   3. Hx of CABG: November 2009   4. Stage 3b chronic kidney disease ([place])   5. Hyperlipidemia with target LDL less than 70   6. Hypothyroidism, unspecified type   7. Carotid artery stenosis status post right carotid endarterectomy 03/26/2016     ASSESSMENT AND PLAN Dillon [name] is an 87 year old gentleman who underwent emergent CABG revascularization surgery for life-threatening coronary anatomy in November 2009 by Dr.Bartle.  He has a history of peripheral vascular disease and underwent successful right carotid endarterectomy by Dr. [name].  Most recent carotid studies from February 2022 were reviewed with prior studies and continue to show stable velocities in the 1 to 39% range bilaterally with normal vertebral and subclavian flow.  Dillon [name] continues to feel well and denies any chest pain palpitations or significant change in activity.  He continues to play golf where he walks up and down hills without dyspnea.  However if he does overextend himself as he has noticed some very minimal shortness of breath compared to previously.  He has a history of aortic valve stenosis and over the years his aortic valve gradient has progressed and his aortic valve stenosis is now in the serious range.  His echo Doppler study from April 2022 showed a mean 40 mm with a peak gradient of 70 and aortic valve area of approximately 1  cm.  During his May 29, 2020 I discussed the progression of his aortic stenosis with him in detail and discussed the potential need for aortic valve surgery.  At that time he insisted that he was asymptomatic and felt well.  I recommended a follow-up echo Doppler study be obtained and this was recently completed on September 06, 2020.  This shows slight additional progression with a mean gradient now at 47 and peak gradient at 77 with aortic valve area 0.98 cm.  He continues to play golf and  will be playing in a tournament in September.  He does not walk but uses a golf cart 2 days/week.  He also remains active doing yard work.  He has developed renal insufficiency and most recent creatinine has increased from 1.67 to1.71.  His blood pressure today when taken by me was 142/70 with a resting pulse at 51.  He has continued to be on amlodipine 5 mg, losartan 50 mg, metoprolol succinate 50 mg daily to his rosuvastatin 40 mg.  He is on levothyroxine for hypothyroidism and 112 mcg.  He has continued to be on aspirin and Plavix for DAPT.  I had a long discussion with him today particularly with reference to continued slight progression of his aortic stenosis.  He is now 87 years old.  Dr. Cyndia Bent had performed CABG revascularization surgery in 2009 and also performs TAVR.  I have recommended he see Dr. Cyndia Bent for evaluation and potential future candidacy for TAVR.  Ultimately he will need to undergo a right and left heart cardiac catheterization.  I would not schedule this presently pending his evaluation.  In the past I had reduced his losartan to 50 mg.  This may need to be further decreased if renal function worsens.  He also was instructed that if his resting heart rate is consistently less than 54 to reduce his metoprolol succinate to 25 mg.  If the decision is to continue watchful waiting in 6 months I have recommended a follow-up echo evaluation with office visit.  However pending Dr. Vivi Martens if the decision is to proceed with AVR sooner catheterization will need to be performed sooner.     Troy Sine, MD, [facility]  09/26/2020 2:43 PM

## 2020-09-26 ENCOUNTER — Encounter: Payer: Self-pay | Admitting: Cardiovascular Disease

## 2020-10-29 ENCOUNTER — Encounter: Payer: Self-pay | Admitting: Cardiovascular Disease

## 2020-10-29 ENCOUNTER — Ambulatory Visit: Payer: PPO | Admitting: Cardiovascular Disease

## 2020-10-29 ENCOUNTER — Other Ambulatory Visit: Payer: Self-pay

## 2020-10-29 VITALS — BP 170/70 | HR 56 | Ht 66.0 in | Wt 174.8 lb

## 2020-10-29 DIAGNOSIS — I350 Nonrheumatic aortic (valve) stenosis: Secondary | ICD-10-CM | POA: Diagnosis not present

## 2020-10-29 NOTE — Progress Notes (Signed)
[facility]   MULTIDISCIPLINARY HEART VALVE TEAM  Date:  10/29/2020   ID:  Dillon [name], DOB 07-Mar-1933, MRN [id number]  PCP:  Drosinis, Pamalee Leyden, PA-C   Chief Complaint  Patient presents with   Aortic Stenosis      HISTORY OF PRESENT ILLNESS: Dillon [name] is a 87 y.o. male who presents for evaluation of aortic stenosis, referred by Dr Claiborne Billings.  The patient's cardiac history dates back to 2009 when he was having exertional angina and had a high risk nuclear stress test.  He was admitted for cardiac catheterization which demonstrated critical left main and multivessel disease and the patient was taken emergently for coronary bypass surgery by Dr. Cyndia Bent.  He was treated with a LIMA to LAD, saphenous vein graft to OM, and saphenous vein graft to RCA.  The patient has done well since that time.  He has been followed for carotid stenosis, hypertension, and mixed hyperlipidemia.  The patient has been noted to have aortic stenosis and has been followed now for many years.  In 2020 and 2021 he had findings consistent with moderate aortic stenosis.  However, in spring 2022 the patient's echocardiogram demonstrated progressive and now severe aortic stenosis with a mean transvalvular gradient greater than 40 mmHg.  A follow-up echocardiogram in August 2022 showed stable findings with a mean gradient of 47 mmHg.  He is referred today for further discussion of treatment options.  The patient is here with his wife today.  He is a functionally independent 87 year old gentleman who maintains an active lifestyle.  He plays golf at least a few times per week.  He can do yard work and chores around his house without any symptoms.  He specifically denies symptoms of chest pain, chest pressure, shortness of breath, fatigue, orthopnea, PND, heart palpitations, lightheadedness, or syncope.  Past Medical History:  Diagnosis Date   CAD (coronary artery disease)    Heart murmur    History of hiatal hernia     Hypertension    Hypothyroidism    Pneumonia 12/2015   hx   S/P CABG x 3 12/17/07   LIMA to LAD,SVG to left C    Current Outpatient Medications  Medication Sig Dispense Refill   amLODipine (NORVASC) 5 MG tablet Take 1 tablet by mouth daily 90 tablet 3   aspirin EC 81 MG tablet Take 81 mg by mouth at bedtime.      Cholecalciferol (VITAMIN D) 2000 units tablet Take 2,000 Units by mouth at bedtime.     clopidogrel (PLAVIX) 75 MG tablet Take 1 tablet by mouth daily 90 tablet 1   Cyanocobalamin (B-12) 2000 MCG TABS      fexofenadine (ALLEGRA) 180 MG tablet Take 180 mg by mouth daily.     folic acid (FOLVITE) 1 MG tablet Take 1 tablet by mouth daily 90 tablet 1   levothyroxine (SYNTHROID, LEVOTHROID) 112 MCG tablet Take 112 mcg by mouth daily.     losartan (COZAAR) 50 MG tablet Take 1 tablet (50 mg total) by mouth daily. 90 tablet 3   metoprolol succinate (TOPROL-XL) 50 MG 24 hr tablet Take 1 tablet by mouth once daily with or immediately following a meal (please schedule an appointment for future refills) 90 tablet 3   mupirocin cream (BACTROBAN) 2 % Apply 1 application topically daily as needed (after skin treatments).      Omega-3 1400 MG CAPS Take by mouth daily.     rosuvastatin (CRESTOR) 40 MG tablet Take 1 tablet by  mouth every day 90 tablet 3   terbinafine (LAMISIL) 1 % cream Apply 1 application topically daily as needed (rash).      No current facility-administered medications for this visit.    ALLERGIES:   Patient has no known allergies.   SOCIAL HISTORY:  The patient  reports that he quit smoking about 49 years ago. His smoking use included cigarettes. He has never used smokeless tobacco. He reports current alcohol use. He reports that he does not use drugs.   FAMILY HISTORY:  The patient's family history includes Heart attack in his father.   REVIEW OF SYSTEMS:    All other systems are reviewed and negative except as outlined in the HPI  PHYSICAL EXAM: VS:  BP (!) 170/70    Pulse (!) 56   Ht 5\' 6"  (1.676 m)   Wt 174 lb 12.8 oz (79.3 kg)   SpO2 96%   BMI 28.21 kg/m  , BMI Body mass index is 28.21 kg/m. GEN: Well nourished, well developed, in no acute distress HEENT: normal Neck: No JVD. carotids 2+ with bilateral bruits Cardiac: The heart is RRR with with a harsh 3/6 systolic murmur at the right upper sternal border, diminished A2 no edema. Pedal pulses 2+ = bilaterally  Respiratory:  clear to auscultation bilaterally GI: soft, nontender, nondistended, + BS MS: no deformity or atrophy Skin: warm and dry, no rash Neuro:  Strength and sensation are intact Psych: euthymic mood, full affect  EKG:  EKG from 09/24/2020 reviewed and demonstrates sinus brady 51 bpm, no significant ST-T changes  RECENT LABS: No results found for requested labs within last 8760 hours.  No results found for requested labs within last 8760 hours.   CrCl cannot be calculated (Patient's most recent lab result is older than the maximum 21 days allowed.).   Wt Readings from Last 3 Encounters:  10/29/20 174 lb 12.8 oz (79.3 kg)  09/24/20 172 lb (78 kg)  05/29/20 170 lb 4 oz (77.2 kg)     CARDIAC STUDIES: Echo: FINDINGS   Left Ventricle: Left ventricular ejection fraction, by estimation, is 60  to 65%. The left ventricle has normal function. The left ventricle has no  regional wall motion abnormalities. The left ventricular internal cavity  size was normal in size. There is   mild left ventricular hypertrophy. Left ventricular diastolic parameters  are consistent with Grade I diastolic dysfunction (impaired relaxation).  Elevated left atrial pressure.   Right Ventricle: The right ventricular size is normal. Right ventricular  systolic function is normal. There is normal pulmonary artery systolic  pressure. The tricuspid regurgitant velocity is 2.67 m/s, and with an  assumed right atrial pressure of 3 mmHg,   the estimated right ventricular systolic pressure is 08.1 mmHg.    Left Atrium: Left atrial size was moderately dilated.   Right Atrium: Right atrial size was moderately dilated.   Pericardium: There is no evidence of pericardial effusion.   Mitral Valve: The mitral valve is normal in structure. Moderate mitral  annular calcification. Trivial mitral valve regurgitation. No evidence of  mitral valve stenosis.   Tricuspid Valve: The tricuspid valve is normal in structure. Tricuspid  valve regurgitation is mild . No evidence of tricuspid stenosis.   Aortic Valve: The aortic valve is tricuspid. Aortic valve regurgitation is  mild. Aortic regurgitation PHT measures 533 msec. Severe aortic stenosis  is present. Aortic valve mean gradient measures 47.5 mmHg. Aortic valve  peak gradient measures 77.1 mmHg.  Aortic valve area, by  VTI measures 0.98 cm.   Pulmonic Valve: The pulmonic valve was not well visualized. Pulmonic valve  regurgitation is not visualized. No evidence of pulmonic stenosis.   Aorta: The aortic root is normal in size and structure.   Venous: The inferior vena cava is normal in size with greater than 50%  respiratory variability, suggesting right atrial pressure of 3 mmHg.   IAS/Shunts: No atrial level shunt detected by color flow Doppler.   Additional Comments: Severe AS with mean gradient 47 mmg; DI 0.26; mild  AI.      LEFT VENTRICLE  PLAX 2D  LVIDd:         4.70 cm  Diastology  LVIDs:         3.40 cm  LV e' medial:    4.14 cm/s  LV PW:         1.20 cm  LV E/e' medial:  24.0  LV IVS:        1.30 cm  LV e' lateral:   6.32 cm/s  LVOT diam:     2.20 cm  LV E/e' lateral: 15.7  LV SV:         114  LV SV Index:   61  LVOT Area:     3.80 cm                             3D Volume EF:                          3D EF:        66 %                          LV EDV:       119 ml                          LV ESV:       41 ml                          LV SV:        79 ml   RIGHT VENTRICLE  RV S prime:     10.30 cm/s  TAPSE (M-mode):  1.4 cm   LEFT ATRIUM             Index       RIGHT ATRIUM           Index  LA diam:        3.40 cm 1.82 cm/m  RA Area:     23.40 cm  LA Vol (A2C):   56.8 ml 30.41 ml/m RA Volume:   77.70 ml  41.61 ml/m  LA Vol (A4C):   51.9 ml 27.79 ml/m  LA Biplane Vol: 57.5 ml 30.79 ml/m   AORTIC VALVE  AV Area (Vmax):    1.03 cm  AV Area (Vmean):   0.94 cm  AV Area (VTI):     0.98 cm  AV Vmax:           439.00 cm/s  AV Vmean:          332.500 cm/s  AV VTI:            1.155 m  AV Peak Grad:      77.1 mmHg  AV Mean Grad:  47.5 mmHg  LVOT Vmax:         119.00 cm/s  LVOT Vmean:        82.000 cm/s  LVOT VTI:          0.299 m  LVOT/AV VTI ratio: 0.26  AI PHT:            533 msec     AORTA  Ao Root diam: 3.10 cm  Ao Asc diam:  3.70 cm   MITRAL VALVE                TRICUSPID VALVE  MV Area (PHT): 2.37 cm     TR Peak grad:   28.5 mmHg  MV Decel Time: 320 msec     TR Vmax:        267.00 cm/s  MV E velocity: 99.20 cm/s  MV A velocity: 122.00 cm/s  SHUNTS  MV E/A ratio:  0.81         Systemic VTI:  0.30 m                              Systemic Diam: 2.20 cm     ASSESSMENT AND PLAN: Severe, stage C, aortic stenosis.  The patient specifically denies symptoms of chest pain, shortness of breath, fatigue, or lightheadedness.  He maintains a physically active lifestyle, playing golf twice per week. I have reviewed the natural history of aortic stenosis with the patient and his wife who is present today. We have discussed the limitations of medical therapy and the poor prognosis associated with symptomatic aortic stenosis. We have reviewed potential treatment options, including palliative medical therapy, conventional surgical aortic valve replacement, and transcatheter aortic valve replacement. We discussed treatment options in the context of the patient's specific comorbid medical conditions.  The patient's significant comorbidities include stage III chronic kidney disease, carotid disease status  post endarterectomy, and CAD status post CABG.  When he requires treatment, I suspect he would be an ideal candidate for TAVR in the setting of his comorbid conditions.  He would be at high risk of conventional redo aortic valve replacement at his age of 34.  The patient understands that there is a treatment window where his risk might be lower to undergo TAVR.  He understands that his aortic stenosis would be predicted to progress over the next 1 to 2 years and I suspect there is a high likelihood that he will develop symptoms over that time period.  In review of his previous echo studies, his mean transaortic gradient was in the range of 24 to 25 mmHg in 2020 and 2021.  He had a big increase in his mean gradient from March 2021 to April 2022 when his mean gradient increased of 47 mmHg.  A follow-up echocardiogram in August of this year showed stability of his mean gradient at 47 to 48 mmHg.  On my review of his echo study, his LV function remains preserved, the aortic valve is tricuspid and is severely calcified and restricted.  The dimensionless index is 0.27.  Mean gradient as described above.  There is mild AI noted.  The patient is counseled extensively to watch for symptoms of progressive fatigue, dyspnea, chest discomfort, lightheadedness, or syncope.  He has a follow-up echocardiogram scheduled in February of next year and he sees Dr. Claiborne Billings back in March.  We will keep an eye on his echo studies and I will see him back in the future  when needed.  Deatra Maxi 10/29/2020 12:00 PM     [address] [place] [address]  [phone] (office) [phone] (fax)

## 2020-10-29 NOTE — Patient Instructions (Signed)
Medication Instructions:  *If you need a refill on your cardiac medications before your next appointment, please call your pharmacy*   Lab Work: If you have labs (blood work) drawn today and your tests are completely normal, you will receive your results only by: [place] (if you have MyChart) OR A paper copy in the mail If you have any lab test that is abnormal or we need to change your treatment, we will call you to review the results.   Testing/Procedures:  Follow-Up: At [facility], you and your health needs are our priority.  As part of our continuing mission to provide you with exceptional heart care, we have created designated Provider Care Teams.  These Care Teams include your primary Cardiologist (physician) and Advanced Practice Providers (APPs -  Physician Assistants and Nurse Practitioners) who all work together to provide you with the care you need, when you need it.  We recommend signing up for the patient portal called "MyChart".  Sign up information is provided on this After Visit Summary.  MyChart is used to connect with patients for Virtual Visits (Telemedicine).  Patients are able to view lab/test results, encounter notes, upcoming appointments, etc.  Non-urgent messages can be sent to your provider as well.   To learn more about what you can do with MyChart, go to [email or web address].

## 2020-10-31 ENCOUNTER — Other Ambulatory Visit: Payer: Self-pay

## 2020-10-31 MED ORDER — FOLIC ACID 1 MG PO TABS: 1.0000 mg | ORAL_TABLET | Freq: Every day | ORAL | 3 refills | Status: DC

## 2020-10-31 MED ORDER — METOPROLOL SUCCINATE ER 50 MG PO TB24: 50.0000 mg | ORAL_TABLET | Freq: Every day | ORAL | 3 refills | Status: DC

## 2020-11-26 DIAGNOSIS — I129 Hypertensive chronic kidney disease with stage 1 through stage 4 chronic kidney disease, or unspecified chronic kidney disease: Secondary | ICD-10-CM | POA: Diagnosis not present

## 2020-11-26 DIAGNOSIS — E538 Deficiency of other specified B group vitamins: Secondary | ICD-10-CM | POA: Diagnosis not present

## 2020-11-26 DIAGNOSIS — N1832 Chronic kidney disease, stage 3b: Secondary | ICD-10-CM | POA: Diagnosis not present

## 2020-12-12 ENCOUNTER — Other Ambulatory Visit: Payer: Self-pay | Admitting: Cardiovascular Disease

## 2020-12-15 ENCOUNTER — Other Ambulatory Visit: Payer: Self-pay | Admitting: Cardiovascular Disease

## 2020-12-17 MED ORDER — LOSARTAN POTASSIUM 50 MG PO TABS: 50.0000 mg | ORAL_TABLET | Freq: Every day | ORAL | 3 refills | Status: DC

## 2020-12-17 NOTE — Telephone Encounter (Signed)
Sent information to patient - refill completed

## 2021-01-02 DIAGNOSIS — M1611 Unilateral primary osteoarthritis, right hip: Secondary | ICD-10-CM | POA: Diagnosis not present

## 2021-01-02 DIAGNOSIS — M1610 Unilateral primary osteoarthritis, unspecified hip: Secondary | ICD-10-CM | POA: Diagnosis not present

## 2021-01-11 ENCOUNTER — Other Ambulatory Visit: Payer: Self-pay | Admitting: Cardiovascular Disease

## 2021-01-23 DIAGNOSIS — I350 Nonrheumatic aortic (valve) stenosis: Secondary | ICD-10-CM | POA: Insufficient documentation

## 2021-01-23 DIAGNOSIS — N1832 Chronic kidney disease, stage 3b: Secondary | ICD-10-CM | POA: Insufficient documentation

## 2021-01-23 DIAGNOSIS — R809 Proteinuria, unspecified: Secondary | ICD-10-CM | POA: Insufficient documentation

## 2021-02-21 ENCOUNTER — Other Ambulatory Visit: Payer: Self-pay | Admitting: Cardiovascular Disease

## 2021-03-14 ENCOUNTER — Other Ambulatory Visit (HOSPITAL_COMMUNITY): Payer: PPO

## 2021-03-19 ENCOUNTER — Ambulatory Visit (HOSPITAL_COMMUNITY): Payer: PPO | Attending: Cardiovascular Disease

## 2021-03-19 ENCOUNTER — Other Ambulatory Visit: Payer: Self-pay

## 2021-03-19 DIAGNOSIS — I350 Nonrheumatic aortic (valve) stenosis: Secondary | ICD-10-CM | POA: Diagnosis not present

## 2021-03-19 LAB — ECHOCARDIOGRAM COMPLETE
AR max vel: 0.75 cm2
AV Area VTI: 0.8 cm2
AV Area mean vel: 0.71 cm2
AV Mean grad: 53 mmHg
AV Peak grad: 83.2 mmHg
Ao pk vel: 4.56 m/s
Area-P 1/2: 2 cm2
P 1/2 time: 504 msec
S' Lateral: 2.6 cm

## 2021-03-22 ENCOUNTER — Other Ambulatory Visit: Payer: Self-pay

## 2021-03-22 DIAGNOSIS — I6523 Occlusion and stenosis of bilateral carotid arteries: Secondary | ICD-10-CM

## 2021-03-25 ENCOUNTER — Ambulatory Visit (INDEPENDENT_AMBULATORY_CARE_PROVIDER_SITE_OTHER): Payer: PPO | Admitting: Physician Assistant

## 2021-03-25 ENCOUNTER — Ambulatory Visit (HOSPITAL_COMMUNITY)
Admission: RE | Admit: 2021-03-25 | Discharge: 2021-03-25 | Disposition: A | Discharge status: Home / Self Care | Payer: PPO | Source: Ambulatory Visit | Attending: Surgery | Admitting: Surgery

## 2021-03-25 ENCOUNTER — Other Ambulatory Visit: Payer: Self-pay

## 2021-03-25 VITALS — BP 153/72 | HR 52 | Temp 97.9°F | Resp 20 | Ht 66.0 in | Wt 176.0 lb

## 2021-03-25 DIAGNOSIS — I6523 Occlusion and stenosis of bilateral carotid arteries: Secondary | ICD-10-CM

## 2021-03-25 NOTE — Progress Notes (Signed)
Office Note     CC:  follow up Requesting Provider:  Drosinis, Pamalee Leyden, PA-C  HPI: Dillon [name] is a 88 y.o. ([birth year]) male who presents for routine follow up of carotid artery disease. He is s/p right CEA by Dr. [name] on 03/26/16. This was for asymptomatic high grade stenosis. He has no history of TIA or stroke.  His left ICA has been in the 1-39% stenosis range on duplex evaluation.   Today he denies any amaurosis fugax or other visual changes, slurred speech, facial drooping, unilateral upper or lower extremity weakness or numbness. He does not have any lower extremity claudication, rest pain or tissue loss. He is compliant with his Aspirin and Plavix. He reports that he remains very active. Still plays golf often.  He does report that he was seen by his Cardiologist, Dr. Claiborne Billings, who has recommended TAVR and has referred him back to Dr. Cyndia Bent for further evaluation of this. He has follow up in a couple weeks with Dr. Claiborne Billings for further workup.   The pt is on a statin for cholesterol management.  The pt is on a daily aspirin.   Other AC:  Plavix The pt is on ARB, BB, CCB for hypertension.   The pt is not diabetic.   Tobacco hx:  Former  Past Medical History:  Diagnosis Date   CAD (coronary artery disease)    Heart murmur    History of hiatal hernia    Hypertension    Hypothyroidism    Pneumonia 12/2015   hx   S/P CABG x 3 12/17/07   LIMA to LAD,SVG to left C    Past Surgical History:  Procedure Laterality Date   CORONARY ARTERY BYPASS GRAFT  12/17/07   LIMA to LAD,vein to obtuse marginal,vein to RCA   EMPYEMA DRAINAGE Left 01/11/2016   Procedure: EMPYEMA DRAINAGE;  Surgeon: Ivin Poot, MD;  Location: [facility] OR;  Service: Thoracic;  Laterality: Left;   ENDARTERECTOMY Right 03/26/2016   Procedure: RIGHT CAROTID ENDARTERECTOMY;  Surgeon: Serafina Mitchell, MD;  Location: [place];  Service: Vascular;  Laterality: Right;   PATCH ANGIOPLASTY Right 03/26/2016   Procedure: [place];  Surgeon: Serafina Mitchell, MD;  Location: [facility] OR;  Service: Vascular;  Laterality: Right;   VIDEO ASSISTED THORACOSCOPY (VATS)/DECORTICATION Left 01/11/2016   Procedure: VIDEO ASSISTED THORACOSCOPY (VATS)/DECORTICATION;  Surgeon: Ivin Poot, MD;  Location: [place];  Service: Thoracic;  Laterality: Left;    Social History   Socioeconomic History   Marital status: Married    Spouse name: Not on file   Number of children: Not on file   Years of education: Not on file   Highest education level: Not on file  Occupational History   Not on file  Tobacco Use   Smoking status: Former    Types: Cigarettes    Quit date: 01/28/1971    Years since quitting: 50.1    Passive exposure: Never   Smokeless tobacco: Never  Vaping Use   Vaping Use: Never used  Substance and Sexual Activity   Alcohol use: Yes    Comment: socially   Drug use: No   Sexual activity: Not on file  Other Topics Concern   Not on file  Social History Narrative   Not on file   Social Determinants of Health   Financial Resource Strain: Not on file  Food Insecurity: Not on file  Transportation Needs: Not on file  Physical Activity: Not on  file  Stress: Not on file  Social Connections: Not on file  Intimate Partner Violence: Not on file    Family History  Problem Relation Age of Onset   Heart attack Father     Current Outpatient Medications  Medication Sig Dispense Refill   amLODipine (NORVASC) 5 MG tablet Take 1 tablet by mouth daily 90 tablet 3   aspirin EC 81 MG tablet Take 81 mg by mouth at bedtime.      Cholecalciferol (VITAMIN D) 2000 units tablet Take 2,000 Units by mouth at bedtime.     clopidogrel (PLAVIX) 75 MG tablet Take 1 tablet by mouth once daily 90 tablet 0   Cyanocobalamin (B-12) 2000 MCG TABS      fexofenadine (ALLEGRA) 180 MG tablet Take 180 mg by mouth daily.     folic acid (FOLVITE) 1 MG tablet Take 1 tablet (1 mg total) by mouth daily. 90 tablet  3   levothyroxine (SYNTHROID, LEVOTHROID) 112 MCG tablet Take 112 mcg by mouth daily.     metoprolol succinate (TOPROL-XL) 50 MG 24 hr tablet Take 1 tablet (50 mg total) by mouth daily. Take with or immediately following a meal. 90 tablet 3   mupirocin cream (BACTROBAN) 2 % Apply 1 application topically daily as needed (after skin treatments).      Omega-3 1400 MG CAPS Take by mouth daily.     rosuvastatin (CRESTOR) 40 MG tablet Take 1 tablet by mouth every day 90 tablet 2   terbinafine (LAMISIL) 1 % cream Apply 1 application topically daily as needed (rash).      losartan (COZAAR) 50 MG tablet Take 1 tablet (50 mg total) by mouth daily. 90 tablet 3   No current facility-administered medications for this visit.    No Known Allergies   REVIEW OF SYSTEMS:   [X]  denotes positive finding, [ ]  denotes negative finding Cardiac  Comments:  Chest pain or chest pressure:    Shortness of breath upon exertion:    Short of breath when lying flat:    Irregular heart rhythm:        Vascular    Pain in calf, thigh, or hip brought on by ambulation:    Pain in feet at night that wakes you up from your sleep:     Blood clot in your veins:    Leg swelling:         Pulmonary    Oxygen at home:    Productive cough:     Wheezing:         Neurologic    Sudden weakness in arms or legs:     Sudden numbness in arms or legs:     Sudden onset of difficulty speaking or slurred speech:    Temporary loss of vision in one eye:     Problems with dizziness:         Gastrointestinal    Blood in stool:     Vomited blood:         Genitourinary    Burning when urinating:     Blood in urine:        Psychiatric    Major depression:         Hematologic    Bleeding problems:    Problems with blood clotting too easily:        Skin    Rashes or ulcers:        Constitutional    Fever or chills:      PHYSICAL EXAMINATION:  Vitals:   03/25/21 1038 03/25/21 1042  BP: (!) 152/60 (!) 153/72   Pulse: (!) 52   Resp: 20   Temp: 97.9 F (36.6 C)   TempSrc: Temporal   SpO2: 97%   Weight: 176 lb (79.8 kg)   Height: 5\' 6"  (1.676 m)     General:  WDWN in NAD; vital signs documented above Gait: Normal HENT: WNL, normocephalic Pulmonary: normal non-labored breathing , without wheezing Cardiac: regular HR, with Murmurs without carotid bruit  Abdomen: soft, NT, no masses Vascular Exam/Pulses:2+ radial pulses bilaterally, 2+ pedal pulses bilaterally Extremities: without ischemic changes, without Gangrene , without cellulitis; without open wounds;  Musculoskeletal: no muscle wasting or atrophy  Neurologic: A&O X 3;  No focal weakness or paresthesias are detected Psychiatric:  The pt has Normal affect.   Non-Invasive Vascular Imaging:   VAS Carotid Duplex: 03/25/21 Summary:  Right Carotid: Velocities in the right ICA are consistent with a 1-39% stenosis.   Left Carotid: Velocities in the left ICA are consistent with a 40-59% stenosis. Velocities may be overestimated due to vessel angle.   Vertebrals:  Bilateral vertebral arteries demonstrate antegrade flow.  Subclavians: Normal flow hemodynamics were seen in bilateral subclavian arteries   ASSESSMENT/PLAN:: 88 y.o. male here for follow up for carotid artery disease. He is s/p right CEA by Dr. [name] on 03/26/16. This was for asymptomatic high grade stenosis. He presently is without any associated symptoms. His duplex today is essentially unchanged. There is a slight increase on the left ICA so he is now in the 40-59% stenosis range. His right remains 1-39%. He has normal flow in his vertebral and subclavian arteries bilaterally.  - Continue Aspirin, Statin, Plavix - He will follow up in 1 year with repeat carotid duplex   Karoline Caldwell, PA-C [facility] [phone]  Clinic MD:   Dr. [name]

## 2021-04-08 ENCOUNTER — Ambulatory Visit: Payer: PPO | Admitting: Cardiovascular Disease

## 2021-04-08 ENCOUNTER — Other Ambulatory Visit: Payer: Self-pay

## 2021-04-08 ENCOUNTER — Encounter: Payer: Self-pay | Admitting: Cardiovascular Disease

## 2021-04-08 VITALS — BP 160/72 | HR 47 | Ht 66.0 in | Wt 174.8 lb

## 2021-04-08 DIAGNOSIS — I2510 Atherosclerotic heart disease of native coronary artery without angina pectoris: Secondary | ICD-10-CM | POA: Diagnosis not present

## 2021-04-08 DIAGNOSIS — Z951 Presence of aortocoronary bypass graft: Secondary | ICD-10-CM

## 2021-04-08 DIAGNOSIS — E785 Hyperlipidemia, unspecified: Secondary | ICD-10-CM

## 2021-04-08 DIAGNOSIS — I10 Essential (primary) hypertension: Secondary | ICD-10-CM

## 2021-04-08 DIAGNOSIS — I6523 Occlusion and stenosis of bilateral carotid arteries: Secondary | ICD-10-CM

## 2021-04-08 DIAGNOSIS — E039 Hypothyroidism, unspecified: Secondary | ICD-10-CM

## 2021-04-08 DIAGNOSIS — I350 Nonrheumatic aortic (valve) stenosis: Secondary | ICD-10-CM | POA: Diagnosis not present

## 2021-04-08 DIAGNOSIS — N1832 Chronic kidney disease, stage 3b: Secondary | ICD-10-CM

## 2021-04-08 NOTE — Patient Instructions (Signed)
Medication Instructions:  ?Your physician recommends that you continue on your current medications as directed. Please refer to the Current Medication list given to you today. ? ?*If you need a refill on your cardiac medications before your next appointment, please call your pharmacy* ? ? ?Testing/Procedures: ?Your physician has requested that you have an echocardiogram. Echocardiography is a painless test that uses sound waves to create images of your heart. It provides your doctor with information about the size and shape of your heart and how well your heart?s chambers and valves are working. This procedure takes approximately one hour. There are no restrictions for this procedure. To be done in Aug. This procedure is done at [address] ? ? ? ?Follow-Up: ?At [facility], you and your health needs are our priority.  As part of our continuing mission to provide you with exceptional heart care, we have created designated Provider Care Teams.  These Care Teams include your primary Cardiologist (physician) and Advanced Practice Providers (APPs -  Physician Assistants and Nurse Practitioners) who all work together to provide you with the care you need, when you need it. ? ?We recommend signing up for the patient portal called "MyChart".  Sign up information is provided on this After Visit Summary.  MyChart is used to connect with patients for Virtual Visits (Telemedicine).  Patients are able to view lab/test results, encounter notes, upcoming appointments, etc.  Non-urgent messages can be sent to your provider as well.   ?To learn more about what you can do with MyChart, go to [email or web address].   ? ?Your next appointment:   ?5 month(s) ? ?The format for your next appointment:   ?In Person ? ?Provider:   ?Shelva Majestic, MD  ?

## 2021-04-08 NOTE — Progress Notes (Unsigned)
Patient ID: Dillon [name], male   DOB: 02/17/1933, 88 y.o.   MRN: [id number]     HPI: Dillon [name], is a 88 y.o. male who presents to the office today for a 6 month follow-up cardiology evaluation.  In November 2009 Dillon [name] underwent emergent CABG revascularization surgery after cardiac catheterization revealed severe life-threatening anatomy with 95% ostial left main stenosis a 99% ostial RCA stenosis. Surgery was done by Dr. Cyndia Bent and he had a LIMA to the LAD, vein to the obtuse marginal, vein to the RCA. His last nuclear perfusion study in April 2012 continued to show normal perfusion.  [name] has documented carotid disease. A carotid Doppler study  in November 2013 which showed at least 60% stenosis in his carotid arteries bilaterally which was slightly increased from previously. A f/u carotid evaluation last year demonstrated his peak right internal carotid systolic velocity 825 slightly increased from 240 in his left PICA systolic velocity up to 11 slightly increased from 200; 50-69% diameter reduction range bilaterally and was not significantly changed from one year ago.  On 12/01/2013 a follow-up study demonstrated a peak PICA velocity was now 003 with diastolic velocity at 42 and the right carotid and 218 and 61 in the left carotid.  He remains asymptomatic and these place him in the upper end of scale in the 50-69% range  Additional problems include mixed hyperlipidemia and hypertension.  He has been on Toprol-XL 50 mg and losartan 100 mg in addition to amlodipine 5 mg for blood pressure control.. In the past he had derived marked benefit with Niaspan  as well as [name]. Follow-up laboratory on his current dose of [name] and niacin 1000 mg  revealed a total cholesterol 145, triglycerides 70, HDL 64, and LDL 67.  His glucose was 111.  TSH 4.5.  He had normal renal function with a BUN of 19 and creatinine of 1.1.  He underwent echo Doppler study on 06/06/2014.  This showed an  ejection fraction at 55-60%.  There was a small systolic gradient across his aortic valve with moderately calcified leaflets.  Valve area was 1.6 cm.  He had a mean gradient of 11 and a peak gradient of 23 mm suggestive of mild aortic stenosis.  PA pressure was 31 mm.  A nuclear perfusion study which remained normal with an ejection fraction of 59% and evidence for normal perfusion.  He underwent a F/U 2-D echo Doppler study on 08/20/2015 which showed an EF of 60-65%.  The aortic valve was calcified and thickened with mildly restricted motion.  Peak and mean gradients were 25 and 14 mm consistent with mild aortic stenosis with a valve area of 1.42 cm.  I scheduled him for follow-up carotid duplex exam which was done in July 2017.  This suggested progression of his right internal carotid stenoses with velocity now at 465/131, which places him in the greater than 80% range.  He had stable left internal carotid velocities now or in the 60-79% range.  He had normal subclavian arteries bilaterally, and patent vertebral arteries with antegrade flow.  I referred him to Dr. [name] who felt that with his asymptomatic status and low risk assessment that he should undergo carotid endarterectomy.  He was referred to Dr. [name] for an office evaluation but this has not yet been scheduled.  He was hospitalized in December 2017 with community-acquired pneumonia of the left lower lung and possible empyema.  He underwent thoracentesis as well as a VATS procedure  with decortication and left empyema drainage.  His carotid surgery with Dr. [name] was ultimately postponed but was successfully done on 03/26/2016 with right carotid endarterectomy.  Intraoperative findings included a 95% stenosis.  He was discharged the following day.    I saw him in October 2018 at which time he was doing well without chest pain, PND, orthopnea. He denies paresthesias, presyncope or syncope.  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   He had his fifth hole in one of his career on a par 3.  He recently had blood work done in [place] by his primary physician.  Renal function was stable, although potassium was upper normal at 5.2.  Lipid studies revealed cholesterol 148, triglycerides 134, HDL 52, LDL 69.  Thyroid function studies were normal.  Hemoglobin A1c was elevated at 6.4 and his estimated average glucose was 137.  He underwent a follow-up echo Doppler study in 11/04/2016.  This continued to show hyperdynamic LV function with an EF of 65-70% with moderate LVH.  Wall motion was normal.  His aortic valve gradient have slightly increased over the year, such that his peak gradient increased from 25 to 38 mm, and his mean gradient from 14 mm to 20 mmHg. Aortic valve area was 1.57 cm placing him still in the mild to mild to moderate AS category.    He had follow-up carotid imaging with Dr. [name] which showed a patent carotid endarterectomy site with no evidence for restenosis or hyperplasia.  Left internal carotid artery velocities suggested a 40 to 59% stenosis.  When I last saw him in September 2019 he denied any chest pain, presyncope or syncope or any symptoms of heart failure.  I reviewed recent laboratory done by his primary physician on March 04, 2017.  Chemistry was stable.  Total cholesterol was 125, triglycerides 73, HDL 49, and LDL 61.  TSH is 1.77.  Free T3 was 3.07 and free T4 1.0.  He was not anemic.  HbA1c 6.2  Dillon [name] underwent an evaluation by Jory Sims, NP in December 2019 after developing some mild irregularity to his heart rate was noted by his primary physician.  He was felt to have occasional to frequent PACs and had bigeminal PACs.  This ultimately resolved on its own.  On April 01, 2018 he underwent a follow-up echo Doppler study which continued to show normal LV function with an EF of 55 to 60%.  There was mitral annular calcification.  His aortic stenosis was in the moderate range with a mean gradient  now at 25 and a peak instantantaneous gradient at 41 with a valve area of 1.3 cm.  When that evaluation he remained asymptomatic and specifically denied chest pain, PND, orthopnea, presyncope or syncope.   I last saw him in September 2020 at which time he remained asymptomatic.  His wife had recently under gone knee surgery in August and they have been staying local instead of going down to [place].  He continues to deny any episodes of chest pain, change in exercise tolerance, palpitations, presyncope or syncope, or CHF symptomatology.  He is unaware of any palpitations.    Mr. Weissberg underwent a 1 year follow-up echo Doppler study on April 18, 2019.  LV function remains normal with EF 60 to 65%.  There was grade 2 diastolic dysfunction and mild LVH.  He had moderately elevated pulmonary artery systolic pressure at 44 mm.  There was moderate aortic stenosis with a mean gradient of 24 and  peak instantaneous gradient at 39 mmHg.  Aortic valve area was 1.04 cm.  There was mild aortic insufficiency.  When I saw him in April 2021 he remained asymptomatic and denied any chest pain or shortness of breath.  He denied any exertional dyspnea.   He had laboratory checked in February 2021 which showed a total cholesterol 138 HDL 57 LDL 62 and triglycerides 94.  During that evaluation I reviewed his echo Doppler with him in detail and his gradients had not significantly changed from prior evaluation.  I last saw him in November 2021.  Since his prior evaluation he continued to do well. 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  He played golf 6 times without chest pain or shortness of breath.  He states his heart rate typically runs in the 50s.  He denies any chest pain, dizziness or shortness of breath.  During that evaluation his blood pressure was mildly elevated and he was on amlodipine 5 mg, losartan 100 mg in addition to metoprolol succinate 50 mg.  He was mildly bradycardic  without symptoms.  In April 2022 I recommended follow-up echo Doppler study to reassess his aortic valve stenosis.  He was continuing DAPT therapy with aspirin/Plavix and was on levothyroxine for hypothyroidism.  He underwent follow-up carotid duplex imaging and was seen at [facility] by Clinton Gallant, PA-C.  He is status post right carotid endarterectomy in February 2015.  Carotid studies were reviewed which showed mild 1 to 39% stenoses bilaterally.  He had normal vertebral and subclavian flow.  He underwent an echo Doppler study on May 10, 2020.  This continued to show normal EF of 55 to 60%.  There was mild LVH.  There were no wall motion abnormalities.  He had severe mitral annular calcification without mitral stenosis.  His mean aortic valve gradient had increased to 40.4 mmHg with a peak gradient at 70.4.  Aortic valve area was 1.02 cm.  There was mild dilation of his ascending aorta at 36 mm..  I last saw him May 2022.  At that time he remained completely asymptomatic. He was continuing to play golf regularly and denies any change in exercise capacity while playing golf.  However, if he attempts to walk very fast he does note some very mild shortness of breath.  He is unaware of palpitations.  His blood pressure has been stable.  He had undergone follow-up lipid studies on April 26, 2020 which showed an LDL at 59 total cholesterol 131 and triglycerides at 88 with HDL at 54.  His most recent creatinine had increased to 1.67.  During that evaluation I had an extensive discussion with him concerning his April 2022 echo Doppler findings regarding his aortic stenosis and I spent considerable time reviewing the natural history of aortic stenosis and potential progression.  Since I last saw him, he continues to be symptom-free.  I had recommended he undergo a follow-up echo Doppler study which was done on September 06, 2020.  Ejection fraction continues to be excellent at 60 to 65%.  There is mild LVH and mild grade  1 diastolic dysfunction with elevated left atrial pressure.  His mean aortic gradient has increased from 40 to 47 mm and his peak instantaneous gradient from 70 to 77 mm.  Estimated aortic valve area is 0.98 cm2.  He presents for follow-up evaluation.   Past Medical History:  Diagnosis Date   CAD (coronary artery disease)    Heart murmur  History of hiatal hernia    Hypertension    Hypothyroidism    Pneumonia 12/2015   hx   S/P CABG x 3 12/17/07   LIMA to LAD,SVG to left C    Past Surgical History:  Procedure Laterality Date   CORONARY ARTERY BYPASS GRAFT  12/17/07   LIMA to LAD,vein to obtuse marginal,vein to RCA   EMPYEMA DRAINAGE Left 01/11/2016   Procedure: EMPYEMA DRAINAGE;  Surgeon: Ivin Poot, MD;  Location: [facility];  Service: Thoracic;  Laterality: Left;   ENDARTERECTOMY Right 03/26/2016   Procedure: RIGHT CAROTID ENDARTERECTOMY;  Surgeon: Serafina Mitchell, MD;  Location: [facility];  Service: Vascular;  Laterality: Right;   PATCH ANGIOPLASTY Right 03/26/2016   Procedure: PATCH ANGIOPLASTY USING Rueben Bash BIOLOGIC PATCH;  Surgeon: Serafina Mitchell, MD;  Location: [facility];  Service: Vascular;  Laterality: Right;   VIDEO ASSISTED THORACOSCOPY (VATS)/DECORTICATION Left 01/11/2016   Procedure: VIDEO ASSISTED THORACOSCOPY (VATS)/DECORTICATION;  Surgeon: Ivin Poot, MD;  Location: [facility];  Service: Thoracic;  Laterality: Left;    No Known Allergies  Current Outpatient Medications  Medication Sig Dispense Refill   amLODipine (NORVASC) 5 MG tablet Take 1 tablet by mouth daily 90 tablet 3   aspirin EC 81 MG tablet Take 81 mg by mouth at bedtime.      Cholecalciferol (VITAMIN D) 2000 units tablet Take 2,000 Units by mouth at bedtime.     clopidogrel (PLAVIX) 75 MG tablet Take 1 tablet by mouth once daily 90 tablet 0   Cyanocobalamin (B-12) 2000 MCG TABS      fexofenadine (ALLEGRA) 180 MG tablet Take 180 mg by mouth daily.     folic acid (FOLVITE) 1 MG tablet Take 1 tablet (1 mg  total) by mouth daily. 90 tablet 3   levothyroxine (SYNTHROID, LEVOTHROID) 112 MCG tablet Take 112 mcg by mouth daily.     losartan (COZAAR) 50 MG tablet Take 1 tablet (50 mg total) by mouth daily. 90 tablet 3   metoprolol succinate (TOPROL-XL) 50 MG 24 hr tablet Take 1 tablet (50 mg total) by mouth daily. Take with or immediately following a meal. 90 tablet 3   mupirocin cream (BACTROBAN) 2 % Apply 1 application topically daily as needed (after skin treatments).      Omega-3 1400 MG CAPS Take by mouth daily.     rosuvastatin (CRESTOR) 40 MG tablet Take 1 tablet by mouth every day 90 tablet 2   terbinafine (LAMISIL) 1 % cream Apply 1 application topically daily as needed (rash).      No current facility-administered medications for this visit.    Socially he is married. There are no children. He does remain active. He does walk. There is no tobacco use. He does drink occasional alcohol.  ROS General: Negative; No fevers, chills, or night sweats;  HEENT: Negative; No changes in vision or hearing, sinus congestion, difficulty swallowing Pulmonary: Negative; No cough, wheezing, shortness of breath, hemoptysis Cardiovascular:  See HPI GI: Negative; No nausea, vomiting, diarrhea, or abdominal pain GU: Negative; No dysuria, hematuria, or difficulty voiding Musculoskeletal: Negative; no myalgias, joint pain, or weakness Hematologic/Oncology: Negative; no easy bruising, bleeding Endocrine: Positive for hypothyroidism on Synthroid replacement. Neuro: Negative; no changes in balance, headaches Skin: Negative; No rashes or skin lesions Psychiatric: Negative; No behavioral problems, depression Sleep: Negative; No snoring, daytime sleepiness, hypersomnolence, bruxism, restless legs, hypnogognic hallucinations, no cataplexy Other comprehensive 14 point system review is negative.   PE BP (!) 160/72  Pulse (!) 47    Ht $R'5\' 6"'SH$  (1.676 m)    Wt 174 lb 12.8 oz (79.3 kg)    SpO2 92%    BMI 28.21 kg/m     Repeat blood pressure by me was 130/70  Wt Readings from Last 3 Encounters:  04/08/21 174 lb 12.8 oz (79.3 kg)  03/25/21 176 lb (79.8 kg)  10/29/20 174 lb 12.8 oz (79.3 kg)     Physical Exam BP (!) 160/72    Pulse (!) 47    Ht $R'5\' 6"'dj$  (1.676 m)    Wt 174 lb 12.8 oz (79.3 kg)    SpO2 92%    BMI 28.21 kg/m  General: Alert, oriented, no distress.  Skin: normal turgor, no rashes, warm and dry HEENT: Normocephalic, atraumatic. Pupils equal round and reactive to light; sclera anicteric; extraocular muscles intact; Fundi ** Nose without nasal septal hypertrophy Mouth/Parynx benign; Mallinpatti scale Neck: No JVD, no carotid bruits; normal carotid upstroke Lungs: clear to ausculatation and percussion; no wheezing or rales Chest wall: without tenderness to palpitation Heart: PMI not displaced, RRR, s1 s2 normal, 1/6 systolic murmur, no diastolic murmur, no rubs, gallops, thrills, or heaves Abdomen: soft, nontender; no hepatosplenomehaly, BS+; abdominal aorta nontender and not dilated by palpation. Back: no CVA tenderness Pulses 2+ Musculoskeletal: full range of motion, normal strength, no joint deformities Extremities: no clubbing cyanosis or edema, Homan's sign negative  Neurologic: grossly nonfocal; Cranial nerves grossly wnl Psychologic: Normal mood and affect    General: Alert, oriented, no distress.  Skin: normal turgor, no rashes, warm and dry HEENT: Normocephalic, atraumatic. Pupils equal round and reactive to light; sclera anicteric; extraocular muscles intact;  Nose without nasal septal hypertrophy Mouth/Parynx benign; Mallinpatti scale 3 Neck: No JVD, no carotid bruits; normal carotid upstroke Lungs: clear to ausculatation and percussion; no wheezing or rales Chest wall: without tenderness to palpitation Heart: PMI not displaced, RRR, s1 s2 normal, 2/6 systolic murmur, no diastolic murmur, no rubs, gallops, thrills, or heaves Abdomen: soft, nontender; no hepatosplenomehaly,  BS+; abdominal aorta nontender and not dilated by palpation. Back: no CVA tenderness Pulses 2+ Musculoskeletal: full range of motion, normal strength, no joint deformities Extremities: no clubbing cyanosis or edema, Homan's sign negative  Neurologic: grossly nonfocal; Cranial nerves grossly wnl Psychologic: Normal mood and affect  ECG (independently read by me):  Sinus bradycardia at 47, 1st degree AV block; 1st degree AV block, PR 222 msec  September 24, 2020 ECG (independently read by me): Sinus bradycardia at 51, 1 st degree AV block, PR 220 msec; QTc 401 msec  May 29, 2020 ECG (independently read by me): Sinus bradycardia 51 bpm, PACs, first-degree AV block with a PR 212 ms.  There is no evidence for LV strain or LVH.  November 2021 ECG (independently read by me): Sinus bradycardia at 48 bpm, first-degree AV block, PR 216 ms.  No ectopy.  No ECG changes.  QTc interval normal at 398 ms  April14, 2021 ECG (independently read by me): Sinus bradycardia at 54 bpm with mild sinus arrhythmia.  First-degree AV block with a PR interval at 214 ms.  No ectopy.  September 2020 ECG (independently read by me): Sinus bradycardia 54 bpm, isolated PAC, RV conduction delay/incomplete right bundle branch block.  PR interval 202 ms.  March 2020 ECG (independently read by me): Sinus rhythm at 60 bpm with mild sinus arrhythmia.  Normal intervals  September 2019 ECG (independently read by me): Sinus bradycardia at 51 bpm.  Mild RV conduction  delay.  No significant ST changes.  PR interval 200 ms.  October 2018 ECG (independently read by me): Normal sinus rhythm with PACs.  PR interval 200 ms, QTc interval 412 ms.  April 2018 ECG (independently read by me): Normal sinus rhythm with sinus arrhythmia with an average heart rate at 60 bpm.  Normal intervals.  Small inferior Q waves.  February 2017 ECG (independently read by me): Sinus bradycardia with mild sinus arrhythmia at 58 bpm.  Mild RV conduction delay.  No  significant ST segment changes.  July 2016 ECG (independently read by me): Sinus bradycardia with mild sinus arrhythmia, heart rate ranging from 48-58.  December 2014 ECG: Normal sinus rhythm at 54 beats per minute; normal intervals.  LABS: I personally reviewed the blood work  from [facility] done on 07/19/2015.   Total cholesterol 142, triglycerides 120, HDL 61, LDL 57.  I extensively reviewed laboratory from [facility] primary care from 08/29/2016  I personally reviewed the laboratory as noted above from February 2019 Total cholesterol 125, triglycerides 73, HDL 49, LDL 61.  Personally reviewed lab work from March 22, 2019.  BMP Latest Ref Rng & Units 03/27/2016 03/26/2016 03/18/2016  Glucose 65 - 99 mg/dL 99 - 96  BUN 6 - 20 mg/dL 16 - 17  Creatinine 0.61 - 1.24 mg/dL 1.08 0.96 1.06  Sodium 135 - 145 mmol/L 137 - 135  Potassium 3.5 - 5.1 mmol/L 4.0 - 4.5  Chloride 101 - 111 mmol/L 108 - 103  CO2 22 - 32 mmol/L 24 - 24  Calcium 8.9 - 10.3 mg/dL 8.3(L) - 9.9   Hepatic Function Latest Ref Rng & Units 03/18/2016 01/15/2016 01/13/2016  Total Protein 6.5 - 8.1 g/dL 7.7 5.6(L) 5.8(L)  Albumin 3.5 - 5.0 g/dL 4.2 1.7(L) 1.5(L)  AST 15 - 41 U/L 22 53(H) 39  ALT 17 - 63 U/L 16(L) 56 60  Alk Phosphatase 38 - 126 U/L 70 116 128(H)  Total Bilirubin 0.3 - 1.2 mg/dL 0.6 0.7 0.5  Bilirubin, Direct 0.1 - 0.5 mg/dL - - -   CBC Latest Ref Rng & Units 03/27/2016 03/26/2016 03/18/2016  WBC 4.0 - 10.5 K/uL 7.3 8.0 6.8  Hemoglobin 13.0 - 17.0 g/dL 10.5(L) 11.4(L) 12.5(L)  Hematocrit 39.0 - 52.0 % 31.5(L) 33.4(L) 38.2(L)  Platelets 150 - 400 K/uL 175 184 226   Lab Results  Component Value Date   MCV 94.6 03/27/2016   MCV 93.8 03/26/2016   MCV 94.8 03/18/2016   Lab Results  Component Value Date   TSH 4.322 01/10/2016   Lab Results  Component Value Date   HGBA1C 7.2 (H) 01/10/2016     Lipid Panel     Component Value Date/Time   CHOL 145 01/18/2014 0803   CHOL 139 08/30/2012 0845   TRIG 70  01/18/2014 0803   TRIG 82 08/30/2012 0845   HDL 64 01/18/2014 0803   HDL 68 08/30/2012 0845   CHOLHDL 2.3 01/18/2014 0803   VLDL 14 01/18/2014 0803   LDLCALC 67 01/18/2014 0803   LDLCALC 55 08/30/2012 0845    ECHO:09/06/2020 IMPRESSIONS   1. Severe AS with mean gradient 47 mmg; DI 0.26; mild AI.   2. Left ventricular ejection fraction, by estimation, is 60 to 65%. The  left ventricle has normal function. The left ventricle has no regional  wall motion abnormalities. There is mild left ventricular hypertrophy.  Left ventricular diastolic parameters  are consistent with Grade I diastolic dysfunction (impaired relaxation).  Elevated left atrial pressure.  3. Right ventricular systolic function is normal. The right ventricular  size is normal. There is normal pulmonary artery systolic pressure.   4. Left atrial size was moderately dilated.   5. Right atrial size was moderately dilated.   6. The mitral valve is normal in structure. Trivial mitral valve  regurgitation. No evidence of mitral stenosis. Moderate mitral annular  calcification.   7. The aortic valve is tricuspid. Aortic valve regurgitation is mild.  Severe aortic valve stenosis.   8. The inferior vena cava is normal in size with greater than 50%  respiratory variability, suggesting right atrial pressure of 3 mmHg.   Comparison(s): No significant change from prior study.   FINDINGS   Left Ventricle: Left ventricular ejection fraction, by estimation, is 60  to 65%. The left ventricle has normal function. The left ventricle has no  regional wall motion abnormalities. The left ventricular internal cavity  size was normal in size. There is   mild left ventricular hypertrophy. Left ventricular diastolic parameters  are consistent with Grade I diastolic dysfunction (impaired relaxation).  Elevated left atrial pressure.   Right Ventricle: The right ventricular size is normal. Right ventricular  systolic function is normal.  There is normal pulmonary artery systolic  pressure. The tricuspid regurgitant velocity is 2.67 m/s, and with an  assumed right atrial pressure of 3 mmHg,   the estimated right ventricular systolic pressure is 37.1 mmHg.   Left Atrium: Left atrial size was moderately dilated.   Right Atrium: Right atrial size was moderately dilated.   Pericardium: There is no evidence of pericardial effusion.   Mitral Valve: The mitral valve is normal in structure. Moderate mitral  annular calcification. Trivial mitral valve regurgitation. No evidence of  mitral valve stenosis.   Tricuspid Valve: The tricuspid valve is normal in structure. Tricuspid  valve regurgitation is mild . No evidence of tricuspid stenosis.   Aortic Valve: The aortic valve is tricuspid. Aortic valve regurgitation is  mild. Aortic regurgitation PHT measures 533 msec. Severe aortic stenosis  is present. Aortic valve mean gradient measures 47.5 mmHg. Aortic valve  peak gradient measures 77.1 mmHg.  Aortic valve area, by VTI measures 0.98 cm.   Pulmonic Valve: The pulmonic valve was not well visualized. Pulmonic valve  regurgitation is not visualized. No evidence of pulmonic stenosis.   Aorta: The aortic root is normal in size and structure.   Venous: The inferior vena cava is normal in size with greater than 50%  respiratory variability, suggesting right atrial pressure of 3 mmHg.   IAS/Shunts: No atrial level shunt detected by color flow Doppler.   Additional Comments: Severe AS with mean gradient 47 mmg; DI 0.26; mild  AI.      LEFT VENTRICLE  PLAX 2D  LVIDd:         4.70 cm  Diastology  LVIDs:         3.40 cm  LV e' medial:    4.14 cm/s  LV PW:         1.20 cm  LV E/e' medial:  24.0  LV IVS:        1.30 cm  LV e' lateral:   6.32 cm/s  LVOT diam:     2.20 cm  LV E/e' lateral: 15.7  LV SV:         114  LV SV Index:   61  LVOT Area:     3.80 cm  3D Volume EF:                           3D EF:        66 %                          LV EDV:       119 ml                          LV ESV:       41 ml                          LV SV:        79 ml   RIGHT VENTRICLE  RV S prime:     10.30 cm/s  TAPSE (M-mode): 1.4 cm   LEFT ATRIUM             Index       RIGHT ATRIUM           Index  LA diam:        3.40 cm 1.82 cm/m  RA Area:     23.40 cm  LA Vol (A2C):   56.8 ml 30.41 ml/m RA Volume:   77.70 ml  41.61 ml/m  LA Vol (A4C):   51.9 ml 27.79 ml/m  LA Biplane Vol: 57.5 ml 30.79 ml/m   AORTIC VALVE  AV Area (Vmax):    1.03 cm  AV Area (Vmean):   0.94 cm  AV Area (VTI):     0.98 cm  AV Vmax:           439.00 cm/s  AV Vmean:          332.500 cm/s  AV VTI:            1.155 m  AV Peak Grad:      77.1 mmHg  AV Mean Grad:      47.5 mmHg  LVOT Vmax:         119.00 cm/s  LVOT Vmean:        82.000 cm/s  LVOT VTI:          0.299 m  LVOT/AV VTI ratio: 0.26  AI PHT:            533 msec     AORTA  Ao Root diam: 3.10 cm  Ao Asc diam:  3.70 cm   MITRAL VALVE                TRICUSPID VALVE  MV Area (PHT): 2.37 cm     TR Peak grad:   28.5 mmHg  MV Decel Time: 320 msec     TR Vmax:        267.00 cm/s  MV E velocity: 99.20 cm/s  MV A velocity: 122.00 cm/s  SHUNTS  MV E/A ratio:  0.81         Systemic VTI:  0.30 m                              Systemic Diam: 2.20 cm    IMPRESSION:  No diagnosis found.   ASSESSMENT AND PLAN DillonMinor is an 88 year old gentleman who underwent emergent CABG revascularization surgery for life-threatening coronary anatomy in November 2009 by Dr.Bartle.  He has a history  of peripheral vascular disease and underwent successful right carotid endarterectomy by Dr. [name].  Most recent carotid studies from February 2022 were reviewed with prior studies and continue to show stable velocities in the 1 to 39% range bilaterally with normal vertebral and subclavian flow.  Mr. Dunlow continues to feel well and denies any chest pain palpitations or significant  change in activity.  He continues to play golf where he walks up and down hills without dyspnea.  However if he does overextend himself as he has noticed some very minimal shortness of breath compared to previously.  He has a history of aortic valve stenosis and over the years his aortic valve gradient has progressed and his aortic valve stenosis is now in the serious range.  His echo Doppler study from April 2022 showed a mean 40 mm with a peak gradient of 70 and aortic valve area of approximately 1 cm.  During his May 29, 2020 I discussed the progression of his aortic stenosis with him in detail and discussed the potential need for aortic valve surgery.  At that time he insisted that he was asymptomatic and felt well.  I recommended a follow-up echo Doppler study be obtained and this was recently completed on September 06, 2020.  This shows slight additional progression with a mean gradient now at 47 and peak gradient at 77 with aortic valve area 0.98 cm.  He continues to play golf and will be playing in a tournament in September.  He does not walk but uses a golf cart 2 days/week.  He also remains active doing yard work.  He has developed renal insufficiency and most recent creatinine has increased from 1.67 to1.71.  His blood pressure today when taken by me was 142/70 with a resting pulse at 51.  He has continued to be on amlodipine 5 mg, losartan 50 mg, metoprolol succinate 50 mg daily to his rosuvastatin 40 mg.  He is on levothyroxine for hypothyroidism and 112 mcg.  He has continued to be on aspirin and Plavix for DAPT.  I had a long discussion with him today particularly with reference to continued slight progression of his aortic stenosis.  He is now 88 years old.  Dr. Cyndia Bent had performed CABG revascularization surgery in 2009 and also performs TAVR.  I have recommended he see Dr. Cyndia Bent for evaluation and potential future candidacy for TAVR.  Ultimately he will need to undergo a right and left heart cardiac  catheterization.  I would not schedule this presently pending his evaluation.  In the past I had reduced his losartan to 50 mg.  This may need to be further decreased if renal function worsens.  He also was instructed that if his resting heart rate is consistently less than 54 to reduce his metoprolol succinate to 25 mg.  If the decision is to continue watchful waiting in 6 months I have recommended a follow-up echo evaluation with office visit.  However pending Dr. Vivi Martens if the decision is to proceed with AVR sooner catheterization will need to be performed sooner.     Troy Sine, MD, [facility]  04/08/2021 12:20 PM

## 2021-04-14 ENCOUNTER — Encounter: Payer: Self-pay | Admitting: Cardiovascular Disease

## 2021-04-25 ENCOUNTER — Encounter: Payer: Self-pay | Admitting: Cardiovascular Disease

## 2021-04-25 ENCOUNTER — Ambulatory Visit: Payer: PPO | Admitting: Cardiovascular Disease

## 2021-04-25 VITALS — BP 144/70 | HR 56 | Ht 65.5 in | Wt 175.0 lb

## 2021-04-25 DIAGNOSIS — Z01810 Encounter for preprocedural cardiovascular examination: Secondary | ICD-10-CM

## 2021-04-25 DIAGNOSIS — I350 Nonrheumatic aortic (valve) stenosis: Secondary | ICD-10-CM

## 2021-04-25 NOTE — Patient Instructions (Addendum)
Medication Instructions:  ?Your physician recommends that you continue on your current medications as directed. Please refer to the Current Medication list given to you today. ? ?*If you need a refill on your cardiac medications before your next appointment, please call your pharmacy* ? ? ?Lab Work: ?BMP, CBC on 05/03/21 ?If you have labs (blood work) drawn today and your tests are completely normal, you will receive your results only by: ?MyChart Message (if you have MyChart) OR ?A paper copy in the mail ?If you have any lab test that is abnormal or we need to change your treatment, we will call you to review the results. ? ? ?Testing/Procedures: ?R & L heart Cath ?Your physician has requested that you have a cardiac catheterization. Cardiac catheterization is used to diagnose and/or treat various heart conditions. Doctors may recommend this procedure for a number of different reasons. The most common reason is to evaluate chest pain. Chest pain can be a symptom of coronary artery disease (CAD), and cardiac catheterization can show whether plaque is narrowing or blocking your heart?s arteries. This procedure is also used to evaluate the valves, as well as measure the blood flow and oxygen levels in different parts of your heart. For further information please visit [email or web address]. Please follow instruction sheet, as given. ? ? ?Follow-Up: ?Structural team will follow up: Dr Dillon [name] referral and CT scans ? ?Provider:   ?Dillon Mocha, MD ? ? ?Other Instructions ? ?[name] ?[address], [address] ?[place] [address] ?Dept: [phone] ?Loc: [phone] ? ?Dillon [name]  04/25/2021 ? ?You are scheduled for a Cardiac Catheterization on Tuesday, April 18 with Dr. Shelva [name]. ? ?1. Please arrive at the Main Entrance A at [facility]: [place], [place] [address] at 7:00 AM (This time is five hours  before your procedure to ensure your preparation). Free valet parking service is available.  ? ?Special note: Every effort is made to have your procedure done on time. Please understand that emergencies sometimes delay scheduled procedures. ? ?2. Diet: Do not eat solid foods after midnight.  You may have clear liquids until 5 AM upon the day of the procedure. ? ?3. Labs: You will need to have blood drawn on Friday, April 7 at [facility] at [facility]. [address], [place]  ?Open: 7:30am - 5pm    Phone: [phone]. You do not need to be fasting. ? ?4. Medication instructions in preparation for your procedure: ? ? Contrast Allergy: No ? ?DO NOT take your AMLODIPINE or LOSARTAN day before or day of procedure ? ?On the morning of your procedure, take Aspirin '81mg'$  and Plavix, as well as, any morning medicines NOT listed above.  You may use sips of water. ? ?5. Plan to go home the same day, you will only stay overnight if medically necessary. ?6. You MUST have a responsible adult to drive you home. ?7. An adult MUST be with you the first 24 hours after you arrive home. ?8. Bring a current list of your medications, and the last time and date medication taken. ?9. Bring ID and current insurance cards. ?10.Please wear clothes that are easy to get on and off and wear slip-on shoes. ? ?Thank you for allowing [place] to care for you! ?  -- [facility]   ?  ?

## 2021-04-25 NOTE — H&P (View-Only) (Signed)
?Cardiology Office Note:   ? ?Date:  04/25/2021  ? ?ID:  Dillon [name], DOB 19-Feb-1933, MRN [id number] ? ?PCP:  Drosinis, Pamalee Leyden, PA-C ?  ?[facility] Providers ?Cardiologist:  Shelva Majestic, MD    ? ?Referring MD: Drosinis, Pamalee Leyden, PA-C  ? ?Chief Complaint  ?Patient presents with  ? Shortness of Breath  ? ? ? ?History of Present Illness:   ? ?Dillon [name] is a 88 y.o. male with a hx of aortic stenosis, presenting for follow-up evaluation.  The patient was initially seen for structural heart consultation in October 2022.  His cardiac history dates back to 2009 when he was having exertional angina and had a high risk nuclear stress test.  He was admitted for cardiac catheterization which demonstrated critical left main and multivessel disease and the patient was taken emergently for coronary bypass surgery by Dr. Cyndia Bent.  He was treated with a LIMA to LAD, saphenous vein graft to OM, and saphenous vein graft to RCA.  The patient has done well since that time.  He has been followed for carotid stenosis, hypertension, and mixed hyperlipidemia.  The patient has been noted to have aortic stenosis and has been followed now for many years.  In 2020 and 2021 he had findings consistent with moderate aortic stenosis.  However, in spring 2022 the patient's echocardiogram demonstrated progressive and now severe aortic stenosis with a mean transvalvular gradient greater than 40 mmHg.  A follow-up echocardiogram in August 2022 showed stable findings with a mean gradient of 47 mmHg.  His recent echo continues to show normal LV function with mild LVH.  Aortic gradients have continued to worsen now with a peak gradient of 83 mmHg and a mean gradient of 53 mmHg.  The patient reports mild fatigue and mild exertional dyspnea with certain physical activities such as working in his yard.  He really minimizes his symptoms and states that he is not bothered much, but does note that it may take him longer to do the same activities that he was  able to do in the past.  He denies lightheadedness, presyncope, chest pain, chest pressure, orthopnea, PND, or leg swelling. ? ?Past Medical History:  ?Diagnosis Date  ? CAD (coronary artery disease)   ? Heart murmur   ? History of hiatal hernia   ? Hypertension   ? Hypothyroidism   ? Pneumonia 12/2015  ? hx  ? S/P CABG x 3 12/17/07  ? LIMA to LAD,SVG to left C  ? ? ?Past Surgical History:  ?Procedure Laterality Date  ? CORONARY ARTERY BYPASS GRAFT  12/17/07  ? LIMA to LAD,vein to obtuse marginal,vein to RCA  ? EMPYEMA DRAINAGE Left 01/11/2016  ? Procedure: EMPYEMA DRAINAGE;  Surgeon: Ivin Poot, MD;  Location: [place];  Service: Thoracic;  Laterality: Left;  ? ENDARTERECTOMY Right 03/26/2016  ? Procedure: RIGHT CAROTID ENDARTERECTOMY;  Surgeon: Serafina Mitchell, MD;  Location: [place];  Service: Vascular;  Laterality: Right;  ? PATCH ANGIOPLASTY Right 03/26/2016  ? Procedure: PATCH ANGIOPLASTY USING Rueben Bash BIOLOGIC PATCH;  Surgeon: Serafina Mitchell, MD;  Location: [place];  Service: Vascular;  Laterality: Right;  ? VIDEO ASSISTED THORACOSCOPY (VATS)/DECORTICATION Left 01/11/2016  ? Procedure: VIDEO ASSISTED THORACOSCOPY (VATS)/DECORTICATION;  Surgeon: Ivin Poot, MD;  Location: [place];  Service: Thoracic;  Laterality: Left;  ? ? ?Current Medications: ?Current Meds  ?Medication Sig  ? amLODipine (NORVASC) 5 MG tablet Take 1 tablet by mouth daily  ? aspirin EC 81 MG tablet  Take 81 mg by mouth at bedtime.   ? Cholecalciferol (VITAMIN D) 2000 units tablet Take 2,000 Units by mouth at bedtime.  ? clopidogrel (PLAVIX) 75 MG tablet Take 1 tablet by mouth once daily  ? Cyanocobalamin (B-12) 2000 MCG TABS   ? fexofenadine (ALLEGRA) 180 MG tablet Take 180 mg by mouth daily.  ? folic acid (FOLVITE) 1 MG tablet Take 1 tablet (1 mg total) by mouth daily.  ? levothyroxine (SYNTHROID, LEVOTHROID) 112 MCG tablet Take 112 mcg by mouth daily.  ? metoprolol succinate (TOPROL-XL) 50 MG 24 hr tablet Take 1 tablet (50 mg total) by  mouth daily. Take with or immediately following a meal.  ? mupirocin cream (BACTROBAN) 2 % Apply 1 application topically daily as needed (after skin treatments).   ? Omega-3 1400 MG CAPS Take by mouth daily.  ? rosuvastatin (CRESTOR) 40 MG tablet Take 1 tablet by mouth every day  ? terbinafine (LAMISIL) 1 % cream Apply 1 application topically daily as needed (rash).   ?  ? ?Allergies:   Patient has no known allergies.  ? ?Social History  ? ?Socioeconomic History  ? Marital status: Married  ?  Spouse name: Not on file  ? Number of children: Not on file  ? Years of education: Not on file  ? Highest education level: Not on file  ?Occupational History  ? Not on file  ?Tobacco Use  ? Smoking status: Former  ?  Types: Cigarettes  ?  Quit date: 01/28/1971  ?  Years since quitting: 50.2  ?  Passive exposure: Never  ? Smokeless tobacco: Never  ?Vaping Use  ? Vaping Use: Never used  ?Substance and Sexual Activity  ? Alcohol use: Yes  ?  Comment: socially  ? Drug use: No  ? Sexual activity: Not on file  ?Other Topics Concern  ? Not on file  ?Social History Narrative  ? Not on file  ? ?Social Determinants of Health  ? ?Financial Resource Strain: Not on file  ?Food Insecurity: Not on file  ?Transportation Needs: Not on file  ?Physical Activity: Not on file  ?Stress: Not on file  ?Social Connections: Not on file  ?  ? ?Family History: ?The patient's family history includes Heart attack in his father. ? ?ROS:   ?Please see the history of present illness.    ?All other systems reviewed and are negative. ? ?EKGs/Labs/Other Studies Reviewed:   ? ?The following studies were reviewed today: ?Echo 02/23: ?1. Left ventricular ejection fraction, by estimation, is 60 to 65%. The  ?left ventricle has normal function. The left ventricle has no regional  ?wall motion abnormalities. There is mild left ventricular hypertrophy.  ?Left ventricular diastolic parameters  ?were normal.  ? 2. Right ventricular systolic function is normal. The right  ventricular  ?size is normal. There is normal pulmonary artery systolic pressure.  ? 3. Left atrial size was moderately dilated.  ? 4. The mitral valve is degenerative. Trivial mitral valve regurgitation.  ?No evidence of mitral stenosis. Moderate mitral annular calcification.  ? 5. Gradients have increased and AVA decreased since echo done 8.11/22.  ?The aortic valve is tricuspid. There is severe calcifcation of the aortic  ?valve. There is severe thickening of the aortic valve. Aortic valve  ?regurgitation is mild. Severe aortic  ?valve stenosis.  ? 6. The inferior vena cava is normal in size with greater than 50%  ?respiratory variability, suggesting right atrial pressure of 3 mmHg.  ? ?LVIDd:  4.50 cm   Diastology  ?LVIDs:         2.60 cm   LV e' medial:    5.00 cm/s  ?LV PW:         0.90 cm   LV E/e' medial:  22.4  ?LV IVS:        1.30 cm   LV e' lateral:   9.03 cm/s  ?LVOT diam:     2.00 cm   LV E/e' lateral: 12.4  ?LV SV:         103  ?LV SV Index:   55        2D Longitudinal Strain  ?LVOT Area:     3.14 cm?  2D Strain GLS (A2C):   -16.6 %  ?                         2D Strain GLS (A3C):   -18.8 %  ?                         2D Strain GLS (A4C):   -18.1 %  ?                         2D Strain GLS Avg:     -17.8 %  ? ?RIGHT VENTRICLE  ?RV Basal diam:  4.15 cm  ?RV Mid diam:    3.30 cm  ?RV S prime:     10.40 cm/s  ?TAPSE (M-mode): 2.6 cm  ?RVSP:           33.9 mmHg  ? ?LEFT ATRIUM             Index        RIGHT ATRIUM           Index  ?LA diam:        4.40 cm 2.33 cm/m?   RA Pressure: 3.00 mmHg  ?LA Vol (A2C):   67.3 ml 35.64 ml/m?  RA Area:     16.90 cm?  ?LA Vol (A4C):   69.2 ml 36.64 ml/m?  RA Volume:   43.10 ml  22.82 ml/m?  ?LA Biplane Vol: 66.7 ml 35.32 ml/m?  ? AORTIC VALVE  ?AV Area (Vmax):    0.75 cm?  ?AV Area (Vmean):   0.71 cm?  ?AV Area (VTI):     0.80 cm?  ?AV Vmax:           456.00 cm/s  ?AV Vmean:          352.000 cm/s  ?AV VTI:            1.290 m  ?AV Peak Grad:      83.2 mmHg  ?AV Mean  Grad:      53.0 mmHg  ?LVOT Vmax:         109.00 cm/s  ?LVOT Vmean:        79.600 cm/s  ?LVOT VTI:          0.329 m  ?LVOT/AV VTI ratio: 0.26  ?AI PHT:            504 msec  ?   ?AORTA  ?Ao Root diam: 3.10 cm

## 2021-04-25 NOTE — Progress Notes (Signed)
?Cardiology Office Note:   ? ?Date:  04/25/2021  ? ?ID:  Dillon [name], DOB 1933-07-28, MRN [id number] ? ?PCP:  Drosinis, Pamalee Leyden, PA-C ?  ?[facility] Providers ?Cardiologist:  Shelva Majestic, MD    ? ?Referring MD: Drosinis, Pamalee Leyden, PA-C  ? ?Chief Complaint  ?Patient presents with  ? Shortness of Breath  ? ? ? ?History of Present Illness:   ? ?Dillon [name] is a 88 y.o. male with a hx of aortic stenosis, presenting for follow-up evaluation.  The patient was initially seen for structural heart consultation in October 2022.  His cardiac history dates back to 2009 when he was having exertional angina and had a high risk nuclear stress test.  He was admitted for cardiac catheterization which demonstrated critical left main and multivessel disease and the patient was taken emergently for coronary bypass surgery by Dr. Cyndia Bent.  He was treated with a LIMA to LAD, saphenous vein graft to OM, and saphenous vein graft to RCA.  The patient has done well since that time.  He has been followed for carotid stenosis, hypertension, and mixed hyperlipidemia.  The patient has been noted to have aortic stenosis and has been followed now for many years.  In 2020 and 2021 he had findings consistent with moderate aortic stenosis.  However, in spring 2022 the patient's echocardiogram demonstrated progressive and now severe aortic stenosis with a mean transvalvular gradient greater than 40 mmHg.  A follow-up echocardiogram in August 2022 showed stable findings with a mean gradient of 47 mmHg.  His recent echo continues to show normal LV function with mild LVH.  Aortic gradients have continued to worsen now with a peak gradient of 83 mmHg and a mean gradient of 53 mmHg.  The patient reports mild fatigue and mild exertional dyspnea with certain physical activities such as working in his yard.  He really minimizes his symptoms and states that he is not bothered much, but does note that it may take him longer to do the same activities that he was  able to do in the past.  He denies lightheadedness, presyncope, chest pain, chest pressure, orthopnea, PND, or leg swelling. ? ?Past Medical History:  ?Diagnosis Date  ? CAD (coronary artery disease)   ? Heart murmur   ? History of hiatal hernia   ? Hypertension   ? Hypothyroidism   ? Pneumonia 12/2015  ? hx  ? S/P CABG x 3 12/17/07  ? LIMA to LAD,SVG to left C  ? ? ?Past Surgical History:  ?Procedure Laterality Date  ? CORONARY ARTERY BYPASS GRAFT  12/17/07  ? LIMA to LAD,vein to obtuse marginal,vein to RCA  ? EMPYEMA DRAINAGE Left 01/11/2016  ? Procedure: EMPYEMA DRAINAGE;  Surgeon: Ivin Poot, MD;  Location: [place];  Service: Thoracic;  Laterality: Left;  ? ENDARTERECTOMY Right 03/26/2016  ? Procedure: RIGHT CAROTID ENDARTERECTOMY;  Surgeon: Serafina Mitchell, MD;  Location: [place];  Service: Vascular;  Laterality: Right;  ? PATCH ANGIOPLASTY Right 03/26/2016  ? Procedure: PATCH ANGIOPLASTY USING Rueben Bash BIOLOGIC PATCH;  Surgeon: Serafina Mitchell, MD;  Location: [place];  Service: Vascular;  Laterality: Right;  ? VIDEO ASSISTED THORACOSCOPY (VATS)/DECORTICATION Left 01/11/2016  ? Procedure: VIDEO ASSISTED THORACOSCOPY (VATS)/DECORTICATION;  Surgeon: Ivin Poot, MD;  Location: [place];  Service: Thoracic;  Laterality: Left;  ? ? ?Current Medications: ?Current Meds  ?Medication Sig  ? amLODipine (NORVASC) 5 MG tablet Take 1 tablet by mouth daily  ? aspirin EC 81 MG tablet  Take 81 mg by mouth at bedtime.   ? Cholecalciferol (VITAMIN D) 2000 units tablet Take 2,000 Units by mouth at bedtime.  ? clopidogrel (PLAVIX) 75 MG tablet Take 1 tablet by mouth once daily  ? Cyanocobalamin (B-12) 2000 MCG TABS   ? fexofenadine (ALLEGRA) 180 MG tablet Take 180 mg by mouth daily.  ? folic acid (FOLVITE) 1 MG tablet Take 1 tablet (1 mg total) by mouth daily.  ? levothyroxine (SYNTHROID, LEVOTHROID) 112 MCG tablet Take 112 mcg by mouth daily.  ? metoprolol succinate (TOPROL-XL) 50 MG 24 hr tablet Take 1 tablet (50 mg total) by  mouth daily. Take with or immediately following a meal.  ? mupirocin cream (BACTROBAN) 2 % Apply 1 application topically daily as needed (after skin treatments).   ? Omega-3 1400 MG CAPS Take by mouth daily.  ? rosuvastatin (CRESTOR) 40 MG tablet Take 1 tablet by mouth every day  ? terbinafine (LAMISIL) 1 % cream Apply 1 application topically daily as needed (rash).   ?  ? ?Allergies:   Patient has no known allergies.  ? ?Social History  ? ?Socioeconomic History  ? Marital status: Married  ?  Spouse name: Not on file  ? Number of children: Not on file  ? Years of education: Not on file  ? Highest education level: Not on file  ?Occupational History  ? Not on file  ?Tobacco Use  ? Smoking status: Former  ?  Types: Cigarettes  ?  Quit date: 01/28/1971  ?  Years since quitting: 50.2  ?  Passive exposure: Never  ? Smokeless tobacco: Never  ?Vaping Use  ? Vaping Use: Never used  ?Substance and Sexual Activity  ? Alcohol use: Yes  ?  Comment: socially  ? Drug use: No  ? Sexual activity: Not on file  ?Other Topics Concern  ? Not on file  ?Social History Narrative  ? Not on file  ? ?Social Determinants of Health  ? ?Financial Resource Strain: Not on file  ?Food Insecurity: Not on file  ?Transportation Needs: Not on file  ?Physical Activity: Not on file  ?Stress: Not on file  ?Social Connections: Not on file  ?  ? ?Family History: ?The patient's family history includes Heart attack in his father. ? ?ROS:   ?Please see the history of present illness.    ?All other systems reviewed and are negative. ? ?EKGs/Labs/Other Studies Reviewed:   ? ?The following studies were reviewed today: ?Echo 02/23: ?1. Left ventricular ejection fraction, by estimation, is 60 to 65%. The  ?left ventricle has normal function. The left ventricle has no regional  ?wall motion abnormalities. There is mild left ventricular hypertrophy.  ?Left ventricular diastolic parameters  ?were normal.  ? 2. Right ventricular systolic function is normal. The right  ventricular  ?size is normal. There is normal pulmonary artery systolic pressure.  ? 3. Left atrial size was moderately dilated.  ? 4. The mitral valve is degenerative. Trivial mitral valve regurgitation.  ?No evidence of mitral stenosis. Moderate mitral annular calcification.  ? 5. Gradients have increased and AVA decreased since echo done 8.11/22.  ?The aortic valve is tricuspid. There is severe calcifcation of the aortic  ?valve. There is severe thickening of the aortic valve. Aortic valve  ?regurgitation is mild. Severe aortic  ?valve stenosis.  ? 6. The inferior vena cava is normal in size with greater than 50%  ?respiratory variability, suggesting right atrial pressure of 3 mmHg.  ? ?LVIDd:  4.50 cm   Diastology  ?LVIDs:         2.60 cm   LV e' medial:    5.00 cm/s  ?LV PW:         0.90 cm   LV E/e' medial:  22.4  ?LV IVS:        1.30 cm   LV e' lateral:   9.03 cm/s  ?LVOT diam:     2.00 cm   LV E/e' lateral: 12.4  ?LV SV:         103  ?LV SV Index:   55        2D Longitudinal Strain  ?LVOT Area:     3.14 cm?  2D Strain GLS (A2C):   -16.6 %  ?                         2D Strain GLS (A3C):   -18.8 %  ?                         2D Strain GLS (A4C):   -18.1 %  ?                         2D Strain GLS Avg:     -17.8 %  ? ?RIGHT VENTRICLE  ?RV Basal diam:  4.15 cm  ?RV Mid diam:    3.30 cm  ?RV S prime:     10.40 cm/s  ?TAPSE (M-mode): 2.6 cm  ?RVSP:           33.9 mmHg  ? ?LEFT ATRIUM             Index        RIGHT ATRIUM           Index  ?LA diam:        4.40 cm 2.33 cm/m?   RA Pressure: 3.00 mmHg  ?LA Vol (A2C):   67.3 ml 35.64 ml/m?  RA Area:     16.90 cm?  ?LA Vol (A4C):   69.2 ml 36.64 ml/m?  RA Volume:   43.10 ml  22.82 ml/m?  ?LA Biplane Vol: 66.7 ml 35.32 ml/m?  ? AORTIC VALVE  ?AV Area (Vmax):    0.75 cm?  ?AV Area (Vmean):   0.71 cm?  ?AV Area (VTI):     0.80 cm?  ?AV Vmax:           456.00 cm/s  ?AV Vmean:          352.000 cm/s  ?AV VTI:            1.290 m  ?AV Peak Grad:      83.2 mmHg  ?AV Mean  Grad:      53.0 mmHg  ?LVOT Vmax:         109.00 cm/s  ?LVOT Vmean:        79.600 cm/s  ?LVOT VTI:          0.329 m  ?LVOT/AV VTI ratio: 0.26  ?AI PHT:            504 msec  ?   ?AORTA  ?Ao Root diam: 3.10 cm

## 2021-04-25 NOTE — Progress Notes (Addendum)
Pre Surgical Assessment: 5 M Walk Test  43M=16.10f  5 Meter Walk Test- trial 1: 6.07 seconds 5 Meter Walk Test- trial 2: 5.72 seconds 5 Meter Walk Test- trial 3: 5.32 seconds 5 Meter Walk Test Average: 5.70 seconds  STS risk score: Isolated AVR Risk of Mortality: 4.768% Renal Failure: 6.040% Permanent Stroke: 5.168% Prolonged Ventilation: 15.038% DSW Infection: 0.082% Reoperation: 3.791% Morbidity or Mortality: 23.012% Short Length of Stay: 18.811% Long Length of Stay: 12.328%

## 2021-05-03 ENCOUNTER — Other Ambulatory Visit: Payer: Self-pay

## 2021-05-03 ENCOUNTER — Emergency Department (HOSPITAL_BASED_OUTPATIENT_CLINIC_OR_DEPARTMENT_OTHER): Payer: PPO

## 2021-05-03 ENCOUNTER — Encounter (HOSPITAL_BASED_OUTPATIENT_CLINIC_OR_DEPARTMENT_OTHER): Payer: Self-pay

## 2021-05-03 ENCOUNTER — Emergency Department (HOSPITAL_BASED_OUTPATIENT_CLINIC_OR_DEPARTMENT_OTHER)
Admission: EM | Admit: 2021-05-03 | Discharge: 2021-05-03 | Disposition: A | Discharge status: Home / Self Care | Payer: PPO | Attending: Emergency Medicine | Admitting: Emergency Medicine

## 2021-05-03 ENCOUNTER — Other Ambulatory Visit: Payer: Self-pay | Admitting: Physician Assistant

## 2021-05-03 ENCOUNTER — Telehealth: Payer: Self-pay | Admitting: Physician Assistant

## 2021-05-03 DIAGNOSIS — E875 Hyperkalemia: Secondary | ICD-10-CM | POA: Insufficient documentation

## 2021-05-03 DIAGNOSIS — N1832 Chronic kidney disease, stage 3b: Secondary | ICD-10-CM | POA: Insufficient documentation

## 2021-05-03 DIAGNOSIS — Z7901 Long term (current) use of anticoagulants: Secondary | ICD-10-CM | POA: Diagnosis not present

## 2021-05-03 DIAGNOSIS — Z01810 Encounter for preprocedural cardiovascular examination: Secondary | ICD-10-CM

## 2021-05-03 DIAGNOSIS — Z7982 Long term (current) use of aspirin: Secondary | ICD-10-CM | POA: Diagnosis not present

## 2021-05-03 DIAGNOSIS — R799 Abnormal finding of blood chemistry, unspecified: Secondary | ICD-10-CM | POA: Diagnosis present

## 2021-05-03 LAB — CBC WITH DIFFERENTIAL/PLATELET
Abs Immature Granulocytes: 0.03 10*3/uL (ref 0.00–0.07)
Basophils Absolute: 0.1 10*3/uL (ref 0.0–0.1)
Basophils Relative: 1 %
Eosinophils Absolute: 0.2 10*3/uL (ref 0.0–0.5)
Eosinophils Relative: 3 %
HCT: 38.8 % — ABNORMAL LOW (ref 39.0–52.0)
Hemoglobin: 12.8 g/dL — ABNORMAL LOW (ref 13.0–17.0)
Immature Granulocytes: 0 %
Lymphocytes Relative: 41 %
Lymphs Abs: 3 10*3/uL (ref 0.7–4.0)
MCH: 32.8 pg (ref 26.0–34.0)
MCHC: 33 g/dL (ref 30.0–36.0)
MCV: 99.5 fL (ref 80.0–100.0)
Monocytes Absolute: 0.6 10*3/uL (ref 0.1–1.0)
Monocytes Relative: 8 %
Neutro Abs: 3.5 10*3/uL (ref 1.7–7.7)
Neutrophils Relative %: 47 %
Platelets: 186 10*3/uL (ref 150–400)
RBC: 3.9 MIL/uL — ABNORMAL LOW (ref 4.22–5.81)
RDW: 12.9 % (ref 11.5–15.5)
WBC: 7.3 10*3/uL (ref 4.0–10.5)
nRBC: 0 % (ref 0.0–0.2)

## 2021-05-03 LAB — COMPREHENSIVE METABOLIC PANEL
ALT: 11 U/L (ref 0–44)
AST: 18 U/L (ref 15–41)
Albumin: 4.6 g/dL (ref 3.5–5.0)
Alkaline Phosphatase: 61 U/L (ref 38–126)
Anion gap: 9 (ref 5–15)
BUN: 35 mg/dL — ABNORMAL HIGH (ref 8–23)
CO2: 22 mmol/L (ref 22–32)
Calcium: 9.6 mg/dL (ref 8.9–10.3)
Chloride: 104 mmol/L (ref 98–111)
Creatinine, Ser: 1.78 mg/dL — ABNORMAL HIGH (ref 0.61–1.24)
GFR, Estimated: 37 mL/min — ABNORMAL LOW (ref >60)
Glucose, Bld: 122 mg/dL — ABNORMAL HIGH (ref 70–99)
Potassium: 5.3 mmol/L — ABNORMAL HIGH (ref 3.5–5.1)
Sodium: 135 mmol/L (ref 135–145)
Total Bilirubin: 0.4 mg/dL (ref 0.3–1.2)
Total Protein: 7.9 g/dL (ref 6.5–8.1)

## 2021-05-03 MED ORDER — SODIUM ZIRCONIUM CYCLOSILICATE 10 G PO PACK
10.0000 g | PACK | Freq: Once | ORAL | Status: DC
  Filled 2021-05-03: qty 1

## 2021-05-03 NOTE — ED Triage Notes (Signed)
Patient states he had his blood drawn this morning and was told his K+ was elevated at 6. Patient has no symptoms at this time ?

## 2021-05-03 NOTE — Telephone Encounter (Signed)
Received a page from [facility] [phone]) regarding elevated potassium of 6.0.  Creatinine is also elevated to 1.8.  Sodium is currently pending.  Although CBC also says pending, I was told his white blood cell count was 6.5, hemoglobin 12.2, hematocrit 36, platelet 186.  I discussed the case with DOD Dr. Oswaldo Milian.  Patient was recently diagnosed with severe aortic stenosis and was referred to Dr. Martinique for consideration of TAVR.  Blood work was done in anticipation of left and right heart cath by Dr. Claiborne Billings the week after next week.  Creatinine is significantly higher than 5 years ago at which point his creatinine was still 1.0.  However recent blood work done at outside facility shows his baseline creatinine is about 1.6.  He is being followed by nephrology service.  Not sure if he is dehydrated at this point.  Dr. Gardiner Rhyme recommended send the patient to the emergency room to have the blood work redrawn to make sure the elevated potassium is real.  Per [facility], there was no mention of hemolysis with blood draw. ? ?I managed to get in contact with [name], he understood the instruction and will go to the ED tonight to have lab work rechecked. ? ? ?

## 2021-05-03 NOTE — ED Provider Notes (Signed)
?[facility] EMERGENCY DEPT ?Provider Note ? ? ?CSN: [id number] ?Arrival date & time: 05/03/21  2014 ? ?  ? ?History ? ?Chief Complaint  ?Patient presents with  ? Abnormal Lab  ? ? ?Dillon [name] is a 88 y.o. male. ? ?Patient sent in from cardiology office for an elevated potassium of 6.  And also worsening renal function.  Patient has a history of chronic kidney disease is followed by [facility].  Has been for years.  Patient states he is on a potassium supplement.  And has been ever since his heart surgery.  Cardiology is preparing him for a repeat cardiac catheterization that they are planning for not this upcoming week but next week.  Patient's repeat labs here had a potassium of 5.3 BUN of 35 creatinine 1.78 GFR 37.  Appears patient's GFR is usually around the low 40s.  And his potassium is frequently been around 5.  Not clear why he is on a potassium supplement.  But patient swears that he has it is not listed on her medication list.  Patient denies any chest pain or shortness of breath.  EKG here without acute changes consistent with hyperkalemia. ? ? ?  ? ?Home Medications ?Prior to Admission medications   ?Medication Sig Start Date End Date Taking? Authorizing Provider  ?amLODipine (NORVASC) 5 MG tablet Take 1 tablet by mouth daily 08/23/20   Troy Sine, MD  ?aspirin EC 81 MG tablet Take 81 mg by mouth at bedtime.     [provider]  ?Cholecalciferol (VITAMIN D) 2000 units tablet Take 2,000 Units by mouth at bedtime.    [provider]  ?clopidogrel (PLAVIX) 75 MG tablet Take 1 tablet by mouth once daily 02/21/21   Troy Sine, MD  ?Cyanocobalamin (B-12) 2000 MCG TABS  05/28/20   [provider]  ?fexofenadine (ALLEGRA) 180 MG tablet Take 180 mg by mouth daily.    [provider]  ?folic acid (FOLVITE) 1 MG tablet Take 1 tablet (1 mg total) by mouth daily. 10/31/20   Troy Sine, MD  ?levothyroxine (SYNTHROID, LEVOTHROID) 112 MCG  tablet Take 112 mcg by mouth daily.    [provider]  ?losartan (COZAAR) 50 MG tablet Take 1 tablet (50 mg total) by mouth daily. 12/17/20 04/08/21  Troy Sine, MD  ?metoprolol succinate (TOPROL-XL) 50 MG 24 hr tablet Take 1 tablet (50 mg total) by mouth daily. Take with or immediately following a meal. 10/31/20   Troy Sine, MD  ?mupirocin cream (BACTROBAN) 2 % Apply 1 application topically daily as needed (after skin treatments).  01/23/16   [provider]  ?Omega-3 1400 MG CAPS Take by mouth daily.    [provider]  ?rosuvastatin (CRESTOR) 40 MG tablet Take 1 tablet by mouth every day 02/21/21   Troy Sine, MD  ?terbinafine (LAMISIL) 1 % cream Apply 1 application topically daily as needed (rash).     [provider]  ?   ? ?Allergies    ?Patient has no known allergies.   ? ?Review of Systems   ?Review of Systems  ?Constitutional:  Negative for chills and fever.  ?HENT:  Negative for ear pain and sore throat.   ?Eyes:  Negative for pain and visual disturbance.  ?Respiratory:  Negative for cough and shortness of breath.   ?Cardiovascular:  Negative for chest pain and palpitations.  ?Gastrointestinal:  Negative for abdominal pain and vomiting.  ?Genitourinary:  Negative for dysuria and  hematuria.  ?Musculoskeletal:  Negative for arthralgias and back pain.  ?Skin:  Negative for color change and rash.  ?Neurological:  Negative for seizures and syncope.  ?All other systems reviewed and are negative. ? ?Physical Exam ?Updated Vital Signs ?BP (!) 173/75   Pulse (!) 59   Temp 97.7 ?F (36.5 ?C)   Resp 15   Ht 1.651 m ('5\' 5"'$ )   Wt 78.9 kg   SpO2 97%   BMI 28.96 kg/m?  ?Physical Exam ?Vitals and nursing note reviewed.  ?Constitutional:   ?   General: He is not in acute distress. ?   Appearance: Normal appearance. He is well-developed. He is not ill-appearing.  ?HENT:  ?   Head: Normocephalic and atraumatic.  ?Eyes:  ?   Conjunctiva/sclera: Conjunctivae normal.   ?Cardiovascular:  ?   Rate and Rhythm: Normal rate and regular rhythm.  ?   Heart sounds: No murmur heard. ?Pulmonary:  ?   Effort: Pulmonary effort is normal. No respiratory distress.  ?   Breath sounds: Normal breath sounds. No wheezing.  ?Abdominal:  ?   Palpations: Abdomen is soft.  ?   Tenderness: There is no abdominal tenderness.  ?Musculoskeletal:     ?   General: No swelling.  ?   Cervical back: Neck supple.  ?   Right lower leg: No edema.  ?   Left lower leg: No edema.  ?Skin: ?   General: Skin is warm and dry.  ?   Capillary Refill: Capillary refill takes less than 2 seconds.  ?Neurological:  ?   Mental Status: He is alert.  ?Psychiatric:     ?   Mood and Affect: Mood normal.  ? ? ?ED Results / Procedures / Treatments   ?Labs ?(all labs ordered are listed, but only abnormal results are displayed) ?Labs Reviewed  ?COMPREHENSIVE METABOLIC PANEL - Abnormal; Notable for the following components:  ?    Result Value  ? Potassium 5.3 (*)   ? Glucose, Bld 122 (*)   ? BUN 35 (*)   ? Creatinine, Ser 1.78 (*)   ? GFR, Estimated 37 (*)   ? All other components within normal limits  ?CBC WITH DIFFERENTIAL/PLATELET - Abnormal; Notable for the following components:  ? RBC 3.90 (*)   ? Hemoglobin 12.8 (*)   ? HCT 38.8 (*)   ? All other components within normal limits  ? ? ?EKG ?EKG Interpretation ? ?Date/Time:  Friday May 03 2021 20:26:07 EDT ?Ventricular Rate:  55 ?PR Interval:  210 ?QRS Duration: 98 ?QT Interval:  422 ?QTC Calculation: 403 ?R Axis:   23 ?Text Interpretation: Sinus bradycardia with 1st degree A-V block Incomplete right bundle branch block Borderline ECG When compared with ECG of 18-Dec-2007 07:59, Vent. rate has decreased BY  32 BPM ST no longer elevated in Lateral leads Nonspecific T wave abnormality has replaced inverted T waves in Inferior leads Confirmed by Fredia Sorrow [phone]) on 05/03/2021 8:35:05 PM ? ?Radiology ?DG Chest Port 1 View ? ?Result Date: 05/03/2021 ?CLINICAL DATA:  Elevated  potassium. EXAM: PORTABLE CHEST 1 VIEW COMPARISON:  Two-view chest x-ray 07/28/2016 FINDINGS: Heart is enlarged. No edema or effusion is present to suggest failure. Focal airspace disease present. Remote left-sided rib fractures are present. Chronic degenerative changes are noted at the [facility] joints, right greater than left. IMPRESSION: 1. Cardiomegaly without failure. 2. No acute cardiopulmonary disease. Electronically Signed   By: San Morelle M.D.   On: 05/03/2021 21:43   ? ?Procedures ?  Procedures  ? ? ?Medications Ordered in ED ?Medications - No data to display ? ?ED Course/ Medical Decision Making/ A&P ?  ?                        ?Medical Decision Making ?Amount and/or Complexity of Data Reviewed ?Labs: ordered. ?Radiology: ordered. ? ? ?Ordered the patient Lokelma here.  But he did not want to take it.  He says his potassium is usually in this range.  In chart review shows that it has been 5.1 in the past.  Kidney function a little bit worse than baseline.  Patient will stop his oral potassium supplement.  And he will have his labs repeated either by cardiology or his primary care doctor early next week so that he does not have to have his cardiac cath canceled.  Patient's EKG cardiac monitoring without any acute findings suggestive of hyperkalemia. ? ? ?Final Clinical Impression(s) / ED Diagnoses ?Final diagnoses:  ?Hyperkalemia  ?Stage 3b chronic kidney disease ([place])  ? ? ?Rx / DC Orders ?ED Discharge Orders   ? ? None  ? ?  ? ? ?  ?Fredia Sorrow, MD ?05/03/21 2246 ? ?

## 2021-05-03 NOTE — Discharge Instructions (Addendum)
Schedule an appointment either with your primary care doctor or cardiology to have your potassium rechecked and kidney function rechecked.  Which could be problematic with them doing a cardiac cath scheduled for next week.  Since you think that you are on a potassium supplement.  I would hold taking any of that in the meantime.  Would recommend having your labs rechecked early next week sometime. ?

## 2021-05-04 ENCOUNTER — Encounter: Payer: Self-pay | Admitting: Cardiovascular Disease

## 2021-05-04 LAB — BASIC METABOLIC PANEL
BUN/Creatinine Ratio: 19 (ref 10–24)
BUN: 35 mg/dL — ABNORMAL HIGH (ref 8–27)
CO2: 23 mmol/L (ref 20–29)
Calcium: 9.1 mg/dL (ref 8.6–10.2)
Chloride: 106 mmol/L (ref 96–106)
Creatinine, Ser: 1.87 mg/dL — ABNORMAL HIGH (ref 0.76–1.27)
Glucose: 117 mg/dL — ABNORMAL HIGH (ref 70–99)
Potassium: 6 mmol/L (ref 3.5–5.2)
Sodium: 141 mmol/L (ref 134–144)
eGFR: 35 mL/min/{1.73_m2} — ABNORMAL LOW (ref >59)

## 2021-05-04 LAB — CBC
Hematocrit: 36 % — ABNORMAL LOW (ref 37.5–51.0)
Hemoglobin: 12.2 g/dL — ABNORMAL LOW (ref 13.0–17.7)
MCH: 32.6 pg (ref 26.6–33.0)
MCHC: 33.9 g/dL (ref 31.5–35.7)
MCV: 96 fL (ref 79–97)
Platelets: 186 10*3/uL (ref 150–450)
RBC: 3.74 x10E6/uL — ABNORMAL LOW (ref 4.14–5.80)
RDW: 12.2 % (ref 11.6–15.4)
WBC: 6.5 10*3/uL (ref 3.4–10.8)

## 2021-05-06 ENCOUNTER — Other Ambulatory Visit: Payer: Self-pay

## 2021-05-06 DIAGNOSIS — E875 Hyperkalemia: Secondary | ICD-10-CM

## 2021-05-07 ENCOUNTER — Other Ambulatory Visit: Payer: Self-pay

## 2021-05-09 ENCOUNTER — Other Ambulatory Visit: Payer: PPO

## 2021-05-09 DIAGNOSIS — E875 Hyperkalemia: Secondary | ICD-10-CM

## 2021-05-09 LAB — BASIC METABOLIC PANEL
BUN/Creatinine Ratio: 23 (ref 10–24)
BUN: 40 mg/dL — ABNORMAL HIGH (ref 8–27)
CO2: 23 mmol/L (ref 20–29)
Calcium: 9.3 mg/dL (ref 8.6–10.2)
Chloride: 106 mmol/L (ref 96–106)
Creatinine, Ser: 1.71 mg/dL — ABNORMAL HIGH (ref 0.76–1.27)
Glucose: 89 mg/dL (ref 70–99)
Potassium: 5.2 mmol/L (ref 3.5–5.2)
Sodium: 140 mmol/L (ref 134–144)
eGFR: 39 mL/min/{1.73_m2} — ABNORMAL LOW (ref >59)

## 2021-05-13 ENCOUNTER — Telehealth: Payer: Self-pay | Admitting: *Deleted

## 2021-05-13 NOTE — Telephone Encounter (Addendum)
Cardiac Catheterization scheduled at [facility] for: Tuesday May 14, 2021 12 Noon ?Arrival time and place: [place] Entrance A at: 7 AM-pre-procedure hydration ? ? ?No solid food after midnight prior to cath, clear liquids until 5 AM day of procedure. ? ?Medication instructions: ?-Usual morning medications can be taken with sips of water including aspirin 81 mg and Plavix 75 mg ? ?Confirmed patient has responsible adult to drive home post procedure and be with patient first 24 hours after arriving home. ? ?Patient reports no new symptoms concerning for COVID-19/no exposure to COVID-19 in the past 10 days. ? ?Reviewed procedure instructions, pre-procedure hydration with patient.  ?

## 2021-05-14 ENCOUNTER — Encounter (HOSPITAL_COMMUNITY)
Admission: RE | Disposition: A | Discharge status: Home / Self Care | Payer: PPO | Source: Home / Self Care | Attending: Cardiovascular Disease

## 2021-05-14 ENCOUNTER — Other Ambulatory Visit: Payer: Self-pay

## 2021-05-14 ENCOUNTER — Ambulatory Visit (HOSPITAL_COMMUNITY)
Admission: RE | Admit: 2021-05-14 | Discharge: 2021-05-14 | Disposition: A | Discharge status: Home / Self Care | Payer: PPO | Attending: Cardiovascular Disease | Admitting: Cardiovascular Disease

## 2021-05-14 DIAGNOSIS — I2510 Atherosclerotic heart disease of native coronary artery without angina pectoris: Secondary | ICD-10-CM

## 2021-05-14 DIAGNOSIS — Z951 Presence of aortocoronary bypass graft: Secondary | ICD-10-CM | POA: Insufficient documentation

## 2021-05-14 DIAGNOSIS — E782 Mixed hyperlipidemia: Secondary | ICD-10-CM | POA: Insufficient documentation

## 2021-05-14 DIAGNOSIS — I25810 Atherosclerosis of coronary artery bypass graft(s) without angina pectoris: Secondary | ICD-10-CM | POA: Diagnosis not present

## 2021-05-14 DIAGNOSIS — I25119 Atherosclerotic heart disease of native coronary artery with unspecified angina pectoris: Secondary | ICD-10-CM | POA: Insufficient documentation

## 2021-05-14 DIAGNOSIS — N183 Chronic kidney disease, stage 3 unspecified: Secondary | ICD-10-CM | POA: Insufficient documentation

## 2021-05-14 DIAGNOSIS — R0609 Other forms of dyspnea: Secondary | ICD-10-CM | POA: Diagnosis not present

## 2021-05-14 DIAGNOSIS — I350 Nonrheumatic aortic (valve) stenosis: Secondary | ICD-10-CM | POA: Diagnosis present

## 2021-05-14 DIAGNOSIS — I2582 Chronic total occlusion of coronary artery: Secondary | ICD-10-CM | POA: Insufficient documentation

## 2021-05-14 DIAGNOSIS — Z87891 Personal history of nicotine dependence: Secondary | ICD-10-CM | POA: Diagnosis not present

## 2021-05-14 DIAGNOSIS — I6529 Occlusion and stenosis of unspecified carotid artery: Secondary | ICD-10-CM | POA: Diagnosis not present

## 2021-05-14 DIAGNOSIS — I129 Hypertensive chronic kidney disease with stage 1 through stage 4 chronic kidney disease, or unspecified chronic kidney disease: Secondary | ICD-10-CM | POA: Insufficient documentation

## 2021-05-14 HISTORY — PX: RIGHT HEART CATH AND CORONARY/GRAFT ANGIOGRAPHY: CATH118265

## 2021-05-14 LAB — POCT I-STAT EG7
Acid-base deficit: 1 mmol/L (ref 0.0–2.0)
Bicarbonate: 24.8 mmol/L (ref 20.0–28.0)
Calcium, Ion: 1.32 mmol/L (ref 1.15–1.40)
HCT: 33 % — ABNORMAL LOW (ref 39.0–52.0)
Hemoglobin: 11.2 g/dL — ABNORMAL LOW (ref 13.0–17.0)
O2 Saturation: 68 %
Potassium: 4.5 mmol/L (ref 3.5–5.1)
Sodium: 140 mmol/L (ref 135–145)
TCO2: 26 mmol/L (ref 22–32)
pCO2, Ven: 46.8 mmHg (ref 44–60)
pH, Ven: 7.332 (ref 7.25–7.43)
pO2, Ven: 38 mmHg (ref 32–45)

## 2021-05-14 LAB — POCT I-STAT 7, (LYTES, BLD GAS, ICA,H+H)
Acid-base deficit: 3 mmol/L — ABNORMAL HIGH (ref 0.0–2.0)
Bicarbonate: 22.9 mmol/L (ref 20.0–28.0)
Calcium, Ion: 1.28 mmol/L (ref 1.15–1.40)
HCT: 32 % — ABNORMAL LOW (ref 39.0–52.0)
Hemoglobin: 10.9 g/dL — ABNORMAL LOW (ref 13.0–17.0)
O2 Saturation: 96 %
Potassium: 4.4 mmol/L (ref 3.5–5.1)
Sodium: 138 mmol/L (ref 135–145)
TCO2: 24 mmol/L (ref 22–32)
pCO2 arterial: 44.1 mmHg (ref 32–48)
pH, Arterial: 7.323 — ABNORMAL LOW (ref 7.35–7.45)
pO2, Arterial: 89 mmHg (ref 83–108)

## 2021-05-14 SURGERY — RIGHT HEART CATH AND CORONARY/GRAFT ANGIOGRAPHY
Anesthesia: LOCAL

## 2021-05-14 MED ORDER — FENTANYL CITRATE (PF) 100 MCG/2ML IJ SOLN
INTRAMUSCULAR | Status: AC
  Filled 2021-05-14: qty 2

## 2021-05-14 MED ORDER — SODIUM CHLORIDE 0.9 % WEIGHT BASED INFUSION
3.0000 mL/kg/h | INTRAVENOUS | Status: AC
  Administered 2021-05-14: 3 mL/kg/h via INTRAVENOUS

## 2021-05-14 MED ORDER — SODIUM CHLORIDE 0.9% FLUSH: 3.0000 mL | Freq: Two times a day (BID) | INTRAVENOUS | Status: DC

## 2021-05-14 MED ORDER — HEPARIN (PORCINE) IN NACL 1000-0.9 UT/500ML-% IV SOLN
INTRAVENOUS | Status: AC
  Filled 2021-05-14: qty 1000

## 2021-05-14 MED ORDER — HEPARIN (PORCINE) IN NACL 1000-0.9 UT/500ML-% IV SOLN
INTRAVENOUS | Status: DC | PRN
  Administered 2021-05-14 (×2): 500 mL

## 2021-05-14 MED ORDER — SODIUM CHLORIDE 0.9% FLUSH: 3.0000 mL | INTRAVENOUS | Status: DC | PRN

## 2021-05-14 MED ORDER — ASPIRIN 81 MG PO CHEW: 81.0000 mg | CHEWABLE_TABLET | Freq: Every day | ORAL | Status: DC

## 2021-05-14 MED ORDER — HYDRALAZINE HCL 20 MG/ML IJ SOLN: 10.0000 mg | INTRAMUSCULAR | Status: DC | PRN

## 2021-05-14 MED ORDER — SODIUM CHLORIDE 0.9 % WEIGHT BASED INFUSION
1.0000 mL/kg/h | INTRAVENOUS | Status: DC
  Administered 2021-05-14: 1 mL/kg/h via INTRAVENOUS

## 2021-05-14 MED ORDER — LABETALOL HCL 5 MG/ML IV SOLN: 10.0000 mg | INTRAVENOUS | Status: DC | PRN

## 2021-05-14 MED ORDER — CLOPIDOGREL BISULFATE 75 MG PO TABS: 75.0000 mg | ORAL_TABLET | Freq: Every day | ORAL | Status: DC

## 2021-05-14 MED ORDER — SODIUM CHLORIDE 0.9 % IV SOLN: 250.0000 mL | INTRAVENOUS | Status: DC | PRN

## 2021-05-14 MED ORDER — ASPIRIN 81 MG PO CHEW: 81.0000 mg | CHEWABLE_TABLET | ORAL | Status: DC

## 2021-05-14 MED ORDER — CLOPIDOGREL BISULFATE 75 MG PO TABS: 75.0000 mg | ORAL_TABLET | ORAL | Status: DC

## 2021-05-14 MED ORDER — AMLODIPINE BESYLATE 5 MG PO TABS
5.0000 mg | ORAL_TABLET | Freq: Once | ORAL | Status: AC
  Administered 2021-05-14: 5 mg via ORAL
  Filled 2021-05-14: qty 1

## 2021-05-14 MED ORDER — LIDOCAINE HCL (PF) 1 % IJ SOLN
INTRAMUSCULAR | Status: DC | PRN
Start: 2021-05-14 — End: 2021-05-14
  Administered 2021-05-14: 5 mL

## 2021-05-14 MED ORDER — ACETAMINOPHEN 325 MG PO TABS: 650.0000 mg | ORAL_TABLET | ORAL | Status: DC | PRN

## 2021-05-14 MED ORDER — SODIUM CHLORIDE 0.9 % IV SOLN: INTRAVENOUS | Status: DC

## 2021-05-14 MED ORDER — MIDAZOLAM HCL 2 MG/2ML IJ SOLN
INTRAMUSCULAR | Status: DC | PRN
  Administered 2021-05-14: 1 mg via INTRAVENOUS

## 2021-05-14 MED ORDER — IOHEXOL 350 MG/ML SOLN
INTRAVENOUS | Status: DC | PRN
  Administered 2021-05-14: 120 mL

## 2021-05-14 MED ORDER — ONDANSETRON HCL 4 MG/2ML IJ SOLN: 4.0000 mg | Freq: Four times a day (QID) | INTRAMUSCULAR | Status: DC | PRN

## 2021-05-14 MED ORDER — FENTANYL CITRATE (PF) 100 MCG/2ML IJ SOLN
INTRAMUSCULAR | Status: DC | PRN
  Administered 2021-05-14: 25 ug via INTRAVENOUS

## 2021-05-14 MED ORDER — MIDAZOLAM HCL 2 MG/2ML IJ SOLN
INTRAMUSCULAR | Status: AC
  Filled 2021-05-14: qty 2

## 2021-05-14 MED ORDER — LIDOCAINE HCL (PF) 1 % IJ SOLN
INTRAMUSCULAR | Status: AC
Start: 2021-05-14 — End: ?
  Filled 2021-05-14: qty 30

## 2021-05-14 SURGICAL SUPPLY — 17 items
CATH INFINITI 5 FR IM (CATHETERS) ×1 IMPLANT
CATH INFINITI 5 FR RCB (CATHETERS) ×1 IMPLANT
CATH INFINITI 5FR MULTPACK ANG (CATHETERS) ×1 IMPLANT
CATH SWAN GANZ 7F STRAIGHT (CATHETERS) ×1 IMPLANT
CLOSURE MYNX CONTROL 5F (Vascular Products) ×1 IMPLANT
ELECT DEFIB PAD ADLT CADENCE (PAD) ×1 IMPLANT
KIT HEART LEFT (KITS) ×2 IMPLANT
PACK CARDIAC CATHETERIZATION (CUSTOM PROCEDURE TRAY) ×2 IMPLANT
SHEATH PINNACLE 5F 10CM (SHEATH) ×1 IMPLANT
SHEATH PINNACLE 7F 10CM (SHEATH) ×1 IMPLANT
SHEATH PROBE COVER 6X72 (BAG) ×1 IMPLANT
SYR MEDRAD MARK 7 150ML (SYRINGE) ×2 IMPLANT
TRANSDUCER W/STOPCOCK (MISCELLANEOUS) ×2 IMPLANT
TUBING CIL FLEX 10 FLL-RA (TUBING) ×2 IMPLANT
WIRE EMERALD 3MM-J .025X260CM (WIRE) ×1 IMPLANT
WIRE EMERALD 3MM-J .035X150CM (WIRE) ×1 IMPLANT
WIRE EMERALD 3MM-J .035X260CM (WIRE) ×1 IMPLANT

## 2021-05-14 NOTE — Interval H&P Note (Signed)
Cath Lab Visit (complete for each Cath Lab visit) ? ?Clinical Evaluation Leading to the Procedure:  ? ?ACS: No. ? ?Non-ACS:   ? ?Anginal Classification: CCS II ? ?Anti-ischemic medical therapy: Maximal Therapy (2 or more classes of medications) ? ?Non-Invasive Test Results: No non-invasive testing performed ? ?Prior CABG: Previous CABG ? ? ? ? ? ?History and Physical Interval Note: ? ?05/14/2021 ?1:40 PM ? ?Dillon [name]  has presented today for surgery, with the diagnosis of aortic stenosis.  The various methods of treatment have been discussed with the patient and family. After consideration of risks, benefits and other options for treatment, the patient has consented to  Procedure(s): ?RIGHT/LEFT HEART CATH AND CORONARY/GRAFT ANGIOGRAPHY (N/A) as a surgical intervention.  The patient's history has been reviewed, patient examined, no change in status, stable for surgery.  I have reviewed the patient's chart and labs.  Questions were answered to the patient's satisfaction.   ? ? ?Shelva Majestic ? ? ?

## 2021-05-15 ENCOUNTER — Encounter (HOSPITAL_COMMUNITY): Payer: Self-pay | Admitting: Cardiovascular Disease

## 2021-05-15 LAB — POCT I-STAT EG7
Acid-base deficit: 2 mmol/L (ref 0.0–2.0)
Bicarbonate: 23.9 mmol/L (ref 20.0–28.0)
Calcium, Ion: 1.3 mmol/L (ref 1.15–1.40)
HCT: 34 % — ABNORMAL LOW (ref 39.0–52.0)
Hemoglobin: 11.6 g/dL — ABNORMAL LOW (ref 13.0–17.0)
O2 Saturation: 67 %
Potassium: 4.5 mmol/L (ref 3.5–5.1)
Sodium: 140 mmol/L (ref 135–145)
TCO2: 25 mmol/L (ref 22–32)
pCO2, Ven: 47.1 mmHg (ref 44–60)
pH, Ven: 7.314 (ref 7.25–7.43)
pO2, Ven: 38 mmHg (ref 32–45)

## 2021-06-04 ENCOUNTER — Telehealth: Payer: Self-pay | Admitting: *Deleted

## 2021-06-04 NOTE — Telephone Encounter (Addendum)
Coronary Stent scheduled at [facility] for: Wednesday Jun 05, 2021 11:30 AM ?Arrival time and place: [place] Entrance A at: 6:30 AM-pre-procedure hydration ? ? ?Nothing to eat after midnight prior to cath, clear liquids until 5 AM day of procedure. ? ?Medication instructions: ?-Usual morning medications can be taken with sips of water including aspirin 81 mg and Plavix 75 mg ? ?Confirmed patient has responsible adult to drive home post procedure and be with patient first 24 hours after arriving home. ? ?Patient reports no new symptoms concerning for COVID-19/no exposure to COVID-19 in the past 10 days. ? ?Reviewed procedure instructions with patient.  ?

## 2021-06-05 ENCOUNTER — Other Ambulatory Visit: Payer: Self-pay | Admitting: Physician Assistant

## 2021-06-05 ENCOUNTER — Encounter (HOSPITAL_COMMUNITY)
Admission: RE | Disposition: A | Discharge status: Home / Self Care | Payer: Self-pay | Source: Home / Self Care | Attending: Cardiovascular Disease

## 2021-06-05 ENCOUNTER — Other Ambulatory Visit: Payer: Self-pay

## 2021-06-05 ENCOUNTER — Encounter (HOSPITAL_COMMUNITY): Payer: Self-pay | Admitting: Cardiovascular Disease

## 2021-06-05 ENCOUNTER — Ambulatory Visit (HOSPITAL_COMMUNITY)
Admission: RE | Admit: 2021-06-05 | Discharge: 2021-06-06 | Disposition: A | Discharge status: Home / Self Care | Payer: PPO | Attending: Cardiovascular Disease | Admitting: Cardiovascular Disease

## 2021-06-05 DIAGNOSIS — I350 Nonrheumatic aortic (valve) stenosis: Secondary | ICD-10-CM

## 2021-06-05 DIAGNOSIS — Z951 Presence of aortocoronary bypass graft: Secondary | ICD-10-CM | POA: Diagnosis not present

## 2021-06-05 DIAGNOSIS — I129 Hypertensive chronic kidney disease with stage 1 through stage 4 chronic kidney disease, or unspecified chronic kidney disease: Secondary | ICD-10-CM | POA: Insufficient documentation

## 2021-06-05 DIAGNOSIS — N1832 Chronic kidney disease, stage 3b: Secondary | ICD-10-CM | POA: Diagnosis not present

## 2021-06-05 DIAGNOSIS — I6523 Occlusion and stenosis of bilateral carotid arteries: Secondary | ICD-10-CM | POA: Insufficient documentation

## 2021-06-05 DIAGNOSIS — I440 Atrioventricular block, first degree: Secondary | ICD-10-CM | POA: Insufficient documentation

## 2021-06-05 DIAGNOSIS — I779 Disorder of arteries and arterioles, unspecified: Secondary | ICD-10-CM | POA: Diagnosis present

## 2021-06-05 DIAGNOSIS — I209 Angina pectoris, unspecified: Secondary | ICD-10-CM | POA: Diagnosis present

## 2021-06-05 DIAGNOSIS — Z87891 Personal history of nicotine dependence: Secondary | ICD-10-CM | POA: Insufficient documentation

## 2021-06-05 DIAGNOSIS — E782 Mixed hyperlipidemia: Secondary | ICD-10-CM | POA: Diagnosis present

## 2021-06-05 DIAGNOSIS — I2510 Atherosclerotic heart disease of native coronary artery without angina pectoris: Secondary | ICD-10-CM | POA: Diagnosis present

## 2021-06-05 DIAGNOSIS — Z955 Presence of coronary angioplasty implant and graft: Secondary | ICD-10-CM | POA: Diagnosis not present

## 2021-06-05 DIAGNOSIS — I25119 Atherosclerotic heart disease of native coronary artery with unspecified angina pectoris: Secondary | ICD-10-CM | POA: Diagnosis not present

## 2021-06-05 DIAGNOSIS — I10 Essential (primary) hypertension: Secondary | ICD-10-CM | POA: Diagnosis present

## 2021-06-05 HISTORY — PX: CORONARY STENT INTERVENTION: CATH118234

## 2021-06-05 LAB — BASIC METABOLIC PANEL
Anion gap: 7 (ref 5–15)
BUN: 26 mg/dL — ABNORMAL HIGH (ref 8–23)
CO2: 25 mmol/L (ref 22–32)
Calcium: 9.5 mg/dL (ref 8.9–10.3)
Chloride: 107 mmol/L (ref 98–111)
Creatinine, Ser: 1.85 mg/dL — ABNORMAL HIGH (ref 0.61–1.24)
GFR, Estimated: 35 mL/min — ABNORMAL LOW (ref >60)
Glucose, Bld: 125 mg/dL — ABNORMAL HIGH (ref 70–99)
Potassium: 4.9 mmol/L (ref 3.5–5.1)
Sodium: 139 mmol/L (ref 135–145)

## 2021-06-05 LAB — CBC
HCT: 35.8 % — ABNORMAL LOW (ref 39.0–52.0)
Hemoglobin: 12.1 g/dL — ABNORMAL LOW (ref 13.0–17.0)
MCH: 33.7 pg (ref 26.0–34.0)
MCHC: 33.8 g/dL (ref 30.0–36.0)
MCV: 99.7 fL (ref 80.0–100.0)
Platelets: 184 10*3/uL (ref 150–400)
RBC: 3.59 MIL/uL — ABNORMAL LOW (ref 4.22–5.81)
RDW: 12.8 % (ref 11.5–15.5)
WBC: 7.2 10*3/uL (ref 4.0–10.5)
nRBC: 0 % (ref 0.0–0.2)

## 2021-06-05 LAB — POCT ACTIVATED CLOTTING TIME
Activated Clotting Time: 245 seconds
Activated Clotting Time: 311 seconds

## 2021-06-05 SURGERY — CORONARY STENT INTERVENTION
Anesthesia: LOCAL

## 2021-06-05 MED ORDER — CLOPIDOGREL BISULFATE 75 MG PO TABS: 75.0000 mg | ORAL_TABLET | ORAL | Status: DC

## 2021-06-05 MED ORDER — MIDAZOLAM HCL 2 MG/2ML IJ SOLN
INTRAMUSCULAR | Status: AC
  Filled 2021-06-05: qty 2

## 2021-06-05 MED ORDER — HEPARIN SODIUM (PORCINE) 1000 UNIT/ML IJ SOLN
INTRAMUSCULAR | Status: AC
  Filled 2021-06-05: qty 10

## 2021-06-05 MED ORDER — LIDOCAINE HCL (PF) 1 % IJ SOLN
INTRAMUSCULAR | Status: DC | PRN
  Administered 2021-06-05: 10 mL
  Administered 2021-06-05: 5 mL

## 2021-06-05 MED ORDER — ROSUVASTATIN CALCIUM 20 MG PO TABS
40.0000 mg | ORAL_TABLET | Freq: Every day | ORAL | Status: DC
  Administered 2021-06-05 – 2021-06-06 (×2): 40 mg via ORAL
  Filled 2021-06-05 (×2): qty 2

## 2021-06-05 MED ORDER — SODIUM CHLORIDE 0.9 % WEIGHT BASED INFUSION
1.0000 mL/kg/h | INTRAVENOUS | Status: AC
  Administered 2021-06-05 (×2): 1 mL/kg/h via INTRAVENOUS

## 2021-06-05 MED ORDER — IOHEXOL 350 MG/ML SOLN
INTRAVENOUS | Status: DC | PRN
  Administered 2021-06-05: 40 mL

## 2021-06-05 MED ORDER — NITROGLYCERIN 1 MG/10 ML FOR IR/CATH LAB
INTRA_ARTERIAL | Status: DC | PRN
  Administered 2021-06-05: 100 ug via INTRACORONARY

## 2021-06-05 MED ORDER — SODIUM CHLORIDE 0.9% FLUSH
3.0000 mL | Freq: Two times a day (BID) | INTRAVENOUS | Status: DC
  Administered 2021-06-06: 3 mL via INTRAVENOUS

## 2021-06-05 MED ORDER — LIDOCAINE HCL (PF) 1 % IJ SOLN
INTRAMUSCULAR | Status: AC
  Filled 2021-06-05: qty 30

## 2021-06-05 MED ORDER — HYDRALAZINE HCL 20 MG/ML IJ SOLN: 10.0000 mg | INTRAMUSCULAR | Status: AC | PRN

## 2021-06-05 MED ORDER — VERAPAMIL HCL 2.5 MG/ML IV SOLN
INTRAVENOUS | Status: DC | PRN
  Administered 2021-06-05: 10 mL via INTRA_ARTERIAL

## 2021-06-05 MED ORDER — SODIUM CHLORIDE 0.9% FLUSH: 3.0000 mL | INTRAVENOUS | Status: DC | PRN

## 2021-06-05 MED ORDER — SODIUM CHLORIDE 0.9% FLUSH
3.0000 mL | Freq: Two times a day (BID) | INTRAVENOUS | Status: DC
  Administered 2021-06-05 – 2021-06-06 (×3): 3 mL via INTRAVENOUS

## 2021-06-05 MED ORDER — AMLODIPINE BESYLATE 5 MG PO TABS
5.0000 mg | ORAL_TABLET | Freq: Every day | ORAL | Status: DC
  Administered 2021-06-05 – 2021-06-06 (×2): 5 mg via ORAL
  Filled 2021-06-05 (×2): qty 1

## 2021-06-05 MED ORDER — HEPARIN (PORCINE) IN NACL 1000-0.9 UT/500ML-% IV SOLN
INTRAVENOUS | Status: DC | PRN
  Administered 2021-06-05 (×2): 500 mL

## 2021-06-05 MED ORDER — HEPARIN (PORCINE) IN NACL 1000-0.9 UT/500ML-% IV SOLN
INTRAVENOUS | Status: AC
  Filled 2021-06-05: qty 1000

## 2021-06-05 MED ORDER — ASPIRIN 81 MG PO CHEW: 81.0000 mg | CHEWABLE_TABLET | ORAL | Status: DC

## 2021-06-05 MED ORDER — FENTANYL CITRATE (PF) 100 MCG/2ML IJ SOLN
INTRAMUSCULAR | Status: DC | PRN
  Administered 2021-06-05 (×3): 25 ug via INTRAVENOUS

## 2021-06-05 MED ORDER — SODIUM CHLORIDE 0.9 % WEIGHT BASED INFUSION
3.0000 mL/kg/h | INTRAVENOUS | Status: DC
  Administered 2021-06-05: 3 mL/kg/h via INTRAVENOUS

## 2021-06-05 MED ORDER — LABETALOL HCL 5 MG/ML IV SOLN: 10.0000 mg | INTRAVENOUS | Status: AC | PRN

## 2021-06-05 MED ORDER — HEPARIN SODIUM (PORCINE) 1000 UNIT/ML IJ SOLN
INTRAMUSCULAR | Status: DC | PRN
Start: 2021-06-05 — End: 2021-06-05
  Administered 2021-06-05: 8000 [IU] via INTRAVENOUS

## 2021-06-05 MED ORDER — SODIUM CHLORIDE 0.9 % IV SOLN: 250.0000 mL | INTRAVENOUS | Status: DC | PRN

## 2021-06-05 MED ORDER — NITROGLYCERIN 1 MG/10 ML FOR IR/CATH LAB
INTRA_ARTERIAL | Status: AC
  Filled 2021-06-05: qty 10

## 2021-06-05 MED ORDER — SODIUM CHLORIDE 0.9 % WEIGHT BASED INFUSION: 1.0000 mL/kg/h | INTRAVENOUS | Status: DC

## 2021-06-05 MED ORDER — LORATADINE 10 MG PO TABS
10.0000 mg | ORAL_TABLET | Freq: Every day | ORAL | Status: DC
  Administered 2021-06-06: 10 mg via ORAL
  Filled 2021-06-05: qty 1

## 2021-06-05 MED ORDER — VERAPAMIL HCL 2.5 MG/ML IV SOLN
INTRAVENOUS | Status: AC
  Filled 2021-06-05: qty 2

## 2021-06-05 MED ORDER — ASPIRIN EC 81 MG PO TBEC
81.0000 mg | DELAYED_RELEASE_TABLET | Freq: Every day | ORAL | Status: DC
Start: 2021-06-05 — End: 2021-06-06
  Administered 2021-06-05: 81 mg via ORAL
  Filled 2021-06-05: qty 1

## 2021-06-05 MED ORDER — FENTANYL CITRATE (PF) 100 MCG/2ML IJ SOLN
INTRAMUSCULAR | Status: AC
  Filled 2021-06-05: qty 2

## 2021-06-05 MED ORDER — MIDAZOLAM HCL 2 MG/2ML IJ SOLN
INTRAMUSCULAR | Status: DC | PRN
  Administered 2021-06-05 (×3): 1 mg via INTRAVENOUS

## 2021-06-05 MED ORDER — ACETAMINOPHEN 325 MG PO TABS: 650.0000 mg | ORAL_TABLET | ORAL | Status: DC | PRN

## 2021-06-05 MED ORDER — METOPROLOL SUCCINATE ER 50 MG PO TB24
50.0000 mg | ORAL_TABLET | Freq: Every day | ORAL | Status: DC
  Administered 2021-06-05 – 2021-06-06 (×2): 50 mg via ORAL
  Filled 2021-06-05 (×2): qty 1

## 2021-06-05 MED ORDER — ONDANSETRON HCL 4 MG/2ML IJ SOLN: 4.0000 mg | Freq: Four times a day (QID) | INTRAMUSCULAR | Status: DC | PRN

## 2021-06-05 MED ORDER — CLOPIDOGREL BISULFATE 75 MG PO TABS
75.0000 mg | ORAL_TABLET | Freq: Every day | ORAL | Status: DC
  Administered 2021-06-06: 75 mg via ORAL
  Filled 2021-06-05: qty 1

## 2021-06-05 MED ORDER — LEVOTHYROXINE SODIUM 112 MCG PO TABS
112.0000 ug | ORAL_TABLET | Freq: Every day | ORAL | Status: DC
  Administered 2021-06-06: 112 ug via ORAL
  Filled 2021-06-05: qty 1

## 2021-06-05 SURGICAL SUPPLY — 28 items
BALL SAPPHIRE NC24 4.5X10 (BALLOONS) ×2
BALLN EUPHORA RX 4.0X12 (BALLOONS) ×2
BALLOON EUPHORA RX 4.0X12 (BALLOONS) IMPLANT
BALLOON SAPPHIRE NC24 4.5X10 (BALLOONS) IMPLANT
CATH INFINITI JR4 5F (CATHETERS) ×1 IMPLANT
CATH OPTICROSS HD (CATHETERS) ×1 IMPLANT
CATH VISTA GUIDE 6FR XBLAD3.5 (CATHETERS) ×1 IMPLANT
CLOSURE PERCLOSE PROSTYLE (VASCULAR PRODUCTS) ×1 IMPLANT
DEVICE RAD COMP TR BAND LRG (VASCULAR PRODUCTS) ×2 IMPLANT
ELECT DEFIB PAD ADLT CADENCE (PAD) ×1 IMPLANT
GLIDESHEATH SLEND SS 6F .021 (SHEATH) ×1 IMPLANT
GUIDEWIRE ANGLED .035X150CM (WIRE) ×1 IMPLANT
GUIDEWIRE INQWIRE 1.5J.035X260 (WIRE) IMPLANT
INQWIRE 1.5J .035X260CM (WIRE) ×2
KIT ENCORE 26 ADVANTAGE (KITS) ×1 IMPLANT
KIT HEART LEFT (KITS) ×2 IMPLANT
KIT HEMO VALVE WATCHDOG (MISCELLANEOUS) ×1 IMPLANT
KIT MICROPUNCTURE NIT STIFF (SHEATH) ×1 IMPLANT
PACK CARDIAC CATHETERIZATION (CUSTOM PROCEDURE TRAY) ×2 IMPLANT
SHEATH PINNACLE 6F 10CM (SHEATH) ×1 IMPLANT
SHEATH PROBE COVER 6X72 (BAG) ×1 IMPLANT
SLED PULL BACK IVUS (MISCELLANEOUS) ×1 IMPLANT
STENT SYNERGY XD 4.50X12 (Permanent Stent) IMPLANT
SYNERGY XD 4.50X12 (Permanent Stent) ×2 IMPLANT
TRANSDUCER W/STOPCOCK (MISCELLANEOUS) ×2 IMPLANT
TUBING CIL FLEX 10 FLL-RA (TUBING) ×2 IMPLANT
WIRE COUGAR XT STRL 190CM (WIRE) ×1 IMPLANT
WIRE EMERALD 3MM-J .035X150CM (WIRE) ×1 IMPLANT

## 2021-06-05 NOTE — Progress Notes (Signed)
Pt received from cath lab AxOx4, VW wnL and as per flow. Right groin with silver dollar size blood tinged dressing, no hematoma or firmness. (R) TR band C/D/I level 0. Pt reeducated to bed rest and limb restrictions. Wife bedside. Call bell placed within reach. Will continue to monitor and maintain safety. ?

## 2021-06-05 NOTE — Discharge Instructions (Signed)

## 2021-06-05 NOTE — Discharge Summary (Addendum)
?[facility]   ?MULTIDISCIPLINARY HEART VALVE TEAM ? ?Discharge Summary  ?  ?Patient ID: Dillon [name] ?MRN: [id number]; DOB: [birth year] ? ?Admit date: 06/05/2021 ?Discharge date: 06/06/2021 ? ?Primary Care Provider: Jenel Lucks, PA-C  ?Primary Cardiologist: Shelva Majestic, MD  ? ?Discharge Diagnoses  ?  ?Principal Problem: ?  Angina pectoris ([place]) ?Active Problems: ?  CAD (coronary artery disease) ?  Hyperlipidemia, mixed ?  Carotid disease, bilateral ([place]) ?  HTN (hypertension) ?  Severe aortic stenosis ?  Stage 3b chronic kidney disease ([place]) ?  1st degree AV block ? ? ?Allergies ?No Known Allergies ? ?Diagnostic Studies/Procedures  ?  ?Coronary stent intervention 06/05/21 ? ?Conclusion ? ?Successful IVUS guided PCI of severe 90% stenosis at the ostium of the left mainstem, treated with a 4.5 x 12 mm Synergy DES. ?  ?Recommend: Overnight observation and hydration, repeat metabolic panel tomorrow morning, continue with plans for TAVR for treatment of severe symptomatic aortic stenosis. ?  ?_____________ ?  ? ?History of Present Illness   ?  ?Dillon [name] is a 88 y.o. male with a history of CAD s/p CABG x3V (2009 by [facility]), CKD stage IIIb, carotid stenosis, hypertension, aortic stenosis and mixed hyperlipidemia who presented to [facility] on 06/05/21 for planned PCI.  ? ?His cardiac history dates back to 2009 when he was having exertional angina and had a high risk nuclear stress test.  He was admitted for cardiac catheterization which demonstrated critical left main and multivessel disease and the patient was taken emergently for coronary bypass surgery by Dr. Cyndia Bent.  He was treated with a LIMA to LAD, saphenous vein graft to OM, and saphenous vein graft to RCA. The patient has done well since that time. ? ?The patient has been noted to have aortic stenosis and has been followed now for many years.  In 2020 and 2021 he had findings consistent with moderate aortic stenosis.  However, in spring 2022 the  patient's echocardiogram demonstrated progressive and now severe aortic stenosis with a mean transvalvular gradient greater than 40 mmHg.  A follow-up echocardiogram in August 2022 showed stable findings with a mean gradient of 47 mmHg.  His recent echo continues to show normal LV function with mild LVH.  Aortic gradients have continued to worsen now with a peak gradient of 83 mmHg and a mean gradient of 53 mmHg.  The patient reports mild fatigue and mild exertional dyspnea with certain physical activities such as working in his yard. ? ?He was seen by Dr Burt Knack in the office on 04/25/21 to discuss starting TAVR work up. He underwent L/RHC on 05/14/21 which showed severe native CAD with 95% eccentric ostial left main stenosis; 40% first diagonal stenosis of the LAD; 50% proximal circumflex stenosis; and ostial occlusion of the RCA. There was an atretic very small caliber LIMA graft supplying the mid distal LAD, patent vein graft supplying the left circumflex vessel with 40 - 50% proximal third stenosis and patent vein graft supplying the RCA with filling of the RCA retrograde to the ostium.  There is a valve in the body of the graft with narrowing of 65 proximal and 50% distal to the probable valve. Plan was to consider TAVR with left main PCI, which was set up for 06/05/21 with Dr. Burt Knack.  ? ?Hospital Course  ?   ?Consultants: none  ? ?CAD: he is s/p successful IVUS guided PCI of severe 90% stenosis at the ostium of the left mainstem, treated with a 4.5 x  12 mm Synergy DES.  ECG stable this AM. Continue on Aspirin and Plavix. Continue statin and BB.  ? ?CKD stage IIIb: he was kept overnight for hydration. Creat improved from 1.85--> 1.45. Will recheck kidney function at follow up. He is on no nephrotoxic medications. ? ?Severe aortic stenosis: set up for CT scans on 5/30 with hydration. Instructions will be given to him at follow up.  ? ?HTN: BP elevated this morning. Resume home meds.  ? ?HLD: last lipid panel over  a year ago but LDL 59. LPA ordered but lab still pending.  ?_____________ ? ?Discharge Vitals ?Blood pressure (!) 161/51, pulse (!) 55, temperature 98 ?F (36.7 ?C), temperature source Oral, resp. rate 17, height '5\' 5"'$  (1.651 m), weight 78 kg, SpO2 97 %.  Danley Danker Weights  ? 06/05/21 0175  ?Weight: 78 kg  ? ? ?GEN: Well nourished, well developed, in no acute distress ?HEENT: normal ?Neck: no JVD or masses ?Cardiac: RRR; harsh 3/6 SEM @ RUSB. No rubs, or gallops,no edema  ?Respiratory:  clear to auscultation bilaterally, normal work of breathing ?GI: soft, nontender, nondistended, + BS ?MS: no deformity or atrophy ?Skin: warm and dry, no rash. Wrist and groin site are stable ?Neuro:  Alert and Oriented x 3, Strength and sensation are intact ?Psych: euthymic mood, full affect ? ? ?Labs & Radiologic Studies  ?  ?CBC ?Recent Labs  ?  06/05/21 ?0751 06/06/21 ?0236  ?WBC 7.2 10.1  ?HGB 12.1* 11.2*  ?HCT 35.8* 34.6*  ?MCV 99.7 100.6*  ?PLT 184 217  ? ?Basic Metabolic Panel ?Recent Labs  ?  06/05/21 ?0751 06/06/21 ?0236  ?NA 139 137  ?K 4.9 4.8  ?CL 107 111  ?CO2 25 21*  ?GLUCOSE 125* 84  ?BUN 26* 21  ?CREATININE 1.85* 1.48*  ?CALCIUM 9.5 8.8*  ? ?Liver Function Tests ?No results for input(s): AST, ALT, ALKPHOS, BILITOT, PROT, ALBUMIN in the last 72 hours. ?No results for input(s): LIPASE, AMYLASE in the last 72 hours. ?Cardiac Enzymes ?No results for input(s): CKTOTAL, CKMB, CKMBINDEX, TROPONINI in the last 72 hours. ?BNP ?Invalid input(s): POCBNP ?D-Dimer ?No results for input(s): DDIMER in the last 72 hours. ?Hemoglobin A1C ?No results for input(s): HGBA1C in the last 72 hours. ?Fasting Lipid Panel ?No results for input(s): CHOL, HDL, LDLCALC, TRIG, CHOLHDL, LDLDIRECT in the last 72 hours. ?Thyroid Function Tests ?No results for input(s): TSH, T4TOTAL, T3FREE, THYROIDAB in the last 72 hours. ? ?Invalid input(s): FREET3 ?_____________  ?CARDIAC CATHETERIZATION ? ?Result Date: 06/05/2021 ?Successful IVUS guided PCI of severe  90% stenosis at the ostium of the left mainstem, treated with a 4.5 x 12 mm Synergy DES. Recommend: Overnight observation and hydration, repeat metabolic panel tomorrow morning, continue with plans for TAVR for treatment of severe symptomatic aortic stenosis.  ? ?CARDIAC CATHETERIZATION ? ?Result Date: 05/14/2021 ?  Ost LM lesion is 95% stenosed.   Ost RCA lesion is 100% stenosed.   Prox Graft-1 lesion is 65% stenosed.   Prox Graft-2 lesion is 50% stenosed.   Prox Graft lesion is 45% stenosed.   Ost Cx to Prox Cx lesion is 50% stenosed.   and is small. Very mildly elevated right heart pressures. Severely calcified aortic valve with echocardiographic documentation of a mean gradient of 53 mmHg, and peak gradient 83 mmHg.  Severe native CAD with 95% eccentric ostial left main stenosis; 40% first diagonal stenosis of the LAD; 50% proximal circumflex stenosis; and ostial occlusion of the RCA. Atretic very small caliber LIMA graft  supplying the mid distal LAD. Patent vein graft supplying the left circumflex vessel with 40 - 50% proximal third stenosis. Patent vein graft supplying the RCA with filling of the RCA retrograde to the ostium.  There is a valve in the body of the graft with narrowing of 65 proximal and 50% distal to the probable valve. RECOMMENDATION: The patient has a scheduled appointment to see Dr. Gilford Raid as part of the structural heart team evaluation.  He  will undergo follow-up CT imaging per TAVR protocol.  Ultimate decision regarding SAVR with revascularization versus TAVR and left main PCI per Drs. Bartle and [place].   ? ?Disposition  ? ?Pt is being discharged home today in good condition. ? ?Follow-up Plans & Appointments  ? ? ? Follow-up Information   ? ? Eileen Stanford, PA-C. Go on 06/20/2021.   ?Specialties: Cardiology, Radiology ?Why: @ 2:20pm, please arrive at least 10 minutes early ?Contact information: ?[address] ?[address] ?[place] [address] ?[phone] ? ? ?  ?  ? ?  ?   ? ?  ? ? ? ?Discharge Medications  ? ?Allergies as of 06/06/2021   ?No Known Allergies ?  ? ?  ?Medication List  ?  ? ?TAKE these medications   ? ?amLODipine 5 MG tablet ?Commonly known as: NORVASC ?Take

## 2021-06-05 NOTE — H&P (Signed)
?Cardiology Office Note:   ?  ?Date:  04/25/2021  ?  ?ID:  Dillon [name], DOB 1934-01-16, MRN [id number] ?  ?PCP:  Drosinis, Pamalee Leyden, PA-C ?             ?[facility] Providers ?Cardiologist:  Shelva Majestic, MD    ?  ?Referring MD: Drosinis, Pamalee Leyden, PA-C  ?  ?   ?Chief Complaint  ?Patient presents with  ? Shortness of Breath  ?  ?  ?  ?History of Present Illness:   ?  ?Dillon [name] is a 88 y.o. male with a hx of aortic stenosis, presenting for follow-up evaluation.  The patient was initially seen for structural heart consultation in October 2022.  His cardiac history dates back to 2009 when he was having exertional angina and had a high risk nuclear stress test.  He was admitted for cardiac catheterization which demonstrated critical left main and multivessel disease and the patient was taken emergently for coronary bypass surgery by Dr. Cyndia Bent.  He was treated with a LIMA to LAD, saphenous vein graft to OM, and saphenous vein graft to RCA.  The patient has done well since that time.  He has been followed for carotid stenosis, hypertension, and mixed hyperlipidemia.  The patient has been noted to have aortic stenosis and has been followed now for many years.  In 2020 and 2021 he had findings consistent with moderate aortic stenosis.  However, in spring 2022 the patient's echocardiogram demonstrated progressive and now severe aortic stenosis with a mean transvalvular gradient greater than 40 mmHg.  A follow-up echocardiogram in August 2022 showed stable findings with a mean gradient of 47 mmHg.  His recent echo continues to show normal LV function with mild LVH.  Aortic gradients have continued to worsen now with a peak gradient of 83 mmHg and a mean gradient of 53 mmHg.  The patient reports mild fatigue and mild exertional dyspnea with certain physical activities such as working in his yard.  He really minimizes his symptoms and states that he is not bothered much, but does note that it may take him longer to do the  same activities that he was able to do in the past.  He denies lightheadedness, presyncope, chest pain, chest pressure, orthopnea, PND, or leg swelling. ?  ?    ?Past Medical History:  ?Diagnosis Date  ? CAD (coronary artery disease)    ? Heart murmur    ? History of hiatal hernia    ? Hypertension    ? Hypothyroidism    ? Pneumonia 12/2015  ?  hx  ? S/P CABG x 3 12/17/07  ?  LIMA to LAD,SVG to left C  ?  ?  ?     ?Past Surgical History:  ?Procedure Laterality Date  ? CORONARY ARTERY BYPASS GRAFT   12/17/07  ?  LIMA to LAD,vein to obtuse marginal,vein to RCA  ? EMPYEMA DRAINAGE Left 01/11/2016  ?  Procedure: EMPYEMA DRAINAGE;  Surgeon: Ivin Poot, MD;  Location: [place];  Service: Thoracic;  Laterality: Left;  ? ENDARTERECTOMY Right 03/26/2016  ?  Procedure: RIGHT CAROTID ENDARTERECTOMY;  Surgeon: Serafina Mitchell, MD;  Location: [place];  Service: Vascular;  Laterality: Right;  ? PATCH ANGIOPLASTY Right 03/26/2016  ?  Procedure: PATCH ANGIOPLASTY USING Rueben Bash BIOLOGIC PATCH;  Surgeon: Serafina Mitchell, MD;  Location: [place];  Service: Vascular;  Laterality: Right;  ? VIDEO ASSISTED THORACOSCOPY (VATS)/DECORTICATION Left 01/11/2016  ?  Procedure: VIDEO ASSISTED  THORACOSCOPY (VATS)/DECORTICATION;  Surgeon: Ivin Poot, MD;  Location: [place];  Service: Thoracic;  Laterality: Left;  ?  ?  ?Current Medications: ?Active Medications  ?    ?Current Meds  ?Medication Sig  ? amLODipine (NORVASC) 5 MG tablet Take 1 tablet by mouth daily  ? aspirin EC 81 MG tablet Take 81 mg by mouth at bedtime.   ? Cholecalciferol (VITAMIN D) 2000 units tablet Take 2,000 Units by mouth at bedtime.  ? clopidogrel (PLAVIX) 75 MG tablet Take 1 tablet by mouth once daily  ? Cyanocobalamin (B-12) 2000 MCG TABS    ? fexofenadine (ALLEGRA) 180 MG tablet Take 180 mg by mouth daily.  ? folic acid (FOLVITE) 1 MG tablet Take 1 tablet (1 mg total) by mouth daily.  ? levothyroxine (SYNTHROID, LEVOTHROID) 112 MCG tablet Take 112 mcg by mouth daily.  ?  metoprolol succinate (TOPROL-XL) 50 MG 24 hr tablet Take 1 tablet (50 mg total) by mouth daily. Take with or immediately following a meal.  ? mupirocin cream (BACTROBAN) 2 % Apply 1 application topically daily as needed (after skin treatments).   ? Omega-3 1400 MG CAPS Take by mouth daily.  ? rosuvastatin (CRESTOR) 40 MG tablet Take 1 tablet by mouth every day  ? terbinafine (LAMISIL) 1 % cream Apply 1 application topically daily as needed (rash).   ?  ?  ?  ?Allergies:   Patient has no known allergies.  ?  ?Social History  ?  ?     ?Socioeconomic History  ? Marital status: Married  ?    Spouse name: Not on file  ? Number of children: Not on file  ? Years of education: Not on file  ? Highest education level: Not on file  ?Occupational History  ? Not on file  ?Tobacco Use  ? Smoking status: Former  ?    Types: Cigarettes  ?    Quit date: 01/28/1971  ?    Years since quitting: 50.2  ?    Passive exposure: Never  ? Smokeless tobacco: Never  ?Vaping Use  ? Vaping Use: Never used  ?Substance and Sexual Activity  ? Alcohol use: Yes  ?    Comment: socially  ? Drug use: No  ? Sexual activity: Not on file  ?Other Topics Concern  ? Not on file  ?Social History Narrative  ? Not on file  ?  ?Social Determinants of Health  ?  ?Financial Resource Strain: Not on file  ?Food Insecurity: Not on file  ?Transportation Needs: Not on file  ?Physical Activity: Not on file  ?Stress: Not on file  ?Social Connections: Not on file  ?  ?  ?Family History: ?The patient's family history includes Heart attack in his father. ?  ?ROS:   ?Please see the history of present illness.    ?All other systems reviewed and are negative. ?  ?EKGs/Labs/Other Studies Reviewed:   ?  ?The following studies were reviewed today: ?Echo 02/23: ?1. Left ventricular ejection fraction, by estimation, is 60 to 65%. The  ?left ventricle has normal function. The left ventricle has no regional  ?wall motion abnormalities. There is mild left ventricular hypertrophy.  ?Left  ventricular diastolic parameters  ?were normal.  ? 2. Right ventricular systolic function is normal. The right ventricular  ?size is normal. There is normal pulmonary artery systolic pressure.  ? 3. Left atrial size was moderately dilated.  ? 4. The mitral valve is degenerative. Trivial mitral valve regurgitation.  ?No  evidence of mitral stenosis. Moderate mitral annular calcification.  ? 5. Gradients have increased and AVA decreased since echo done 8.11/22.  ?The aortic valve is tricuspid. There is severe calcifcation of the aortic  ?valve. There is severe thickening of the aortic valve. Aortic valve  ?regurgitation is mild. Severe aortic  ?valve stenosis.  ? 6. The inferior vena cava is normal in size with greater than 50%  ?respiratory variability, suggesting right atrial pressure of 3 mmHg.  ?  ?LVIDd:         4.50 cm   Diastology  ?LVIDs:         2.60 cm   LV e' medial:    5.00 cm/s  ?LV PW:         0.90 cm   LV E/e' medial:  22.4  ?LV IVS:        1.30 cm   LV e' lateral:   9.03 cm/s  ?LVOT diam:     2.00 cm   LV E/e' lateral: 12.4  ?LV SV:         103  ?LV SV Index:   55        2D Longitudinal Strain  ?LVOT Area:     3.14 cm?  2D Strain GLS (A2C):   -16.6 %  ?                         2D Strain GLS (A3C):   -18.8 %  ?                         2D Strain GLS (A4C):   -18.1 %  ?                         2D Strain GLS Avg:     -17.8 %  ? ?RIGHT VENTRICLE  ?RV Basal diam:  4.15 cm  ?RV Mid diam:    3.30 cm  ?RV S prime:     10.40 cm/s  ?TAPSE (M-mode): 2.6 cm  ?RVSP:           33.9 mmHg  ? ?LEFT ATRIUM             Index        RIGHT ATRIUM           Index  ?LA diam:        4.40 cm 2.33 cm/m?   RA Pressure: 3.00 mmHg  ?LA Vol (A2C):   67.3 ml 35.64 ml/m?  RA Area:     16.90 cm?  ?LA Vol (A4C):   69.2 ml 36.64 ml/m?  RA Volume:   43.10 ml  22.82 ml/m?  ?LA Biplane Vol: 66.7 ml 35.32 ml/m?  ? AORTIC VALVE  ?AV Area (Vmax):    0.75 cm?  ?AV Area (Vmean):   0.71 cm?  ?AV Area (VTI):     0.80 cm?  ?AV Vmax:           456.00  cm/s  ?AV Vmean:          352.000 cm/s  ?AV VTI:            1.290 m  ?AV Peak Grad:      83.2 mmHg  ?AV Mean Grad:      53.0 mmHg  ?LVOT Vmax:         109.00 cm/s  ?LVOT Vmean:  79.600 cm/s  ?LVOT VTI

## 2021-06-06 ENCOUNTER — Other Ambulatory Visit: Payer: Self-pay

## 2021-06-06 ENCOUNTER — Encounter (HOSPITAL_COMMUNITY): Payer: Self-pay | Admitting: Cardiovascular Disease

## 2021-06-06 DIAGNOSIS — I440 Atrioventricular block, first degree: Secondary | ICD-10-CM

## 2021-06-06 DIAGNOSIS — I25119 Atherosclerotic heart disease of native coronary artery with unspecified angina pectoris: Secondary | ICD-10-CM | POA: Diagnosis not present

## 2021-06-06 DIAGNOSIS — I350 Nonrheumatic aortic (valve) stenosis: Secondary | ICD-10-CM

## 2021-06-06 DIAGNOSIS — E782 Mixed hyperlipidemia: Secondary | ICD-10-CM | POA: Diagnosis not present

## 2021-06-06 DIAGNOSIS — N1832 Chronic kidney disease, stage 3b: Secondary | ICD-10-CM

## 2021-06-06 DIAGNOSIS — Z951 Presence of aortocoronary bypass graft: Secondary | ICD-10-CM | POA: Diagnosis not present

## 2021-06-06 LAB — BASIC METABOLIC PANEL
Anion gap: 5 (ref 5–15)
BUN: 21 mg/dL (ref 8–23)
CO2: 21 mmol/L — ABNORMAL LOW (ref 22–32)
Calcium: 8.8 mg/dL — ABNORMAL LOW (ref 8.9–10.3)
Chloride: 111 mmol/L (ref 98–111)
Creatinine, Ser: 1.48 mg/dL — ABNORMAL HIGH (ref 0.61–1.24)
GFR, Estimated: 46 mL/min — ABNORMAL LOW (ref >60)
Glucose, Bld: 84 mg/dL (ref 70–99)
Potassium: 4.8 mmol/L (ref 3.5–5.1)
Sodium: 137 mmol/L (ref 135–145)

## 2021-06-06 LAB — CBC
HCT: 34.6 % — ABNORMAL LOW (ref 39.0–52.0)
Hemoglobin: 11.2 g/dL — ABNORMAL LOW (ref 13.0–17.0)
MCH: 32.6 pg (ref 26.0–34.0)
MCHC: 32.4 g/dL (ref 30.0–36.0)
MCV: 100.6 fL — ABNORMAL HIGH (ref 80.0–100.0)
Platelets: 217 10*3/uL (ref 150–400)
RBC: 3.44 MIL/uL — ABNORMAL LOW (ref 4.22–5.81)
RDW: 13.2 % (ref 11.5–15.5)
WBC: 10.1 10*3/uL (ref 4.0–10.5)
nRBC: 0 % (ref 0.0–0.2)

## 2021-06-06 MED ORDER — CLOPIDOGREL BISULFATE 75 MG PO TABS: 75.0000 mg | ORAL_TABLET | Freq: Every day | ORAL | 3 refills | Status: DC

## 2021-06-06 NOTE — Progress Notes (Signed)
CARDIAC REHAB PHASE I  ? ?PRE:  Rate/Rhythm: 57 SB ? ?BP:  Sitting: 161/53     ? ?SaO2: 100 RA ? ?MODE:  Ambulation: 200 ft  ? ?POST:  Rate/Rhythm: 64 SR ? ?BP:  Sitting: 169/58 ? ?  SaO2: 100 RA ? ? ?Pt ambulated 235f in hallway independently with steady gait. Pt denies CP, SOB, or dizziness. Pt returned to EOB. Pt and family educated on importance of ASA and Plavix. Reviewed site care, restrictions, and exercise guidelines. Will refer to CRP II [place] with knowledge pt is getting worked up for a TAVR. ? ?[phone]?TRufina Falco RN BSN ?06/06/2021 ?10:12 AM ? ?

## 2021-06-07 LAB — LIPOPROTEIN A (LPA): Lipoprotein (a): 442.8 nmol/L — ABNORMAL HIGH (ref <75.0)

## 2021-06-20 ENCOUNTER — Ambulatory Visit: Payer: PPO | Admitting: Physician Assistant

## 2021-06-20 ENCOUNTER — Encounter: Payer: Self-pay | Admitting: Physician Assistant

## 2021-06-20 VITALS — BP 134/52 | HR 54 | Ht 65.5 in | Wt 172.0 lb

## 2021-06-20 DIAGNOSIS — I350 Nonrheumatic aortic (valve) stenosis: Secondary | ICD-10-CM

## 2021-06-20 DIAGNOSIS — E782 Mixed hyperlipidemia: Secondary | ICD-10-CM

## 2021-06-20 DIAGNOSIS — I10 Essential (primary) hypertension: Secondary | ICD-10-CM

## 2021-06-20 DIAGNOSIS — I2510 Atherosclerotic heart disease of native coronary artery without angina pectoris: Secondary | ICD-10-CM | POA: Diagnosis not present

## 2021-06-20 DIAGNOSIS — N1832 Chronic kidney disease, stage 3b: Secondary | ICD-10-CM

## 2021-06-20 NOTE — Progress Notes (Signed)
55M=16.18f   5 Meter Walk Test- trial 1: 7.30 seconds  5 Meter Walk Test- trial 2: 5.59 seconds  5 Meter Walk Test- trial 3: 6.44 seconds

## 2021-06-20 NOTE — Progress Notes (Signed)
[facility] [place]                                     Cardiology Office Note:    Date:  06/20/2021   ID:  Dillon [name], DOB [birth year], MRN [id number]  PCP:  Jenel Lucks, PA-C  [facility] Cardiologist:  Shelva Majestic, MD  [place] Electrophysiologist:  None   Referring MD: Burna Cash*   Follow up s/p PCI  History of Present Illness:    Dillon [name] is a 88 y.o. male with a hx of CAD s/p CABG x3V (2009 by [facility]) and left main PCI by Dr Burt Knack on 06/05/21, CKD stage IIIb, carotid stenosis, hypertension, aortic stenosis and mixed hyperlipidemia who presents to clinic for follow up.    His cardiac history dates back to 2009 when he was having exertional angina and had a high risk nuclear stress test.  He was admitted for cardiac catheterization which demonstrated critical left main and multivessel disease and the patient was taken emergently for coronary bypass surgery by Dr. Cyndia Bent.  He was treated with a LIMA to LAD, saphenous vein graft to OM, and saphenous vein graft to RCA. The patient has done well since that time.   The patient has been noted to have aortic stenosis and has been followed now for many years.  In 2020 and 2021 he had findings consistent with moderate aortic stenosis.  However, in spring 2022 the patient's echocardiogram demonstrated progressive and now severe aortic stenosis with a mean transvalvular gradient greater than 40 mmHg.  A follow-up echocardiogram in August 2022 showed stable findings with a mean gradient of 47 mmHg.  His recent echo continues to show normal LV function with mild LVH.  Aortic gradients have continued to worsen now with a peak gradient of 83 mmHg and a mean gradient of 53 mmHg.  The patient reports mild fatigue and mild exertional dyspnea with certain physical activities such as working in his yard.   He was seen by Dr Burt Knack in the office on 04/25/21 to discuss starting  TAVR work up. He underwent L/RHC on 05/14/21 which showed severe native CAD with 95% eccentric ostial left main stenosis; 40% first diagonal stenosis of the LAD; 50% proximal circumflex stenosis; and ostial occlusion of the RCA. There was an atretic very small caliber LIMA graft supplying the mid distal LAD, patent vein graft supplying the left circumflex vessel with 40 - 50% proximal third stenosis and patent vein graft supplying the RCA with filling of the RCA retrograde to the ostium.  There is a valve in the body of the graft with narrowing of 65 proximal and 50% distal to the probable valve. Plan was to consider TAVR with left main PCI, which was set up for 06/05/21 with Dr. Burt Knack.   He underwent successful IVUS guided PCI of severe 90% stenosis at the ostium of the left mainstem, treated with a 4.5 x 12 mm Synergy DES. Continued on Aspirin and Plavix. He was kept overnight for hydration and creat improved from 1.85-->1.45.  Today the patient presents to clinic for follow up. Has not noticed a big difference since PCI but never had many symptoms. Sometimes has to quit mowing the lawn to take some rests and deep breaths. No CP. Minimal SOB. No LE edema, orthopnea or PND. No dizziness or syncope. No blood in stool or  urine. No palpitations.     Past Medical History:  Diagnosis Date   CAD (coronary artery disease)    Heart murmur    History of hiatal hernia    Hypertension    Hypothyroidism    Pneumonia 12/2015   hx   S/P CABG x 3 12/17/07   LIMA to LAD,SVG to left C    Past Surgical History:  Procedure Laterality Date   CORONARY ARTERY BYPASS GRAFT  12/17/07   LIMA to LAD,vein to obtuse marginal,vein to RCA   CORONARY STENT INTERVENTION N/A 06/05/2021   Procedure: CORONARY STENT INTERVENTION;  Surgeon: Sherren Mocha, MD;  Location: [place] [facility];  Service: Cardiovascular;  Laterality: N/A;   EMPYEMA DRAINAGE Left 01/11/2016   Procedure: EMPYEMA DRAINAGE;  Surgeon: Ivin Poot,  MD;  Location: [place];  Service: Thoracic;  Laterality: Left;   ENDARTERECTOMY Right 03/26/2016   Procedure: RIGHT CAROTID ENDARTERECTOMY;  Surgeon: Serafina Mitchell, MD;  Location: [place];  Service: Vascular;  Laterality: Right;   PATCH ANGIOPLASTY Right 03/26/2016   Procedure: PATCH ANGIOPLASTY USING Rueben Bash BIOLOGIC PATCH;  Surgeon: Serafina Mitchell, MD;  Location: [place];  Service: Vascular;  Laterality: Right;   RIGHT HEART CATH AND CORONARY/GRAFT ANGIOGRAPHY N/A 05/14/2021   Procedure: RIGHT HEART CATH AND CORONARY/GRAFT ANGIOGRAPHY;  Surgeon: Troy Sine, MD;  Location: [place] [facility];  Service: Cardiovascular;  Laterality: N/A;   VIDEO ASSISTED THORACOSCOPY (VATS)/DECORTICATION Left 01/11/2016   Procedure: VIDEO ASSISTED THORACOSCOPY (VATS)/DECORTICATION;  Surgeon: Ivin Poot, MD;  Location[facility];  Service: Thoracic;  Laterality: Left;    Current Medications: Current Meds  Medication Sig   amLODipine (NORVASC) 5 MG tablet Take 1 tablet by mouth daily   aspirin EC 81 MG tablet Take 81 mg by mouth at bedtime.    Cholecalciferol (VITAMIN D) 2000 units tablet Take 2,000 Units by mouth at bedtime.   clopidogrel (PLAVIX) 75 MG tablet Take 1 tablet (75 mg total) by mouth daily.   Cyanocobalamin (B-12) 2000 MCG TABS Take 2,000 mcg by mouth daily.   fexofenadine (ALLEGRA) 180 MG tablet Take 180 mg by mouth daily.   folic acid (FOLVITE) 1 MG tablet Take 1 tablet (1 mg total) by mouth daily.   levothyroxine (SYNTHROID, LEVOTHROID) 112 MCG tablet Take 112 mcg by mouth daily before breakfast.   metoprolol succinate (TOPROL-XL) 50 MG 24 hr tablet Take 1 tablet (50 mg total) by mouth daily. Take with or immediately following a meal.   mupirocin cream (BACTROBAN) 2 % Apply 1 application topically daily as needed (after skin treatments).    Omega-3 1400 MG CAPS Take 1,400 mg by mouth daily.   rosuvastatin (CRESTOR) 40 MG tablet Take 1 tablet by mouth every day   terbinafine (LAMISIL) 1 %  cream Apply 1 application topically daily as needed (rash).      Allergies:   Patient has no known allergies.   Social History   Socioeconomic History   Marital status: Married    Spouse name: Not on file   Number of children: Not on file   Years of education: Not on file   Highest education level: Not on file  Occupational History   Not on file  Tobacco Use   Smoking status: Former    Types: Cigarettes    Quit date: 01/28/1971    Years since quitting: 50.4    Passive exposure: Never   Smokeless tobacco: Never  Vaping Use   Vaping Use: Never used  Substance and Sexual Activity   Alcohol use: Yes    Comment: socially   Drug use: No   Sexual activity: Not on file  Other Topics Concern   Not on file  Social History Narrative   Not on file   Social Determinants of Health   Financial Resource Strain: Not on file  Food Insecurity: Not on file  Transportation Needs: Not on file  Physical Activity: Not on file  Stress: Not on file  Social Connections: Not on file     Family History: The patient's family history includes Heart attack in his father.  ROS:   Please see the history of present illness.    All other systems reviewed and are negative.  EKGs/Labs/Other Studies Reviewed:    The following studies were reviewed today:  Coronary stent intervention 06/05/21   Conclusion   Successful IVUS guided PCI of severe 90% stenosis at the ostium of the left mainstem, treated with a 4.5 x 12 mm Synergy DES.   Recommend: Overnight observation and hydration, repeat metabolic panel tomorrow morning, continue with plans for TAVR for treatment of severe symptomatic aortic stenosis.   _____________  Echo 03/19/21 IMPRESSIONS   1. Left ventricular ejection fraction, by estimation, is 60 to 65%. The  left ventricle has normal function. The left ventricle has no regional  wall motion abnormalities. There is mild left ventricular hypertrophy.  Left ventricular diastolic  parameters  were normal.   2. Right ventricular systolic function is normal. The right ventricular  size is normal. There is normal pulmonary artery systolic pressure.   3. Left atrial size was moderately dilated.   4. The mitral valve is degenerative. Trivial mitral valve regurgitation.  No evidence of mitral stenosis. Moderate mitral annular calcification.   5. Gradients have increased and AVA decreased since echo done 8.11/22.  The aortic valve is tricuspid. There is severe calcifcation of the aortic  valve. There is severe thickening of the aortic valve. Aortic valve  regurgitation is mild. Severe aortic  valve stenosis.   6. The inferior vena cava is normal in size with greater than 50%  respiratory variability, suggesting right atrial pressure of 3 mmHg.   EKG:  EKG is NOT ordered today.   Recent Labs: 05/03/2021: ALT 11 06/06/2021: BUN 21; Creatinine, Ser 1.48; Hemoglobin 11.2; Platelets 217; Potassium 4.8; Sodium 137  Recent Lipid Panel    Component Value Date/Time   CHOL 145 01/18/2014 0803   CHOL 139 08/30/2012 0845   TRIG 70 01/18/2014 0803   TRIG 82 08/30/2012 0845   HDL 64 01/18/2014 0803   HDL 68 08/30/2012 0845   CHOLHDL 2.3 01/18/2014 0803   VLDL 14 01/18/2014 0803   LDLCALC 67 01/18/2014 0803   LDLCALC 55 08/30/2012 0845     Risk Assessment/Calculations:       Physical Exam:    VS:  BP (!) 134/52 (BP Location: Left Arm, Patient Position: Sitting, Cuff Size: Normal)   Pulse (!) 54   Ht 5' 5.5" (1.664 m)   Wt 172 lb (78 kg)   BMI 28.19 kg/m     Wt Readings from Last 3 Encounters:  06/20/21 172 lb (78 kg)  06/05/21 172 lb (78 kg)  05/14/21 173 lb (78.5 kg)     GEN:  Well nourished, well developed in no acute distress HEENT: Normal NECK: No JVD LYMPHATICS: No lymphadenopathy CARDIAC: RRR, 3/6 SEM. No rubs, gallops RESPIRATORY:  Clear to auscultation without rales, wheezing or rhonchi  ABDOMEN: Soft, non-tender,  non-distended MUSCULOSKELETAL:  No  edema; No deformity  SKIN: Warm and dry NEUROLOGIC:  Alert and oriented x 3 PSYCHIATRIC:  Normal affect   ASSESSMENT:    1. CAD in native artery   2. Stage 3b chronic kidney disease ([place])   3. Severe aortic stenosis   4. Primary hypertension   5. Hyperlipidemia, mixed    PLAN:    In order of problems listed above:  CAD s/p PCI: doing well. No chest pain. Continue DAPT x 12 months. Continue high intensity statin.   CKD stage IIIb: he was kept overnight for hydration. Creat improved from 1.85--> 1.45. BMET today   Severe aortic stenosis: set up for CT scans on 5/30 with hydration. Instructions given today. He has NYHA class II symptoms. KCCQ and 5MWT completed today. He will see Dr. Cyndia Bent next month   HTN: BP well controlled. No changes made.    HLD: last lipid panel over a year ago but LDL 59. LPA ordered and was abnormal at 442.8. Continue high intensity statin        Medication Adjustments/Labs and Tests Ordered: Current medicines are reviewed at length with the patient today.  Concerns regarding medicines are outlined above.  Orders Placed This Encounter  Procedures   Basic metabolic panel   No orders of the defined types were placed in this encounter.   Patient Instructions  Medication Instructions:   Your physician recommends that you continue on your current medications as directed. Please refer to the Current Medication list given to you today.   *If you need a refill on your cardiac medications before your next appointment, please call your pharmacy*   Lab Work: BMET TODAY    If you have labs (blood work) drawn today and your tests are completely normal, you will receive your results only by: [place] (if you have MyChart) OR A paper copy in the mail If you have any lab test that is abnormal or we need to change your treatment, we will call you to review the results.   Testing/Procedures: SEE LETTER     Follow-Up: At [facility], you  and your health needs are our priority.  As part of our continuing mission to provide you with exceptional heart care, we have created designated Provider Care Teams.  These Care Teams include your primary Cardiologist (physician) and Advanced Practice Providers (APPs -  Physician Assistants and Nurse Practitioners) who all work together to provide you with the care you need, when you need it.  We recommend signing up for the patient portal called "MyChart".  Sign up information is provided on this After Visit Summary.  MyChart is used to connect with patients for Virtual Visits (Telemedicine).  Patients are able to view lab/test results, encounter notes, upcoming appointments, etc.  Non-urgent messages can be sent to your provider as well.   To learn more about what you can do with MyChart, go to [email or web address].    Your next appointment:  AS SCHEDULED   The format for your next appointment:   In Person {   Other Instructions   Important Information About Sugar         Signed, Angelena Form, PA-C  06/20/2021 3:40 PM    [place]

## 2021-06-20 NOTE — Patient Instructions (Signed)
Medication Instructions:   Your physician recommends that you continue on your current medications as directed. Please refer to the Current Medication list given to you today.   *If you need a refill on your cardiac medications before your next appointment, please call your pharmacy*   Lab Work: BMET TODAY    If you have labs (blood work) drawn today and your tests are completely normal, you will receive your results only by: [place] (if you have MyChart) OR A paper copy in the mail If you have any lab test that is abnormal or we need to change your treatment, we will call you to review the results.   Testing/Procedures: SEE LETTER     Follow-Up: At [facility], you and your health needs are our priority.  As part of our continuing mission to provide you with exceptional heart care, we have created designated Provider Care Teams.  These Care Teams include your primary Cardiologist (physician) and Advanced Practice Providers (APPs -  Physician Assistants and Nurse Practitioners) who all work together to provide you with the care you need, when you need it.  We recommend signing up for the patient portal called "MyChart".  Sign up information is provided on this After Visit Summary.  MyChart is used to connect with patients for Virtual Visits (Telemedicine).  Patients are able to view lab/test results, encounter notes, upcoming appointments, etc.  Non-urgent messages can be sent to your provider as well.   To learn more about what you can do with MyChart, go to [email or web address].    Your next appointment:  AS SCHEDULED   The format for your next appointment:   In Person {   Other Instructions   Important Information About Sugar

## 2021-06-21 ENCOUNTER — Telehealth: Payer: Self-pay | Admitting: Physician Assistant

## 2021-06-21 ENCOUNTER — Other Ambulatory Visit: Payer: Self-pay | Admitting: Physician Assistant

## 2021-06-21 DIAGNOSIS — E875 Hyperkalemia: Secondary | ICD-10-CM

## 2021-06-21 LAB — BASIC METABOLIC PANEL
BUN/Creatinine Ratio: 14 (ref 10–24)
BUN: 23 mg/dL (ref 8–27)
CO2: 23 mmol/L (ref 20–29)
Calcium: 9.2 mg/dL (ref 8.6–10.2)
Chloride: 104 mmol/L (ref 96–106)
Creatinine, Ser: 1.59 mg/dL — ABNORMAL HIGH (ref 0.76–1.27)
Glucose: 83 mg/dL (ref 70–99)
Potassium: 5.6 mmol/L — ABNORMAL HIGH (ref 3.5–5.2)
Sodium: 138 mmol/L (ref 134–144)
eGFR: 42 mL/min/{1.73_m2} — ABNORMAL LOW (ref >59)

## 2021-06-21 MED ORDER — LOKELMA 10 G PO PACK
10.0000 g | PACK | Freq: Once | ORAL | 0 refills | Status: AC
Start: 2021-06-21 — End: 2021-06-21

## 2021-06-21 NOTE — Progress Notes (Signed)
Ordered a BMET to recheck K

## 2021-06-21 NOTE — Telephone Encounter (Signed)
Pt c/o medication issue:  1. Name of Medication: Lokelma  2. How are you currently taking this medication (dosage and times per day)?   3. Are you having a reaction (difficulty breathing--STAT)?   4. What is your medication issue? Pharmacy requesting call back to get clarification on dosage for this med prescribed by Angelena Form.

## 2021-06-21 NOTE — Telephone Encounter (Signed)
From previous note:   Will call and cancel lokelma.   KT   Notified pharmacy no longer needed.

## 2021-06-21 NOTE — Telephone Encounter (Signed)
Pt c/o medication issue:  1. Name of Medication:   2. How are you currently taking this medication (dosage and times per day)?   3. Are you having a reaction (difficulty breathing--STAT)?   4. What is your medication issue?   Patient's wife is following up regarding lab results, requesting to speak with Angelena Form again. She states the patient has been taking Potassium and she would like to discuss this before a new prescription is called in if possible.

## 2021-06-25 ENCOUNTER — Other Ambulatory Visit: Payer: PPO | Admitting: *Deleted

## 2021-06-25 ENCOUNTER — Ambulatory Visit (HOSPITAL_COMMUNITY)
Admission: RE | Admit: 2021-06-25 | Discharge: 2021-06-25 | Disposition: A | Discharge status: Home / Self Care | Payer: PPO | Source: Ambulatory Visit | Attending: Physician Assistant | Admitting: Physician Assistant

## 2021-06-25 ENCOUNTER — Ambulatory Visit (HOSPITAL_COMMUNITY)
Admission: RE | Admit: 2021-06-25 | Discharge: 2021-06-25 | Disposition: A | Discharge status: Home / Self Care | Payer: PPO | Source: Ambulatory Visit | Attending: Cardiovascular Disease | Admitting: Cardiovascular Disease

## 2021-06-25 DIAGNOSIS — N1832 Chronic kidney disease, stage 3b: Secondary | ICD-10-CM | POA: Insufficient documentation

## 2021-06-25 DIAGNOSIS — I350 Nonrheumatic aortic (valve) stenosis: Secondary | ICD-10-CM | POA: Insufficient documentation

## 2021-06-25 DIAGNOSIS — E875 Hyperkalemia: Secondary | ICD-10-CM

## 2021-06-25 MED ORDER — SODIUM CHLORIDE 0.9 % WEIGHT BASED INFUSION: 1.0000 mL/kg/h | INTRAVENOUS | Status: DC

## 2021-06-25 MED ORDER — IOHEXOL 350 MG/ML SOLN
100.0000 mL | Freq: Once | INTRAVENOUS | Status: AC | PRN
  Administered 2021-06-25: 100 mL via INTRAVENOUS

## 2021-06-25 MED ORDER — SODIUM CHLORIDE 0.9 % WEIGHT BASED INFUSION
3.0000 mL/kg/h | INTRAVENOUS | Status: AC
  Administered 2021-06-25: 3 mL/kg/h via INTRAVENOUS

## 2021-06-26 LAB — BASIC METABOLIC PANEL
BUN/Creatinine Ratio: 17 (ref 10–24)
BUN: 25 mg/dL (ref 8–27)
CO2: 21 mmol/L (ref 20–29)
Calcium: 9 mg/dL (ref 8.6–10.2)
Chloride: 105 mmol/L (ref 96–106)
Creatinine, Ser: 1.46 mg/dL — ABNORMAL HIGH (ref 0.76–1.27)
Glucose: 197 mg/dL — ABNORMAL HIGH (ref 70–99)
Potassium: 4.8 mmol/L (ref 3.5–5.2)
Sodium: 139 mmol/L (ref 134–144)
eGFR: 47 mL/min/{1.73_m2} — ABNORMAL LOW (ref >59)

## 2021-07-24 ENCOUNTER — Encounter: Payer: PPO | Admitting: Surgery

## 2021-07-31 ENCOUNTER — Encounter: Payer: PPO | Admitting: Surgery

## 2021-08-06 ENCOUNTER — Other Ambulatory Visit: Payer: Self-pay

## 2021-08-06 DIAGNOSIS — I350 Nonrheumatic aortic (valve) stenosis: Secondary | ICD-10-CM

## 2021-08-07 ENCOUNTER — Encounter: Payer: Self-pay | Admitting: Surgery

## 2021-08-07 ENCOUNTER — Institutional Professional Consult (permissible substitution): Payer: PPO | Admitting: Surgery

## 2021-08-07 VITALS — BP 152/68 | HR 60 | Resp 18 | Ht 65.0 in | Wt 172.0 lb

## 2021-08-07 DIAGNOSIS — I350 Nonrheumatic aortic (valve) stenosis: Secondary | ICD-10-CM

## 2021-08-07 NOTE — Progress Notes (Signed)
Patient ID: Dillon [name], male   DOB: 1933/10/08, 88 y.o.   MRN: [id number]  [facility]INIC         [address]       ,[place] [address]             [phone]          CARDIOTHORACIC SURGERY CONSULTATION REPORT  PCP is Jenel Lucks, PA-C Referring Provider is Sherren Mocha, MD Primary Cardiologist is Shelva Majestic, MD  Reason for consultation:  Severe aortic stenosis  HPI:  The patient is an 88 year old gentleman with a history of hypertension, hyperlipidemia hypothyroidism, carotid artery disease, coronary artery disease status post CABG x3 by me in 2009 and aortic stenosis who has been referred for consideration of TAVR.  His coronary artery bypass grafts included a LIMA to the LAD, saphenous vein graft to the obtuse marginal, and saphenous vein graft to the RCA.  In 2017 he underwent a left VATS/decortication for empyema by Dr. Darcey Nora.  He then underwent a right carotid endarterectomy by Dr. [name] in 2018.  He has had moderate aortic stenosis in the past.  His most recent echo in February 2023 showed an increase in the mean gradient to 53 mmHg with a peak gradient of 83 mmHg.  Valve area was 0.8 cm.  Left ventricular ejection fraction remains normal at 60 to 65% with mild LVH.  He has done well over the years but now reports onset of exertional fatigue and shortness of breath with moderate activities such as working in his yard.  He says that he remains active but it takes him longer to do the same amount of work.  He has had no chest pain or pressure.  He denies dizziness and syncope.  He has had no orthopnea or peripheral edema.  He underwent cardiac catheterization on 05/14/2021 showing 95% eccentric ostial left main stenosis with an atretic LIMA to the distal LAD.  There is ostial occlusion of the RCA.  There was a patent vein graft supplying the left circumflex but had 40 to 50% proximal stenosis  and a patent vein graft supplying the RCA with filling of the RCA retrograde to the ostium.  He subsequently underwent PCI of the ostial left main stenosis with a DES on 06/05/2021.  He says that he has not noticed much difference in his exertional symptoms since his PCI but never thought much of his symptoms before anyway.   Past Medical History:  Diagnosis Date   CAD (coronary artery disease)    Heart murmur    History of hiatal hernia    Hypertension    Hypothyroidism    Pneumonia 12/2015   hx   S/P CABG x 3 12/17/07   LIMA to LAD,SVG to left C    Past Surgical History:  Procedure Laterality Date   CORONARY ARTERY BYPASS GRAFT  12/17/07   LIMA to LAD,vein to obtuse marginal,vein to RCA   CORONARY STENT INTERVENTION N/A 06/05/2021   Procedure: CORONARY STENT INTERVENTION;  Surgeon: Sherren Mocha, MD;  Location: [facility];  Service: Cardiovascular;  Laterality: N/A;   EMPYEMA DRAINAGE Left 01/11/2016   Procedure: EMPYEMA DRAINAGE;  Surgeon: Ivin Poot, MD;  Location: [facility];  Service: Thoracic;  Laterality: Left;   ENDARTERECTOMY Right 03/26/2016   Procedure: RIGHT CAROTID ENDARTERECTOMY;  Surgeon: Serafina Mitchell, MD;  Location: [facility];  Service: Vascular;  Laterality: Right;   PATCH ANGIOPLASTY  Right 03/26/2016   Procedure: PATCH ANGIOPLASTY USING Rueben Bash BIOLOGIC PATCH;  Surgeon: Serafina Mitchell, MD;  Location: [facility];  Service: Vascular;  Laterality: Right;   RIGHT HEART CATH AND CORONARY/GRAFT ANGIOGRAPHY N/A 05/14/2021   Procedure: RIGHT HEART CATH AND CORONARY/GRAFT ANGIOGRAPHY;  Surgeon: Troy Sine, MD;  Location: [facility];  Service: Cardiovascular;  Laterality: N/A;   VIDEO ASSISTED THORACOSCOPY (VATS)/DECORTICATION Left 01/11/2016   Procedure: VIDEO ASSISTED THORACOSCOPY (VATS)/DECORTICATION;  Surgeon: Ivin Poot, MD;  Location: [facility];  Service: Thoracic;  Laterality: Left;    Family History  Problem Relation Age of Onset   Heart attack  Father     Social History   Socioeconomic History   Marital status: Married    Spouse name: Not on file   Number of children: Not on file   Years of education: Not on file   Highest education level: Not on file  Occupational History   Not on file  Tobacco Use   Smoking status: Former    Types: Cigarettes    Quit date: 01/28/1971    Years since quitting: 50.5    Passive exposure: Never   Smokeless tobacco: Never  Vaping Use   Vaping Use: Never used  Substance and Sexual Activity   Alcohol use: Yes    Comment: socially   Drug use: No   Sexual activity: Not on file  Other Topics Concern   Not on file  Social History Narrative   Not on file   Social Determinants of Health   Financial Resource Strain: Not on file  Food Insecurity: Not on file  Transportation Needs: Not on file  Physical Activity: Not on file  Stress: Not on file  Social Connections: Not on file  Intimate Partner Violence: Not on file    Prior to Admission medications   Medication Sig Start Date End Date Taking? Authorizing Provider  amLODipine (NORVASC) 5 MG tablet Take 1 tablet by mouth daily 08/23/20  Yes Troy Sine, MD  aspirin EC 81 MG tablet Take 81 mg by mouth at bedtime.    Yes [provider]  Cholecalciferol (VITAMIN D) 2000 units tablet Take 2,000 Units by mouth at bedtime.   Yes [provider]  clopidogrel (PLAVIX) 75 MG tablet Take 1 tablet (75 mg total) by mouth daily. 06/06/21  Yes Eileen Stanford, PA-C  Cyanocobalamin (B-12) 2000 MCG TABS Take 2,000 mcg by mouth daily. 05/28/20  Yes [provider]  fexofenadine (ALLEGRA) 180 MG tablet Take 180 mg by mouth daily.   Yes [provider]  folic acid (FOLVITE) 1 MG tablet Take 1 tablet (1 mg total) by mouth daily. 10/31/20  Yes Troy Sine, MD  levothyroxine (SYNTHROID, LEVOTHROID) 112 MCG tablet Take 112 mcg by mouth daily before breakfast.   Yes [provider]  metoprolol succinate  (TOPROL-XL) 50 MG 24 hr tablet Take 1 tablet (50 mg total) by mouth daily. Take with or immediately following a meal. 10/31/20  Yes Troy Sine, MD  mupirocin cream (BACTROBAN) 2 % Apply 1 application topically daily as needed (after skin treatments).  01/23/16  Yes [provider]  Omega-3 1400 MG CAPS Take 1,400 mg by mouth daily.   Yes [provider]  rosuvastatin (CRESTOR) 40 MG tablet Take 1 tablet by mouth every day 02/21/21  Yes Troy Sine, MD  terbinafine (LAMISIL) 1 % cream Apply 1 application topically daily as needed (rash).    Yes [provider]    Current Outpatient Medications  Medication Sig Dispense Refill   amLODipine (NORVASC) 5 MG tablet Take 1 tablet by mouth daily 90 tablet 3   aspirin EC 81 MG tablet Take 81 mg by mouth at bedtime.      Cholecalciferol (VITAMIN D) 2000 units tablet Take 2,000 Units by mouth at bedtime.     clopidogrel (PLAVIX) 75 MG tablet Take 1 tablet (75 mg total) by mouth daily. 90 tablet 3   Cyanocobalamin (B-12) 2000 MCG TABS Take 2,000 mcg by mouth daily.     fexofenadine (ALLEGRA) 180 MG tablet Take 180 mg by mouth daily.     folic acid (FOLVITE) 1 MG tablet Take 1 tablet (1 mg total) by mouth daily. 90 tablet 3   levothyroxine (SYNTHROID, LEVOTHROID) 112 MCG tablet Take 112 mcg by mouth daily before breakfast.     metoprolol succinate (TOPROL-XL) 50 MG 24 hr tablet Take 1 tablet (50 mg total) by mouth daily. Take with or immediately following a meal. 90 tablet 3   mupirocin cream (BACTROBAN) 2 % Apply 1 application topically daily as needed (after skin treatments).      Omega-3 1400 MG CAPS Take 1,400 mg by mouth daily.     rosuvastatin (CRESTOR) 40 MG tablet Take 1 tablet by mouth every day 90 tablet 2   terbinafine (LAMISIL) 1 % cream Apply 1 application topically daily as needed (rash).      No current facility-administered medications for this visit.    No Known Allergies    Review of  Systems:   General:  normal appetite, + decreased energy, + weight gain, no weight loss, no fever  Cardiac:  no chest pain with exertion, no chest pain at rest, +SOB with moderate exertion, no resting SOB, no PND, no orthopnea, no palpitations, no arrhythmia, no atrial fibrillation, no LE edema, no dizzy spells, no syncope  Respiratory:  + exertional shortness of breath, no home oxygen, no productive cough, no dry cough, no bronchitis, no wheezing, no hemoptysis, no asthma, no pain with inspiration or cough, no sleep apnea, no CPAP at night  GI:   no difficulty swallowing, no reflux, no frequent heartburn, + hiatal hernia, no abdominal pain, no constipation, no diarrhea, no hematochezia, no hematemesis, no melena  GU:   no dysuria,  no frequency, no urinary tract infection, no hematuria, no enlarged prostate, no kidney stones, no kidney disease  Vascular:  no pain suggestive of claudication, no pain in feet, no leg cramps, no varicose veins, no DVT, no non-healing foot ulcer  Neuro:   no stroke, no TIA's, no seizures, no headaches, no temporary blindness one eye,  no slurred speech, no peripheral neuropathy, no chronic pain, no instability of gait, no memory/cognitive dysfunction  Musculoskeletal: no arthritis, no joint swelling, no myalgias, no difficulty walking, normal mobility   Skin:   no rash, no itching, no skin infections, no pressure sores or ulcerations  Psych:   no anxiety, no depression, no nervousness, no unusual recent stress  Eyes:   no blurry vision, no floaters, no recent vision changes, last saw dentist this year  Hematologic:  no easy bruising, no abnormal bleeding, no clotting disorder, no frequent epistaxis  Endocrine:  no diabetes, does not check CBG's at home     Physical Exam:   BP (!) 152/68 (BP Location: Left Arm, Patient Position: Sitting)   Pulse 60   Resp 18   Ht '5\' 5"'$  (1.651 m)   Wt 172 lb (  78 kg)   SpO2 91% Comment: RA  BMI 28.62 kg/m   General:  Elderly,   well-appearing  HEENT:  Unremarkable, NCAT, PERLA, EOMI  Neck:   no JVD, no bruits, no adenopathy   Chest:   clear to auscultation, symmetrical breath sounds, no wheezes, no rhonchi   CV:   RRR, 3/6 systolic murmur RSB, no diastolic murmur  Abdomen:  soft, non-tender, no masses   Extremities:  warm, well-perfused, pulses palpable at ankle, no lower extremity edema  Rectal/GU  Deferred  Neuro:   Grossly non-focal and symmetrical throughout  Skin:   Clean and dry, no rashes, no breakdown  Diagnostic Tests:    ECHOCARDIOGRAM REPORT         Patient Name:   STEPHANE NIEMANN   Date of Exam: 03/19/2021  Medical Rec #:  [id number]     Height:       66.0 in  Accession #:    [id number]    Weight:       174.8 lb  Date of Birth:  05-Jul-1933      BSA:          1.889 m  Patient Age:    65 years      BP:           153/67 mmHg  Patient Gender: M             HR:           53 bpm.  Exam Location:  [address]   Procedure: 2D Echo, Cardiac Doppler, Color Doppler and Strain Analysis   Indications:    I35.0 Aortic Stenosis     History:        Patient has prior history of Echocardiogram examinations,  most                  recent 09/06/2020. CAD, Prior CABG, Signs/Symptoms:Murmur;  Risk                  Factors:Hypertension and HLD.     Sonographer:    Marygrace Drought RCS  Referring Phys: [place]     1. Left ventricular ejection fraction, by estimation, is 60 to 65%. The  left ventricle has normal function. The left ventricle has no regional  wall motion abnormalities. There is mild left ventricular hypertrophy.  Left ventricular diastolic parameters  were normal.   2. Right ventricular systolic function is normal. The right ventricular  size is normal. There is normal pulmonary artery systolic pressure.   3. Left atrial size was moderately dilated.   4. The mitral valve is degenerative. Trivial mitral valve regurgitation.  No evidence of mitral stenosis. Moderate mitral  annular calcification.   5. Gradients have increased and AVA decreased since echo done 8.11/22.  The aortic valve is tricuspid. There is severe calcifcation of the aortic  valve. There is severe thickening of the aortic valve. Aortic valve  regurgitation is mild. Severe aortic  valve stenosis.   6. The inferior vena cava is normal in size with greater than 50%  respiratory variability, suggesting right atrial pressure of 3 mmHg.   FINDINGS   Left Ventricle: Left ventricular ejection fraction, by estimation, is 60  to 65%. The left ventricle has normal function. The left ventricle has no  regional wall motion abnormalities. The left ventricular internal cavity  size was normal in size. There is   mild left ventricular hypertrophy. Left ventricular diastolic parameters  were  normal.   Right Ventricle: The right ventricular size is normal. No increase in  right ventricular wall thickness. Right ventricular systolic function is  normal. There is normal pulmonary artery systolic pressure. The tricuspid  regurgitant velocity is 2.78 m/s, and   with an assumed right atrial pressure of 3 mmHg, the estimated right  ventricular systolic pressure is 33.2 mmHg.   Left Atrium: Left atrial size was moderately dilated.   Right Atrium: Right atrial size was normal in size.   Pericardium: There is no evidence of pericardial effusion.   Mitral Valve: The mitral valve is degenerative in appearance. There is  moderate thickening of the mitral valve leaflet(s). There is moderate  calcification of the mitral valve leaflet(s). Moderate mitral annular  calcification. Trivial mitral valve  regurgitation. No evidence of mitral valve stenosis.   Tricuspid Valve: The tricuspid valve is normal in structure. Tricuspid  valve regurgitation is mild . No evidence of tricuspid stenosis.   Aortic Valve: Gradients have increased and AVA decreased since echo done  8.11/22. The aortic valve is tricuspid. There is  severe calcifcation of  the aortic valve. There is severe thickening of the aortic valve. Aortic  valve regurgitation is mild. Aortic  regurgitation PHT measures 504 msec. Severe aortic stenosis is present.  Aortic valve mean gradient measures 53.0 mmHg. Aortic valve peak gradient  measures 83.2 mmHg. Aortic valve area, by VTI measures 0.80 cm.   Pulmonic Valve: The pulmonic valve was normal in structure. Pulmonic valve  regurgitation is not visualized. No evidence of pulmonic stenosis.   Aorta: The aortic root is normal in size and structure.   Venous: The inferior vena cava is normal in size with greater than 50%  respiratory variability, suggesting right atrial pressure of 3 mmHg.   IAS/Shunts: No atrial level shunt detected by color flow Doppler.      LEFT VENTRICLE  PLAX 2D  LVIDd:         4.50 cm   Diastology  LVIDs:         2.60 cm   LV e' medial:    5.00 cm/s  LV PW:         0.90 cm   LV E/e' medial:  22.4  LV IVS:        1.30 cm   LV e' lateral:   9.03 cm/s  LVOT diam:     2.00 cm   LV E/e' lateral: 12.4  LV SV:         103  LV SV Index:   55        2D Longitudinal Strain  LVOT Area:     3.14 cm  2D Strain GLS (A2C):   -16.6 %                           2D Strain GLS (A3C):   -18.8 %                           2D Strain GLS (A4C):   -18.1 %                           2D Strain GLS Avg:     -17.8 %   RIGHT VENTRICLE  RV Basal diam:  4.15 cm  RV Mid diam:    3.30 cm  RV S prime:     10.40 cm/s  TAPSE (M-mode): 2.6 cm  RVSP:           33.9 mmHg   LEFT ATRIUM             Index        RIGHT ATRIUM           Index  LA diam:        4.40 cm 2.33 cm/m   RA Pressure: 3.00 mmHg  LA Vol (A2C):   67.3 ml 35.64 ml/m  RA Area:     16.90 cm  LA Vol (A4C):   69.2 ml 36.64 ml/m  RA Volume:   43.10 ml  22.82 ml/m  LA Biplane Vol: 66.7 ml 35.32 ml/m   AORTIC VALVE  AV Area (Vmax):    0.75 cm  AV Area (Vmean):   0.71 cm  AV Area (VTI):     0.80 cm  AV Vmax:            456.00 cm/s  AV Vmean:          352.000 cm/s  AV VTI:            1.290 m  AV Peak Grad:      83.2 mmHg  AV Mean Grad:      53.0 mmHg  LVOT Vmax:         109.00 cm/s  LVOT Vmean:        79.600 cm/s  LVOT VTI:          0.329 m  LVOT/AV VTI ratio: 0.26  AI PHT:            504 msec     AORTA  Ao Root diam: 3.10 cm  Ao Asc diam:  3.50 cm   MITRAL VALVE                TRICUSPID VALVE  MV Area (PHT):              TR Peak grad:   30.9 mmHg  MV Decel Time:              TR Vmax:        278.00 cm/s  MV E velocity: 112.00 cm/s  Estimated RAP:  3.00 mmHg  MV A velocity: 101.00 cm/s  RVSP:           33.9 mmHg  MV E/A ratio:  1.11                              SHUNTS                              Systemic VTI:  0.33 m                              Systemic Diam: 2.00 cm   Jenkins Rouge MD  Electronically signed by Jenkins Rouge MD  Signature Date/Time: 03/19/2021/10:40:19 AM         Final     Physicians  Panel Physicians Referring Physician Case Authorizing Physician  Troy Sine, MD (Primary)     Procedures  RIGHT HEART CATH AND CORONARY/GRAFT ANGIOGRAPHY   Conclusion      Ost LM lesion is 95% stenosed.   Ost RCA lesion is 100% stenosed.   Prox Graft-1 lesion is 65% stenosed.   Prox Graft-2 lesion is  50% stenosed.   Prox Graft lesion is 45% stenosed.   Ost Cx to Prox Cx lesion is 50% stenosed.   and is small.   Very mildly elevated right heart pressures.   Severely calcified aortic valve with echocardiographic documentation of a mean gradient of 53 mmHg, and peak gradient 83 mmHg.     Severe native CAD with 95% eccentric ostial left main stenosis; 40% first diagonal stenosis of the LAD; 50% proximal circumflex stenosis; and ostial occlusion of the RCA.   Atretic very small caliber LIMA graft supplying the mid distal LAD.   Patent vein graft supplying the left circumflex vessel with 40 - 50% proximal third stenosis.   Patent vein graft supplying the RCA with filling of  the RCA retrograde to the ostium.  There is a valve in the body of the graft with narrowing of 65 proximal and 50% distal to the probable valve.   RECOMMENDATION: The patient has a scheduled appointment to see Dr. Gilford Raid as part of the structural heart team evaluation.  He  will undergo follow-up CT imaging per TAVR protocol.  Ultimate decision regarding SAVR with revascularization versus TAVR and left main PCI per Drs. Bettina Warn and [place].   Recommendations  Antiplatelet/Anticoag Recommend uninterrupted dual antiplatelet therapy with Aspirin '81mg'$  daily and Clopidogrel '75mg'$  daily.   Indications  Aortic stenosis, severe [I35.0 (ICD-10-CM)]   Procedural Details  Technical Details Mr. Thaxton Pelley is a 88 year old gentleman who has known CAD, carotid disease, status post carotid right endarterectomy, and has developed progressive aortic stenosis.  Catheterization in 2009 showed 95% ostial left main stenosis and 99% ostial circumflex stenosis.  He underwent  CABG revascularization in 2009 by Dr. Cyndia Bent and had a LIMA placed to his LAD, SVG to left circumflex, and SVG to RCA.  Over the past several years the patient has developed progressive aortic stenosis and has remained relatively asymptomatic.  His most recent echo Doppler study from March 2023 now showed a mean gradient increased at 53, peak instantaneous gradient at 83 mmHg, and estimated aortic valve area 0.8 cm.  I referred him to Dr. Burt Knack for structural heart evaluation with possible TAVR and he now presents for definitive cardiac catheterization.  Patient arrived to the catheterization laboratory in the fasting state.  He was premedicated with Versed 1 mg and fentanyl 25 mcg.  His right femoral artery was punctured anteriorly and a 5 French sheath was inserted without difficulty.  His right femoral vein was punctured anteriorly and a 7 French venous sheath was inserted.  A 7 French Swan-Ganz catheter was advanced through the venous  sheath to the RA, RV, PA, pulmonary capillary wedge positions.  Oxygen saturation was obtained x2 in the pulmonary artery.  A JL 4 diagnostic catheter was used for selective angiography into the left coronary system.  She are for catheter was used which demonstrated ostial occlusion of the RCA.  The right catheter was used for selective angiography into the vein graft supplying the circumflex vessel.  A right bypass graft catheter was used for selective angiography into the vein graft supplying the distal RCA.  The RCA catheter was used for selective angiography into an atretic LIMA graft.  A minx closure device was used for right femoral artery hemostasis.  Hemostasis to the right femoral vein was done with manual pressure.  The patient tolerated the procedure well with plans for discharge later today. Estimated blood loss <50 mL.   During this procedure medications were administered to achieve and  maintain moderate conscious sedation while the patient's heart rate, blood pressure, and oxygen saturation were continuously monitored and I was present face-to-face 100% of this time.   Medications (Filter: Administrations occurring from 1324 to 1505 on 05/14/21) Heparin (Porcine) in NaCl 1000-0.9 UT/500ML-% SOLN (mL) Total volume:  1,000 mL  Date/Time Rate/Dose/Volume Action   05/14/21 1329 500 mL Given   1329 500 mL Given    fentaNYL (SUBLIMAZE) injection (mcg) Total dose:  25 mcg  Date/Time Rate/Dose/Volume Action   05/14/21 1351 25 mcg Given    midazolam (VERSED) injection (mg) Total dose:  1 mg  Date/Time Rate/Dose/Volume Action   05/14/21 1351 1 mg Given    lidocaine (PF) (XYLOCAINE) 1 % injection (mL) Total volume:  5 mL  Date/Time Rate/Dose/Volume Action   05/14/21 1352 5 mL Given    iohexol (OMNIPAQUE) 350 MG/ML injection (mL) Total volume:  120 mL  Date/Time Rate/Dose/Volume Action   05/14/21 1451 120 mL Given    Sedation Time  Sedation Time Physician-1: 54 minutes 38  seconds Contrast  Medication Name Total Dose  iohexol (OMNIPAQUE) 350 MG/ML injection 120 mL   Radiation/Fluoro  Fluoro time: 14.3 (min) DAP: 36644 (mGycm2) Cumulative Air Kerma: 034 (mGy) Complications  Complications documented before study signed (05/14/2021  7:42 PM)   No complications were associated with this study.  Documented by Bard Herbert, RN - 05/14/2021  2:56 PM     Coronary Findings  Diagnostic Dominance: Right Left Main  Ost LM lesion is 95% stenosed.    Left Circumflex  Ost Cx to Prox Cx lesion is 50% stenosed.    Right Coronary Artery  Ost RCA lesion is 100% stenosed.    Graft To Dist RCA  Prox Graft-1 lesion is 65% stenosed.  Prox Graft-2 lesion is 50% stenosed.    Graft To Mid Cx  Prox Graft lesion is 45% stenosed.    LIMA Graft To Mid LAD  And is small.    Intervention   No interventions have been documented.   Right Heart  Right Atrium RA: A-wave 11,; V wave 9; mean 11 RV: 44/9 PA: 35/11; mean 19 PW: A-wave 15; BP 19; mean 11  Aorta 152/60  Oxygen saturation in the PA 67% and 90 central aorta 96%.  Cardiac output by the Fick method 4.9 L/min with cardiac index 2.6 L/min/m  PVR 1.6 WU   Left Heart  Aortic Valve The aortic valve is calcified. There is restricted aortic valve motion.   Coronary Diagrams  Diagnostic Dominance: Right  Intervention  Implants     Vascular Products  Closure Mynx Control 110f-- [id number] Implanted  Inventory item: CLOSURE MB[facility] Scott [facility] White Continuing Care HospitalCONTROL 63F Model/Cat number: [id number] Serial number: [id number]: [employer] Lot number: [id number] identifier: [id number] Device identifier type: GS1    GUDID Information  Request status Successful    Brand name: MYNX CONTROL Version/Model: [id number] Company name: ASummerfieldsafety info as of 05/14/21: MR Safe  Contains dry or latex rubber: No    GMDN P.T. name: Wound hydrogel dressing, non-antimicrobial      As of 05/14/2021  Status: Implanted       [employer] Images   Show images for CARDIAC CATHETERIZATION Images on Long Term Storage   Show images for CVentura Sellers"JIm" Link to Procedure Log  Procedure Log    Hemo Data  Flowsheet Row Most Recent Value  Fick Cardiac Output 4.91 L/min  Fick Cardiac Output  Index 2.63 (L/min)/BSA  RA A Wave 11 mmHg  RA V Wave 9 mmHg  RA Mean 11 mmHg  RV Systolic Pressure 44 mmHg  RV Diastolic Pressure 0 mmHg  RV EDP 9 mmHg  PA Systolic Pressure 35 mmHg  PA Diastolic Pressure 11 mmHg  PA Mean 19 mmHg  PW A Wave 15 mmHg  PW V Wave 19 mmHg  PW Mean 11 mmHg  AO Systolic Pressure 562 mmHg  AO Diastolic Pressure 54 mmHg  AO Mean 88 mmHg  QP/QS 1  TPVR Index 7.21 HRUI    ADDENDUM REPORT: 06/25/2021 23:17   CLINICAL DATA:  68 -year-old male with severe aortic stenosis being evaluated for a TAVR procedure.   EXAM: Cardiac TAVR CT   TECHNIQUE: The patient was scanned on a [employer]. A 120 kV retrospective scan was triggered in the descending thoracic aorta at 111 HU's. Gantry rotation speed was 250 msecs and collimation was .6 mm. No beta blockade or nitro were given. The 3D data set was reconstructed in 5% intervals of the R-R cycle. Systolic and diastolic phases were analyzed on a dedicated work station using MPR, MIP and VRT modes. The patient received 80 cc of contrast.   FINDINGS: Aortic Root:   Aortic valve: Tricuspid   Aortic valve calcium score: 2081   Aortic annulus:   Diameter: 18m x 161m  Perimeter: 6864m Area: 344 mm^2   Calcifications: No calcifications   Coronary height: Min Left - 57m67max Left - 17mm97mn Right - 12mm 37mnotubular height: Left cusp - 22mm; 37mt cusp - 18mm; N66mronary cusp - 22mm   L23m(as measured 3 mm below the annulus):   Diameter: 26mm x 1672m Are36m03 mm^2   Calcifications: No calcifications   Aortic sinus width: Left cusp - 32mm; Right44mp - 32mm;  Nonco41mry cusp - 33mm   Sinotu59mr junction width: 28mm x 27mm   52mmum 2mroscopic Angle for Delivery: LAO 27 CRA 8   Cardiac:   Right atrium: Mild enlargement   Right ventricle: Normal size   Pulmonary arteries: Normal size   Pulmonary veins: Normal configuration   Left atrium: Mild enlargement   Left ventricle: Mild dilatation   Pericardium: Normal thickness   Coronary arteries: S/p left main stent. Atretic LIMA to LAD. Patent SVG to LCX and SVG to RCA   IMPRESSION: 1. Tricuspid aortic valve with severe calcifications (AV calcium score 2081)   2. Aortic annulus measures 25mm x 19mm in d61mter 7m perimeter 68mm and area 344 37m. No annular or LVOT calcifications. Annular measurement suitable for delivery of 23mm Edwards Sapien44malve   3. Low coronary heights, measuring 57mm to left main an75mmm to RCA   4. Opti4mFluoroscopic Angle for Delivery:  LAO 27 CRA 8     Electronically Signed   By: Christopher  Schumann Oswaldo Milian 23:17    Addended by Schumann, Christopher Donato Heinz0 PM   Study Result  Narrative & Impression  EXAM: OVER-READ INTERPRETATION  CT CHEST   The following report is a limited chest CT over-read performed by radiologist Dr. Leah Strickland of [facility] Glass[facility], [facility]/2023. This [place]-read does not include interpretation of cardiac or coronary anatomy or pathology. The cardiac CTA interpretation by the cardiologist is attached.   COMPARISON:  None Available.   FINDINGS: Extracardiac findings will be described separately under dictation for contemporaneously obtained CTA chest, abdomen and  pelvis.   IMPRESSION: Please see separate dictation for contemporaneously obtained CTA chest, abdomen and pelvis dated 06/25/2021 for full description of relevant extracardiac findings.   Electronically Signed: By: Yetta Glassman M.D. On: 06/25/2021 13:22      Narrative & Impression  CLINICAL  DATA:  Preop TAVR evaluation   EXAM: CT ANGIOGRAPHY CHEST, ABDOMEN AND PELVIS   TECHNIQUE: Non-contrast CT of the chest was initially obtained.   Multidetector CT imaging through the chest, abdomen and pelvis was performed using the standard protocol during bolus administration of intravenous contrast. Multiplanar reconstructed images and MIPs were obtained and reviewed to evaluate the vascular anatomy.   RADIATION DOSE REDUCTION: This exam was performed according to the departmental dose-optimization program which includes automated exposure control, adjustment of the mA and/or kV according to patient size and/or use of iterative reconstruction technique.   CONTRAST:  145m OMNIPAQUE IOHEXOL 350 MG/ML SOLN   COMPARISON:  CT of the abdomen dated February 05, 2016   FINDINGS: CTA CHEST FINDINGS   Cardiovascular: Heart is upper limits of normal in size. No pericardial effusion. Calcifications and thickening of the aortic valve. Mitral annular calcifications. Left main and three-vessel coronary artery calcifications with prior left main stent and CABG. Standard three-vessel aortic arch with no significant stenosis. Moderate atherosclerotic disease of the thoracic aorta.   Mediastinum/Nodes: Moderate hiatal hernia and patulous esophagus. Thyroid is unremarkable. No pathologically enlarged lymph nodes seen in the chest.   Lungs/Pleura: Central airways are patent. Bilateral mosaic attenuation. No consolidation, pleural effusion or pneumothorax.   Musculoskeletal: No chest wall abnormality. No acute or significant osseous findings.   CTA ABDOMEN AND PELVIS FINDINGS   Hepatobiliary: Suspicious liver lesions. Cholelithiasis with no evidence of gallbladder wall thickening. No biliary ductal dilation.   Pancreas: Pancreatic ductal dilatation. Nodule of the tail of the pancreas measuring up to 1.4 cm on series 10, image 92 which enhances same degree as the spleen, finding is  unchanged when compared with 2018 prior and likely a splenule.   Spleen: Normal in size without focal abnormality.   Adrenals/Urinary Tract: Bilateral adrenal glands are unremarkable. No hydronephrosis or nephrolithiasis. Bladder is unremarkable.   Stomach/Bowel: Stomach is unremarkable. Diverticulosis. Normal appendix. No evidence of bowel wall thickening, obstruction, or inflammatory change.   Vascular/lymphatic: Severe atherosclerotic disease of the abdominal aorta, most pronounced at the infrarenal abdominal aorta. Severe narrowing of the proximal celiac artery with poststenotic dilatation due to calcified and noncalcified plaque. Severe narrowing of the proximal SMA due to calcified and noncalcified plaque. Mild narrowing of the bilateral renal arteries due to calcified and noncalcified plaque. SMA is patent.   Reproductive: Prostate is unremarkable.   Other: Moderate bilateral fat containing inguinal hernias. No abdominopelvic ascites.   Musculoskeletal: Old left-sided rib fractures.No acute or significant osseous findings.   VASCULAR MEASUREMENTS PERTINENT TO TAVR:   AORTA:   Minimal Aortic Diameter -  7.7 mm   Severity of Aortic Calcification-severe   RIGHT PELVIS:   Right Common Iliac Artery -   Minimal Diameter-4.2 mm   Tortuosity-mild   Calcification-severe   Right External Iliac Artery -   Minimal Diameter-7.2 mm   Tortuosity-mild   Calcification-mild   Right Common Femoral Artery -   Minimal Diameter-5.9 mm   Tortuosity-none   Calcification-mild   LEFT PELVIS:   Left Common Iliac Artery -   Minimal Diameter-3.9 mm   Tortuosity-mild   Calcification-severe   Left External Iliac Artery -   Minimal Diameter-7.8 mm   Tortuosity-mild  Calcification-none   Left Common Femoral Artery -   Minimal Diameter-4.9 mm   Tortuosity-none   Calcification-mild   Review of the MIP images confirms the above findings.    IMPRESSION: Vascular:   1. Vascular findings and measurements pertinent to potential TAVR procedure, as detailed above. 2. Severe thickening calcification of the aortic valve, compatible with reported clinical history of severe aortic stenosis. 3. Severe aortoiliac atherosclerosis with severe narrowing at the origins of the celiac, SMA, and bilateral common iliac arteries to calcified and noncalcified plaque. 4. Linear filling defect of the right external iliac artery, likely due to focal dissection. 5. Left main and 3 vessel coronary artery disease status post CABG.   Nonvascular:   1. Bilateral mosaic attenuation, findings can be seen in the setting of air trapping or pulmonary hypertension. 2. Moderate hiatal hernia.     Electronically Signed   By: Yetta Glassman M.D.   On: 06/25/2021 13:21     Impression:  This 88 year old gentleman has stage D, severe, symptomatic aortic stenosis with New York Heart Association class II symptoms of exertional fatigue and shortness of breath consistent with chronic diastolic congestive heart failure.  I have personally reviewed his 2D echocardiogram, cardiac catheterization, and CTA images.  His echocardiogram shows a severely calcified aortic valve with thickening and restricted leaflets.  The mean gradient was 53 mmHg with a valve area of 0.8 cm consistent with severe aortic stenosis.  Left ventricular systolic function is normal.  Cardiac catheterization showed patent vein grafts to the obtuse marginal and right coronary system.  There was a high-grade eccentric ostial left main stenosis with an atretic LIMA to the LAD and therefore the left main stenosis was successfully treated with PCI and DES.  I agree that aortic valve replacement is indicated in this patient for relief of his symptoms and to prevent progressive left ventricular dysfunction.  I think TAVR would be the best option for treating him given his age and prior coronary artery  bypass graft surgery.  His gated cardiac CTA shows anatomy suitable for TAVR using a SAPIEN 3 valve.  His abdominal and pelvic CTA shows severe aortoiliac atherosclerosis although I think there is adequate pelvic vascular anatomy to allow transfemoral insertion.  His electrocardiogram shows sinus bradycardia with first-degree AV block and incomplete right bundle branch block which may increase his risk of high-grade AV block and need for PPM postop.  I discussed this in detail with him.  The patient and his family were counseled at length regarding treatment alternatives for management of severe symptomatic aortic stenosis. The risks and benefits of surgical intervention has been discussed in detail. Long-term prognosis with medical therapy was discussed. Alternative approaches such as conventional surgical aortic valve replacement, transcatheter aortic valve replacement, and palliative medical therapy were compared and contrasted at length. This discussion was placed in the context of the patient's own specific clinical presentation and past medical history. All of their questions have been addressed.   Following the decision to proceed with transcatheter aortic valve replacement, a discussion was held regarding what types of management strategies would be attempted intraoperatively in the event of life-threatening complications, including whether or not the patient would be considered a candidate for the use of cardiopulmonary bypass and/or conversion to open sternotomy for attempted surgical intervention.  He is not a candidate for emergent sternotomy to manage any intraoperative complications given his age of 89 and prior coronary bypass surgery.  The patient is aware of the fact that transient  use of cardiopulmonary bypass may be necessary. The patient has been advised of a variety of complications that might develop including but not limited to risks of death, stroke, paravalvular leak, aortic dissection  or other major vascular complications, aortic annulus rupture, device embolization, cardiac rupture or perforation, mitral regurgitation, acute myocardial infarction, arrhythmia, heart block or bradycardia requiring permanent pacemaker placement, congestive heart failure, respiratory failure, renal failure, pneumonia, infection, other late complications related to structural valve deterioration or migration, or other complications that might ultimately cause a temporary or permanent loss of functional independence or other long term morbidity. The patient provides full informed consent for the procedure as described and all questions were answered.      Plan:  He will be scheduled for transfemoral TAVR using a SAPIEN 3 valve on 08/13/2021.  I spent 60 minutes performing this consultation and > 50% of this time was spent face to face counseling and coordinating the care of this patient's severe symptomatic aortic stenosis.   Gaye Pollack, MD 08/07/2021

## 2021-08-08 NOTE — Progress Notes (Signed)
Surgical Instructions    Your procedure is scheduled on Tuesday July 18th.  Report to [facility] Main Entrance "A" at 5:30 A.M., then check in with the Admitting office.  Call this number if you have problems the morning of surgery:  [phone]   If you have any questions prior to your surgery date call [phone]: Open Monday-Friday 8am-4pm    Remember:  Do not eat or drink after midnight the night before your surgery     Take these medicines the morning of surgery with A SIP OF WATER:  Per surgeon's instruction: On the morning of surgery take only Levothyroxine with a sip of water.   STOP on 7/12 taking any Aleve, Naproxen, Ibuprofen, Motrin, Advil, Goody's, BC's, all herbal medications, fish oil, and all non-prescription vitamins.   Continue taking all other medications including Aspirin and Plavix (Clopidogrel) without change through the day before surgery.     As of today, STOP taking any Aspirin (unless otherwise instructed by your surgeon) Aleve, Naproxen, Ibuprofen, Motrin, Advil, Goody's, BC's, all herbal medications, fish oil, and all vitamins.           Do not wear jewelry  Do not wear lotions, powders, colognes, or deodorant. Do not shave 48 hours prior to surgery.  Men may shave face and neck. Do not bring valuables to the hospital. Do not wear nail polish, gel polish, artificial nails, or any other type of covering on natural nails (fingers and toes) If you have artificial nails or gel coating that need to be removed by a nail salon, please have this removed prior to surgery. Artificial nails or gel coating may interfere with anesthesia's ability to adequately monitor your vital signs.  [facility] is not responsible for any belongings or valuables. .   Do NOT Smoke (Tobacco/Vaping)  24 hours prior to your procedure  If you use a CPAP at night, you may bring your mask for your overnight stay.   Contacts, glasses, hearing aids, dentures or partials may not  be worn into surgery, please bring cases for these belongings   For patients admitted to the hospital, discharge time will be determined by your treatment team.   Patients discharged the day of surgery will not be allowed to drive home, and someone needs to stay with them for 24 hours.   SURGICAL WAITING ROOM VISITATION Patients having surgery or a procedure in a hospital may have two support people. Children under the age of 63 must have an adult with them who is not the patient. They may stay in the waiting area during the procedure and may switch out with other visitors. If the patient needs to stay at the hospital during part of their recovery, the visitor guidelines for inpatient rooms apply.  Please refer to the [facility] website for the visitor guidelines for Inpatients (after your surgery is over and you are in a regular room).       Special instructions:    Oral Hygiene is also important to reduce your risk of infection.  Remember - BRUSH YOUR TEETH THE MORNING OF SURGERY WITH YOUR REGULAR TOOTHPASTE   [place]- Preparing For Surgery  Before surgery, you can play an important role. Because skin is not sterile, your skin needs to be as free of germs as possible. You can reduce the number of germs on your skin by washing with CHG (chlorahexidine gluconate) Soap before surgery.  CHG is an antiseptic cleaner which kills germs and bonds with the skin to  continue killing germs even after washing.     Please do not use if you have an allergy to CHG or antibacterial soaps. If your skin becomes reddened/irritated stop using the CHG.  Do not shave (including legs and underarms) for at least 48 hours prior to first CHG shower. It is OK to shave your face.  Please follow these instructions carefully.     Shower the NIGHT BEFORE SURGERY and the MORNING OF SURGERY with CHG Soap.   If you chose to wash your hair, wash your hair first as usual with your normal shampoo. After you  shampoo, rinse your hair and body thoroughly to remove the shampoo.  Then [employer] and genitals (private parts) with your normal soap and rinse thoroughly to remove soap.  After that Use CHG Soap as you would any other liquid soap. You can apply CHG directly to the skin and wash gently with a scrungie or a clean washcloth.   Apply the CHG Soap to your body ONLY FROM THE NECK DOWN.  Do not use on open wounds or open sores. Avoid contact with your eyes, ears, mouth and genitals (private parts). Wash Face and genitals (private parts)  with your normal soap.   Wash thoroughly, paying special attention to the area where your surgery will be performed.  Thoroughly rinse your body with warm water from the neck down.  DO NOT shower/wash with your normal soap after using and rinsing off the CHG Soap.  Pat yourself dry with a CLEAN TOWEL.  Wear CLEAN PAJAMAS to bed the night before surgery  Place CLEAN SHEETS on your bed the night before your surgery  DO NOT SLEEP WITH PETS.   Day of Surgery:  Take a shower with CHG soap. Wear Clean/Comfortable clothing the morning of surgery Do not apply any deodorants/lotions.   Remember to brush your teeth WITH YOUR REGULAR TOOTHPASTE.    If you received a COVID test during your pre-op visit, it is requested that you wear a mask when out in public, stay away from anyone that may not be feeling well, and notify your surgeon if you develop symptoms. If you have been in contact with anyone that has tested positive in the last 10 days, please notify your surgeon.    Please read over the following fact sheets that you were given.

## 2021-08-09 ENCOUNTER — Telehealth: Payer: Self-pay | Admitting: Cardiology

## 2021-08-09 ENCOUNTER — Other Ambulatory Visit: Payer: Self-pay

## 2021-08-09 ENCOUNTER — Ambulatory Visit (HOSPITAL_COMMUNITY)
Admission: RE | Admit: 2021-08-09 | Discharge: 2021-08-09 | Disposition: A | Discharge status: Home / Self Care | Payer: PPO | Source: Ambulatory Visit | Attending: Cardiovascular Disease | Admitting: Cardiovascular Disease

## 2021-08-09 ENCOUNTER — Encounter (HOSPITAL_COMMUNITY)
Admission: RE | Admit: 2021-08-09 | Discharge: 2021-08-09 | Disposition: A | Discharge status: Home / Self Care | Payer: PPO | Source: Ambulatory Visit | Attending: Cardiovascular Disease | Admitting: Cardiovascular Disease

## 2021-08-09 ENCOUNTER — Encounter (HOSPITAL_COMMUNITY): Payer: Self-pay

## 2021-08-09 VITALS — BP 139/53 | HR 54 | Temp 98.4°F | Resp 18 | Ht 65.0 in | Wt 175.0 lb

## 2021-08-09 DIAGNOSIS — Z01818 Encounter for other preprocedural examination: Secondary | ICD-10-CM | POA: Insufficient documentation

## 2021-08-09 DIAGNOSIS — I350 Nonrheumatic aortic (valve) stenosis: Secondary | ICD-10-CM | POA: Insufficient documentation

## 2021-08-09 DIAGNOSIS — Z20822 Contact with and (suspected) exposure to covid-19: Secondary | ICD-10-CM | POA: Diagnosis not present

## 2021-08-09 LAB — URINALYSIS, ROUTINE W REFLEX MICROSCOPIC
Bacteria, UA: NONE SEEN
Bilirubin Urine: NEGATIVE
Glucose, UA: NEGATIVE mg/dL
Hgb urine dipstick: NEGATIVE
Ketones, ur: NEGATIVE mg/dL
Leukocytes,Ua: NEGATIVE
Nitrite: NEGATIVE
Protein, ur: 100 mg/dL — AB
Specific Gravity, Urine: 1.018 (ref 1.005–1.030)
pH: 6 (ref 5.0–8.0)

## 2021-08-09 LAB — COMPREHENSIVE METABOLIC PANEL
ALT: 14 U/L (ref 0–44)
AST: 17 U/L (ref 15–41)
Albumin: 3.6 g/dL (ref 3.5–5.0)
Alkaline Phosphatase: 63 U/L (ref 38–126)
Anion gap: 7 (ref 5–15)
BUN: 29 mg/dL — ABNORMAL HIGH (ref 8–23)
CO2: 26 mmol/L (ref 22–32)
Calcium: 8.6 mg/dL — ABNORMAL LOW (ref 8.9–10.3)
Chloride: 108 mmol/L (ref 98–111)
Creatinine, Ser: 1.99 mg/dL — ABNORMAL HIGH (ref 0.61–1.24)
GFR, Estimated: 32 mL/min — ABNORMAL LOW (ref >60)
Glucose, Bld: 83 mg/dL (ref 70–99)
Potassium: 4.6 mmol/L (ref 3.5–5.1)
Sodium: 141 mmol/L (ref 135–145)
Total Bilirubin: 0.5 mg/dL (ref 0.3–1.2)
Total Protein: 6.9 g/dL (ref 6.5–8.1)

## 2021-08-09 LAB — TYPE AND SCREEN
ABO/RH(D): O POS
Antibody Screen: NEGATIVE

## 2021-08-09 LAB — CBC
HCT: 36.4 % — ABNORMAL LOW (ref 39.0–52.0)
Hemoglobin: 11.9 g/dL — ABNORMAL LOW (ref 13.0–17.0)
MCH: 33 pg (ref 26.0–34.0)
MCHC: 32.7 g/dL (ref 30.0–36.0)
MCV: 100.8 fL — ABNORMAL HIGH (ref 80.0–100.0)
Platelets: 175 10*3/uL (ref 150–400)
RBC: 3.61 MIL/uL — ABNORMAL LOW (ref 4.22–5.81)
RDW: 12.8 % (ref 11.5–15.5)
WBC: 6.9 10*3/uL (ref 4.0–10.5)
nRBC: 0 % (ref 0.0–0.2)

## 2021-08-09 LAB — PROTIME-INR
INR: 1 (ref 0.8–1.2)
Prothrombin Time: 13.3 seconds (ref 11.4–15.2)

## 2021-08-09 LAB — SURGICAL PCR SCREEN
MRSA, PCR: NEGATIVE
Staphylococcus aureus: NEGATIVE

## 2021-08-09 NOTE — Telephone Encounter (Signed)
  [facility]   MULTIDISCIPLINARY HEART VALVE TEAM   Reviewing over pre TAVR lab work which shows a mildly elevated creatinine above his baseline at 1.99. He is on no medications which may cause increased creatinine. I have attempted to call the patient to reiterate the importance of hydration in the next several days prior to TAVR. LVM to call back.    Kathyrn Drown NP-C Structural Heart Team  Pager: [phone] Phone: [phone]

## 2021-08-09 NOTE — Progress Notes (Signed)
PCP - Cathi Roan, PA-C Cardiologist - Dr. Shelva Majestic  PPM/ICD - denies   Chest x-ray - 08/09/21 EKG - 08/09/21 Stress Test - 06/06/14 ECHO - 04/08/21 Cardiac Cath - 06/05/21  Sleep Study - denies   DM- denies  ASA/Blood Thinner Instructions: Continue ASA and Plavix thru day before surgery per order.   ERAS Protcol - no, NPO   COVID TEST- 08/09/21   Anesthesia review: yes, cardiac hx  Patient denies shortness of breath, fever, cough and chest pain at PAT appointment   All instructions explained to the patient, with a verbal understanding of the material. Patient agrees to go over the instructions while at home for a better understanding. Patient also instructed to wear a mask in public after being tested for COVID-19. The opportunity to ask questions was provided.

## 2021-08-10 LAB — SARS CORONAVIRUS 2 (TAT 6-24 HRS): SARS Coronavirus 2: NEGATIVE

## 2021-08-12 NOTE — H&P (Signed)
[address] [address]       [name] [id number]             [name]      Cardiothoracic Surgery Admission History and Physical   PCP is Jenel Lucks, PA-C Referring Provider is Sherren Mocha, MD Primary Cardiologist is Shelva Majestic, MD   Reason for admission:  Severe aortic stenosis   HPI:   The patient is an 88 year old gentleman with a history of hypertension, hyperlipidemia hypothyroidism, carotid artery disease, coronary artery disease status post CABG x3 by me in 2009 and aortic stenosis who has been referred for consideration of TAVR.  His coronary artery bypass grafts included a LIMA to the LAD, saphenous vein graft to the obtuse marginal, and saphenous vein graft to the RCA.  In 2017 he underwent a left VATS/decortication for empyema by Dr. Darcey Nora.  He then underwent a right carotid endarterectomy by Dr. [name] in 2018.  He has had moderate aortic stenosis in the past.  His most recent echo in February 2023 showed an increase in the mean gradient to 53 mmHg with a peak gradient of 83 mmHg.  Valve area was 0.8 cm.  Left ventricular ejection fraction remains normal at 60 to 65% with mild LVH.  He has done well over the years but now reports onset of exertional fatigue and shortness of breath with moderate activities such as working in his yard.  He says that he remains active but it takes him longer to do the same amount of work.  He has had no chest pain or pressure.  He denies dizziness and syncope.  He has had no orthopnea or peripheral edema.   He underwent cardiac catheterization on 05/14/2021 showing 95% eccentric ostial left main stenosis with an atretic LIMA to the distal LAD.  There is ostial occlusion of the RCA.  There was a patent vein graft supplying the left circumflex but had 40 to 50% proximal stenosis and a patent vein graft supplying the RCA with filling of the RCA retrograde to the ostium.  He subsequently underwent PCI of the ostial  left main stenosis with a DES on 06/05/2021.  He says that he has not noticed much difference in his exertional symptoms since his PCI but never thought much of his symptoms before anyway.         Past Medical History:  Diagnosis Date   CAD (coronary artery disease)     Heart murmur     History of hiatal hernia     Hypertension     Hypothyroidism     Pneumonia 12/2015    hx   S/P CABG x 3 12/17/07    LIMA to LAD,SVG to left C           Past Surgical History:  Procedure Laterality Date   CORONARY ARTERY BYPASS GRAFT   12/17/07    LIMA to LAD,vein to obtuse marginal,vein to RCA   CORONARY STENT INTERVENTION N/A 06/05/2021    Procedure: CORONARY STENT INTERVENTION;  Surgeon: Sherren Mocha, MD;  Location: [place] [facility];  Service: Cardiovascular;  Laterality: N/A;   EMPYEMA DRAINAGE Left 01/11/2016    Procedure: EMPYEMA DRAINAGE;  Surgeon: Ivin Poot, MD;  Location: [place];  Service: Thoracic;  Laterality: Left;   ENDARTERECTOMY Right 03/26/2016    Procedure: RIGHT CAROTID ENDARTERECTOMY;  Surgeon: Serafina Mitchell, MD;  Location: [place];  Service: Vascular;  Laterality: Right;   PATCH ANGIOPLASTY Right 03/26/2016  Procedure: PATCH ANGIOPLASTY USING Rueben Bash BIOLOGIC PATCH;  Surgeon: Serafina Mitchell, MD;  Location: [place];  Service: Vascular;  Laterality: Right;   RIGHT HEART CATH AND CORONARY/GRAFT ANGIOGRAPHY N/A 05/14/2021    Procedure: RIGHT HEART CATH AND CORONARY/GRAFT ANGIOGRAPHY;  Surgeon: Troy Sine, MD;  Location: [place] [facility];  Service: Cardiovascular;  Laterality: N/A;   VIDEO ASSISTED THORACOSCOPY (VATS)/DECORTICATION Left 01/11/2016    Procedure: VIDEO ASSISTED THORACOSCOPY (VATS)/DECORTICATION;  Surgeon: Ivin Poot, MD;  Location: [facility];  Service: Thoracic;  Laterality: Left;           Family History  Problem Relation Age of Onset   Heart attack Father        Social History         Socioeconomic History   Marital status: Married       Spouse name: Not on file   Number of children: Not on file   Years of education: Not on file   Highest education level: Not on file  Occupational History   Not on file  Tobacco Use   Smoking status: Former      Types: Cigarettes      Quit date: 01/28/1971      Years since quitting: 50.5      Passive exposure: Never   Smokeless tobacco: Never  Vaping Use   Vaping Use: Never used  Substance and Sexual Activity   Alcohol use: Yes      Comment: socially   Drug use: No   Sexual activity: Not on file  Other Topics Concern   Not on file  Social History Narrative   Not on file    Social Determinants of Health    Financial Resource Strain: Not on file  Food Insecurity: Not on file  Transportation Needs: Not on file  Physical Activity: Not on file  Stress: Not on file  Social Connections: Not on file  Intimate Partner Violence: Not on file             Prior to Admission medications   Medication Sig Start Date End Date Taking? Authorizing Provider  amLODipine (NORVASC) 5 MG tablet Take 1 tablet by mouth daily 08/23/20   Yes Troy Sine, MD  aspirin EC 81 MG tablet Take 81 mg by mouth at bedtime.      Yes [provider]  Cholecalciferol (VITAMIN D) 2000 units tablet Take 2,000 Units by mouth at bedtime.     Yes [provider]  clopidogrel (PLAVIX) 75 MG tablet Take 1 tablet (75 mg total) by mouth daily. 06/06/21   Yes Eileen Stanford, PA-C  Cyanocobalamin (B-12) 2000 MCG TABS Take 2,000 mcg by mouth daily. 05/28/20   Yes [provider]  fexofenadine (ALLEGRA) 180 MG tablet Take 180 mg by mouth daily.     Yes [provider]  folic acid (FOLVITE) 1 MG tablet Take 1 tablet (1 mg total) by mouth daily. 10/31/20   Yes Troy Sine, MD  levothyroxine (SYNTHROID, LEVOTHROID) 112 MCG tablet Take 112 mcg by mouth daily before breakfast.     Yes [provider]  metoprolol succinate (TOPROL-XL) 50 MG 24 hr tablet Take 1 tablet (50 mg  total) by mouth daily. Take with or immediately following a meal. 10/31/20   Yes Troy Sine, MD  mupirocin cream (BACTROBAN) 2 % Apply 1 application topically daily as needed (after skin treatments).  01/23/16   Yes [provider]  Omega-3 1400 MG CAPS Take  1,400 mg by mouth daily.     Yes [provider]  rosuvastatin (CRESTOR) 40 MG tablet Take 1 tablet by mouth every day 02/21/21   Yes Troy Sine, MD  terbinafine (LAMISIL) 1 % cream Apply 1 application topically daily as needed (rash).      Yes [provider]            Current Outpatient Medications  Medication Sig Dispense Refill   amLODipine (NORVASC) 5 MG tablet Take 1 tablet by mouth daily 90 tablet 3   aspirin EC 81 MG tablet Take 81 mg by mouth at bedtime.        Cholecalciferol (VITAMIN D) 2000 units tablet Take 2,000 Units by mouth at bedtime.       clopidogrel (PLAVIX) 75 MG tablet Take 1 tablet (75 mg total) by mouth daily. 90 tablet 3   Cyanocobalamin (B-12) 2000 MCG TABS Take 2,000 mcg by mouth daily.       fexofenadine (ALLEGRA) 180 MG tablet Take 180 mg by mouth daily.       folic acid (FOLVITE) 1 MG tablet Take 1 tablet (1 mg total) by mouth daily. 90 tablet 3   levothyroxine (SYNTHROID, LEVOTHROID) 112 MCG tablet Take 112 mcg by mouth daily before breakfast.       metoprolol succinate (TOPROL-XL) 50 MG 24 hr tablet Take 1 tablet (50 mg total) by mouth daily. Take with or immediately following a meal. 90 tablet 3   mupirocin cream (BACTROBAN) 2 % Apply 1 application topically daily as needed (after skin treatments).        Omega-3 1400 MG CAPS Take 1,400 mg by mouth daily.       rosuvastatin (CRESTOR) 40 MG tablet Take 1 tablet by mouth every day 90 tablet 2   terbinafine (LAMISIL) 1 % cream Apply 1 application topically daily as needed (rash).         No current facility-administered medications for this visit.      No Known Allergies       Review of Systems:                General:                      normal appetite, + decreased energy, + weight gain, no weight loss, no fever             Cardiac:                       no chest pain with exertion, no chest pain at rest, +SOB with moderate exertion, no resting SOB, no PND, no orthopnea, no palpitations, no arrhythmia, no atrial fibrillation, no LE edema, no dizzy spells, no syncope             Respiratory:                 + exertional shortness of breath, no home oxygen, no productive cough, no dry cough, no bronchitis, no wheezing, no hemoptysis, no asthma, no pain with inspiration or cough, no sleep apnea, no CPAP at night             GI:                               no difficulty swallowing, no reflux, no frequent heartburn, + hiatal hernia, no abdominal pain, no constipation, no diarrhea,  no hematochezia, no hematemesis, no melena             GU:                              no dysuria,  no frequency, no urinary tract infection, no hematuria, no enlarged prostate, no kidney stones, no kidney disease             Vascular:                     no pain suggestive of claudication, no pain in feet, no leg cramps, no varicose veins, no DVT, no non-healing foot ulcer             Neuro:                         no stroke, no TIA's, no seizures, no headaches, no temporary blindness one eye,  no slurred speech, no peripheral neuropathy, no chronic pain, no instability of gait, no memory/cognitive dysfunction             Musculoskeletal:         no arthritis, no joint swelling, no myalgias, no difficulty walking, normal mobility              Skin:                            no rash, no itching, no skin infections, no pressure sores or ulcerations             Psych:                         no anxiety, no depression, no nervousness, no unusual recent stress             Eyes:                           no blurry vision, no floaters, no recent vision changes, last saw dentist this year             Hematologic:               no easy  bruising, no abnormal bleeding, no clotting disorder, no frequent epistaxis             Endocrine:                   no diabetes, does not check CBG's at home                            Physical Exam:               BP (!) 152/68 (BP Location: Left Arm, Patient Position: Sitting)   Pulse 60   Resp 18   Ht '5\' 5"'$  (1.651 m)   Wt 172 lb (78 kg)   SpO2 91% Comment: RA  BMI 28.62 kg/m              General:                      Elderly,  well-appearing             HEENT:  Unremarkable, NCAT, PERLA, EOMI             Neck:                           no JVD, no bruits, no adenopathy              Chest:                          clear to auscultation, symmetrical breath sounds, no wheezes, no rhonchi              CV:                              RRR, 3/6 systolic murmur RSB, no diastolic murmur             Abdomen:                    soft, non-tender, no masses              Extremities:                 warm, well-perfused, pulses palpable at ankle, no lower extremity edema             Rectal/GU                   Deferred             Neuro:                         Grossly non-focal and symmetrical throughout             Skin:                            Clean and dry, no rashes, no breakdown   Diagnostic Tests:     ECHOCARDIOGRAM REPORT         Patient Name:   [name]   Date of Exam: 03/19/2021  Medical Rec #:  [id number]     Height:       66.0 in  Accession #:    [id number]    Weight:       174.8 lb  Date of Birth:  11/10/1933      BSA:          1.889 m  Patient Age:    44 years      BP:           153/67 mmHg  Patient Gender: M             HR:           53 bpm.  Exam Location:  [address]   Procedure: 2D Echo, Cardiac Doppler, Color Doppler and Strain Analysis   Indications:    I35.0 Aortic Stenosis     History:        Patient has prior history of Echocardiogram examinations,  most                  recent 09/06/2020. CAD, Prior CABG, Signs/Symptoms:Murmur;  Risk                   Factors:Hypertension and HLD.     Sonographer:    Marygrace Drought RCS  Referring Phys:  [place]     1. Left ventricular ejection fraction, by estimation, is 60 to 65%. The  left ventricle has normal function. The left ventricle has no regional  wall motion abnormalities. There is mild left ventricular hypertrophy.  Left ventricular diastolic parameters  were normal.   2. Right ventricular systolic function is normal. The right ventricular  size is normal. There is normal pulmonary artery systolic pressure.   3. Left atrial size was moderately dilated.   4. The mitral valve is degenerative. Trivial mitral valve regurgitation.  No evidence of mitral stenosis. Moderate mitral annular calcification.   5. Gradients have increased and AVA decreased since echo done 8.11/22.  The aortic valve is tricuspid. There is severe calcifcation of the aortic  valve. There is severe thickening of the aortic valve. Aortic valve  regurgitation is mild. Severe aortic  valve stenosis.   6. The inferior vena cava is normal in size with greater than 50%  respiratory variability, suggesting right atrial pressure of 3 mmHg.   FINDINGS   Left Ventricle: Left ventricular ejection fraction, by estimation, is 60  to 65%. The left ventricle has normal function. The left ventricle has no  regional wall motion abnormalities. The left ventricular internal cavity  size was normal in size. There is   mild left ventricular hypertrophy. Left ventricular diastolic parameters  were normal.   Right Ventricle: The right ventricular size is normal. No increase in  right ventricular wall thickness. Right ventricular systolic function is  normal. There is normal pulmonary artery systolic pressure. The tricuspid  regurgitant velocity is 2.78 m/s, and   with an assumed right atrial pressure of 3 mmHg, the estimated right  ventricular systolic pressure is 10.1 mmHg.   Left Atrium:  Left atrial size was moderately dilated.   Right Atrium: Right atrial size was normal in size.   Pericardium: There is no evidence of pericardial effusion.   Mitral Valve: The mitral valve is degenerative in appearance. There is  moderate thickening of the mitral valve leaflet(s). There is moderate  calcification of the mitral valve leaflet(s). Moderate mitral annular  calcification. Trivial mitral valve  regurgitation. No evidence of mitral valve stenosis.   Tricuspid Valve: The tricuspid valve is normal in structure. Tricuspid  valve regurgitation is mild . No evidence of tricuspid stenosis.   Aortic Valve: Gradients have increased and AVA decreased since echo done  8.11/22. The aortic valve is tricuspid. There is severe calcifcation of  the aortic valve. There is severe thickening of the aortic valve. Aortic  valve regurgitation is mild. Aortic  regurgitation PHT measures 504 msec. Severe aortic stenosis is present.  Aortic valve mean gradient measures 53.0 mmHg. Aortic valve peak gradient  measures 83.2 mmHg. Aortic valve area, by VTI measures 0.80 cm.   Pulmonic Valve: The pulmonic valve was normal in structure. Pulmonic valve  regurgitation is not visualized. No evidence of pulmonic stenosis.   Aorta: The aortic root is normal in size and structure.   Venous: The inferior vena cava is normal in size with greater than 50%  respiratory variability, suggesting right atrial pressure of 3 mmHg.   IAS/Shunts: No atrial level shunt detected by color flow Doppler.      LEFT VENTRICLE  PLAX 2D  LVIDd:         4.50 cm   Diastology  LVIDs:         2.60 cm   LV e' medial:    5.00  cm/s  LV PW:         0.90 cm   LV E/e' medial:  22.4  LV IVS:        1.30 cm   LV e' lateral:   9.03 cm/s  LVOT diam:     2.00 cm   LV E/e' lateral: 12.4  LV SV:         103  LV SV Index:   55        2D Longitudinal Strain  LVOT Area:     3.14 cm  2D Strain GLS (A2C):   -16.6 %                            2D Strain GLS (A3C):   -18.8 %                           2D Strain GLS (A4C):   -18.1 %                           2D Strain GLS Avg:     -17.8 %   RIGHT VENTRICLE  RV Basal diam:  4.15 cm  RV Mid diam:    3.30 cm  RV S prime:     10.40 cm/s  TAPSE (M-mode): 2.6 cm  RVSP:           33.9 mmHg   LEFT ATRIUM             Index        RIGHT ATRIUM           Index  LA diam:        4.40 cm 2.33 cm/m   RA Pressure: 3.00 mmHg  LA Vol (A2C):   67.3 ml 35.64 ml/m  RA Area:     16.90 cm  LA Vol (A4C):   69.2 ml 36.64 ml/m  RA Volume:   43.10 ml  22.82 ml/m  LA Biplane Vol: 66.7 ml 35.32 ml/m   AORTIC VALVE  AV Area (Vmax):    0.75 cm  AV Area (Vmean):   0.71 cm  AV Area (VTI):     0.80 cm  AV Vmax:           456.00 cm/s  AV Vmean:          352.000 cm/s  AV VTI:            1.290 m  AV Peak Grad:      83.2 mmHg  AV Mean Grad:      53.0 mmHg  LVOT Vmax:         109.00 cm/s  LVOT Vmean:        79.600 cm/s  LVOT VTI:          0.329 m  LVOT/AV VTI ratio: 0.26  AI PHT:            504 msec     AORTA  Ao Root diam: 3.10 cm  Ao Asc diam:  3.50 cm   MITRAL VALVE                TRICUSPID VALVE  MV Area (PHT):              TR Peak grad:   30.9 mmHg  MV Decel Time:  TR Vmax:        278.00 cm/s  MV E velocity: 112.00 cm/s  Estimated RAP:  3.00 mmHg  MV A velocity: 101.00 cm/s  RVSP:           33.9 mmHg  MV E/A ratio:  1.11                              SHUNTS                              Systemic VTI:  0.33 m                              Systemic Diam: 2.00 cm   Jenkins Rouge MD  Electronically signed by Jenkins Rouge MD  Signature Date/Time: 03/19/2021/10:40:19 AM         Final       Physicians   Panel Physicians Referring Physician Case Authorizing Physician  Troy Sine, MD (Primary)        Procedures   RIGHT HEART CATH AND CORONARY/GRAFT ANGIOGRAPHY    Conclusion       Ost LM lesion is 95% stenosed.   Ost RCA lesion is 100% stenosed.   Prox Graft-1  lesion is 65% stenosed.   Prox Graft-2 lesion is 50% stenosed.   Prox Graft lesion is 45% stenosed.   Ost Cx to Prox Cx lesion is 50% stenosed.   and is small.   Very mildly elevated right heart pressures.   Severely calcified aortic valve with echocardiographic documentation of a mean gradient of 53 mmHg, and peak gradient 83 mmHg.     Severe native CAD with 95% eccentric ostial left main stenosis; 40% first diagonal stenosis of the LAD; 50% proximal circumflex stenosis; and ostial occlusion of the RCA.   Atretic very small caliber LIMA graft supplying the mid distal LAD.   Patent vein graft supplying the left circumflex vessel with 40 - 50% proximal third stenosis.   Patent vein graft supplying the RCA with filling of the RCA retrograde to the ostium.  There is a valve in the body of the graft with narrowing of 65 proximal and 50% distal to the probable valve.   RECOMMENDATION: The patient has a scheduled appointment to see Dr. Gilford Raid as part of the structural heart team evaluation.  He  will undergo follow-up CT imaging per TAVR protocol.  Ultimate decision regarding SAVR with revascularization versus TAVR and left main PCI per Drs. Justyn Langham and [place].   Recommendations   Antiplatelet/Anticoag Recommend uninterrupted dual antiplatelet therapy with Aspirin '81mg'$  daily and Clopidogrel '75mg'$  daily.    Indications   Aortic stenosis, severe [I35.0 (ICD-10-CM)]    Procedural Details   Technical Details Mr. Aizen Duval is a 88 year old gentleman who has known CAD, carotid disease, status post carotid right endarterectomy, and has developed progressive aortic stenosis.  Catheterization in 2009 showed 95% ostial left main stenosis and 99% ostial circumflex stenosis.  He underwent  CABG revascularization in 2009 by Dr. Cyndia Bent and had a LIMA placed to his LAD, SVG to left circumflex, and SVG to RCA.  Over the past several years the patient has developed progressive aortic stenosis and has  remained relatively asymptomatic.  His most recent echo Doppler study from March 2023 now showed a mean gradient increased at 53, peak instantaneous  gradient at 83 mmHg, and estimated aortic valve area 0.8 cm.  I referred him to Dr. Burt Knack for structural heart evaluation with possible TAVR and he now presents for definitive cardiac catheterization.  Patient arrived to the catheterization laboratory in the fasting state.  He was premedicated with Versed 1 mg and fentanyl 25 mcg.  His right femoral artery was punctured anteriorly and a 5 French sheath was inserted without difficulty.  His right femoral vein was punctured anteriorly and a 7 French venous sheath was inserted.  A 7 French Swan-Ganz catheter was advanced through the venous sheath to the RA, RV, PA, pulmonary capillary wedge positions.  Oxygen saturation was obtained x2 in the pulmonary artery.  A JL 4 diagnostic catheter was used for selective angiography into the left coronary system.  She are for catheter was used which demonstrated ostial occlusion of the RCA.  The right catheter was used for selective angiography into the vein graft supplying the circumflex vessel.  A right bypass graft catheter was used for selective angiography into the vein graft supplying the distal RCA.  The RCA catheter was used for selective angiography into an atretic LIMA graft.  A minx closure device was used for right femoral artery hemostasis.  Hemostasis to the right femoral vein was done with manual pressure.  The patient tolerated the procedure well with plans for discharge later today. Estimated blood loss <50 mL.   During this procedure medications were administered to achieve and maintain moderate conscious sedation while the patient's heart rate, blood pressure, and oxygen saturation were continuously monitored and I was present face-to-face 100% of this time.    Medications (Filter: Administrations occurring from 1324 to 1505 on 05/14/21) Heparin  (Porcine) in NaCl 1000-0.9 UT/500ML-% SOLN (mL) Total volume:  1,000 mL  Date/Time Rate/Dose/Volume Action    05/14/21 1329 500 mL Given    1329 500 mL Given      fentaNYL (SUBLIMAZE) injection (mcg) Total dose:  25 mcg  Date/Time Rate/Dose/Volume Action    05/14/21 1351 25 mcg Given      midazolam (VERSED) injection (mg) Total dose:  1 mg  Date/Time Rate/Dose/Volume Action    05/14/21 1351 1 mg Given      lidocaine (PF) (XYLOCAINE) 1 % injection (mL) Total volume:  5 mL  Date/Time Rate/Dose/Volume Action    05/14/21 1352 5 mL Given      iohexol (OMNIPAQUE) 350 MG/ML injection (mL) Total volume:  120 mL  Date/Time Rate/Dose/Volume Action    05/14/21 1451 120 mL Given      Sedation Time   Sedation Time Physician-1: 54 minutes 38 seconds Contrast   Medication Name Total Dose  iohexol (OMNIPAQUE) 350 MG/ML injection 120 mL    Radiation/Fluoro   Fluoro time: 14.3 (min) DAP: 82993 (mGycm2) Cumulative Air Kerma: 716 (mGy) Complications      Complications documented before study signed (05/14/2021  9:67 PM)     No complications were associated with this study.  Documented by Bard Herbert, RN - 05/14/2021  2:56 PM      Coronary Findings   Diagnostic Dominance: Right Left Main  Ost LM lesion is 95% stenosed.    Left Circumflex  Ost Cx to Prox Cx lesion is 50% stenosed.    Right Coronary Artery  Ost RCA lesion is 100% stenosed.    Graft To Dist RCA  Prox Graft-1 lesion is 65% stenosed.  Prox Graft-2 lesion is 50% stenosed.    Graft To Mid Cx  Prox  Graft lesion is 45% stenosed.    LIMA Graft To Mid LAD  And is small.    Intervention    No interventions have been documented.    Right Heart   Right Atrium RA: A-wave 11,; V wave 9; mean 11 RV: 44/9 PA: 35/11; mean 19 PW: A-wave 15; BP 19; mean 11  Aorta 152/60  Oxygen saturation in the PA 67% and 90 central aorta 96%.  Cardiac output by the Fick method 4.9 L/min with cardiac index 2.6  L/min/m  PVR 1.6 WU    Left Heart   Aortic Valve The aortic valve is calcified. There is restricted aortic valve motion.    Coronary Diagrams   Diagnostic Dominance: Right  Intervention   Implants         Vascular Products   Closure Mynx Control 23f-- [id number] Implanted  Inventory item: CLOSURE [facility]CONTROL 24F Model/Cat number: [id number] Serial number: [id number]: [employer] Lot number: [id number] identifier: [id number] Device identifier type: GS1      GUDID Information   Request status Successful      Brand name: MYNX CONTROL Version/Model: [id number] Company name: [employer] MRI safety info as of 05/14/21: MR Safe  Contains dry or latex rubber: No      GMDN P.T. name: Wound hydrogel dressing, non-antimicrobial        As of 05/14/2021   Status: Implanted          [employer] Images    Show images for CARDIAC CATHETERIZATION Images on Long Term Storage    Show images for [place], [place]"JIm" Link to Procedure Log   Procedure Log    Hemo Data   Flowsheet Row Most Recent Value  Fick Cardiac Output 4.91 L/min  Fick Cardiac Output Index 2.63 (L/min)/BSA  RA A Wave 11 mmHg  RA V Wave 9 mmHg  RA Mean 11 mmHg  RV Systolic Pressure 44 mmHg  RV Diastolic Pressure 0 mmHg  RV EDP 9 mmHg  PA Systolic Pressure 35 mmHg  PA Diastolic Pressure 11 mmHg  PA Mean 19 mmHg  PW A Wave 15 mmHg  PW V Wave 19 mmHg  PW Mean 11 mmHg  AO Systolic Pressure 1106mmHg  AO Diastolic Pressure 54 mmHg  AO Mean 88 mmHg  QP/QS 1  TPVR Index 7.21 HRUI      ADDENDUM REPORT: 06/25/2021 23:17   CLINICAL DATA:  89-year-old male with severe aortic stenosis being evaluated for a TAVR procedure.   EXAM: Cardiac TAVR CT   TECHNIQUE: The patient was scanned on a [employer] A 120 kV retrospective scan was triggered in the descending thoracic aorta at 111 HU's. Gantry rotation speed was 250 msecs and collimation was .6 mm. No  beta blockade or nitro were given. The 3D data set was reconstructed in 5% intervals of the R-R cycle. Systolic and diastolic phases were analyzed on a dedicated work station using MPR, MIP and VRT modes. The patient received 80 cc of contrast.   FINDINGS: Aortic Root:   Aortic valve: Tricuspid   Aortic valve calcium score: 2081   Aortic annulus:   Diameter: 261mx 1936m Perimeter: 26m35mArea: 344 mm^2   Calcifications: No calcifications   Coronary height: Min Left - 11mm50mx Left - 17mm;61m Right - 12mm  1motubular height: Left cusp - 22mm; R58m cusp - 18mm; No41monary cusp - 22mm26m  LVOT (as measured 3 mm below the annulus):   Diameter: 80m x 128m  Area: 303 mm^2   Calcifications: No calcifications   Aortic sinus width: Left cusp - 3257mRight cusp - 27m63moncoronary cusp - 33mm51minotubular junction width: 28mm 75mmm  21mimum Fluoroscopic Angle for Delivery: LAO 27 CRA 8   Cardiac:   Right atrium: Mild enlargement   Right ventricle: Normal size   Pulmonary arteries: Normal size   Pulmonary veins: Normal configuration   Left atrium: Mild enlargement   Left ventricle: Mild dilatation   Pericardium: Normal thickness   Coronary arteries: S/p left main stent. Atretic LIMA to LAD. Patent SVG to LCX and SVG to RCA   IMPRESSION: 1. Tricuspid aortic valve with severe calcifications (AV calcium score 2081)   2. Aortic annulus measures 25mm x 94m in 78meter with perimeter 68mm and 38m 344 mm^2. No annular or LVOT calcifications. Annular measurement suitable for delivery of 23mm Edwar39mapien 3 valve   3. Low coronary heights, measuring 11mm to lef4min and 12mm to RCA 74m Optimum Fluoroscopic Angle for Delivery:  LAO 27 CRA 8     Electronically Signed   By: Christopher  Oswaldo Milian5/30/2023 23:17    Addended by Schumann, ChrDonato Heinz2023 11:20 PM    Study Result   Narrative & Impression   EXAM: OVER-READ INTERPRETATION  CT CHEST   The following report is a limited chest CT over-read performed by radiologist Dr. [facility]Yetta [facility]. This over-read does not include interpretation of cardiac or coronary anatomy or pathology. The cardiac CTA interpretation by the cardiologist is attached.   COMPARISON:  None Available.   FINDINGS: Extracardiac findings will be described separately under dictation for contemporaneously obtained CTA chest, abdomen and pelvis.   IMPRESSION: Please see separate dictation for contemporaneously obtained CTA chest, abdomen and pelvis dated 06/25/2021 for full description of relevant extracardiac findings.   Electronically Signed: By: Leah  StricklYetta Glassman30/2023 13:22        Narrative & Impression  CLINICAL DATA:  Preop TAVR evaluation   EXAM: CT ANGIOGRAPHY CHEST, ABDOMEN AND PELVIS   TECHNIQUE: Non-contrast CT of the chest was initially obtained.   Multidetector CT imaging through the chest, abdomen and pelvis was performed using the standard protocol during bolus administration of intravenous contrast. Multiplanar reconstructed images and MIPs were obtained and reviewed to evaluate the vascular anatomy.   RADIATION DOSE REDUCTION: This exam was performed according to the departmental dose-optimization program which includes automated exposure control, adjustment of the mA and/or kV according to patient size and/or use of iterative reconstruction technique.   CONTRAST:  100mL [id number] 350 MG/ML SOLN   COMPARISON:  CT of the abdomen dated February 05, 2016   FINDINGS: CTA CHEST FINDINGS   Cardiovascular: Heart is upper limits of normal in size. No pericardial effusion. Calcifications and thickening of the aortic valve. Mitral annular calcifications. Left main and three-vessel coronary artery calcifications with prior left main stent and CABG. Standard three-vessel  aortic arch with no significant stenosis. Moderate atherosclerotic disease of the thoracic aorta.   Mediastinum/Nodes: Moderate hiatal hernia and patulous esophagus. Thyroid is unremarkable. No pathologically enlarged lymph nodes seen in the chest.   Lungs/Pleura: Central airways are patent. Bilateral mosaic attenuation. No consolidation, pleural effusion or pneumothorax.   Musculoskeletal: No chest wall abnormality. No acute or significant osseous findings.   CTA  ABDOMEN AND PELVIS FINDINGS   Hepatobiliary: Suspicious liver lesions. Cholelithiasis with no evidence of gallbladder wall thickening. No biliary ductal dilation.   Pancreas: Pancreatic ductal dilatation. Nodule of the tail of the pancreas measuring up to 1.4 cm on series 10, image 92 which enhances same degree as the spleen, finding is unchanged when compared with 2018 prior and likely a splenule.   Spleen: Normal in size without focal abnormality.   Adrenals/Urinary Tract: Bilateral adrenal glands are unremarkable. No hydronephrosis or nephrolithiasis. Bladder is unremarkable.   Stomach/Bowel: Stomach is unremarkable. Diverticulosis. Normal appendix. No evidence of bowel wall thickening, obstruction, or inflammatory change.   Vascular/lymphatic: Severe atherosclerotic disease of the abdominal aorta, most pronounced at the infrarenal abdominal aorta. Severe narrowing of the proximal celiac artery with poststenotic dilatation due to calcified and noncalcified plaque. Severe narrowing of the proximal SMA due to calcified and noncalcified plaque. Mild narrowing of the bilateral renal arteries due to calcified and noncalcified plaque. SMA is patent.   Reproductive: Prostate is unremarkable.   Other: Moderate bilateral fat containing inguinal hernias. No abdominopelvic ascites.   Musculoskeletal: Old left-sided rib fractures.No acute or significant osseous findings.   VASCULAR MEASUREMENTS PERTINENT TO TAVR:    AORTA:   Minimal Aortic Diameter -  7.7 mm   Severity of Aortic Calcification-severe   RIGHT PELVIS:   Right Common Iliac Artery -   Minimal Diameter-4.2 mm   Tortuosity-mild   Calcification-severe   Right External Iliac Artery -   Minimal Diameter-7.2 mm   Tortuosity-mild   Calcification-mild   Right Common Femoral Artery -   Minimal Diameter-5.9 mm   Tortuosity-none   Calcification-mild   LEFT PELVIS:   Left Common Iliac Artery -   Minimal Diameter-3.9 mm   Tortuosity-mild   Calcification-severe   Left External Iliac Artery -   Minimal Diameter-7.8 mm   Tortuosity-mild   Calcification-none   Left Common Femoral Artery -   Minimal Diameter-4.9 mm   Tortuosity-none   Calcification-mild   Review of the MIP images confirms the above findings.   IMPRESSION: Vascular:   1. Vascular findings and measurements pertinent to potential TAVR procedure, as detailed above. 2. Severe thickening calcification of the aortic valve, compatible with reported clinical history of severe aortic stenosis. 3. Severe aortoiliac atherosclerosis with severe narrowing at the origins of the celiac, SMA, and bilateral common iliac arteries to calcified and noncalcified plaque. 4. Linear filling defect of the right external iliac artery, likely due to focal dissection. 5. Left main and 3 vessel coronary artery disease status post CABG.   Nonvascular:   1. Bilateral mosaic attenuation, findings can be seen in the setting of air trapping or pulmonary hypertension. 2. Moderate hiatal hernia.     Electronically Signed   By: Yetta Glassman M.D.   On: 06/25/2021 13:21      Impression:   This 88 year old gentleman has stage D, severe, symptomatic aortic stenosis with New York Heart Association class II symptoms of exertional fatigue and shortness of breath consistent with chronic diastolic congestive heart failure.  I have personally reviewed his 2D  echocardiogram, cardiac catheterization, and CTA images.  His echocardiogram shows a severely calcified aortic valve with thickening and restricted leaflets.  The mean gradient was 53 mmHg with a valve area of 0.8 cm consistent with severe aortic stenosis.  Left ventricular systolic function is normal.  Cardiac catheterization showed patent vein grafts to the obtuse marginal and right coronary system.  There was a high-grade eccentric ostial left  main stenosis with an atretic LIMA to the LAD and therefore the left main stenosis was successfully treated with PCI and DES.  I agree that aortic valve replacement is indicated in this patient for relief of his symptoms and to prevent progressive left ventricular dysfunction.  I think TAVR would be the best option for treating him given his age and prior coronary artery bypass graft surgery.  His gated cardiac CTA shows anatomy suitable for TAVR using a SAPIEN 3 valve.  His abdominal and pelvic CTA shows severe aortoiliac atherosclerosis although I think there is adequate pelvic vascular anatomy to allow transfemoral insertion.  His electrocardiogram shows sinus bradycardia with first-degree AV block and incomplete right bundle branch block which may increase his risk of high-grade AV block and need for PPM postop.  I discussed this in detail with him.   The patient and his family were counseled at length regarding treatment alternatives for management of severe symptomatic aortic stenosis. The risks and benefits of surgical intervention has been discussed in detail. Long-term prognosis with medical therapy was discussed. Alternative approaches such as conventional surgical aortic valve replacement, transcatheter aortic valve replacement, and palliative medical therapy were compared and contrasted at length. This discussion was placed in the context of the patient's own specific clinical presentation and past medical history. All of their questions have been addressed.     Following the decision to proceed with transcatheter aortic valve replacement, a discussion was held regarding what types of management strategies would be attempted intraoperatively in the event of life-threatening complications, including whether or not the patient would be considered a candidate for the use of cardiopulmonary bypass and/or conversion to open sternotomy for attempted surgical intervention.  He is not a candidate for emergent sternotomy to manage any intraoperative complications given his age of 71 and prior coronary bypass surgery.  The patient is aware of the fact that transient use of cardiopulmonary bypass may be necessary. The patient has been advised of a variety of complications that might develop including but not limited to risks of death, stroke, paravalvular leak, aortic dissection or other major vascular complications, aortic annulus rupture, device embolization, cardiac rupture or perforation, mitral regurgitation, acute myocardial infarction, arrhythmia, heart block or bradycardia requiring permanent pacemaker placement, congestive heart failure, respiratory failure, renal failure, pneumonia, infection, other late complications related to structural valve deterioration or migration, or other complications that might ultimately cause a temporary or permanent loss of functional independence or other long term morbidity. The patient provides full informed consent for the procedure as described and all questions were answered.       Plan:   Transfemoral TAVR using a SAPIEN 3 valve.        Gaye Pollack, MD

## 2021-08-13 ENCOUNTER — Telehealth: Payer: Self-pay | Admitting: Physician Assistant

## 2021-08-13 ENCOUNTER — Inpatient Hospital Stay (HOSPITAL_COMMUNITY): Payer: PPO | Admitting: Emergency Medicine

## 2021-08-13 ENCOUNTER — Other Ambulatory Visit: Payer: Self-pay

## 2021-08-13 ENCOUNTER — Inpatient Hospital Stay (HOSPITAL_COMMUNITY)
Admission: RE | Admit: 2021-08-13 | Discharge: 2021-08-14 | DRG: 267 | Disposition: A | Discharge status: Home / Self Care | Payer: PPO | Source: Ambulatory Visit | Attending: Cardiovascular Disease | Admitting: Cardiovascular Disease

## 2021-08-13 ENCOUNTER — Inpatient Hospital Stay (HOSPITAL_COMMUNITY)
Admission: RE | Admit: 2021-08-13 | Discharge: 2021-08-13 | Disposition: A | Discharge status: Home / Self Care | Payer: PPO | Source: Ambulatory Visit | Attending: Cardiovascular Disease | Admitting: Cardiovascular Disease

## 2021-08-13 ENCOUNTER — Ambulatory Visit: Payer: PPO

## 2021-08-13 ENCOUNTER — Inpatient Hospital Stay (HOSPITAL_COMMUNITY): Payer: PPO | Admitting: Certified Registered"

## 2021-08-13 ENCOUNTER — Encounter (HOSPITAL_COMMUNITY)
Admission: RE | Disposition: A | Discharge status: Home / Self Care | Payer: Self-pay | Source: Ambulatory Visit | Attending: Cardiovascular Disease

## 2021-08-13 ENCOUNTER — Encounter (HOSPITAL_COMMUNITY): Payer: Self-pay | Admitting: Cardiovascular Disease

## 2021-08-13 DIAGNOSIS — I10 Essential (primary) hypertension: Secondary | ICD-10-CM

## 2021-08-13 DIAGNOSIS — N1832 Chronic kidney disease, stage 3b: Secondary | ICD-10-CM | POA: Diagnosis present

## 2021-08-13 DIAGNOSIS — E039 Hypothyroidism, unspecified: Secondary | ICD-10-CM | POA: Diagnosis present

## 2021-08-13 DIAGNOSIS — I2510 Atherosclerotic heart disease of native coronary artery without angina pectoris: Secondary | ICD-10-CM | POA: Diagnosis present

## 2021-08-13 DIAGNOSIS — Z79899 Other long term (current) drug therapy: Secondary | ICD-10-CM

## 2021-08-13 DIAGNOSIS — Z7982 Long term (current) use of aspirin: Secondary | ICD-10-CM | POA: Diagnosis not present

## 2021-08-13 DIAGNOSIS — I447 Left bundle-branch block, unspecified: Secondary | ICD-10-CM | POA: Diagnosis not present

## 2021-08-13 DIAGNOSIS — I350 Nonrheumatic aortic (valve) stenosis: Secondary | ICD-10-CM

## 2021-08-13 DIAGNOSIS — I779 Disorder of arteries and arterioles, unspecified: Secondary | ICD-10-CM | POA: Diagnosis present

## 2021-08-13 DIAGNOSIS — Z87891 Personal history of nicotine dependence: Secondary | ICD-10-CM | POA: Diagnosis not present

## 2021-08-13 DIAGNOSIS — Z006 Encounter for examination for normal comparison and control in clinical research program: Secondary | ICD-10-CM

## 2021-08-13 DIAGNOSIS — K449 Diaphragmatic hernia without obstruction or gangrene: Secondary | ICD-10-CM | POA: Diagnosis present

## 2021-08-13 DIAGNOSIS — Z952 Presence of prosthetic heart valve: Secondary | ICD-10-CM

## 2021-08-13 DIAGNOSIS — Z951 Presence of aortocoronary bypass graft: Secondary | ICD-10-CM

## 2021-08-13 DIAGNOSIS — I129 Hypertensive chronic kidney disease with stage 1 through stage 4 chronic kidney disease, or unspecified chronic kidney disease: Secondary | ICD-10-CM | POA: Diagnosis present

## 2021-08-13 DIAGNOSIS — Z8249 Family history of ischemic heart disease and other diseases of the circulatory system: Secondary | ICD-10-CM

## 2021-08-13 DIAGNOSIS — Z7902 Long term (current) use of antithrombotics/antiplatelets: Secondary | ICD-10-CM | POA: Diagnosis not present

## 2021-08-13 DIAGNOSIS — Z79890 Hormone replacement therapy (postmenopausal): Secondary | ICD-10-CM | POA: Diagnosis not present

## 2021-08-13 DIAGNOSIS — I440 Atrioventricular block, first degree: Secondary | ICD-10-CM | POA: Diagnosis not present

## 2021-08-13 DIAGNOSIS — I452 Bifascicular block: Secondary | ICD-10-CM | POA: Diagnosis not present

## 2021-08-13 DIAGNOSIS — E782 Mixed hyperlipidemia: Secondary | ICD-10-CM | POA: Diagnosis present

## 2021-08-13 HISTORY — PX: ULTRASOUND GUIDANCE FOR VASCULAR ACCESS: SHX6516

## 2021-08-13 HISTORY — DX: Nonrheumatic aortic (valve) stenosis: I35.0

## 2021-08-13 HISTORY — PX: TRANSCATHETER AORTIC VALVE REPLACEMENT, TRANSFEMORAL: SHX6400

## 2021-08-13 HISTORY — PX: INTRAOPERATIVE TRANSTHORACIC ECHOCARDIOGRAM: SHX6523

## 2021-08-13 HISTORY — DX: Presence of prosthetic heart valve: Z95.2

## 2021-08-13 LAB — POCT I-STAT 7, (LYTES, BLD GAS, ICA,H+H)
Acid-base deficit: 1 mmol/L (ref 0.0–2.0)
Bicarbonate: 25.4 mmol/L (ref 20.0–28.0)
Calcium, Ion: 1.29 mmol/L (ref 1.15–1.40)
HCT: 34 % — ABNORMAL LOW (ref 39.0–52.0)
Hemoglobin: 11.6 g/dL — ABNORMAL LOW (ref 13.0–17.0)
O2 Saturation: 100 %
Potassium: 4.4 mmol/L (ref 3.5–5.1)
Sodium: 140 mmol/L (ref 135–145)
TCO2: 27 mmol/L (ref 22–32)
pCO2 arterial: 49.2 mmHg — ABNORMAL HIGH (ref 32–48)
pH, Arterial: 7.322 — ABNORMAL LOW (ref 7.35–7.45)
pO2, Arterial: 242 mmHg — ABNORMAL HIGH (ref 83–108)

## 2021-08-13 LAB — POCT I-STAT, CHEM 8
BUN: 25 mg/dL — ABNORMAL HIGH (ref 8–23)
BUN: 24 mg/dL — ABNORMAL HIGH (ref 8–23)
Calcium, Ion: 1.29 mmol/L (ref 1.15–1.40)
Calcium, Ion: 1.27 mmol/L (ref 1.15–1.40)
Chloride: 106 mmol/L (ref 98–111)
Chloride: 107 mmol/L (ref 98–111)
Creatinine, Ser: 1.6 mg/dL — ABNORMAL HIGH (ref 0.61–1.24)
Creatinine, Ser: 1.6 mg/dL — ABNORMAL HIGH (ref 0.61–1.24)
Glucose, Bld: 146 mg/dL — ABNORMAL HIGH (ref 70–99)
Glucose, Bld: 128 mg/dL — ABNORMAL HIGH (ref 70–99)
HCT: 34 % — ABNORMAL LOW (ref 39.0–52.0)
HCT: 30 % — ABNORMAL LOW (ref 39.0–52.0)
Hemoglobin: 11.6 g/dL — ABNORMAL LOW (ref 13.0–17.0)
Hemoglobin: 10.2 g/dL — ABNORMAL LOW (ref 13.0–17.0)
Potassium: 4.5 mmol/L (ref 3.5–5.1)
Potassium: 4.6 mmol/L (ref 3.5–5.1)
Sodium: 140 mmol/L (ref 135–145)
Sodium: 141 mmol/L (ref 135–145)
TCO2: 25 mmol/L (ref 22–32)
TCO2: 23 mmol/L (ref 22–32)

## 2021-08-13 LAB — ECHOCARDIOGRAM LIMITED
AR max vel: 3.03 cm2
AV Area VTI: 2.77 cm2
AV Area mean vel: 1.25 cm2
AV Mean grad: 3 mmHg
AV Peak grad: 5.9 mmHg
Ao pk vel: 1.21 m/s

## 2021-08-13 SURGERY — IMPLANTATION, AORTIC VALVE, TRANSCATHETER, FEMORAL APPROACH
Anesthesia: Monitor Anesthesia Care | Site: Groin

## 2021-08-13 MED ORDER — SODIUM CHLORIDE 0.9 % IV SOLN: INTRAVENOUS | Status: AC

## 2021-08-13 MED ORDER — DEXMEDETOMIDINE HCL IN NACL 400 MCG/100ML IV SOLN
0.1000 ug/kg/h | INTRAVENOUS | Status: AC
  Administered 2021-08-13: 79.4 ug via INTRAVENOUS
  Administered 2021-08-13: 1 ug/kg/h via INTRAVENOUS
  Filled 2021-08-13: qty 100

## 2021-08-13 MED ORDER — SODIUM CHLORIDE 0.9% FLUSH: 3.0000 mL | INTRAVENOUS | Status: DC | PRN

## 2021-08-13 MED ORDER — LEVOTHYROXINE SODIUM 112 MCG PO TABS
112.0000 ug | ORAL_TABLET | Freq: Every day | ORAL | Status: DC
  Administered 2021-08-14: 112 ug via ORAL
  Filled 2021-08-13: qty 1

## 2021-08-13 MED ORDER — ROSUVASTATIN CALCIUM 20 MG PO TABS
40.0000 mg | ORAL_TABLET | Freq: Every day | ORAL | Status: DC
Start: 2021-08-13 — End: 2021-08-14
  Administered 2021-08-13 – 2021-08-14 (×2): 40 mg via ORAL
  Filled 2021-08-13 (×2): qty 2

## 2021-08-13 MED ORDER — AMLODIPINE BESYLATE 5 MG PO TABS
5.0000 mg | ORAL_TABLET | Freq: Every day | ORAL | Status: DC
  Administered 2021-08-13 – 2021-08-14 (×2): 5 mg via ORAL
  Filled 2021-08-13 (×2): qty 1

## 2021-08-13 MED ORDER — CHLORHEXIDINE GLUCONATE 4 % EX LIQD: 60.0000 mL | Freq: Once | CUTANEOUS | Status: DC

## 2021-08-13 MED ORDER — HEPARIN 6000 UNIT IRRIGATION SOLUTION
Status: DC | PRN
  Administered 2021-08-13: 3

## 2021-08-13 MED ORDER — OXYCODONE HCL 5 MG PO TABS: 5.0000 mg | ORAL_TABLET | ORAL | Status: DC | PRN

## 2021-08-13 MED ORDER — HEPARIN SODIUM (PORCINE) 1000 UNIT/ML IJ SOLN
INTRAMUSCULAR | Status: DC | PRN
  Administered 2021-08-13: 18000 [IU] via INTRAVENOUS
  Administered 2021-08-13: 5000 [IU] via INTRAVENOUS

## 2021-08-13 MED ORDER — ACETAMINOPHEN 325 MG PO TABS: 650.0000 mg | ORAL_TABLET | Freq: Four times a day (QID) | ORAL | Status: DC | PRN

## 2021-08-13 MED ORDER — CEFAZOLIN SODIUM-DEXTROSE 2-4 GM/100ML-% IV SOLN
2.0000 g | Freq: Three times a day (TID) | INTRAVENOUS | Status: AC
  Administered 2021-08-13 (×2): 2 g via INTRAVENOUS
  Filled 2021-08-13 (×2): qty 100

## 2021-08-13 MED ORDER — HEPARIN 6000 UNIT IRRIGATION SOLUTION
Status: AC
  Filled 2021-08-13: qty 1500

## 2021-08-13 MED ORDER — NOREPINEPHRINE 4 MG/250ML-% IV SOLN
0.0000 ug/min | INTRAVENOUS | Status: AC
  Administered 2021-08-13: 2 ug/min via INTRAVENOUS
  Filled 2021-08-13: qty 250

## 2021-08-13 MED ORDER — CHLORHEXIDINE GLUCONATE 0.12 % MT SOLN
OROMUCOSAL | Status: AC
  Administered 2021-08-13: 15 mL via OROMUCOSAL
  Filled 2021-08-13: qty 15

## 2021-08-13 MED ORDER — LACTATED RINGERS IV SOLN: INTRAVENOUS | Status: DC | PRN

## 2021-08-13 MED ORDER — SODIUM CHLORIDE 0.9 % IV SOLN: 250.0000 mL | INTRAVENOUS | Status: DC | PRN

## 2021-08-13 MED ORDER — HEPARIN 30,000 UNITS/1000 ML (OHS) CELLSAVER SOLUTION
Status: DC
  Filled 2021-08-13: qty 1000

## 2021-08-13 MED ORDER — CEFAZOLIN SODIUM-DEXTROSE 2-4 GM/100ML-% IV SOLN
2.0000 g | INTRAVENOUS | Status: AC
  Administered 2021-08-13: 2 g via INTRAVENOUS
  Filled 2021-08-13: qty 100

## 2021-08-13 MED ORDER — HEPARIN SODIUM (PORCINE) 1000 UNIT/ML IJ SOLN
INTRAMUSCULAR | Status: AC
  Filled 2021-08-13: qty 10

## 2021-08-13 MED ORDER — MAGNESIUM SULFATE 50 % IJ SOLN
40.0000 meq | INTRAMUSCULAR | Status: DC
  Filled 2021-08-13: qty 9.85

## 2021-08-13 MED ORDER — CLOPIDOGREL BISULFATE 75 MG PO TABS
75.0000 mg | ORAL_TABLET | Freq: Every day | ORAL | Status: DC
  Administered 2021-08-13 – 2021-08-14 (×2): 75 mg via ORAL
  Filled 2021-08-13 (×2): qty 1

## 2021-08-13 MED ORDER — SODIUM CHLORIDE 0.9 % IV SOLN: INTRAVENOUS | Status: DC

## 2021-08-13 MED ORDER — ASPIRIN 81 MG PO TBEC
81.0000 mg | DELAYED_RELEASE_TABLET | Freq: Every day | ORAL | Status: DC
Start: 2021-08-13 — End: 2021-08-14
  Administered 2021-08-13: 81 mg via ORAL
  Filled 2021-08-13: qty 1

## 2021-08-13 MED ORDER — CHLORHEXIDINE GLUCONATE 0.12 % MT SOLN
15.0000 mL | Freq: Once | OROMUCOSAL | Status: AC
Start: 2021-08-13 — End: 2021-08-13

## 2021-08-13 MED ORDER — LIDOCAINE HCL 1 % IJ SOLN
INTRAMUSCULAR | Status: DC | PRN
  Administered 2021-08-13: 10 mL via INTRADERMAL

## 2021-08-13 MED ORDER — NITROGLYCERIN IN D5W 200-5 MCG/ML-% IV SOLN
0.0000 ug/min | INTRAVENOUS | Status: DC
  Administered 2021-08-14: 5 ug/min via INTRAVENOUS
  Filled 2021-08-13: qty 250

## 2021-08-13 MED ORDER — MORPHINE SULFATE (PF) 2 MG/ML IV SOLN: 1.0000 mg | INTRAVENOUS | Status: DC | PRN

## 2021-08-13 MED ORDER — ONDANSETRON HCL 4 MG/2ML IJ SOLN: 4.0000 mg | Freq: Four times a day (QID) | INTRAMUSCULAR | Status: DC | PRN

## 2021-08-13 MED ORDER — PROTAMINE SULFATE 10 MG/ML IV SOLN
INTRAVENOUS | Status: DC | PRN
  Administered 2021-08-13: 250 mg via INTRAVENOUS

## 2021-08-13 MED ORDER — IODIXANOL 320 MG/ML IV SOLN
INTRAVENOUS | Status: DC | PRN
  Administered 2021-08-13: 28 mL via INTRA_ARTERIAL

## 2021-08-13 MED ORDER — LORATADINE 10 MG PO TABS
10.0000 mg | ORAL_TABLET | Freq: Every day | ORAL | Status: DC
Start: 2021-08-13 — End: 2021-08-14
  Administered 2021-08-13 – 2021-08-14 (×2): 10 mg via ORAL
  Filled 2021-08-13 (×2): qty 1

## 2021-08-13 MED ORDER — FOLIC ACID 1 MG PO TABS
1.0000 mg | ORAL_TABLET | Freq: Every day | ORAL | Status: DC
  Administered 2021-08-13 – 2021-08-14 (×2): 1 mg via ORAL
  Filled 2021-08-13 (×2): qty 1

## 2021-08-13 MED ORDER — TRAMADOL HCL 50 MG PO TABS: 50.0000 mg | ORAL_TABLET | ORAL | Status: DC | PRN

## 2021-08-13 MED ORDER — CHLORHEXIDINE GLUCONATE 4 % EX LIQD: 30.0000 mL | CUTANEOUS | Status: DC

## 2021-08-13 MED ORDER — POTASSIUM CHLORIDE 2 MEQ/ML IV SOLN
80.0000 meq | INTRAVENOUS | Status: DC
  Filled 2021-08-13: qty 40

## 2021-08-13 MED ORDER — CHLORHEXIDINE GLUCONATE 0.12 % MT SOLN: 15.0000 mL | Freq: Once | OROMUCOSAL | Status: DC

## 2021-08-13 MED ORDER — CLEVIDIPINE BUTYRATE 0.5 MG/ML IV EMUL
INTRAVENOUS | Status: DC | PRN
  Administered 2021-08-13: 2 mg/h via INTRAVENOUS

## 2021-08-13 MED ORDER — ACETAMINOPHEN 650 MG RE SUPP: 650.0000 mg | Freq: Four times a day (QID) | RECTAL | Status: DC | PRN

## 2021-08-13 MED ORDER — SODIUM CHLORIDE 0.9% FLUSH
3.0000 mL | Freq: Two times a day (BID) | INTRAVENOUS | Status: DC
  Administered 2021-08-14: 3 mL via INTRAVENOUS

## 2021-08-13 MED ORDER — LIDOCAINE HCL 1 % IJ SOLN
INTRAMUSCULAR | Status: AC
  Filled 2021-08-13: qty 20

## 2021-08-13 SURGICAL SUPPLY — 62 items
BAG COUNTER SPONGE SURGICOUNT (BAG) ×3 IMPLANT
BAG DECANTER FOR FLEXI CONT (MISCELLANEOUS) IMPLANT
BLADE CLIPPER SURG (BLADE) IMPLANT
BLADE STERNUM SYSTEM 6 (BLADE) IMPLANT
CABLE ADAPT CONN TEMP 6FT (ADAPTER) ×3 IMPLANT
CANISTER SUCT 3000ML PPV (MISCELLANEOUS) IMPLANT
CATH BEACON 5 .035 40 KMP TP (CATHETERS) IMPLANT
CATH BEACON 5 .038 40 KMP TP (CATHETERS) ×1
CATH DIAG EXPO 6F AL1 (CATHETERS) IMPLANT
CATH DIAG EXPO 6F VENT PIG 145 (CATHETERS) ×7 IMPLANT
CATH INFINITI 6F AL2 (CATHETERS) IMPLANT
CATH S G BIP PACING (CATHETERS) ×3 IMPLANT
CHLORAPREP W/TINT 26 (MISCELLANEOUS) ×3 IMPLANT
CNTNR URN SCR LID CUP LEK RST (MISCELLANEOUS) ×4 IMPLANT
CONT SPEC 4OZ STRL OR WHT (MISCELLANEOUS) ×2
COVER BACK TABLE 80X110 HD (DRAPES) IMPLANT
DERMABOND ADVANCED (GAUZE/BANDAGES/DRESSINGS) ×2
DERMABOND ADVANCED .7 DNX12 (GAUZE/BANDAGES/DRESSINGS) ×2 IMPLANT
DEVICE CLOSURE PERCLS PRGLD 6F (VASCULAR PRODUCTS) ×4 IMPLANT
DRSG TEGADERM 4X4.75 (GAUZE/BANDAGES/DRESSINGS) ×6 IMPLANT
ELECT REM PT RETURN 9FT ADLT (ELECTROSURGICAL) ×3
ELECTRODE REM PT RTRN 9FT ADLT (ELECTROSURGICAL) ×2 IMPLANT
GAUZE SPONGE 4X4 12PLY STRL (GAUZE/BANDAGES/DRESSINGS) ×3 IMPLANT
GLOVE BIO SURGEON STRL SZ8 (GLOVE) ×3 IMPLANT
GLOVE ECLIPSE 7.0 STRL STRAW (GLOVE) ×3 IMPLANT
GOWN STRL REUS W/ TWL LRG LVL3 (GOWN DISPOSABLE) IMPLANT
GOWN STRL REUS W/ TWL XL LVL3 (GOWN DISPOSABLE) ×2 IMPLANT
GOWN STRL REUS W/TWL LRG LVL3 (GOWN DISPOSABLE) ×2
GOWN STRL REUS W/TWL XL LVL3 (GOWN DISPOSABLE) ×2
GUIDEWIRE SAF TJ AMPL .035X180 (WIRE) ×3 IMPLANT
GUIDEWIRE SAFE TJ AMPLATZ EXST (WIRE) ×3 IMPLANT
KIT BASIN OR (CUSTOM PROCEDURE TRAY) ×3 IMPLANT
KIT HEART LEFT (KITS) ×3 IMPLANT
KIT SAPIAN 3 ULTRA RESILIA 23 (Valve) ×1 IMPLANT
KIT TURNOVER KIT B (KITS) ×3 IMPLANT
NS IRRIG 1000ML POUR BTL (IV SOLUTION) ×3 IMPLANT
PACK ENDO MINOR (CUSTOM PROCEDURE TRAY) ×3 IMPLANT
PAD ARMBOARD 7.5X6 YLW CONV (MISCELLANEOUS) ×6 IMPLANT
PAD ELECT DEFIB RADIOL ZOLL (MISCELLANEOUS) ×3 IMPLANT
PERCLOSE PROGLIDE 6F (VASCULAR PRODUCTS) ×6
POSITIONER HEAD DONUT 9IN (MISCELLANEOUS) ×3 IMPLANT
SET MICROPUNCTURE 5F STIFF (MISCELLANEOUS) ×3 IMPLANT
SHEATH BRITE TIP 7FR 35CM (SHEATH) ×3 IMPLANT
SHEATH PINNACLE 6F 10CM (SHEATH) ×3 IMPLANT
SHEATH PINNACLE 8F 10CM (SHEATH) ×3 IMPLANT
SLEEVE REPOSITIONING LENGTH 30 (MISCELLANEOUS) ×3 IMPLANT
SPIKE FLUID TRANSFER (MISCELLANEOUS) ×3 IMPLANT
STOPCOCK MORSE 400PSI 3WAY (MISCELLANEOUS) ×6 IMPLANT
SUT SILK  1 MH (SUTURE) ×1
SUT SILK 1 MH (SUTURE) ×2 IMPLANT
SYR 50ML LL SCALE MARK (SYRINGE) ×3 IMPLANT
SYR BULB IRRIG 60ML STRL (SYRINGE) IMPLANT
TOWEL GREEN STERILE (TOWEL DISPOSABLE) ×6 IMPLANT
TRANSDUCER W/STOPCOCK (MISCELLANEOUS) ×6 IMPLANT
TRAY FOLEY SLVR 14FR TEMP STAT (SET/KITS/TRAYS/PACK) IMPLANT
TUBE SUCT INTRACARD DLP 20F (MISCELLANEOUS) IMPLANT
TUBING CIL FLEX 10 FLL-RA (TUBING) ×1 IMPLANT
WIRE EMERALD 3MM-J .035X150CM (WIRE) ×3 IMPLANT
WIRE EMERALD 3MM-J .035X260CM (WIRE) ×3 IMPLANT
WIRE EMERALD ST .035X260CM (WIRE) ×6 IMPLANT
WIRE HI TORQ VERSACORE-J 145CM (WIRE) ×1 IMPLANT
WIRE SAFARI SM CURVE 275 (WIRE) ×3 IMPLANT

## 2021-08-13 NOTE — Discharge Instructions (Signed)

## 2021-08-13 NOTE — Anesthesia Procedure Notes (Signed)
Arterial Line Insertion Start/End7/18/2023 7:00 AM, 08/13/2021 7:10 AM Performed by: Amadeo Garnet, CRNA, CRNA  Patient location: Pre-op. Preanesthetic checklist: patient identified, IV checked, site marked, risks and benefits discussed, surgical consent, monitors and equipment checked, pre-op evaluation, timeout performed and anesthesia consent Lidocaine 1% used for infiltration Right, radial was placed Catheter size: 20 G Hand hygiene performed  and maximum sterile barriers used   Attempts: 2 Procedure performed without using ultrasound guided technique. Following insertion, Biopatch and dressing applied. Post procedure assessment: normal  Patient tolerated the procedure well with no immediate complications.

## 2021-08-13 NOTE — Transfer of Care (Signed)
Immediate Anesthesia Transfer of Care Note  Patient: Dillon [name]  Procedure(s) Performed: Transcatheter Aortic Valve Replacement, Transfemoral (Chest) INTRAOPERATIVE TRANSTHORACIC ECHOCARDIOGRAM (Chest)  Patient Location: Cath Lab  Anesthesia Type:MAC  Level of Consciousness: awake, alert  and oriented  Airway & Oxygen Therapy: Patient Spontanous Breathing and Patient connected to nasal cannula oxygen  Post-op Assessment: Report given to RN, Post -op Vital signs reviewed and stable and Patient moving all extremities  Post vital signs: Reviewed and stable  Last Vitals:  Vitals Value Taken Time  BP    Temp    Pulse    Resp    SpO2      Last Pain:  Vitals:   08/13/21 0555  TempSrc: Oral  PainSc: 0-No pain         Complications: No notable events documented.

## 2021-08-13 NOTE — Progress Notes (Signed)
  [facility] VALVE TEAM  Patient doing well s/p TAVR. He is hemodynamically stable. Groin sites stable. ECG with sinus with old 1st deg AV block and new LBBB but no high grade block. Arterial line discontinued and transferred to 4E. Plan for early ambulation after bedrest completed and hopeful discharge over the next 24-48 hours.   Angelena Form PA-C  [facility]  Pager [phone]

## 2021-08-13 NOTE — Progress Notes (Signed)
Pt arrived to unit from cath lab  VSS, A/O x 4,  CCMD called ,CHG given, pt oriented to unit, bilateral groin incision level 0 TAVR .continue to monitor.   Albin Felling Jaquanna Ballentine, RN    08/13/21 1155  Vitals  Temp 97.8 F (36.6 C)  Temp Source Oral  BP (!) 130/46  MAP (mmHg) 66  BP Location Left Arm  BP Method Automatic  Patient Position (if appropriate) Lying  Pulse Rate (!) 51  Pulse Rate Source Monitor  ECG Heart Rate (!) 50  Resp 14  Level of Consciousness  Level of Consciousness Alert  Oxygen Therapy  SpO2 92 %  O2 Device Room Air  O2 Flow Rate (L/min) 0 L/min  ECG Monitoring  Telemetry Box Number [id number]  Tele Box Verification Completed by Second Verifier Completed  Pain Assessment  Pain Scale 0-10  Pain Score 0  Glasgow Coma Scale  Eye Opening 4  Best Verbal Response (NON-intubated) 5  Best Motor Response 6  Glasgow Coma Scale Score 15  MEWS Score  MEWS Temp 0  MEWS Systolic 0  MEWS Pulse 1  MEWS RR 0  MEWS LOC 0  MEWS Score 1  MEWS Score Color Green

## 2021-08-13 NOTE — Anesthesia Procedure Notes (Signed)
Procedure Name: MAC Date/Time: 08/13/2021 7:39 AM  Performed by: Amadeo Garnet, CRNAPre-anesthesia Checklist: Patient identified, Emergency Drugs available, Suction available and Patient being monitored Patient Re-evaluated:Patient Re-evaluated prior to induction Oxygen Delivery Method: Simple face mask Preoxygenation: Pre-oxygenation with 100% oxygen Induction Type: IV induction Placement Confirmation: positive ETCO2 Dental Injury: Teeth and Oropharynx as per pre-operative assessment

## 2021-08-13 NOTE — Op Note (Signed)
[facility]   MULTIDISCIPLINARY HEART VALVE TEAM   TAVR OPERATIVE NOTE   Date of Procedure:  08/13/2021  Preoperative Diagnosis: Severe Aortic Stenosis   Postoperative Diagnosis: Same   Procedure:   Transcatheter Aortic Valve Replacement - Percutaneous Right Transfemoral Approach  Edwards Sapien 3 Ultra ResiliaTHV (size 23 mm, model # 9755RSL, serial # [id number])   Co-Surgeons:  Gaye Pollack, MD and Sherren Mocha, MD   Anesthesiologist:  Wilfrid Lund, MD  Echocardiographer:  Viona Gilmore. O'Neal, MD  Pre-operative Echo Findings: Severe aortic stenosis Normal left ventricular systolic function  Post-operative Echo Findings: Trace paravalvular leak Normal left ventricular systolic function   BRIEF CLINICAL NOTE AND INDICATIONS FOR SURGERY  This 88 year old gentleman has stage D, severe, symptomatic aortic stenosis with New York Heart Association class II symptoms of exertional fatigue and shortness of breath consistent with chronic diastolic congestive heart failure.  I have personally reviewed his 2D echocardiogram, cardiac catheterization, and CTA images.  His echocardiogram shows a severely calcified aortic valve with thickening and restricted leaflets.  The mean gradient was 53 mmHg with a valve area of 0.8 cm consistent with severe aortic stenosis.  Left ventricular systolic function is normal.  Cardiac catheterization showed patent vein grafts to the obtuse marginal and right coronary system.  There was a high-grade eccentric ostial left main stenosis with an atretic LIMA to the LAD and therefore the left main stenosis was successfully treated with PCI and DES.  I agree that aortic valve replacement is indicated in this patient for relief of his symptoms and to prevent progressive left ventricular dysfunction.  I think TAVR would be the best option for treating him given his age and prior coronary artery bypass graft surgery.  His gated cardiac CTA shows anatomy suitable  for TAVR using a SAPIEN 3 valve.  His abdominal and pelvic CTA shows severe aortoiliac atherosclerosis although I think there is adequate pelvic vascular anatomy to allow transfemoral insertion.  His electrocardiogram shows sinus bradycardia with first-degree AV block and incomplete right bundle branch block which may increase his risk of high-grade AV block and need for PPM postop.  I discussed this in detail with him.   The patient and his family were counseled at length regarding treatment alternatives for management of severe symptomatic aortic stenosis. The risks and benefits of surgical intervention has been discussed in detail. Long-term prognosis with medical therapy was discussed. Alternative approaches such as conventional surgical aortic valve replacement, transcatheter aortic valve replacement, and palliative medical therapy were compared and contrasted at length. This discussion was placed in the context of the patient's own specific clinical presentation and past medical history. All of their questions have been addressed.    Following the decision to proceed with transcatheter aortic valve replacement, a discussion was held regarding what types of management strategies would be attempted intraoperatively in the event of life-threatening complications, including whether or not the patient would be considered a candidate for the use of cardiopulmonary bypass and/or conversion to open sternotomy for attempted surgical intervention.  He is not a candidate for emergent sternotomy to manage any intraoperative complications given his age of 33 and prior coronary bypass surgery.  The patient is aware of the fact that transient use of cardiopulmonary bypass may be necessary. The patient has been advised of a variety of complications that might develop including but not limited to risks of death, stroke, paravalvular leak, aortic dissection or other major vascular complications, aortic annulus rupture,  device embolization, cardiac  rupture or perforation, mitral regurgitation, acute myocardial infarction, arrhythmia, heart block or bradycardia requiring permanent pacemaker placement, congestive heart failure, respiratory failure, renal failure, pneumonia, infection, other late complications related to structural valve deterioration or migration, or other complications that might ultimately cause a temporary or permanent loss of functional independence or other long term morbidity. The patient provides full informed consent for the procedure as described and all questions were answered.     DETAILS OF THE OPERATIVE PROCEDURE  PREPARATION:    The patient was brought to the operating room on the above mentioned date and appropriate monitoring was established by the anesthesia team. The patient was placed in the supine position on the operating table.  Intravenous antibiotics were administered. The patient was monitored closely throughout the procedure under conscious sedation.    Baseline transthoracic echocardiogram was performed. The patient's abdomen and both groins were prepped and draped in a sterile manner. A time out procedure was performed.   PERIPHERAL ACCESS:    Using the modified Seldinger technique, femoral arterial and venous access was obtained with placement of 6 Fr sheaths on the left side.  A pigtail diagnostic catheter was passed through the left arterial sheath under fluoroscopic guidance into the aortic root.  A temporary transvenous pacemaker catheter was passed through the left femoral venous sheath under fluoroscopic guidance into the right ventricle.  The pacemaker was tested to ensure stable lead placement and pacemaker capture. Aortic root angiography was performed in order to determine the optimal angiographic angle for valve deployment.   TRANSFEMORAL ACCESS:   Percutaneous transfemoral access and sheath placement was performed using ultrasound guidance.  The right  common femoral artery was cannulated using a micropuncture needle and appropriate location was verified using hand injection angiogram.  A pair of Abbott Perclose percutaneous closure devices were placed and a 6 French sheath replaced into the femoral artery.  The patient was heparinized systemically and ACT verified > 250 seconds.    A 14 Fr transfemoral E-sheath was introduced into the right common femoral artery after progressively dilating over an Amplatz superstiff wire. An AL-2 catheter was used to direct a straight-tip exchange length wire across the native aortic valve into the left ventricle. This was exchanged out for a pigtail catheter and position was confirmed in the LV apex. Simultaneous LV and Ao pressures were recorded.  The pigtail catheter was exchanged for a Safari wire in the LV apex.   BALLOON AORTIC VALVULOPLASTY:   Not performed   TRANSCATHETER HEART VALVE DEPLOYMENT:   An Edwards Sapien 3 Ultra transcatheter heart valve (size 23 mm) was prepared and crimped per manufacturer's guidelines, and the proper orientation of the valve is confirmed on the [employer] delivery system. The valve was advanced through the introducer sheath using normal technique until in an appropriate position in the abdominal aorta beyond the sheath tip. The balloon was then retracted and using the fine-tuning wheel was centered on the valve. The valve was then advanced across the aortic arch using appropriate flexion of the catheter. The valve was carefully positioned across the aortic valve annulus. The Commander catheter was retracted using normal technique. Once final position of the valve has been confirmed by angiographic assessment, the valve is deployed during rapid ventricular pacing to maintain systolic blood pressure < 50 mmHg and pulse pressure < 10 mmHg. The balloon inflation is held for >3 seconds after reaching full deployment volume. Once the balloon has fully deflated the balloon is  retracted into the ascending aorta  and valve function is assessed using echocardiography. There is felt to be trace paravalvular leak and no central aortic insufficiency.  The patient's hemodynamic recovery following valve deployment is good.  The deployment balloon and guidewire are both removed.    PROCEDURE COMPLETION:   The sheath was removed and femoral artery closure performed.  Protamine was administered once femoral arterial repair was complete. The temporary pacemaker, pigtail catheter and femoral sheaths were removed with manual pressure used for arterial and venous hemostasis.    The patient tolerated the procedure well and is transported to the cath lab recovery area in stable condition. There were no immediate intraoperative complications. All sponge instrument and needle counts are verified correct at completion of the operation.   No blood products were administered during the operation.  The patient received a total of 28 mL of intravenous contrast during the procedure.   Gaye Pollack, MD 08/13/2021

## 2021-08-13 NOTE — Telephone Encounter (Signed)
error 

## 2021-08-13 NOTE — Anesthesia Preprocedure Evaluation (Addendum)
Anesthesia Evaluation  Patient identified by MRN, date of birth, ID band Patient awake    Reviewed: Allergy & Precautions, NPO status , Patient's Chart, lab work & pertinent test results, reviewed documented beta blocker date and time   Airway Mallampati: III  TM Distance: >3 FB Neck ROM: Full    Dental  (+) Teeth Intact, Dental Advisory Given   Pulmonary former smoker,    breath sounds clear to auscultation       Cardiovascular hypertension, Pt. on medications and Pt. on home beta blockers + CAD and + CABG  + dysrhythmias + Valvular Problems/Murmurs AS  Rhythm:Regular Rate:Normal + Systolic murmurs    Neuro/Psych negative neurological ROS  negative psych ROS   GI/Hepatic Neg liver ROS, hiatal hernia,   Endo/Other  Hypothyroidism   Renal/GU Renal disease     Musculoskeletal negative musculoskeletal ROS (+)   Abdominal Normal abdominal exam  (+)   Peds  Hematology negative hematology ROS (+)   Anesthesia Other Findings   Reproductive/Obstetrics                            Anesthesia Physical Anesthesia Plan  ASA: 4  Anesthesia Plan: MAC   Post-op Pain Management:    Induction: Intravenous  PONV Risk Score and Plan: 2 and Ondansetron and Propofol infusion  Airway Management Planned: Natural Airway and Simple Face Mask  Additional Equipment: Arterial line  Intra-op Plan:   Post-operative Plan:   Informed Consent: I have reviewed the patients History and Physical, chart, labs and discussed the procedure including the risks, benefits and alternatives for the proposed anesthesia with the patient or authorized representative who has indicated his/her understanding and acceptance.       Plan Discussed with: CRNA  Anesthesia Plan Comments:        Anesthesia Quick Evaluation

## 2021-08-13 NOTE — Interval H&P Note (Signed)
History and Physical Interval Note:  08/13/2021 6:29 AM  Dillon [name]  has presented today for surgery, with the diagnosis of Severe Aortic Stenosis.  The various methods of treatment have been discussed with the patient and family. After consideration of risks, benefits and other options for treatment, the patient has consented to  Procedure(s): Transcatheter Aortic Valve Replacement, Transfemoral (N/A) INTRAOPERATIVE TRANSTHORACIC ECHOCARDIOGRAM (N/A) as a surgical intervention.  The patient's history has been reviewed, patient examined, no change in status, stable for surgery.  I have reviewed the patient's chart and labs.  Questions were answered to the patient's satisfaction.     Gaye Pollack

## 2021-08-13 NOTE — Progress Notes (Signed)
  Echocardiogram 2D Echocardiogram limited  has been performed.  Darlina Sicilian M 08/13/2021, 9:30 AM

## 2021-08-13 NOTE — Progress Notes (Signed)
Mobility Specialist: Progress Note   08/13/21 1449  Mobility  Activity Ambulated independently in hallway  Level of Assistance Independent  Assistive Device None  Distance Ambulated (ft) 470 ft  Activity Response Tolerated well  $Mobility charge 1 Mobility   Received pt sitting EOB having no complaints and agreeable to mobility. Pt was asymptomatic throughout ambulation and returned to room w/o fault. Left sitting EOB w/ call bell in reach and all needs met.  [facility] Jerrit Horen Mobility Specialist Mobility Specialist [address]: [phone]

## 2021-08-13 NOTE — Plan of Care (Signed)

## 2021-08-13 NOTE — Anesthesia Postprocedure Evaluation (Signed)
Anesthesia Post Note  Patient: Dillon [name]  Procedure(s) Performed: Transcatheter Aortic Valve Replacement, Transfemoral (Chest) INTRAOPERATIVE TRANSTHORACIC ECHOCARDIOGRAM (Chest) ULTRASOUND GUIDANCE FOR VASCULAR ACCESS, BILATERAL FEMORAL ARTERIES AND LEFT FEMORAL VEIN (Bilateral: Groin)     Patient location during evaluation: PACU Anesthesia Type: MAC Level of consciousness: awake and alert Pain management: pain level controlled Vital Signs Assessment: post-procedure vital signs reviewed and stable Respiratory status: spontaneous breathing, nonlabored ventilation, respiratory function stable and patient connected to nasal cannula oxygen Cardiovascular status: stable and blood pressure returned to baseline Postop Assessment: no apparent nausea or vomiting Anesthetic complications: no   No notable events documented.  Last Vitals:  Vitals:   08/13/21 1155 08/13/21 1200  BP: (!) 130/46 (!) 121/47  Pulse: (!) 51 (!) 52  Resp: 14 14  Temp: 36.6 C   SpO2: 92% 99%    Last Pain:  Vitals:   08/13/21 1155  TempSrc: Oral  PainSc: 0-No pain                 Effie Berkshire

## 2021-08-13 NOTE — Op Note (Signed)
[facility]   MULTIDISCIPLINARY HEART VALVE TEAM   TAVR OPERATIVE NOTE   Date of Procedure:  08/13/2021  Preoperative Diagnosis: Severe Aortic Stenosis   Postoperative Diagnosis: Same   Procedure:   Transcatheter Aortic Valve Replacement - Percutaneous  Transfemoral Approach  Edwards Sapien 3 Ultra Resilia THV (size 23 mm, serial # [id number])   Co-Surgeons:  Gaye Pollack, MD and Sherren Mocha, MD  Anesthesiologist:  Dr Smith Robert  Echocardiographer:  Dr Audie Box  Pre-operative Echo Findings: Severe aortic stenosis Normal left ventricular systolic function  Post-operative Echo Findings: Trace paravalvular leak Normal left ventricular systolic function  BRIEF CLINICAL NOTE AND INDICATIONS FOR SURGERY  88 year old gentleman with coronary artery disease and prior CABG in 2009.  He has developed progressive, now severe aortic stenosis with NYHA functional class III symptoms of exertional dyspnea and fatigue.  He was found to have severe left main stenosis in the setting of an atretic LIMA to LAD graft.  He was treated with coronary stenting but continued to remain symptomatic from his aortic stenosis.  After extensive imaging tests and multidisciplinary team evaluation, he presents today for TAVR via a planned transfemoral approach.  During the course of the patient's preoperative work up they have been evaluated comprehensively by a multidisciplinary team of specialists coordinated through the [facility] in the [facility].  They have been demonstrated to suffer from symptomatic severe aortic stenosis as noted above. The patient has been counseled extensively as to the relative risks and benefits of all options for the treatment of severe aortic stenosis including long term medical therapy, conventional surgery for aortic valve replacement, and transcatheter aortic valve replacement.  The patient has been independently  evaluated in formal cardiac surgical consultation by Dr Cyndia Bent, who deemed the patient appropriate for TAVR. Based upon review of all of the patient's preoperative diagnostic tests they are felt to be candidate for transcatheter aortic valve replacement using the transfemoral approach as an alternative to conventional surgery.    Following the decision to proceed with transcatheter aortic valve replacement, a discussion has been held regarding what types of management strategies would be attempted intraoperatively in the event of life-threatening complications, including whether or not the patient would be considered a candidate for the use of cardiopulmonary bypass and/or conversion to open sternotomy for attempted surgical intervention.  The patient has been advised of a variety of complications that might develop peculiar to this approach including but not limited to risks of death, stroke, paravalvular leak, aortic dissection or other major vascular complications, aortic annulus rupture, device embolization, cardiac rupture or perforation, acute myocardial infarction, arrhythmia, heart block or bradycardia requiring permanent pacemaker placement, congestive heart failure, respiratory failure, renal failure, pneumonia, infection, other late complications related to structural valve deterioration or migration, or other complications that might ultimately cause a temporary or permanent loss of functional independence or other long term morbidity.  The patient provides full informed consent for the procedure as described and all questions were answered preoperatively.  DETAILS OF THE OPERATIVE PROCEDURE  PREPARATION:   The patient is brought to the operating room on the above mentioned date and central monitoring was established by the anesthesia team including placement of a radial arterial line. The patient is placed in the supine position on the operating table.  Intravenous antibiotics are administered.  The patient is monitored closely throughout the procedure under conscious sedation.  Baseline transthoracic echocardiogram is performed. The patient's chest, abdomen, both groins,  and both lower extremities are prepared and draped in a sterile manner. A time out procedure is performed.   PERIPHERAL ACCESS:   Using ultrasound guidance, femoral arterial and venous access is obtained with placement of 6 Fr sheaths on the left side.  [place] images are digitally captured and stored in the patient's chart. A pigtail diagnostic catheter was passed through the femoral arterial sheath under fluoroscopic guidance into the aortic root.  A temporary transvenous pacemaker catheter was passed through the femoral venous sheath under fluoroscopic guidance into the right ventricle.  The pacemaker was tested to ensure stable lead placement and pacemaker capture. Aortic root angiography was performed in order to determine the optimal angiographic angle for valve deployment.  TRANSFEMORAL ACCESS:  A micropuncture technique is used to access the right femoral artery under fluoroscopic and ultrasound guidance.  2 Perclose devices are deployed at 10' and 2' positions to 'PreClose' the femoral artery. An 8 French sheath is placed and then an Amplatz Superstiff wire is advanced through the sheath. This is changed out for a 14 French transfemoral E-Sheath after progressively dilating over the Superstiff wire.  An AL-2 catheter was used to direct a straight-tip exchange length wire across the native aortic valve into the left ventricle. This was exchanged out for a pigtail catheter and position was confirmed in the LV apex. Simultaneous LV and Ao pressures were recorded.  The pigtail catheter was exchanged for a Meier wire in the LV apex.    BALLOON AORTIC VALVULOPLASTY:  Not performed  TRANSCATHETER HEART VALVE DEPLOYMENT:  An Edwards Sapien 3 transcatheter heart valve (size 23 mm) was prepared and crimped per manufacturer's  guidelines, and the proper orientation of the valve is confirmed on the [employer] delivery system. The valve was advanced through the introducer sheath using normal technique until in an appropriate position in the abdominal aorta beyond the sheath tip. The balloon was then retracted and using the fine-tuning wheel was centered on the valve. The valve was then advanced across the aortic arch using appropriate flexion of the catheter. The valve was carefully positioned across the aortic valve annulus. The Commander catheter was retracted using normal technique. Once final position of the valve has been confirmed by angiographic assessment, the valve is deployed while temporarily holding ventilation and during rapid ventricular pacing to maintain systolic blood pressure < 50 mmHg and pulse pressure < 10 mmHg. The balloon inflation is held for >3 seconds after reaching full deployment volume. Once the balloon has fully deflated the balloon is retracted into the ascending aorta and valve function is assessed using echocardiography. The patient's hemodynamic recovery following valve deployment is good.  The deployment balloon and guidewire are both removed. Echo demostrated acceptable post-procedural gradients, stable mitral valve function, and trace aortic insufficiency.    PROCEDURE COMPLETION:  The sheath was removed and femoral artery closure is performed using the 2 previously deployed Perclose devices.  Protamine is administered once femoral arterial repair was complete. The site is clear with no evidence of bleeding or hematoma after the sutures are tightened. The temporary pacemaker and pigtail catheters are removed.  Manual pressure is used for contralateral femoral arterial hemostasis for the 6 Fr sheath.  The patient tolerated the procedure well and is transported to the recovery area in stable condition. There were no immediate intraoperative complications. All sponge instrument and needle  counts are verified correct at completion of the operation.   The patient received a total of 28 mL of intravenous contrast during  the procedure.   Sherren Mocha, MD 08/13/2021 9:55 AM

## 2021-08-14 ENCOUNTER — Inpatient Hospital Stay (HOSPITAL_BASED_OUTPATIENT_CLINIC_OR_DEPARTMENT_OTHER)
Admission: RE | Admit: 2021-08-14 | Discharge: 2021-08-14 | Disposition: A | Discharge status: Home / Self Care | Payer: PPO | Source: Ambulatory Visit | Attending: Physician Assistant | Admitting: Physician Assistant

## 2021-08-14 ENCOUNTER — Inpatient Hospital Stay (HOSPITAL_COMMUNITY): Payer: PPO

## 2021-08-14 ENCOUNTER — Encounter (HOSPITAL_COMMUNITY): Payer: Self-pay | Admitting: Cardiovascular Disease

## 2021-08-14 DIAGNOSIS — Z952 Presence of prosthetic heart valve: Secondary | ICD-10-CM

## 2021-08-14 DIAGNOSIS — I350 Nonrheumatic aortic (valve) stenosis: Secondary | ICD-10-CM | POA: Diagnosis not present

## 2021-08-14 DIAGNOSIS — E039 Hypothyroidism, unspecified: Secondary | ICD-10-CM | POA: Diagnosis not present

## 2021-08-14 DIAGNOSIS — Z006 Encounter for examination for normal comparison and control in clinical research program: Secondary | ICD-10-CM | POA: Diagnosis not present

## 2021-08-14 DIAGNOSIS — I447 Left bundle-branch block, unspecified: Secondary | ICD-10-CM | POA: Diagnosis not present

## 2021-08-14 DIAGNOSIS — I440 Atrioventricular block, first degree: Secondary | ICD-10-CM | POA: Diagnosis not present

## 2021-08-14 DIAGNOSIS — I452 Bifascicular block: Secondary | ICD-10-CM | POA: Diagnosis not present

## 2021-08-14 LAB — BASIC METABOLIC PANEL
Anion gap: 8 (ref 5–15)
BUN: 28 mg/dL — ABNORMAL HIGH (ref 8–23)
CO2: 22 mmol/L (ref 22–32)
Calcium: 8.8 mg/dL — ABNORMAL LOW (ref 8.9–10.3)
Chloride: 106 mmol/L (ref 98–111)
Creatinine, Ser: 1.66 mg/dL — ABNORMAL HIGH (ref 0.61–1.24)
GFR, Estimated: 40 mL/min — ABNORMAL LOW (ref >60)
Glucose, Bld: 117 mg/dL — ABNORMAL HIGH (ref 70–99)
Potassium: 4.3 mmol/L (ref 3.5–5.1)
Sodium: 136 mmol/L (ref 135–145)

## 2021-08-14 LAB — CBC
HCT: 33.1 % — ABNORMAL LOW (ref 39.0–52.0)
Hemoglobin: 10.8 g/dL — ABNORMAL LOW (ref 13.0–17.0)
MCH: 32.3 pg (ref 26.0–34.0)
MCHC: 32.6 g/dL (ref 30.0–36.0)
MCV: 99.1 fL (ref 80.0–100.0)
Platelets: 119 10*3/uL — ABNORMAL LOW (ref 150–400)
RBC: 3.34 MIL/uL — ABNORMAL LOW (ref 4.22–5.81)
RDW: 12.8 % (ref 11.5–15.5)
WBC: 7.7 10*3/uL (ref 4.0–10.5)
nRBC: 0 % (ref 0.0–0.2)

## 2021-08-14 LAB — MAGNESIUM: Magnesium: 1.7 mg/dL (ref 1.7–2.4)

## 2021-08-14 MED ORDER — METOPROLOL SUCCINATE ER 50 MG PO TB24
50.0000 mg | ORAL_TABLET | Freq: Every day | ORAL | Status: DC
Start: 2021-08-14 — End: 2021-08-14
  Administered 2021-08-14: 50 mg via ORAL
  Filled 2021-08-14: qty 1

## 2021-08-14 NOTE — Progress Notes (Signed)
Pt ambulated 800 ft without any assistive device. Tolerated well. HR stayed at 90's. Bilateral groin site level zero. Wife by bedside. Care continues.

## 2021-08-14 NOTE — Progress Notes (Signed)
D/C tele and iv. Went over AVS with pt and his wife and all questions were address. Vss.   Lavenia Atlas, RN

## 2021-08-14 NOTE — Discharge Summary (Addendum)
[facility] VALVE TEAM  Discharge Summary    Patient ID: Dillon [name] MRN: [id number]; DOB: 1933/11/23  Admit date: 08/13/2021 Discharge date: 08/14/2021  Primary Care Provider: Jenel Lucks, PA-C  Primary Cardiologist: Shelva Majestic, MD / Dr. Burt Knack and Dr. Cyndia Bent (TAVR)  Discharge Diagnoses    Principal Problem:   S/P TAVR (transcatheter aortic valve replacement) Active Problems:   CAD (coronary artery disease)   Hyperlipidemia, mixed   Hypothyroid   Carotid disease, bilateral (HCC)   HTN (hypertension)   Severe aortic stenosis   Stage 3b chronic kidney disease (HCC)   1st degree AV block   Allergies No Known Allergies  Diagnostic Studies/Procedures    TAVR OPERATIVE NOTE     Date of Procedure:                08/13/2021   Preoperative Diagnosis:      Severe Aortic Stenosis    Postoperative Diagnosis:    Same    Procedure:        Transcatheter Aortic Valve Replacement - Percutaneous Right Transfemoral Approach             Edwards Sapien 3 Ultra ResiliaTHV (size 23 mm, model # 9755RSL, serial # [id number])              Co-Surgeons:                        Gaye Pollack, MD and Sherren Mocha, MD   Anesthesiologist:                  Wilfrid Lund, MD   Echocardiographer:              Viona Gilmore. O'Neal, MD   Pre-operative Echo Findings: Severe aortic stenosis Normal left ventricular systolic function   Post-operative Echo Findings: Trace paravalvular leak Normal left ventricular systolic function  _____________    Echo 08/14/21: completed but pending formal read at the time of discharge   History of Present Illness     Dillon [name] is a 88 y.o. male with a history of CAD s/p CABG x3V (2009 by [facility]) and left main PCI on 06/05/21, CKD stage IIIb, empyema s/p VATS/decortication (2017), carotid stenosis s/p R CEA (2018), HTN, mixed hyperlipidemia and severe aortic stenosis who presented to [facility] on 08/13/21 for planned TAVR.    His coronary artery bypass grafts included a LIMA to the LAD, saphenous vein graft to the obtuse marginal, and saphenous vein graft to the RCA in 2009.  In 2017 he underwent a left VATS/decortication for empyema by Dr. Darcey Nora.  He then underwent a right carotid endarterectomy by Dr. [name] in 2018.  He has had moderate aortic stenosis in the past.  His most recent echo in February 2023 showed an increase in the mean gradient to 53 mmHg with a peak gradient of 83 mmHg.  Valve area was 0.8 cm.  Left ventricular ejection fraction remains normal at 60 to 65% with mild LVH.  He has done well over the years but now reports onset of exertional fatigue and shortness of breath with moderate activities such as working in his yard.    He underwent cardiac catheterization on 05/14/2021 showing 95% eccentric ostial left main stenosis with an atretic LIMA to the distal LAD.  There is ostial occlusion of the RCA.  There was a patent vein graft supplying the left circumflex but had 40 to 50% proximal stenosis and  a patent vein graft supplying the RCA with filling of the RCA retrograde to the ostium.  He subsequently underwent PCI of the ostial left main stenosis with a DES on 06/05/2021.  He did not appreciate an improvement in symptome after PCI.    The patient has been evaluated by the multidisciplinary valve team and felt to have severe, symptomatic aortic stenosis and to be a suitable candidate for TAVR, which was set up for 08/13/21.   Hospital Course     Consultants: none   Severe AS: s/p successful TAVR with a 23 mm Edwards Sapien 3 Ultra Resilia THV via the TF approach on 08/13/21. Post operative echo completed but pending formal read. Groin sites are stable. ECG with old 1st deg AV block and new LBBB but no high grade heart block. Continue Asprin and Plavix given recent PCI. He was walked with cardiac rehab with no issues. Plan for discharge home today with close follow up in the office next week.   New  LBBB: in the setting of 1st deg AV block. Place a Zio AT to rule out HAVB  HTN: BP has been elevated. Resume home meds. Follow as an outpatient  CAD: s/p PCI of the ostial left main stenosis with a DES on 06/05/2021. Continue DAPT x 12 months. Continue high intensity statin.    CKD stage IIIb: creat stable around 1.6  HLD: continue statin _____________  Discharge Vitals Blood pressure (!) 184/62, pulse 61, temperature 98.6 F (37 C), temperature source Oral, resp. rate 17, height '5\' 5"'$  (1.651 m), weight 79.2 kg, SpO2 98 %.  Filed Weights   08/13/21 0555 08/14/21 0500  Weight: 79.4 kg 79.2 kg    GEN: Well nourished, well developed, in no acute distress HEENT: normal Neck: no JVD or masses Cardiac: RRR; very soft flow murmur. No, rubs, or gallops,no edema  Respiratory:  clear to auscultation bilaterally, normal work of breathing GI: soft, nontender, nondistended, + BS MS: no deformity or atrophy Skin: warm and dry, no rash.  Groin sites clear without hematoma or ecchymosis  Neuro:  Alert and Oriented x 3, Strength and sensation are intact Psych: euthymic mood, full affect   Labs & Radiologic Studies    CBC Recent Labs    08/13/21 1030 08/14/21 0239  WBC  --  7.7  HGB 10.2* 10.8*  HCT 30.0* 33.1*  MCV  --  99.1  PLT  --  829*   Basic Metabolic Panel Recent Labs    08/13/21 1030 08/14/21 0239  NA 141 136  K 4.6 4.3  CL 107 106  CO2  --  22  GLUCOSE 128* 117*  BUN 24* 28*  CREATININE 1.60* 1.66*  CALCIUM  --  8.8*  MG  --  1.7   Liver Function Tests No results for input(s): "AST", "ALT", "ALKPHOS", "BILITOT", "PROT", "ALBUMIN" in the last 72 hours. No results for input(s): "LIPASE", "AMYLASE" in the last 72 hours. Cardiac Enzymes No results for input(s): "CKTOTAL", "CKMB", "CKMBINDEX", "TROPONINI" in the last 72 hours. BNP Invalid input(s): "POCBNP" D-Dimer No results for input(s): "DDIMER" in the last 72 hours. Hemoglobin A1C No results for input(s):  "HGBA1C" in the last 72 hours. Fasting Lipid Panel No results for input(s): "CHOL", "HDL", "LDLCALC", "TRIG", "CHOLHDL", "LDLDIRECT" in the last 72 hours. Thyroid Function Tests No results for input(s): "TSH", "T4TOTAL", "T3FREE", "THYROIDAB" in the last 72 hours.  Invalid input(s): "FREET3" _____________  ECHOCARDIOGRAM LIMITED  Result Date: 08/13/2021    ECHOCARDIOGRAM LIMITED REPORT  Patient Name:   CATHY ROPP Date of Exam: 08/13/2021 Medical Rec #:  [id number]   Height:       65.0 in Accession #:    [id number]  Weight:       175.0 lb Date of Birth:  1934-01-27    BSA:          1.869 m Patient Age:    88 years    BP:           161/53 mmHg Patient Gender: M           HR:           62 bpm. Exam Location:  Inpatient Procedure: Limited Echo, Cardiac Doppler and Color Doppler Indications:    TAVR/ Aortic stenosis I35.0  History:        Patient has prior history of Echocardiogram examinations, most                 recent 03/19/2021. CAD, Prior CABG, Aortic Valve Disease,                 Signs/Symptoms:Murmur; Risk Factors:Hypertension.                 Aortic Valve: 23 mm Sapien prosthetic, stented (TAVR) valve is                 present in the aortic position. Procedure Date: 08/13/2021.  Sonographer:    Darlina Sicilian RDCS Referring Phys: Gordon  1. Echo guided TAVR. 23 mm S3 deployed in appropriate position. Vmax 1.2 m/s, MG 3.0 mmHG, EOA 2.77 cm2. Trivial paravalvular leak noted. No pericardial effusion. There is a 23 mm Sapien prosthetic (TAVR) valve present in the aortic position. Procedure Date: 08/13/2021.  2. Left ventricular ejection fraction, by estimation, is 60 to 65%. The left ventricle has normal function.  3. Right ventricular systolic function is normal. The right ventricular size is normal.  4. The mitral valve is degenerative. Moderate to severe mitral annular calcification. FINDINGS  Left Ventricle: Left ventricular ejection fraction, by estimation, is 60 to 65%. The  left ventricle has normal function. Right Ventricle: The right ventricular size is normal. No increase in right ventricular wall thickness. Right ventricular systolic function is normal. Pericardium: There is no evidence of pericardial effusion. Mitral Valve: The mitral valve is degenerative in appearance. Moderate to severe mitral annular calcification. Aortic Valve: Echo guided TAVR. 23 mm S3 deployed in appropriate position. Vmax 1.2 m/s, MG 3.0 mmHG, EOA 2.77 cm2. Trivial paravalvular leak noted. No pericardial effusion. Aortic valve mean gradient measures 3.0 mmHg. Aortic valve peak gradient measures 5.9 mmHg. Aortic valve area, by VTI measures 2.77 cm. There is a 23 mm Sapien prosthetic, stented (TAVR) valve present in the aortic position. Procedure Date: 08/13/2021. LEFT VENTRICLE PLAX 2D LVOT diam:     2.05 cm LV SV:         84 LV SV Index:   45 LVOT Area:     3.30 cm  AORTIC VALVE AV Area (Vmax):    3.03 cm AV Area (Vmean):   1.25 cm AV Area (VTI):     2.77 cm AV Vmax:           121.00 cm/s AV Vmean:          191.200 cm/s AV VTI:            0.302 m AV Peak Grad:      5.9 mmHg AV Mean Grad:  3.0 mmHg LVOT Vmax:         111.00 cm/s LVOT Vmean:        72.400 cm/s LVOT VTI:          0.253 m LVOT/AV VTI ratio: 0.84  SHUNTS Systemic VTI:  0.25 m Systemic Diam: 2.05 cm Eleonore Chiquito MD Electronically signed by Eleonore Chiquito MD Signature Date/Time: 08/13/2021/10:06:46 AM    Final    Structural Heart Procedure  Result Date: 08/13/2021 See surgical note for result.  DG Chest 2 View  Result Date: 08/10/2021 CLINICAL DATA:  Aortic stenosis EXAM: CHEST - 2 VIEW COMPARISON:  05/03/2021 FINDINGS: Heart size within normal limits. No pulmonary vascular congestion. Postsurgical changes of CABG are again seen. Multiple left rib deformities are unchanged from prior exam and consistent with remote healed fracture. Lungs are clear. IMPRESSION: No acute cardiopulmonary process. Electronically Signed   By: Miachel Roux M.D.   On: 08/10/2021 19:19    Disposition   Pt is being discharged home today in good condition.  Follow-up Plans & Appointments     Follow-up Information     Eileen Stanford, PA-C. Go on 08/21/2021.   Specialties: Cardiology, Radiology Why: @ 2pm, please arrive at least 10 minutes early. Contact information: [address] [place] [address] [phone]                Discharge Instructions     Diet - low sodium heart healthy   Complete by: As directed    Increase activity slowly   Complete by: As directed    No wound care   Complete by: As directed    Remove dressing in 24 hours   Complete by: As directed        Discharge Medications   Allergies as of 08/14/2021   No Known Allergies      Medication List     TAKE these medications    amLODipine 5 MG tablet Commonly known as: NORVASC Take 1 tablet by mouth daily   aspirin EC 81 MG tablet Take 81 mg by mouth at bedtime.   B-12 2000 MCG Tabs Take 2,000 mcg by mouth daily.   clopidogrel 75 MG tablet Commonly known as: PLAVIX Take 1 tablet (75 mg total) by mouth daily.   fexofenadine 180 MG tablet Commonly known as: ALLEGRA Take 180 mg by mouth daily.   folic acid 1 MG tablet Commonly known as: FOLVITE Take 1 tablet (1 mg total) by mouth daily.   levothyroxine 112 MCG tablet Commonly known as: SYNTHROID Take 112 mcg by mouth daily before breakfast.   metoprolol succinate 50 MG 24 hr tablet Commonly known as: TOPROL-XL Take 1 tablet (50 mg total) by mouth daily. Take with or immediately following a meal.   mupirocin cream 2 % Commonly known as: BACTROBAN Apply 1 application topically daily as needed (after skin treatments).   Omega-3 1400 MG Caps Take 1,400 mg by mouth daily.   rosuvastatin 40 MG tablet Commonly known as: CRESTOR Take 1 tablet by mouth every day   terbinafine 1 % cream Commonly known as: LAMISIL Apply 1 application topically daily as  needed (rash).   Vitamin D 50 MCG (2000 UT) tablet Take 2,000 Units by mouth at bedtime.         Outstanding Labs/Studies   none  Duration of Discharge Encounter   Greater than 30 minutes including physician time.  SignedAngelena Form, PA-C 08/14/2021, 12:04 PM [phone]   Patient seen,  examined. Available data reviewed. Agree with findings, assessment, and plan as outlined by Nell Range, PA-C.  The patient is independently interviewed and examined.  His wife is at the bedside.  He is alert, oriented, no distress.  HEENT is normal, JVP is normal, lungs are clear bilaterally, heart is regular rate and rhythm with 2/6 systolic ejection murmur at the right upper sternal border, abdomen is soft and nontender, extremities have no edema, bilateral groin sites have mild ecchymoses with no firm hematoma.  The patient's echo from this morning is reviewed and demonstrates normal LV function, no pericardial effusion, and normal function of his TAVR bioprosthesis with a mean transvalvular gradient ranging from 10 to 13 mmHg and no paravalvular regurgitation.  The patient is clinically stable and ready for discharge from the hospital.  He does have a new left bundle branch block and it is appropriate for outpatient telemetry.  He has ambulated with cardiac rehab without problems.  He is having no lightheadedness or presyncope.  He will be discharged today with hospital follow-up arranged next week.  He will go home with a live ZIO monitor to evaluate his heart rhythm further.  Sherren Mocha, M.D. 08/14/2021 4:31 PM

## 2021-08-14 NOTE — Progress Notes (Signed)
Pt's BP was 162/49 left arm, 172/52 right arm. Nitro drip started at 22mg/min as ordered. Within 5 min of drip, BP dropped to 158/73, and 130/56 in about 10 min. Stopped the drip for 5 min, pressure went up to 148/54. Drip started at 2.512m/min, 5 min after pressure was 137/53. Pt ao x4. Denies any pain or discomfort. Pt's wife by bedside. Care continues.

## 2021-08-14 NOTE — Progress Notes (Signed)
Pt's bp 184/62. Pt asymptomatic. Administered metoprolol 50 mg. NP and MD notified.   Lavenia Atlas, RN

## 2021-08-14 NOTE — Progress Notes (Signed)
CARDIAC REHAB PHASE I   Offered to walk with pt, pt with recent ambulation with MT. Pt concerned over BP, but otherwise feels good. Pt and spouse educated on site care, restrictions, and exercise guidelines. Pt declines CRP II at this time. Will f/u later if pt remains in hospital.  [phone] Rufina Falco, RN BSN 08/14/2021 11:15 AM

## 2021-08-14 NOTE — Progress Notes (Signed)
Applied in hospital  Dr. Claiborne Billings to read.

## 2021-08-14 NOTE — Progress Notes (Signed)
Mobility Specialist - Progress Note   08/14/21 1042  Mobility  Activity Ambulated independently in hallway  Level of Assistance Independent  Assistive Device None  Distance Ambulated (ft) 470 ft  Activity Response Tolerated well  $Mobility charge 1 Mobility   Pre-mobility: 66 HR During mobility: 87 HR Post-mobility: 70 HR  Pt received in bed and agreeable to mobility. Pt asymptomatic throughout ambulation. Returned to bed w/ call bell in reach.

## 2021-08-14 NOTE — Progress Notes (Signed)
  Echocardiogram 2D Echocardiogram has been performed.  Darlina Sicilian M 08/14/2021, 9:10 AM

## 2021-08-15 ENCOUNTER — Telehealth: Payer: Self-pay | Admitting: Physician Assistant

## 2021-08-15 LAB — ECHOCARDIOGRAM COMPLETE
AR max vel: 2.23 cm2
AV Area VTI: 2.36 cm2
AV Area mean vel: 2.26 cm2
AV Mean grad: 12 mmHg
AV Peak grad: 21.8 mmHg
Ao pk vel: 2.33 m/s
Area-P 1/2: 2.04 cm2
Height: 65 in
S' Lateral: 3.3 cm
Weight: 2795.2 oz

## 2021-08-15 NOTE — Telephone Encounter (Signed)
  [place] VALVE TEAM   Patient contacted regarding discharge from [facility] on 7/19  Patient understands to follow up with a structural heart APP on 7/26 at [place].  Patient understands discharge instructions? yes Patient understands medications and regimen? yes Patient understands to bring all medications to this visit? yes  Angelena Form PA-C  [facility]

## 2021-08-19 ENCOUNTER — Other Ambulatory Visit: Payer: Self-pay | Admitting: Cardiovascular Disease

## 2021-08-19 NOTE — Progress Notes (Unsigned)
HE[facility] AND [place]                                     Cardiology Office Note:    Date:  08/21/2021   ID:  Dillon [name], DOB 1933/06/01, MRN [id number]  PCP:  Jenel Lucks, [facility] Cardiologist:  Shelva Majestic, MD / Dr. Burt Knack and Dr. Cyndia Bent (TAVR) [facility] Electrophysiologist:  None   Referring MD: Burna Cash*   TOC s/p TAVR  History of Present Illness:    Dillon [name] is a 88 y.o. male with a hx of CAD s/p CABG x3V (2009 by [facility]) and left main PCI on 06/05/21, CKD stage IIIb, empyema s/p VATS/decortication (2017), carotid stenosis s/p R CEA (2018), HTN, mixed hyperlipidemia and severe aortic stenosis s/p TAVR (08/13/21) who presents to clinic for follow up.  He has a history of CAD s/p CABG. His coronary artery bypass grafts included a LIMA to the LAD, saphenous vein graft to the obtuse marginal, and saphenous vein graft to the RCA in 2009.  In 2017 he underwent a left VATS/decortication for empyema by Dr. Darcey Nora.  He then underwent a right carotid endarterectomy by Dr. [name] in 2018.  He has had moderate aortic stenosis in the past.  His most recent echo in February 2023 showed an increase in the mean gradient to 53 mmHg with a peak gradient of 83 mmHg. Valve area was 0.8 cm.  Left ventricular ejection fraction remains normal at 60 to 65% with mild LVH.  He has done well over the years but now reports onset of exertional fatigue and shortness of breath with moderate activities such as working in his yard.    He underwent cardiac catheterization on 05/14/2021 showing 95% eccentric ostial left main stenosis with an atretic LIMA to the distal LAD.  There was ostial occlusion of the RCA.  There was a patent vein graft supplying the left circumflex but had 40 to 50% proximal stenosis and a patent vein graft supplying the RCA with filling of the RCA retrograde to the ostium.  He subsequently underwent PCI of  the ostial left main stenosis with a DES on 06/05/2021.  He did not appreciate an improvement in symptoms after PCI.     The patient has been evaluated by the multidisciplinary valve team and underwent successful TAVR with a 23 mm Edwards Sapien 3 Ultra Resilia THV via the TF approach on 08/13/21. Post operative echo showed EF 60%, moderate asymmetric LVH, normally functioning TAVR with a mean gradient of 12 mmHg and trivial PVL. Given new LBBB, a Zio AT was placed. He was discharged on continue DAPT with aspirin and plavix given recent PCI. Bp was noted to be elevated, plan was to resume home meds and follow as an outpatient.   Today the patient presents to clinic for follow up. No CP or SOB. No LE edema, orthopnea or PND. No dizziness or syncope. No blood in stool or urine. No palpitations. Just so thankful to team for care he got.     Past Medical History:  Diagnosis Date   CAD (coronary artery disease)    History of hiatal hernia    Hypertension    Hypothyroidism    Pneumonia 12/2015   hx   S/P CABG x 3 12/17/2007   LIMA to LAD,SVG to left C   S/P TAVR (transcatheter  aortic valve replacement) 08/13/2021   s/p TAVR with a 23 mm Edwards S3UR via the TF approach by Dr. Burt Knack & Dr. Cyndia Bent   Severe aortic stenosis    s/p TAVR    Past Surgical History:  Procedure Laterality Date   CORONARY ARTERY BYPASS GRAFT  12/17/07   LIMA to LAD,vein to obtuse marginal,vein to RCA   CORONARY STENT INTERVENTION N/A 06/05/2021   Procedure: CORONARY STENT INTERVENTION;  Surgeon: Sherren Mocha, MD;  Location: [facility];  Service: Cardiovascular;  Laterality: N/A;   EMPYEMA DRAINAGE Left 01/11/2016   Procedure: EMPYEMA DRAINAGE;  Surgeon: Ivin Poot, MD;  Location: [facility];  Service: Thoracic;  Laterality: Left;   ENDARTERECTOMY Right 03/26/2016   Procedure: RIGHT CAROTID ENDARTERECTOMY;  Surgeon: Serafina Mitchell, MD;  Location: [facility];  Service: Vascular;  Laterality: Right;   INTRAOPERATIVE  TRANSTHORACIC ECHOCARDIOGRAM N/A 08/13/2021   Procedure: INTRAOPERATIVE TRANSTHORACIC ECHOCARDIOGRAM;  Surgeon: Sherren Mocha, MD;  Location: [facility];  Service: Open Heart Surgery;  Laterality: N/A;   PATCH ANGIOPLASTY Right 03/26/2016   Procedure: PATCH ANGIOPLASTY USING Rueben Bash BIOLOGIC PATCH;  Surgeon: Serafina Mitchell, MD;  Location: [place];  Service: Vascular;  Laterality: Right;   RIGHT HEART CATH AND CORONARY/GRAFT ANGIOGRAPHY N/A 05/14/2021   Procedure: RIGHT HEART CATH AND CORONARY/GRAFT ANGIOGRAPHY;  Surgeon: Troy Sine, MD;  Location: [facility];  Service: Cardiovascular;  Laterality: N/A;   TRANSCATHETER AORTIC VALVE REPLACEMENT, TRANSFEMORAL N/A 08/13/2021   Procedure: Transcatheter Aortic Valve Replacement, Transfemoral;  Surgeon: Sherren Mocha, MD;  Location: [place];  Service: Open Heart Surgery;  Laterality: N/A;   ULTRASOUND GUIDANCE FOR VASCULAR ACCESS Bilateral 08/13/2021   Procedure: ULTRASOUND GUIDANCE FOR VASCULAR ACCESS, BILATERAL FEMORAL ARTERIES AND LEFT FEMORAL VEIN;  Surgeon: Sherren Mocha, MD;  Location: [place];  Service: Open Heart Surgery;  Laterality: Bilateral;   VIDEO ASSISTED THORACOSCOPY (VATS)/DECORTICATION Left 01/11/2016   Procedure: VIDEO ASSISTED THORACOSCOPY (VATS)/DECORTICATION;  Surgeon: Ivin Poot, MD;  Location: [facility];  Service: Thoracic;  Laterality: Left;    Current Medications: Current Meds  Medication Sig   amLODipine (NORVASC) 10 MG tablet Take 1 tablet (10 mg total) by mouth daily.   aspirin EC 81 MG tablet Take 81 mg by mouth at bedtime.    Cholecalciferol (VITAMIN D) 2000 units tablet Take 2,000 Units by mouth at bedtime.   clopidogrel (PLAVIX) 75 MG tablet Take 1 tablet by mouth once daily   Cyanocobalamin (B-12) 2000 MCG TABS Take 2,000 mcg by mouth daily.   fexofenadine (ALLEGRA) 180 MG tablet Take 180 mg by mouth daily.   folic acid (FOLVITE) 1 MG tablet Take 1 tablet (1 mg total) by mouth daily.   levothyroxine  (SYNTHROID, LEVOTHROID) 112 MCG tablet Take 112 mcg by mouth daily before breakfast.   metoprolol succinate (TOPROL-XL) 50 MG 24 hr tablet Take 1 tablet (50 mg total) by mouth daily. Take with or immediately following a meal.   mupirocin cream (BACTROBAN) 2 % Apply 1 application topically daily as needed (after skin treatments).    Omega-3 1400 MG CAPS Take 1,400 mg by mouth daily.   rosuvastatin (CRESTOR) 40 MG tablet Take 1 tablet by mouth every day   terbinafine (LAMISIL) 1 % cream Apply 1 application topically daily as needed (rash).    [DISCONTINUED] amLODipine (NORVASC) 5 MG tablet Take 1 tablet by mouth daily     Allergies:   Patient has no known allergies.   Social History   Socioeconomic  History   Marital status: Married    Spouse name: Not on file   Number of children: 0   Years of education: Not on file   Highest education level: Not on file  Occupational History   Not on file  Tobacco Use   Smoking status: Former    Types: Cigarettes    Quit date: 01/28/1971    Years since quitting: 50.5    Passive exposure: Never   Smokeless tobacco: Never  Vaping Use   Vaping Use: Never used  Substance and Sexual Activity   Alcohol use: Yes    Alcohol/week: 14.0 standard drinks of alcohol    Types: 14 Standard drinks or equivalent per week    Comment: 1-2 drinks per day (mixture of different types)   Drug use: No   Sexual activity: Not on file  Other Topics Concern   Not on file  Social History Narrative   Not on file   Social Determinants of Health   Financial Resource Strain: Not on file  Food Insecurity: Not on file  Transportation Needs: Not on file  Physical Activity: Not on file  Stress: Not on file  Social Connections: Not on file     Family History: The patient's family history includes Heart attack in his father.  ROS:   Please see the history of present illness.    All other systems reviewed and are negative.  EKGs/Labs/Other Studies Reviewed:    The  following studies were reviewed today:  TAVR OPERATIVE NOTE     Date of Procedure:                08/13/2021   Preoperative Diagnosis:      Severe Aortic Stenosis    Postoperative Diagnosis:    Same    Procedure:        Transcatheter Aortic Valve Replacement - Percutaneous Right Transfemoral Approach             Edwards Sapien 3 Ultra ResiliaTHV (size 23 mm, model # 9755RSL, serial # [id number])              Co-Surgeons:                        Gaye Pollack, MD and Sherren Mocha, MD   Anesthesiologist:                  Wilfrid [place], MD   Echocardiographer:              Viona [place]. O'Neal, MD   Pre-operative Echo Findings: Severe aortic stenosis Normal left ventricular systolic function   Post-operative Echo Findings: Trace paravalvular leak Normal left ventricular systolic function   _____________     Echo 08/14/21: IMPRESSIONS   1. 23 mm S3. Vmax 2.3 m/s, MG 12.0 mmHG, EOA 2.36 cm2, DI 0.66. Trivial  paravalvular leak noted. The aortic valve has been repaired/replaced.  Aortic valve regurgitation is trivial. There is a 23 mm Sapien prosthetic  (TAVR) valve present in the aortic  position. Procedure Date: 08/13/2021. Echo findings are consistent with  normal structure and function of the aortic valve prosthesis.   2. Left ventricular ejection fraction, by estimation, is 60 to 65%. The  left ventricle has normal function. The left ventricle has no regional  wall motion abnormalities. There is moderate asymmetric left ventricular  hypertrophy of the basal-septal  segment. Left ventricular diastolic parameters are consistent with Grade I  diastolic dysfunction (impaired relaxation).  3. Right ventricular systolic function is normal. The right ventricular  size is normal. Tricuspid regurgitation signal is inadequate for assessing  PA pressure.   4. The mitral valve is degenerative. Trivial mitral valve regurgitation.  Moderate to severe mitral annular calcification.   5. The  inferior vena cava is normal in size with greater than 50%  respiratory variability, suggesting right atrial pressure of 3 mmHg.   EKG:  EKG is ordered today.  The ekg ordered today demonstrates sinus brady with 1st deg AV block and LBBB, HR 55  Recent Labs: 08/09/2021: ALT 14 08/14/2021: BUN 28; Creatinine, Ser 1.66; Hemoglobin 10.8; Magnesium 1.7; Platelets 119; Potassium 4.3; Sodium 136  Recent Lipid Panel    Component Value Date/Time   CHOL 145 01/18/2014 0803   CHOL 139 08/30/2012 0845   TRIG 70 01/18/2014 0803   TRIG 82 08/30/2012 0845   HDL 64 01/18/2014 0803   HDL 68 08/30/2012 0845   CHOLHDL 2.3 01/18/2014 0803   VLDL 14 01/18/2014 0803   LDLCALC 67 01/18/2014 0803   LDLCALC 55 08/30/2012 0845     Risk Assessment/Calculations:       Physical Exam:    VS:  BP (!) 150/70 (BP Location: Left Arm, Patient Position: Sitting, Cuff Size: Normal)   Pulse (!) 55   Ht '5\' 5"'$  (1.651 m)   Wt 176 lb (79.8 kg)   SpO2 94%   BMI 29.29 kg/m     Wt Readings from Last 3 Encounters:  08/21/21 176 lb (79.8 kg)  08/14/21 174 lb 11.2 oz (79.2 kg)  08/09/21 175 lb (79.4 kg)     GEN:  Well nourished, well developed in no acute distress HEENT: Normal NECK: No JVD LYMPHATICS: No lymphadenopathy CARDIAC: RRR, no murmurs, rubs, gallops RESPIRATORY:  Clear to auscultation without rales, wheezing or rhonchi  ABDOMEN: Soft, non-tender, non-distended MUSCULOSKELETAL:  No edema; No deformity  SKIN: Warm and dry NEUROLOGIC:  Alert and oriented x 3 PSYCHIATRIC:  Normal affect   ASSESSMENT:    1. S/P TAVR (transcatheter aortic valve replacement)   2. LBBB (left bundle branch block)   3. Primary hypertension   4. CAD in native artery   5. Stage 3b chronic kidney disease ([place])   6. Hyperlipidemia, mixed    PLAN:    In order of problems listed above:  Severe AS s/p TAVR: doing great 1 week out from TAVR. ECG with no HAVB. Groin sites are stable. SBE prophylaxis discussed; he says he  can get abx from the dentist as his wife does this routinely. Continue Asprin and Plavix given recent PCI. I will see him back next month for follow up and echo.    LBBB: ECG today with sinus brady with 1st deg AV block and LBBB, HR 55. Wearing Zio AT with no HAVB.    HTN: BP elevated today. Will increase Norvasc to '10mg'$  daily. Continue Toprol XL '50mg'$  daily. No ACE/ARB given advanced CKD.   CAD: s/p PCI of the ostial left main stenosis with a DES on 06/05/2021. Continue DAPT x 12 months. Continue high intensity statin.    CKD stage IIIb: creat ~1.8.  He follows with nephrologist tomorrow.    HLD: continue statin.    Medication Adjustments/Labs and Tests Ordered: Current medicines are reviewed at length with the patient today.  Concerns regarding medicines are outlined above.  Orders Placed This Encounter  Procedures   EKG 12-Lead   Meds ordered this encounter  Medications   amLODipine (NORVASC) 10  MG tablet    Sig: Take 1 tablet (10 mg total) by mouth daily.    Dispense:  180 tablet    Refill:  3    Patient Instructions  Medication Instructions:  Increase Amlodipine to 10 mg daily   *If you need a refill on your cardiac medications before your next appointment, please call your pharmacy*   Lab Work: None ordered   If you have labs (blood work) drawn today and your tests are completely normal, you will receive your results only by: [place] (if you have MyChart) OR A paper copy in the mail If you have any lab test that is abnormal or we need to change your treatment, we will call you to review the results.   Testing/Procedures: None ordered today    Follow-Up: Follow up as scheduled    Other Instructions   Important Information About Sugar         Signed, Angelena Form, PA-C  08/21/2021 2:32 PM    [place]

## 2021-08-21 ENCOUNTER — Ambulatory Visit: Payer: PPO

## 2021-08-21 ENCOUNTER — Ambulatory Visit: Payer: PPO | Admitting: Physician Assistant

## 2021-08-21 VITALS — BP 150/70 | HR 55 | Ht 65.0 in | Wt 176.0 lb

## 2021-08-21 DIAGNOSIS — I447 Left bundle-branch block, unspecified: Secondary | ICD-10-CM | POA: Diagnosis not present

## 2021-08-21 DIAGNOSIS — N1832 Chronic kidney disease, stage 3b: Secondary | ICD-10-CM

## 2021-08-21 DIAGNOSIS — Z952 Presence of prosthetic heart valve: Secondary | ICD-10-CM

## 2021-08-21 DIAGNOSIS — I10 Essential (primary) hypertension: Secondary | ICD-10-CM

## 2021-08-21 DIAGNOSIS — E782 Mixed hyperlipidemia: Secondary | ICD-10-CM

## 2021-08-21 DIAGNOSIS — I2510 Atherosclerotic heart disease of native coronary artery without angina pectoris: Secondary | ICD-10-CM

## 2021-08-21 MED ORDER — AMLODIPINE BESYLATE 10 MG PO TABS: 10.0000 mg | ORAL_TABLET | Freq: Every day | ORAL | 3 refills | Status: DC

## 2021-08-21 NOTE — Patient Instructions (Signed)
Medication Instructions:  Increase Amlodipine to 10 mg daily   *If you need a refill on your cardiac medications before your next appointment, please call your pharmacy*   Lab Work: None ordered   If you have labs (blood work) drawn today and your tests are completely normal, you will receive your results only by: [place] (if you have MyChart) OR A paper copy in the mail If you have any lab test that is abnormal or we need to change your treatment, we will call you to review the results.   Testing/Procedures: None ordered today    Follow-Up: Follow up as scheduled    Other Instructions   Important Information About Sugar

## 2021-08-23 ENCOUNTER — Telehealth: Payer: Self-pay | Admitting: Cardiovascular Disease

## 2021-08-23 NOTE — Telephone Encounter (Signed)
Pt c/o medication issue:  1. Name of Medication:   amLODipine (NORVASC) 10 MG tablet    2. How are you currently taking this medication (dosage and times per day)? Take 1 tablet (10 mg total) by mouth daily.  3. Are you having a reaction (difficulty breathing--STAT)? No  4. What is your medication issue? Needing clarification on dosage and instructions. States that the last script they have was for 5 MG and total quantity of 180 tablets as if pt should be taking 2 tablets daily. Please advise

## 2021-08-23 NOTE — Telephone Encounter (Signed)
Spoke with pharmacist Loma Sousa at [facility] to clarify amlodipine prescription. Gave prescription for amlodipine '10mg'$  daily.

## 2021-08-27 ENCOUNTER — Other Ambulatory Visit (HOSPITAL_COMMUNITY): Payer: PPO

## 2021-09-03 NOTE — Addendum Note (Signed)
Encounter addended by: Markus Daft A on: 09/03/2021 6:34 PM  Actions taken: Imaging Exam ended

## 2021-09-11 ENCOUNTER — Ambulatory Visit (HOSPITAL_COMMUNITY): Payer: PPO | Attending: Cardiology

## 2021-09-11 ENCOUNTER — Ambulatory Visit: Payer: PPO

## 2021-09-11 ENCOUNTER — Ambulatory Visit: Payer: PPO | Admitting: Physician Assistant

## 2021-09-11 VITALS — BP 136/52 | HR 56 | Ht 66.0 in | Wt 179.0 lb

## 2021-09-11 DIAGNOSIS — E782 Mixed hyperlipidemia: Secondary | ICD-10-CM

## 2021-09-11 DIAGNOSIS — I447 Left bundle-branch block, unspecified: Secondary | ICD-10-CM | POA: Diagnosis not present

## 2021-09-11 DIAGNOSIS — I2510 Atherosclerotic heart disease of native coronary artery without angina pectoris: Secondary | ICD-10-CM

## 2021-09-11 DIAGNOSIS — I10 Essential (primary) hypertension: Secondary | ICD-10-CM

## 2021-09-11 DIAGNOSIS — Z952 Presence of prosthetic heart valve: Secondary | ICD-10-CM | POA: Diagnosis not present

## 2021-09-11 DIAGNOSIS — N1832 Chronic kidney disease, stage 3b: Secondary | ICD-10-CM

## 2021-09-11 DIAGNOSIS — Z951 Presence of aortocoronary bypass graft: Secondary | ICD-10-CM | POA: Diagnosis present

## 2021-09-11 DIAGNOSIS — I350 Nonrheumatic aortic (valve) stenosis: Secondary | ICD-10-CM | POA: Diagnosis present

## 2021-09-11 LAB — ECHOCARDIOGRAM COMPLETE
AR max vel: 1.13 cm2
AV Area VTI: 1.11 cm2
AV Area mean vel: 1.2 cm2
AV Mean grad: 16 mmHg
AV Peak grad: 34.6 mmHg
Ao pk vel: 2.94 m/s
Area-P 1/2: 2.25 cm2
S' Lateral: 3.2 cm

## 2021-09-11 NOTE — Patient Instructions (Signed)
Medication Instructions:  Your physician recommends that you continue on your current medications as directed. Please refer to the Current Medication list given to you today.  *If you need a refill on your cardiac medications before your next appointment, please call your pharmacy*   Lab Work: NONE If you have labs (blood work) drawn today and your tests are completely normal, you will receive your results only by: [place] (if you have MyChart) OR A paper copy in the mail If you have any lab test that is abnormal or we need to change your treatment, we will call you to review the results.   Testing/Procedures: NONE   Follow-Up: At [facility], you and your health needs are our priority.  As part of our continuing mission to provide you with exceptional heart care, we have created designated Provider Care Teams.  These Care Teams include your primary Cardiologist (physician) and Advanced Practice Providers (APPs -  Physician Assistants and Nurse Practitioners) who all work together to provide you with the care you need, when you need it.  We recommend signing up for the patient portal called "MyChart".  Sign up information is provided on this After Visit Summary.  MyChart is used to connect with patients for Virtual Visits (Telemedicine).  Patients are able to view lab/test results, encounter notes, upcoming appointments, etc.  Non-urgent messages can be sent to your provider as well.   To learn more about what you can do with MyChart, go to [email or web address].    Your next appointment:   KEEP SCHEDULE FOLLOW-UP  Important Information About Sugar

## 2021-09-11 NOTE — Progress Notes (Unsigned)
[facility] [place]                                     Cardiology Office Note:    Date:  09/12/2021   ID:  Dillon [name], DOB March 11, 1933, MRN [id number]  PCP:  Jenel Lucks, [facility] Cardiologist:  Shelva Majestic, MD / Dr. Burt Knack and Dr. Cyndia Bent (TAVR) [facility] Electrophysiologist:  None   Referring MD: Burna Cash*   1 month s/p TAVR  History of Present Illness:    Dillon [name] is a 88 y.o. male with a hx of CAD s/p CABG x3V (2009 by [facility]) and left main PCI on 06/05/21, CKD stage IIIb, empyema s/p VATS/decortication (2017), carotid stenosis s/p R CEA (2018), HTN, mixed hyperlipidemia and severe aortic stenosis s/p TAVR (08/13/21) who presents to clinic for follow up.  He has a history of CAD s/p CABG. His coronary artery bypass grafts included a LIMA to the LAD, saphenous vein graft to the obtuse marginal, and saphenous vein graft to the RCA in 2009.  In 2017 he underwent a left VATS/decortication for empyema by Dr. Darcey Nora.  He then underwent a right carotid endarterectomy by Dr. [name] in 2018.  He has had moderate aortic stenosis in the past.  His most recent echo in February 2023 showed an increase in the mean gradient to 53 mmHg with a peak gradient of 83 mmHg. Valve area was 0.8 cm.  Left ventricular ejection fraction remains normal at 60 to 65% with mild LVH.  He has done well over the years but now reports onset of exertional fatigue and shortness of breath with moderate activities such as working in his yard.    He underwent cardiac catheterization on 05/14/2021 showing 95% eccentric ostial left main stenosis with an atretic LIMA to the distal LAD.  There was ostial occlusion of the RCA.  There was a patent vein graft supplying the left circumflex but had 40 to 50% proximal stenosis and a patent vein graft supplying the RCA with filling of the RCA retrograde to the ostium.  He subsequently underwent PCI  of the ostial left main stenosis with a DES on 06/05/2021.  He did not appreciate an improvement in symptoms after PCI.     The patient has been evaluated by the multidisciplinary valve team and underwent successful TAVR with a 23 mm Edwards Sapien 3 Ultra Resilia THV via the TF approach on 08/13/21. Post operative echo showed EF 60%, moderate asymmetric LVH, normally functioning TAVR with a mean gradient of 12 mmHg and trivial PVL. Given new LBBB, a Zio AT was placed. He was discharged on continue DAPT with aspirin and plavix given recent PCI. Bp was noted to be elevated, plan was to resume home meds and follow as an outpatient.   At follow up BP was elevated and Norvsc increased from 5 to 36m daily.   Today the patient presents to clinic for follow up. No CP or SOB. No LE edema, orthopnea or PND. No dizziness or syncope. No blood in stool or urine. No palpitations.   Past Medical History:  Diagnosis Date   CAD (coronary artery disease)    History of hiatal hernia    Hypertension    Hypothyroidism    Pneumonia 12/2015   hx   S/P CABG x 3 12/17/2007   LIMA to LAD,SVG to left  C   S/P TAVR (transcatheter aortic valve replacement) 08/13/2021   s/p TAVR with a 23 mm Edwards S3UR via the TF approach by Dr. Burt Knack & Dr. Cyndia Bent   Severe aortic stenosis    s/p TAVR    Past Surgical History:  Procedure Laterality Date   CORONARY ARTERY BYPASS GRAFT  12/17/07   LIMA to LAD,vein to obtuse marginal,vein to RCA   CORONARY STENT INTERVENTION N/A 06/05/2021   Procedure: CORONARY STENT INTERVENTION;  Surgeon: Sherren Mocha, MD;  Location: [place] [facility];  Service: Cardiovascular;  Laterality: N/A;   EMPYEMA DRAINAGE Left 01/11/2016   Procedure: EMPYEMA DRAINAGE;  Surgeon: Ivin Poot, MD;  Location: [place];  Service: Thoracic;  Laterality: Left;   ENDARTERECTOMY Right 03/26/2016   Procedure: RIGHT CAROTID ENDARTERECTOMY;  Surgeon: Serafina Mitchell, MD;  Location: [place];  Service: Vascular;   Laterality: Right;   INTRAOPERATIVE TRANSTHORACIC ECHOCARDIOGRAM N/A 08/13/2021   Procedure: INTRAOPERATIVE TRANSTHORACIC ECHOCARDIOGRAM;  Surgeon: Sherren Mocha, MD;  Location: [place];  Service: Open Heart Surgery;  Laterality: N/A;   PATCH ANGIOPLASTY Right 03/26/2016   Procedure: PATCH ANGIOPLASTY USING Rueben Bash BIOLOGIC PATCH;  Surgeon: Serafina Mitchell, MD;  Location: [place];  Service: Vascular;  Laterality: Right;   RIGHT HEART CATH AND CORONARY/GRAFT ANGIOGRAPHY N/A 05/14/2021   Procedure: RIGHT HEART CATH AND CORONARY/GRAFT ANGIOGRAPHY;  Surgeon: Troy Sine, MD;  Location: [place] [facility];  Service: Cardiovascular;  Laterality: N/A;   TRANSCATHETER AORTIC VALVE REPLACEMENT, TRANSFEMORAL N/A 08/13/2021   Procedure: Transcatheter Aortic Valve Replacement, Transfemoral;  Surgeon: Sherren Mocha, MD;  Location: [place];  Service: Open Heart Surgery;  Laterality: N/A;   ULTRASOUND GUIDANCE FOR VASCULAR ACCESS Bilateral 08/13/2021   Procedure: ULTRASOUND GUIDANCE FOR VASCULAR ACCESS, BILATERAL FEMORAL ARTERIES AND LEFT FEMORAL VEIN;  Surgeon: Sherren Mocha, MD;  Location: [place];  Service: Open Heart Surgery;  Laterality: Bilateral;   VIDEO ASSISTED THORACOSCOPY (VATS)/DECORTICATION Left 01/11/2016   Procedure: VIDEO ASSISTED THORACOSCOPY (VATS)/DECORTICATION;  Surgeon: Ivin Poot, MD;  Location: [facility];  Service: Thoracic;  Laterality: Left;    Current Medications: Current Meds  Medication Sig   amLODipine (NORVASC) 10 MG tablet Take 1 tablet (10 mg total) by mouth daily.   aspirin EC 81 MG tablet Take 81 mg by mouth at bedtime.    Cholecalciferol (VITAMIN D) 2000 units tablet Take 2,000 Units by mouth at bedtime.   clopidogrel (PLAVIX) 75 MG tablet Take 1 tablet by mouth once daily   Cyanocobalamin (B-12) 2000 MCG TABS Take 2,000 mcg by mouth daily.   fexofenadine (ALLEGRA) 180 MG tablet Take 180 mg by mouth daily.   folic acid (FOLVITE) 1 MG tablet Take 1 tablet (1 mg total) by  mouth daily.   levothyroxine (SYNTHROID, LEVOTHROID) 112 MCG tablet Take 112 mcg by mouth daily before breakfast.   metoprolol succinate (TOPROL-XL) 50 MG 24 hr tablet Take 1 tablet (50 mg total) by mouth daily. Take with or immediately following a meal.   mupirocin cream (BACTROBAN) 2 % Apply 1 application topically daily as needed (after skin treatments).    Omega-3 1400 MG CAPS Take 1,400 mg by mouth daily.   rosuvastatin (CRESTOR) 40 MG tablet Take 1 tablet by mouth every day   terbinafine (LAMISIL) 1 % cream Apply 1 application topically daily as needed (rash).      Allergies:   Patient has no known allergies.   Social History   Socioeconomic History   Marital status: Married  Spouse name: Not on file   Number of children: 0   Years of education: Not on file   Highest education level: Not on file  Occupational History   Not on file  Tobacco Use   Smoking status: Former    Types: Cigarettes    Quit date: 01/28/1971    Years since quitting: 50.6    Passive exposure: Never   Smokeless tobacco: Never  Vaping Use   Vaping Use: Never used  Substance and Sexual Activity   Alcohol use: Yes    Alcohol/week: 14.0 standard drinks of alcohol    Types: 14 Standard drinks or equivalent per week    Comment: 1-2 drinks per day (mixture of different types)   Drug use: No   Sexual activity: Not on file  Other Topics Concern   Not on file  Social History Narrative   Not on file   Social Determinants of Health   Financial Resource Strain: Not on file  Food Insecurity: Not on file  Transportation Needs: Not on file  Physical Activity: Not on file  Stress: Not on file  Social Connections: Not on file     Family History: The patient's family history includes Heart attack in his father.  ROS:   Please see the history of present illness.    All other systems reviewed and are negative.  EKGs/Labs/Other Studies Reviewed:    The following studies were reviewed today:  TAVR  OPERATIVE NOTE     Date of Procedure:                08/13/2021   Preoperative Diagnosis:      Severe Aortic Stenosis    Postoperative Diagnosis:    Same    Procedure:        Transcatheter Aortic Valve Replacement - Percutaneous Right Transfemoral Approach             Edwards Sapien 3 Ultra ResiliaTHV (size 23 mm, model # 9755RSL, serial # [id number])              Co-Surgeons:                        Gaye Pollack, MD and Sherren Mocha, MD   Anesthesiologist:                  Wilfrid Lund, MD   Echocardiographer:              Viona Gilmore. O'Neal, MD   Pre-operative Echo Findings: Severe aortic stenosis Normal left ventricular systolic function   Post-operative Echo Findings: Trace paravalvular leak Normal left ventricular systolic function   _____________     Echo 08/14/21: IMPRESSIONS   1. 23 mm S3. Vmax 2.3 m/s, MG 12.0 mmHG, EOA 2.36 cm2, DI 0.66. Trivial  paravalvular leak noted. The aortic valve has been repaired/replaced.  Aortic valve regurgitation is trivial. There is a 23 mm Sapien prosthetic  (TAVR) valve present in the aortic  position. Procedure Date: 08/13/2021. Echo findings are consistent with  normal structure and function of the aortic valve prosthesis.   2. Left ventricular ejection fraction, by estimation, is 60 to 65%. The  left ventricle has normal function. The left ventricle has no regional  wall motion abnormalities. There is moderate asymmetric left ventricular  hypertrophy of the basal-septal  segment. Left ventricular diastolic parameters are consistent with Grade I  diastolic dysfunction (impaired relaxation).   3. Right ventricular systolic function is normal. The right  ventricular  size is normal. Tricuspid regurgitation signal is inadequate for assessing  PA pressure.   4. The mitral valve is degenerative. Trivial mitral valve regurgitation.  Moderate to severe mitral annular calcification.   5. The inferior vena cava is normal in size with greater  than 50%  respiratory variability, suggesting right atrial pressure of 3 mmHg.   _________________________  Elwyn Reach AT 7- 08/2021 Study Highlights    Patch Wear Time:  14 days and 0 hours (2023-07-19T10:09:28-0400 to 2023-08-02T10:09:28-0400)   Patient had a min HR of 25 bpm, max HR of 91 bpm, and avg HR of 59 bpm. Predominant underlying rhythm was Sinus Rhythm. First Degree AV Block was present. Second Degree AV Block-Mobitz I (Wenckebach) was present. Isolated SVEs were frequent (8.4%,  97219), SVE Couplets were rare (<1.0%, 251), and no SVE Triplets were present. Isolated VEs were occasional (1.7%, 19665), VE Couplets were rare (<1.0%, 1445), and no VE Triplets were present.   The predominant rhythm was sinus rhythm at an average rate of 59 bpm with a range of 45 to 91 bpm.  There was very rare ectopy.  No episodes of atrial fibrillation or prolonged pauses were demonstrated.   _________________________   Echo 09/11/21 IMPRESSIONS  1. Left ventricular ejection fraction, by estimation, is 55 to 60%. The  left ventricle has normal function. The left ventricle has no regional  wall motion abnormalities. There is mild left ventricular hypertrophy.  Left ventricular diastolic parameters  are consistent with Grade I diastolic dysfunction (impaired relaxation).   2. Right ventricular systolic function is normal. The right ventricular  size is normal. There is mildly elevated pulmonary artery systolic  pressure. The estimated right ventricular systolic pressure is 93.2 mmHg.   3. Left atrial size was mild to moderately dilated.   4. The mitral valve is normal in structure. Mild mitral valve  regurgitation. No evidence of mitral stenosis. Moderate mitral annular  calcification.   5. Bioprosthetic aortic valve s/p TAVR. Edwards Sapien THV. Mean gradient  20 mmHg with DI 0.46, EOA 1.01 cm^2. Gradient is mildly elevated above  expected. Trivial peri-valvular leakage.   6. The inferior vena cava  is normal in size with greater than 50%  respiratory variability, suggesting right atrial pressure of 3 mmHg.   EKG:  EKG is NOT ordered today.    Recent Labs: 08/09/2021: ALT 14 08/14/2021: BUN 28; Creatinine, Ser 1.66; Hemoglobin 10.8; Magnesium 1.7; Platelets 119; Potassium 4.3; Sodium 136  Recent Lipid Panel    Component Value Date/Time   CHOL 145 01/18/2014 0803   CHOL 139 08/30/2012 0845   TRIG 70 01/18/2014 0803   TRIG 82 08/30/2012 0845   HDL 64 01/18/2014 0803   HDL 68 08/30/2012 0845   CHOLHDL 2.3 01/18/2014 0803   VLDL 14 01/18/2014 0803   LDLCALC 67 01/18/2014 0803   LDLCALC 55 08/30/2012 0845     Risk Assessment/Calculations:       Physical Exam:    VS:  BP (!) 136/52 (BP Location: Left Arm, Patient Position: Sitting, Cuff Size: Normal)   Pulse (!) 56   Ht _0  (1.676 m)   Wt 179 lb (81.2 kg)   BMI 28.89 kg/m     Wt Readings from Last 3 Encounters:  09/11/21 179 lb (81.2 kg)  08/21/21 176 lb (79.8 kg)  08/14/21 174 lb 11.2 oz (79.2 kg)     GEN:  Well nourished, well developed in no acute distress HEENT: Normal NECK: No JVD LYMPHATICS: No lymphadenopathy  CARDIAC: RRR, no murmurs, rubs, gallops RESPIRATORY:  Clear to auscultation without rales, wheezing or rhonchi  ABDOMEN: Soft, non-tender, non-distended MUSCULOSKELETAL:  No edema; No deformity  SKIN: Warm and dry NEUROLOGIC:  Alert and oriented x 3 PSYCHIATRIC:  Normal affect   ASSESSMENT:    1. S/P TAVR (transcatheter aortic valve replacement)   2. LBBB (left bundle branch block)   3. Primary hypertension   4. CAD in native artery   5. Stage 3b chronic kidney disease ([place])   6. Hyperlipidemia, mixed     PLAN:    In order of problems listed above:  Severe AS s/p TAVR: echo today shows EF 55%, normally functioning TAVR with a mean gradient of 20 mm hg and trivial PVL as well as mild to mod MR with moderate MAC.  His mean gradient is a little more elevated than previous (12 on POD1 echo),  but I do not think it's surprising given size 23 mm valve. Will get an echo in 6 months to follow trend. He is doing quite well with NYHA class I symptoms. SBE prophylaxis discussed; he says he can get abx from the dentist as his wife does this routinely. Continue Asprin and Plavix given recent PCI ( at least until 06/2022). I will see him back next year for follow up and echo.    LBBB: Zio AT with no HAVB   HTN: BP better controlled on increase Norvasc 76m daily. Continue Toprol XL 590mdaily. No ACE/ARB given advanced CKD.   CAD: s/p PCI of the ostial left main stenosis with a DES on 06/05/2021. Continue DAPT x 12 months. Continue high intensity statin.    CKD stage IIIb: creat ~1.8.  He follows with a nephrologist.    HLD: continue statin.    Medication Adjustments/Labs and Tests Ordered: Current medicines are reviewed at length with the patient today.  Concerns regarding medicines are outlined above.  No orders of the defined types were placed in this encounter.  No orders of the defined types were placed in this encounter.   Patient Instructions  Medication Instructions:  Your physician recommends that you continue on your current medications as directed. Please refer to the Current Medication list given to you today.  *If you need a refill on your cardiac medications before your next appointment, please call your pharmacy*   Lab Work: NONE If you have labs (blood work) drawn today and your tests are completely normal, you will receive your results only by: [place]Chart) OR A paper copy in the mail If you have any lab test that is abnormal or we need to change your treatment, we will call you to review the results.   Testing/Procedures: NONE   Follow-Up: [facility] are our [facility].  As part of our continuing mission to provide you with exceptional heart care, we have created designated Provider Care Teams.  These  Care Teams include your primary Cardiologist (physician) and Advanced Practice Providers (APPs -  Physician Assistants and Nurse Practitioners) who all work together to provide you with the care you need, when you need it.  We recommend signing up for the patient portal called "MyChart".  Sign up information is provided on this After Visit Summary.  MyChart is used to connect with patients for Virtual Visits (Telemedicine).  Patients are able to view lab/test results, encounter notes, upcoming appointments, etc.  Non-urgent messages can be sent to your provider as well.  To learn more about what you can do with MyChart, go to [email or web address].    Your next appointment:   KEEP SCHEDULE FOLLOW-UP  Important Information About Sugar         Weston Brass Angelena Form, PA-C  09/12/2021 4:54 AM    [place]

## 2021-09-12 ENCOUNTER — Other Ambulatory Visit: Payer: Self-pay | Admitting: Physician Assistant

## 2021-09-12 DIAGNOSIS — Z952 Presence of prosthetic heart valve: Secondary | ICD-10-CM

## 2021-09-23 ENCOUNTER — Encounter: Payer: Self-pay | Admitting: Cardiovascular Disease

## 2021-09-23 ENCOUNTER — Ambulatory Visit: Payer: PPO | Attending: Cardiovascular Disease | Admitting: Cardiovascular Disease

## 2021-09-23 DIAGNOSIS — I2510 Atherosclerotic heart disease of native coronary artery without angina pectoris: Secondary | ICD-10-CM | POA: Diagnosis not present

## 2021-09-23 DIAGNOSIS — I350 Nonrheumatic aortic (valve) stenosis: Secondary | ICD-10-CM | POA: Diagnosis not present

## 2021-09-23 DIAGNOSIS — I440 Atrioventricular block, first degree: Secondary | ICD-10-CM

## 2021-09-23 DIAGNOSIS — N1832 Chronic kidney disease, stage 3b: Secondary | ICD-10-CM

## 2021-09-23 DIAGNOSIS — E785 Hyperlipidemia, unspecified: Secondary | ICD-10-CM

## 2021-09-23 DIAGNOSIS — E782 Mixed hyperlipidemia: Secondary | ICD-10-CM

## 2021-09-23 DIAGNOSIS — E039 Hypothyroidism, unspecified: Secondary | ICD-10-CM

## 2021-09-23 DIAGNOSIS — Z952 Presence of prosthetic heart valve: Secondary | ICD-10-CM | POA: Diagnosis not present

## 2021-09-23 DIAGNOSIS — Z951 Presence of aortocoronary bypass graft: Secondary | ICD-10-CM | POA: Diagnosis not present

## 2021-09-23 DIAGNOSIS — I6523 Occlusion and stenosis of bilateral carotid arteries: Secondary | ICD-10-CM

## 2021-09-23 NOTE — Progress Notes (Signed)
Patient ID: Dillon [name], male   DOB: July 24, 1933, 88 y.o.   MRN: [id number]       HPI: Dillon [name], is a 88 y.o. male who presents to the office today for a 6 month follow-up cardiology evaluation.  In November 2009 Dillon [name] underwent emergent CABG revascularization surgery after cardiac catheterization revealed severe life-threatening anatomy with 95% ostial left main stenosis a 99% ostial RCA stenosis. Surgery was done by Dr. Cyndia Bent and he had a LIMA to the LAD, vein to the obtuse marginal, vein to the RCA. His last nuclear perfusion study in April 2012 continued to show normal perfusion.  Dillon [name] has documented carotid disease. A carotid Doppler study  in November 2013 which showed at least 60% stenosis in his carotid arteries bilaterally which was slightly increased from previously. A f/u carotid evaluation last year demonstrated his peak right internal carotid systolic velocity 409 slightly increased from 240 in his left PICA systolic velocity up to 11 slightly increased from 200; 50-69% diameter reduction range bilaterally and was not significantly changed from one year ago.  On 12/01/2013 a follow-up study demonstrated a peak PICA velocity was now 811 with diastolic velocity at 42 and the right carotid and 218 and 61 in the left carotid.  He remains asymptomatic and these place him in the upper end of scale in the 50-69% range  Additional problems include mixed hyperlipidemia and hypertension.  He has been on Toprol-XL 50 mg and losartan 100 mg in addition to amlodipine 5 mg for blood pressure control.. In the past he had derived marked benefit with Niaspan  as well as [name]. Follow-up laboratory on his current dose of [name] and niacin 1000 mg  revealed a total cholesterol 145, triglycerides 70, HDL 64, and LDL 67.  His glucose was 111.  TSH 4.5.  He had normal renal function with a BUN of 19 and creatinine of 1.1.  He underwent echo Doppler study on 06/06/2014.  This showed  an ejection fraction at 55-60%.  There was a small systolic gradient across his aortic valve with moderately calcified leaflets.  Valve area was 1.6 cm.  He had a mean gradient of 11 and a peak gradient of 23 mm suggestive of mild aortic stenosis.  PA pressure was 31 mm.  A nuclear perfusion study which remained normal with an ejection fraction of 59% and evidence for normal perfusion.  He underwent a F/U 2-D echo Doppler study on 08/20/2015 which showed an EF of 60-65%.  The aortic valve was calcified and thickened with mildly restricted motion.  Peak and mean gradients were 25 and 14 mm consistent with mild aortic stenosis with a valve area of 1.42 cm.  I scheduled him for follow-up carotid duplex exam which was done in July 2017.  This suggested progression of his right internal carotid stenoses with velocity now at 465/131, which places him in the greater than 80% range.  He had stable left internal carotid velocities now or in the 60-79% range.  He had normal subclavian arteries bilaterally, and patent vertebral arteries with antegrade flow.  I referred him to Dr. [name] who felt that with his asymptomatic status and low risk assessment that he should undergo carotid endarterectomy.  He was referred to Dr. [name] for an office evaluation but this has not yet been scheduled.  He was hospitalized in December 2017 with community-acquired pneumonia of the left lower lung and possible empyema.  He underwent thoracentesis as well as a  VATS procedure with decortication and left empyema drainage.  His carotid surgery with Dr. [name] was ultimately postponed but was successfully done on 03/26/2016 with right carotid endarterectomy.  Intraoperative findings included a 95% stenosis.  He was discharged the following day.    I saw him in October 2018 at which time he was doing well without chest pain, PND, orthopnea. He denies paresthesias, presyncope or syncope.  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  He had his fifth hole in one of his career on a par 3.  He recently had blood work done in [place] by his primary physician.  Renal function was stable, although potassium was upper normal at 5.2.  Lipid studies revealed cholesterol 148, triglycerides 134, HDL 52, LDL 69.  Thyroid function studies were normal.  Hemoglobin A1c was elevated at 6.4 and his estimated average glucose was 137.  He underwent a follow-up echo Doppler study in 11/04/2016.  This continued to show hyperdynamic LV function with an EF of 65-70% with moderate LVH.  Wall motion was normal.  His aortic valve gradient have slightly increased over the year, such that his peak gradient increased from 25 to 38 mm, and his mean gradient from 14 mm to 20 mmHg. Aortic valve area was 1.57 cm placing him still in the mild to mild to moderate AS category.    He had follow-up carotid imaging with Dr. [name] which showed a patent carotid endarterectomy site with no evidence for restenosis or hyperplasia.  Left internal carotid artery velocities suggested a 40 to 59% stenosis.  When I last saw him in September 2019 he denied any chest pain, presyncope or syncope or any symptoms of heart failure.  I reviewed recent laboratory done by his primary physician on March 04, 2017.  Chemistry was stable.  Total cholesterol was 125, triglycerides 73, HDL 49, and LDL 61.  TSH is 1.77.  Free T3 was 3.07 and free T4 1.0.  He was not anemic.  HbA1c 6.2  Dillon [name] underwent an evaluation by Dillon Sims, NP in December 2019 after developing some mild irregularity to his heart rate was noted by his primary physician.  He was felt to have occasional to frequent PACs and had bigeminal PACs.  This ultimately resolved on its own.  On April 01, 2018 he underwent a follow-up echo Doppler study which continued to show normal LV function with an EF of 55 to 60%.  There was mitral annular calcification.  His aortic stenosis was in the moderate range with a  mean gradient now at 25 and a peak instantantaneous gradient at 41 with a valve area of 1.3 cm.  When that evaluation he remained asymptomatic and specifically denied chest pain, PND, orthopnea, presyncope or syncope.   I last saw him in September 2020 at which time he remained asymptomatic.  His wife had recently under gone knee surgery in August and they have been staying local instead of going down to [place].  He continues to deny any episodes of chest pain, change in exercise tolerance, palpitations, presyncope or syncope, or CHF symptomatology.  He is unaware of any palpitations.    Mr. Kramp underwent a 1 year follow-up echo Doppler study on April 18, 2019.  LV function remains normal with EF 60 to 65%.  There was grade 2 diastolic dysfunction and mild LVH.  He had moderately elevated pulmonary artery systolic pressure at 44 mm.  There was moderate aortic stenosis with a mean gradient of  24 and peak instantaneous gradient at 39 mmHg.  Aortic valve area was 1.04 cm.  There was mild aortic insufficiency.  When I saw him in April 2021 he remained asymptomatic and denied any chest pain or shortness of breath.  He denied any exertional dyspnea.   He had laboratory checked in February 2021 which showed a total cholesterol 138 HDL 57 LDL 62 and triglycerides 94.  During that evaluation I reviewed his echo Doppler with him in detail and his gradients had not significantly changed from prior evaluation.  I saw him in November 2021.  Since his prior evaluation he continued to do well. 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  He played golf 6 times without chest pain or shortness of breath.  He states his heart rate typically runs in the 50s.  He denies any chest pain, dizziness or shortness of breath.  During that evaluation his blood pressure was mildly elevated and he was on amlodipine 5 mg, losartan 100 mg in addition to metoprolol succinate 50 mg.  He was mildly  bradycardic without symptoms.  In April 2022 I recommended follow-up echo Doppler study to reassess his aortic valve stenosis.  He was continuing DAPT therapy with aspirin/Plavix and was on levothyroxine for hypothyroidism.  He underwent follow-up carotid duplex imaging and was seen at [facility] by Laurence Slate, PA-C.  He is status post right carotid endarterectomy in February 2015.  Carotid studies were reviewed which showed mild 1 to 39% stenoses bilaterally.  He had normal vertebral and subclavian flow.  He underwent an echo Doppler study on May 10, 2020.  This continued to show normal EF of 55 to 60%.  There was mild LVH.  There were no wall motion abnormalities.  He had severe mitral annular calcification without mitral stenosis.  His mean aortic valve gradient had increased to 40.4 mmHg with a peak gradient at 70.4.  Aortic valve area was 1.02 cm.  There was mild dilation of his ascending aorta at 36 mm..  I saw him May 2022.  At that time he remained completely asymptomatic. He was continuing to play golf regularly and denies any change in exercise capacity while playing golf.  However, if he attempts to walk very fast he does note some very mild shortness of breath.  He is unaware of palpitations.  His blood pressure has been stable.  He had undergone follow-up lipid studies on April 26, 2020 which showed an LDL at 59 total cholesterol 131 and triglycerides at 88 with HDL at 54.  His most recent creatinine had increased to 1.67.  During that evaluation I had an extensive discussion with him concerning his April 2022 echo Doppler findings regarding his aortic stenosis and I spent considerable time reviewing the natural history of aortic stenosis and potential progression.  I last saw him on September 24, 2020 and since his prior evaluation he continued to be asymptomatic.  He continues to be symptom-free.  I had recommended he undergo a follow-up echo Doppler study which was done on September 06, 2020.  Ejection  fraction continues to be excellent at 60 to 65%.  There is mild LVH and mild grade 1 diastolic dysfunction with elevated left atrial pressure.  His mean aortic gradient has increased from 40 to 47 mm and his peak instantaneous gradient from 70 to 77 mm.  Estimated aortic valve area is 0.98 cm2.  During that evaluation, with his progressive aortic stenosis I recommended an initial  evaluation with Dr. Cyndia Bent since he had performed his CABG revascularization surgery in 2009 for consideration of potential future candidacy for TAVR.  He apparently was seen by Dr. Burt Knack on October 29, 2020.  He remained functionally independent who was playing golf several times per week, performing yard work and chores around his house without symptoms.  He was felt to have severe stage C aortic stenosis and after lengthy discussion with Dr. Burt Knack concerning likely future progression of his aortic stenosis he was counseled extensively to watch for symptoms of progressive fatigue, dyspnea, chest discomfort, lightheadedness or syncope.  He underwent a follow-up echo Doppler study on March 19, 2021 which continues to show normal LV function with mild LVH with EF 60 to 65%.  He again was found to have severe aortic stenosis and there was slight progression of his mean gradient 47 up to 53 mmHg with a peak gradient of 83.2 mmHg.  There was moderate mitral annular calcification.  Mr. Bordelon presents today stating he feels well.  He specifically denies any development of symptoms.  He had recently been evaluated at [facility] in follow-up of his carotid artery disease.  He is status post right CEA by Dr. Velta Addison in February 2018 and he remained asymptomatic with slight increase of the left ICA now in the 40 to 59% range with right ICA at 1 to 39%.  He denies chest pain PND orthopnea, presyncope or syncope or palpitations.  He presents for reevaluation.  Past Medical History:  Diagnosis Date   CAD (coronary artery disease)    History of  hiatal hernia    Hypertension    Hypothyroidism    Pneumonia 12/2015   hx   S/P CABG x 3 12/17/2007   LIMA to LAD,SVG to left C   S/P TAVR (transcatheter aortic valve replacement) 08/13/2021   s/p TAVR with a 23 mm Edwards S3UR via the TF approach by Dr. Burt Knack & Dr. Cyndia Bent   Severe aortic stenosis    s/p TAVR    Past Surgical History:  Procedure Laterality Date   CORONARY ARTERY BYPASS GRAFT  12/17/07   LIMA to LAD,vein to obtuse marginal,vein to RCA   CORONARY STENT INTERVENTION N/A 06/05/2021   Procedure: CORONARY STENT INTERVENTION;  Surgeon: Sherren Mocha, MD;  Location: [facility];  Service: Cardiovascular;  Laterality: N/A;   EMPYEMA DRAINAGE Left 01/11/2016   Procedure: EMPYEMA DRAINAGE;  Surgeon: Ivin Poot, MD;  Location: [facility];  Service: Thoracic;  Laterality: Left;   ENDARTERECTOMY Right 03/26/2016   Procedure: RIGHT CAROTID ENDARTERECTOMY;  Surgeon: Serafina Mitchell, MD;  Location: [facility];  Service: Vascular;  Laterality: Right;   INTRAOPERATIVE TRANSTHORACIC ECHOCARDIOGRAM N/A 08/13/2021   Procedure: INTRAOPERATIVE TRANSTHORACIC ECHOCARDIOGRAM;  Surgeon: Sherren Mocha, MD;  Location: [facility];  Service: Open Heart Surgery;  Laterality: N/A;   PATCH ANGIOPLASTY Right 03/26/2016   Procedure: PATCH ANGIOPLASTY USING Rueben Bash BIOLOGIC PATCH;  Surgeon: Serafina Mitchell, MD;  Location: [facility];  Service: Vascular;  Laterality: Right;   RIGHT HEART CATH AND CORONARY/GRAFT ANGIOGRAPHY N/A 05/14/2021   Procedure: RIGHT HEART CATH AND CORONARY/GRAFT ANGIOGRAPHY;  Surgeon: Troy Sine, MD;  Location: [facility];  Service: Cardiovascular;  Laterality: N/A;   TRANSCATHETER AORTIC VALVE REPLACEMENT, TRANSFEMORAL N/A 08/13/2021   Procedure: Transcatheter Aortic Valve Replacement, Transfemoral;  Surgeon: Sherren Mocha, MD;  Location: [facility];  Service: Open Heart Surgery;  Laterality: N/A;   ULTRASOUND GUIDANCE FOR VASCULAR ACCESS Bilateral 08/13/2021   Procedure:  ULTRASOUND GUIDANCE FOR VASCULAR ACCESS, BILATERAL FEMORAL ARTERIES AND LEFT FEMORAL VEIN;  Surgeon: Sherren Mocha, MD;  Location: [facility];  Service: Open Heart Surgery;  Laterality: Bilateral;   VIDEO ASSISTED THORACOSCOPY (VATS)/DECORTICATION Left 01/11/2016   Procedure: VIDEO ASSISTED THORACOSCOPY (VATS)/DECORTICATION;  Surgeon: Ivin Poot, MD;  Location: [facility];  Service: Thoracic;  Laterality: Left;    No Known Allergies  Current Outpatient Medications  Medication Sig Dispense Refill   amLODipine (NORVASC) 10 MG tablet Take 1 tablet (10 mg total) by mouth daily. 180 tablet 3   aspirin EC 81 MG tablet Take 81 mg by mouth at bedtime.      Cholecalciferol (VITAMIN D) 2000 units tablet Take 2,000 Units by mouth at bedtime.     clopidogrel (PLAVIX) 75 MG tablet Take 1 tablet by mouth once daily 90 tablet 0   Cyanocobalamin (B-12) 2000 MCG TABS Take 2,000 mcg by mouth daily.     fexofenadine (ALLEGRA) 180 MG tablet Take 180 mg by mouth daily.     folic acid (FOLVITE) 1 MG tablet Take 1 tablet (1 mg total) by mouth daily. 90 tablet 3   levothyroxine (SYNTHROID, LEVOTHROID) 112 MCG tablet Take 112 mcg by mouth daily before breakfast.     metoprolol succinate (TOPROL-XL) 50 MG 24 hr tablet Take 1 tablet (50 mg total) by mouth daily. Take with or immediately following a meal. 90 tablet 3   mupirocin cream (BACTROBAN) 2 % Apply 1 application topically daily as needed (after skin treatments).      Omega-3 1400 MG CAPS Take 1,400 mg by mouth daily.     rosuvastatin (CRESTOR) 40 MG tablet Take 1 tablet by mouth every day 90 tablet 2   terbinafine (LAMISIL) 1 % cream Apply 1 application topically daily as needed (rash).      No current facility-administered medications for this visit.    Socially he is married. There are no children. He  remains active and plays golf, yard work, and household chores.  He does walk. There is no tobacco use. He does drink occasional alcohol.  ROS General:  Negative; No fevers, chills, or night sweats;  HEENT: Negative; No changes in vision or hearing, sinus congestion, difficulty swallowing Pulmonary: Negative; No cough, wheezing, shortness of breath, hemoptysis Cardiovascular:  See HPI GI: Negative; No nausea, vomiting, diarrhea, or abdominal pain GU: Negative; No dysuria, hematuria, or difficulty voiding Musculoskeletal: Negative; no myalgias, joint pain, or weakness Hematologic/Oncology: Negative; no easy bruising, bleeding Endocrine: Positive for hypothyroidism on Synthroid replacement. Neuro: Negative; no changes in balance, headaches Skin: Negative; No rashes or skin lesions Psychiatric: Negative; No behavioral problems, depression Sleep: Negative; No snoring, daytime sleepiness, hypersomnolence, bruxism, restless legs, hypnogognic hallucinations, no cataplexy Other comprehensive 14 point system review is negative.   PE BP (!) 142/60   Pulse (!) 53   Ht $R'5\' 6"'rb$  (1.676 m)   Wt 178 lb 12.8 oz (81.1 kg)   SpO2 97%   BMI 28.86 kg/m    Repeat blood pressure was 145/70.  Wt Readings from Last 3 Encounters:  09/23/21 178 lb 12.8 oz (81.1 kg)  09/11/21 179 lb (81.2 kg)  08/21/21 176 lb (79.8 kg)      Physical Exam BP (!) 142/60   Pulse (!) 53   Ht $R'5\' 6"'VG$  (1.676 m)   Wt 178 lb 12.8 oz (81.1 kg)   SpO2 97%   BMI 28.86 kg/m  General: Alert, oriented, no distress.  Skin: normal turgor, no rashes, warm and dry  HEENT: Normocephalic, atraumatic. Pupils equal round and reactive to light; sclera anicteric; extraocular muscles intact; Fundi ** Nose without nasal septal hypertrophy Mouth/Parynx benign; Mallinpatti scale Neck: No JVD, no carotid bruits; normal carotid upstroke Lungs: clear to ausculatation and percussion; no wheezing or rales Chest wall: without tenderness to palpitation Heart: PMI not displaced, RRR, s1 s2 normal, 1/6 systolic murmur, no diastolic murmur, no rubs, gallops, thrills, or heaves Abdomen: soft,  nontender; no hepatosplenomehaly, BS+; abdominal aorta nontender and not dilated by palpation. Back: no CVA tenderness Pulses 2+ Musculoskeletal: full range of motion, normal strength, no joint deformities Extremities: no clubbing cyanosis or edema, Homan's sign negative  Neurologic: grossly nonfocal; Cranial nerves grossly wnl Psychologic: Normal mood and affect    General: Alert, oriented, no distress.  Skin: normal turgor, no rashes, warm and dry HEENT: Normocephalic, atraumatic. Pupils equal round and reactive to light; sclera anicteric; extraocular muscles intact;  Nose without nasal septal hypertrophy Mouth/Parynx benign; Mallinpatti scale 3 Neck: No JVD, no carotid bruits; normal carotid upstroke Lungs: clear to ausculatation and percussion; no wheezing or rales Chest wall: without tenderness to palpitation Heart: PMI not displaced, RRR, s1 s2 normal, 2/6 harsh mid-to-late peaking systolic murmur, no diastolic murmur, no rubs, gallops, thrills, or heaves Abdomen: soft, nontender; no hepatosplenomehaly, BS+; abdominal aorta nontender and not dilated by palpation. Back: no CVA tenderness Pulses 2+ Musculoskeletal: full range of motion, normal strength, no joint deformities Extremities: no clubbing cyanosis or edema, Homan's sign negative  Neurologic: grossly nonfocal; Cranial nerves grossly wnl Psychologic: Normal mood and affect  September 23, 2021 ECG (independently read by me): Sinus bradycardia at 53, 1st degree AV block   April 08, 2021 ECG (independently read by me):  Sinus bradycardia at 47, 1st degree AV block; 1st degree AV block, PR 222 msec  September 24, 2020 ECG (independently read by me): Sinus bradycardia at 51, 1 st degree AV block, PR 220 msec; QTc 401 msec  May 29, 2020 ECG (independently read by me): Sinus bradycardia 51 bpm, PACs, first-degree AV block with a PR 212 ms.  There is no evidence for LV strain or LVH.  November 2021 ECG (independently read by me): Sinus  bradycardia at 48 bpm, first-degree AV block, PR 216 ms.  No ectopy.  No ECG changes.  QTc interval normal at 398 ms  April14, 2021 ECG (independently read by me): Sinus bradycardia at 54 bpm with mild sinus arrhythmia.  First-degree AV block with a PR interval at 214 ms.  No ectopy.  September 2020 ECG (independently read by me): Sinus bradycardia 54 bpm, isolated PAC, RV conduction delay/incomplete right bundle branch block.  PR interval 202 ms.  March 2020 ECG (independently read by me): Sinus rhythm at 60 bpm with mild sinus arrhythmia.  Normal intervals  September 2019 ECG (independently read by me): Sinus bradycardia at 51 bpm.  Mild RV conduction delay.  No significant ST changes.  PR interval 200 ms.  October 2018 ECG (independently read by me): Normal sinus rhythm with PACs.  PR interval 200 ms, QTc interval 412 ms.  April 2018 ECG (independently read by me): Normal sinus rhythm with sinus arrhythmia with an average heart rate at 60 bpm.  Normal intervals.  Small inferior Q waves.  February 2017 ECG (independently read by me): Sinus bradycardia with mild sinus arrhythmia at 58 bpm.  Mild RV conduction delay.  No significant ST segment changes.  July 2016 ECG (independently read by me): Sinus bradycardia with mild sinus arrhythmia, heart  rate ranging from 48-58.  December 2014 ECG: Normal sinus rhythm at 54 beats per minute; normal intervals.  LABS: I personally reviewed the blood work  from [facility] done on 07/19/2015.   Total cholesterol 142, triglycerides 120, HDL 61, LDL 57.  I extensively reviewed laboratory from [facility] primary care from 08/29/2016  I personally reviewed the laboratory as noted above from February 2019 Total cholesterol 125, triglycerides 73, HDL 49, LDL 61.  Personally reviewed lab work from March 22, 2019.     Latest Ref Rng & Units 08/14/2021    2:39 AM 08/13/2021   10:30 AM 08/13/2021    8:00 AM  BMP  Glucose 70 - 99 mg/dL 117  128    BUN 8 - 23 mg/dL  28  24    Creatinine 0.61 - 1.24 mg/dL 1.66  1.60    Sodium 135 - 145 mmol/L 136  141  140   Potassium 3.5 - 5.1 mmol/L 4.3  4.6  4.4   Chloride 98 - 111 mmol/L 106  107    CO2 22 - 32 mmol/L 22     Calcium 8.9 - 10.3 mg/dL 8.8         Latest Ref Rng & Units 08/09/2021    8:27 AM 05/03/2021    8:34 PM 03/18/2016   10:50 AM  Hepatic Function  Total Protein 6.5 - 8.1 g/dL 6.9  7.9  7.7   Albumin 3.5 - 5.0 g/dL 3.6  4.6  4.2   AST 15 - 41 U/L $Remo'17  18  22   'qBAnt$ ALT 0 - 44 U/L $Remo'14  11  16   'YuWXG$ Alk Phosphatase 38 - 126 U/L 63  61  70   Total Bilirubin 0.3 - 1.2 mg/dL 0.5  0.4  0.6       Latest Ref Rng & Units 08/14/2021    2:39 AM 08/13/2021   10:30 AM 08/13/2021    8:00 AM  CBC  WBC 4.0 - 10.5 K/uL 7.7     Hemoglobin 13.0 - 17.0 g/dL 10.8  10.2  11.6   Hematocrit 39.0 - 52.0 % 33.1  30.0  34.0   Platelets 150 - 400 K/uL 119      Lab Results  Component Value Date   MCV 99.1 08/14/2021   MCV 100.8 (H) 08/09/2021   MCV 100.6 (H) 06/06/2021   Lab Results  Component Value Date   TSH 4.322 01/10/2016   Lab Results  Component Value Date   HGBA1C 7.2 (H) 01/10/2016     Lipid Panel     Component Value Date/Time   CHOL 145 01/18/2014 0803   CHOL 139 08/30/2012 0845   TRIG 70 01/18/2014 0803   TRIG 82 08/30/2012 0845   HDL 64 01/18/2014 0803   HDL 68 08/30/2012 0845   CHOLHDL 2.3 01/18/2014 0803   VLDL 14 01/18/2014 0803   LDLCALC 67 01/18/2014 0803   LDLCALC 55 08/30/2012 0845    ECHO:09/06/2020  1. Severe AS with mean gradient 47 mmg; DI 0.26; mild AI.   2. Left ventricular ejection fraction, by estimation, is 60 to 65%. The  left ventricle has normal function. The left ventricle has no regional  wall motion abnormalities. There is mild left ventricular hypertrophy.  Left ventricular diastolic parameters  are consistent with Grade I diastolic dysfunction (impaired relaxation).  Elevated left atrial pressure.   3. Right ventricular systolic function is normal. The right  ventricular  size is normal. There is normal pulmonary artery systolic  pressure.   4. Left atrial size was moderately dilated.   5. Right atrial size was moderately dilated.   6. The mitral valve is normal in structure. Trivial mitral valve  regurgitation. No evidence of mitral stenosis. Moderate mitral annular  calcification.   7. The aortic valve is tricuspid. Aortic valve regurgitation is mild.  Severe aortic valve stenosis.   8. The inferior vena cava is normal in size with greater than 50%  respiratory variability, suggesting right atrial pressure of 3 mmHg.   Comparison(s): No significant change from prior study.   FINDINGS   Left Ventricle: Left ventricular ejection fraction, by estimation, is 60  to 65%. The left ventricle has normal function. The left ventricle has no  regional wall motion abnormalities. The left ventricular internal cavity  size was normal in size. There is   mild left ventricular hypertrophy. Left ventricular diastolic parameters  are consistent with Grade I diastolic dysfunction (impaired relaxation).  Elevated left atrial pressure.   Right Ventricle: The right ventricular size is normal. Right ventricular  systolic function is normal. There is normal pulmonary artery systolic  pressure. The tricuspid regurgitant velocity is 2.67 m/s, and with an  assumed right atrial pressure of 3 mmHg,   the estimated right ventricular systolic pressure is 17.0 mmHg.   Left Atrium: Left atrial size was moderately dilated.   Right Atrium: Right atrial size was moderately dilated.   Pericardium: There is no evidence of pericardial effusion.   Mitral Valve: The mitral valve is normal in structure. Moderate mitral  annular calcification. Trivial mitral valve regurgitation. No evidence of  mitral valve stenosis.   Tricuspid Valve: The tricuspid valve is normal in structure. Tricuspid  valve regurgitation is mild . No evidence of tricuspid stenosis.   Aortic Valve:  The aortic valve is tricuspid. Aortic valve regurgitation is  mild. Aortic regurgitation PHT measures 533 msec. Severe aortic stenosis  is present. Aortic valve mean gradient measures 47.5 mmHg. Aortic valve  peak gradient measures 77.1 mmHg.  Aortic valve area, by VTI measures 0.98 cm.   Pulmonic Valve: The pulmonic valve was not well visualized. Pulmonic valve  regurgitation is not visualized. No evidence of pulmonic stenosis.   Aorta: The aortic root is normal in size and structure.   Venous: The inferior vena cava is normal in size with greater than 50%  respiratory variability, suggesting right atrial pressure of 3 mmHg.   IAS/Shunts: No atrial level shunt detected by color flow Doppler.   Additional Comments: Severe AS with mean gradient 47 mmg; DI 0.26; mild  AI.      LEFT VENTRICLE  PLAX 2D  LVIDd:         4.70 cm  Diastology  LVIDs:         3.40 cm  LV e' medial:    4.14 cm/s  LV PW:         1.20 cm  LV E/e' medial:  24.0  LV IVS:        1.30 cm  LV e' lateral:   6.32 cm/s  LVOT diam:     2.20 cm  LV E/e' lateral: 15.7  LV SV:         114  LV SV Index:   61  LVOT Area:     3.80 cm                             3D Volume EF:  3D EF:        66 %                          LV EDV:       119 ml                          LV ESV:       41 ml                          LV SV:        79 ml   RIGHT VENTRICLE  RV S prime:     10.30 cm/s  TAPSE (M-mode): 1.4 cm   LEFT ATRIUM             Index       RIGHT ATRIUM           Index  LA diam:        3.40 cm 1.82 cm/m  RA Area:     23.40 cm  LA Vol (A2C):   56.8 ml 30.41 ml/m RA Volume:   77.70 ml  41.61 ml/m  LA Vol (A4C):   51.9 ml 27.79 ml/m  LA Biplane Vol: 57.5 ml 30.79 ml/m   AORTIC VALVE  AV Area (Vmax):    1.03 cm  AV Area (Vmean):   0.94 cm  AV Area (VTI):     0.98 cm  AV Vmax:           439.00 cm/s  AV Vmean:          332.500 cm/s  AV VTI:            1.155 m  AV Peak Grad:      77.1  mmHg  AV Mean Grad:      47.5 mmHg  LVOT Vmax:         119.00 cm/s  LVOT Vmean:        82.000 cm/s  LVOT VTI:          0.299 m  LVOT/AV VTI ratio: 0.26  AI PHT:            533 msec     AORTA  Ao Root diam: 3.10 cm  Ao Asc diam:  3.70 cm   MITRAL VALVE                TRICUSPID VALVE  MV Area (PHT): 2.37 cm     TR Peak grad:   28.5 mmHg  MV Decel Time: 320 msec     TR Vmax:        267.00 cm/s  MV E velocity: 99.20 cm/s  MV A velocity: 122.00 cm/s  SHUNTS  MV E/A ratio:  0.81         Systemic VTI:  0.30 m                              Systemic Diam: 2.20 cm     ECHO: 03/19/2021  1. Left ventricular ejection fraction, by estimation, is 60 to 65%. The  left ventricle has normal function. The left ventricle has no regional  wall motion abnormalities. There is mild left ventricular hypertrophy.  Left ventricular diastolic parameters  were normal.   2. Right ventricular systolic function is normal. The right ventricular  size is normal. There  is normal pulmonary artery systolic pressure.   3. Left atrial size was moderately dilated.   4. The mitral valve is degenerative. Trivial mitral valve regurgitation.  No evidence of mitral stenosis. Moderate mitral annular calcification.   5. Gradients have increased and AVA decreased since echo done 8.11/22.  The aortic valve is tricuspid. There is severe calcifcation of the aortic  valve. There is severe thickening of the aortic valve. Aortic valve  regurgitation is mild. Severe aortic  valve stenosis.   6. The inferior vena cava is normal in size with greater than 50%  respiratory variability, suggesting right atrial pressure of 3 mmHg.   FINDINGS   Left Ventricle: Left ventricular ejection fraction, by estimation, is 60  to 65%. The left ventricle has normal function. The left ventricle has no  regional wall motion abnormalities. The left ventricular internal cavity  size was normal in size. There is   mild left ventricular hypertrophy.  Left ventricular diastolic parameters  were normal.   Right Ventricle: The right ventricular size is normal. No increase in  right ventricular wall thickness. Right ventricular systolic function is  normal. There is normal pulmonary artery systolic pressure. The tricuspid  regurgitant velocity is 2.78 m/s, and   with an assumed right atrial pressure of 3 mmHg, the estimated right  ventricular systolic pressure is 52.7 mmHg.   Left Atrium: Left atrial size was moderately dilated.   Right Atrium: Right atrial size was normal in size.   Pericardium: There is no evidence of pericardial effusion.   Mitral Valve: The mitral valve is degenerative in appearance. There is  moderate thickening of the mitral valve leaflet(s). There is moderate  calcification of the mitral valve leaflet(s). Moderate mitral annular  calcification. Trivial mitral valve  regurgitation. No evidence of mitral valve stenosis.   Tricuspid Valve: The tricuspid valve is normal in structure. Tricuspid  valve regurgitation is mild . No evidence of tricuspid stenosis.   Aortic Valve: Gradients have increased and AVA decreased since echo done  8.11/22. The aortic valve is tricuspid. There is severe calcifcation of  the aortic valve. There is severe thickening of the aortic valve. Aortic  valve regurgitation is mild. Aortic  regurgitation PHT measures 504 msec. Severe aortic stenosis is present.  Aortic valve mean gradient measures 53.0 mmHg. Aortic valve peak gradient  measures 83.2 mmHg. Aortic valve area, by VTI measures 0.80 cm.   Pulmonic Valve: The pulmonic valve was normal in structure. Pulmonic valve  regurgitation is not visualized. No evidence of pulmonic stenosis.   Aorta: The aortic root is normal in size and structure.   Venous: The inferior vena cava is normal in size with greater than 50%  respiratory variability, suggesting right atrial pressure of 3 mmHg.   IAS/Shunts: No atrial level shunt detected  by color flow Doppler.      LEFT VENTRICLE  PLAX 2D  LVIDd:         4.50 cm   Diastology  LVIDs:         2.60 cm   LV e' medial:    5.00 cm/s  LV PW:         0.90 cm   LV E/e' medial:  22.4  LV IVS:        1.30 cm   LV e' lateral:   9.03 cm/s  LVOT diam:     2.00 cm   LV E/e' lateral: 12.4  LV SV:         103  LV SV Index:   55        2D Longitudinal Strain  LVOT Area:     3.14 cm  2D Strain GLS (A2C):   -16.6 %                           2D Strain GLS (A3C):   -18.8 %                           2D Strain GLS (A4C):   -18.1 %                           2D Strain GLS Avg:     -17.8 %   RIGHT VENTRICLE  RV Basal diam:  4.15 cm  RV Mid diam:    3.30 cm  RV S prime:     10.40 cm/s  TAPSE (M-mode): 2.6 cm  RVSP:           33.9 mmHg   LEFT ATRIUM             Index        RIGHT ATRIUM           Index  LA diam:        4.40 cm 2.33 cm/m   RA Pressure: 3.00 mmHg  LA Vol (A2C):   67.3 ml 35.64 ml/m  RA Area:     16.90 cm  LA Vol (A4C):   69.2 ml 36.64 ml/m  RA Volume:   43.10 ml  22.82 ml/m  LA Biplane Vol: 66.7 ml 35.32 ml/m   AORTIC VALVE  AV Area (Vmax):    0.75 cm  AV Area (Vmean):   0.71 cm  AV Area (VTI):     0.80 cm  AV Vmax:           456.00 cm/s  AV Vmean:          352.000 cm/s  AV VTI:            1.290 m  AV Peak Grad:      83.2 mmHg  AV Mean Grad:      53.0 mmHg  LVOT Vmax:         109.00 cm/s  LVOT Vmean:        79.600 cm/s  LVOT VTI:          0.329 m  LVOT/AV VTI ratio: 0.26  AI PHT:            504 msec     AORTA  Ao Root diam: 3.10 cm  Ao Asc diam:  3.50 cm   MITRAL VALVE                TRICUSPID VALVE  MV Area (PHT):              TR Peak grad:   30.9 mmHg  MV Decel Time:              TR Vmax:        278.00 cm/s  MV E velocity: 112.00 cm/s  Estimated RAP:  3.00 mmHg  MV A velocity: 101.00 cm/s  RVSP:           33.9 mmHg  MV E/A ratio:  1.11  SHUNTS                              Systemic VTI:  0.33 m                               Systemic Diam: 2.00 cm   IMPRESSION:  No diagnosis found.   ASSESSMENT AND PLAN DillonZinn is an 88 year old gentleman who underwent emergent CABG revascularization surgery for life-threatening coronary anatomy in November 2009 by Dr.Bartle.  He has a history of peripheral vascular disease and underwent successful right carotid endarterectomy by Dr. [name]. Carotid studies from February 2022 and most recently February 2023 shows stable velocities in the right carotid with slight increase in the left carotid now in the 40 to 59% range.  I have been following closely with reference to his aortic stenosis which has recently shown consistent slow progression and is severe.  He underwent structural heart team evaluation with Dr. Burt Knack on October 29, 2020 after his mean gradient had increased to 47 mmHg.  At that time he was felt to have severe, stage C aortic stenosis but remained entirely asymptomatic.  A subsequent echo Doppler study from February 2020 again shows slight progression with his peak instantaneous gradient now increasing to 83 mm and his mean gradient 53 mmHg.  Mr. Cremer continues to remain asymptomatic and specifically continues to be active, plays golf, walks and does yard work.  I again discussed with him the natural history of aortic stenosis and potential future progression leading to symptomatic development, LV dysfunction, and potential risk for increased sudden death.  Presently he prefers to continue to have close follow-up evaluation.  I have recommended a follow-up echo Doppler study in 5 months with reevaluation with me in August 2023.  He states his blood pressure at home typically runs around 294 systolic.  He continues to be on losartan 50 mg, metoprolol succinate 50 mg and amlodipine 5 mg daily.  He is on DAPT with his CAD status post CABG and carotid disease.  He is on levothyroxine for hypothyroidism at 112 mcg.  He continues to be on rosuvastatin 40 mg.  He is followed by Dr.  Leodis Binet of nephrology in [facility] with most recent creatinine 1.64.  I will see him in August 2023 for reevaluation.  were reviewed with prior studies and continue to show stable velocities in the 1 to 39% range bilaterally with normal vertebral and subclavian flow.  Mr. Lamson continues to feel well and denies any chest pain palpitations or significant change in activity.  He continues to play golf where he walks up and down hills without dyspnea.  However if he does overextend himself as he has noticed some very minimal shortness of breath compared to previously.  He has a history of aortic valve stenosis and over the years his aortic valve gradient has progressed and his aortic valve stenosis is now in the serious range.  His echo Doppler study from April 2022 showed a mean 40 mm with a peak gradient of 70 and aortic valve area of approximately 1 cm.  During his May 29, 2020 I discussed the progression of his aortic stenosis with him in detail and discussed the potential need for aortic valve surgery.  At that time he insisted that he was asymptomatic and felt well.  I recommended a follow-up echo Doppler study be obtained and this  was recently completed on September 06, 2020.  This shows slight additional progression with a mean gradient now at 47 and peak gradient at 77 with aortic valve area 0.98 cm.  He continues to play golf and will be playing in a tournament in September.  He does not walk but uses a golf cart 2 days/week.  He also remains active doing yard work.  He has developed renal insufficiency and most recent creatinine has increased from 1.67 to1.71.  His blood pressure today when taken by me was 142/70 with a resting pulse at 51.  He has continued to be on amlodipine 5 mg, losartan 50 mg, metoprolol succinate 50 mg daily to his rosuvastatin 40 mg.  He is on levothyroxine for hypothyroidism and 112 mcg.  He has continued to be on aspirin and Plavix for DAPT.  I had a long discussion with him today  particularly with reference to continued slight progression of his aortic stenosis.  He is now 88 years old.  Dr. Cyndia Bent had performed CABG revascularization surgery in 2009 and also performs TAVR.  I have recommended he see Dr. Cyndia Bent for evaluation and potential future candidacy for TAVR.  Ultimately he will need to undergo a right and left heart cardiac catheterization.  I would not schedule this presently pending his evaluation.  In the past I had reduced his losartan to 50 mg.  This may need to be further decreased if renal function worsens.  He also was instructed that if his resting heart rate is consistently less than 54 to reduce his metoprolol succinate to 25 mg.  If the decision is to continue watchful waiting in 6 months I have recommended a follow-up echo evaluation with office visit.  However pending Dr. Vivi Martens if the decision is to proceed with AVR sooner catheterization will need to be performed sooner.     Troy Sine, MD, [facility]  09/23/2021 11:12 AM

## 2021-09-23 NOTE — Patient Instructions (Signed)
Medication Instructions:   Start HCTZ 12.5 mg daily  Continue all other medications  *If you need a refill on your cardiac medications before your next appointment, please call your pharmacy*   Lab Work: None ordered   Testing/Procedures: None ordered   Follow-Up: At [facility], you and your health needs are our priority.  As part of our continuing mission to provide you with exceptional heart care, we have created designated Provider Care Teams.  These Care Teams include your primary Cardiologist (physician) and Advanced Practice Providers (APPs -  Physician Assistants and Nurse Practitioners) who all work together to provide you with the care you need, when you need it.  We recommend signing up for the patient portal called "MyChart".  Sign up information is provided on this After Visit Summary.  MyChart is used to connect with patients for Virtual Visits (Telemedicine).  Patients are able to view lab/test results, encounter notes, upcoming appointments, etc.  Non-urgent messages can be sent to your provider as well.   To learn more about what you can do with MyChart, go to [email or web address].    Your next appointment:  Feb   Call in Oct to schedule Feb appointment     The format for your next appointment: Office   Provider: [facility]   Important Information About Sugar

## 2021-09-24 ENCOUNTER — Encounter: Payer: Self-pay | Admitting: Cardiovascular Disease

## 2021-10-01 ENCOUNTER — Encounter: Payer: Self-pay | Admitting: Cardiovascular Disease

## 2021-10-01 MED ORDER — HYDROCHLOROTHIAZIDE 12.5 MG PO CAPS: 12.5000 mg | ORAL_CAPSULE | Freq: Every day | ORAL | 3 refills | Status: DC

## 2021-10-04 ENCOUNTER — Other Ambulatory Visit: Payer: Self-pay | Admitting: Cardiovascular Disease

## 2021-11-07 ENCOUNTER — Other Ambulatory Visit: Payer: Self-pay | Admitting: Cardiovascular Disease

## 2021-11-22 ENCOUNTER — Other Ambulatory Visit: Payer: Self-pay | Admitting: Cardiovascular Disease

## 2021-11-25 ENCOUNTER — Other Ambulatory Visit: Payer: Self-pay | Admitting: Cardiovascular Disease

## 2022-01-25 ENCOUNTER — Other Ambulatory Visit: Payer: Self-pay | Admitting: Physician Assistant

## 2022-01-25 DIAGNOSIS — Z952 Presence of prosthetic heart valve: Secondary | ICD-10-CM

## 2022-02-05 ENCOUNTER — Other Ambulatory Visit (HOSPITAL_COMMUNITY): Payer: PPO

## 2022-02-13 ENCOUNTER — Other Ambulatory Visit: Payer: Self-pay | Admitting: Cardiovascular Disease

## 2022-04-01 ENCOUNTER — Other Ambulatory Visit: Payer: Self-pay

## 2022-04-01 DIAGNOSIS — I6523 Occlusion and stenosis of bilateral carotid arteries: Secondary | ICD-10-CM

## 2022-04-07 ENCOUNTER — Encounter: Payer: Self-pay | Admitting: Cardiovascular Disease

## 2022-04-07 ENCOUNTER — Ambulatory Visit: Payer: PPO | Attending: Cardiovascular Disease | Admitting: Cardiovascular Disease

## 2022-04-07 VITALS — BP 132/74 | HR 50 | Ht 66.0 in | Wt 184.4 lb

## 2022-04-07 DIAGNOSIS — I2510 Atherosclerotic heart disease of native coronary artery without angina pectoris: Secondary | ICD-10-CM | POA: Diagnosis not present

## 2022-04-07 DIAGNOSIS — I10 Essential (primary) hypertension: Secondary | ICD-10-CM

## 2022-04-07 DIAGNOSIS — Z951 Presence of aortocoronary bypass graft: Secondary | ICD-10-CM | POA: Diagnosis not present

## 2022-04-07 DIAGNOSIS — I6523 Occlusion and stenosis of bilateral carotid arteries: Secondary | ICD-10-CM

## 2022-04-07 DIAGNOSIS — R600 Localized edema: Secondary | ICD-10-CM

## 2022-04-07 DIAGNOSIS — E039 Hypothyroidism, unspecified: Secondary | ICD-10-CM

## 2022-04-07 DIAGNOSIS — N1832 Chronic kidney disease, stage 3b: Secondary | ICD-10-CM

## 2022-04-07 DIAGNOSIS — Z952 Presence of prosthetic heart valve: Secondary | ICD-10-CM

## 2022-04-07 DIAGNOSIS — E785 Hyperlipidemia, unspecified: Secondary | ICD-10-CM | POA: Diagnosis not present

## 2022-04-07 MED ORDER — FUROSEMIDE 20 MG PO TABS: 20.0000 mg | ORAL_TABLET | Freq: Every day | ORAL | 3 refills | Status: DC

## 2022-04-07 NOTE — Progress Notes (Unsigned)
Patient ID: Jalique Crossen, male   DOB: [birth year], 89 y.o.   MRN: [id number]       HPI: Tsering Mardirossian, is a 89 y.o. male who presents to the office today for a 7 month follow-up cardiology evaluation.  In November 2009 Mr. Fabris underwent emergent CABG revascularization surgery after cardiac catheterization revealed severe life-threatening anatomy with 95% ostial left main stenosis a 99% ostial RCA stenosis. Surgery was done by Dr. Cyndia Bent and he had a LIMA to the LAD, vein to the obtuse marginal, vein to the RCA. His last nuclear perfusion study in April 2012 continued to show normal perfusion.  Mr. Kilday has documented carotid disease. A carotid Doppler study  in November 2013 which showed at least 60% stenosis in his carotid arteries bilaterally which was slightly increased from previously. A f/u carotid evaluation last year demonstrated his peak right internal carotid systolic velocity [id number] slightly increased from 240 in his left PICA systolic velocity up to 11 slightly increased from 200; 50-69% diameter reduction range bilaterally and was not significantly changed from one year ago.  On 12/01/2013 a follow-up study demonstrated a peak PICA velocity was now [id number] with diastolic velocity at 42 and the right carotid and 218 and 61 in the left carotid.  He remains asymptomatic and these place him in the upper end of scale in the 50-69% range  Additional problems include mixed hyperlipidemia and hypertension.  He has been on Toprol-XL 50 mg and losartan 100 mg in addition to amlodipine 5 mg for blood pressure control.. In the past he had derived marked benefit with Niaspan  as well as [name]. Follow-up laboratory on his current dose of [name] and niacin 1000 mg  revealed a total cholesterol 145, triglycerides 70, HDL 64, and LDL 67.  His glucose was 111.  TSH 4.5.  He had normal renal function with a BUN of 19 and creatinine of 1.1.  He underwent echo Doppler study on 06/06/2014.  This showed  an ejection fraction at 55-60%.  There was a small systolic gradient across his aortic valve with moderately calcified leaflets.  Valve area was 1.6 cm.  He had a mean gradient of 11 and a peak gradient of 23 mm suggestive of mild aortic stenosis.  PA pressure was 31 mm.  A nuclear perfusion study which remained normal with an ejection fraction of 59% and evidence for normal perfusion.  He underwent a F/U 2-D echo Doppler study on 08/20/2015 which showed an EF of 60-65%.  The aortic valve was calcified and thickened with mildly restricted motion.  Peak and mean gradients were 25 and 14 mm consistent with mild aortic stenosis with a valve area of 1.42 cm.  I scheduled him for follow-up carotid duplex exam which was done in July 2017.  This suggested progression of his right internal carotid stenoses with velocity now at 465/131, which places him in the greater than 80% range.  He had stable left internal carotid velocities now or in the 60-79% range.  He had normal subclavian arteries bilaterally, and patent vertebral arteries with antegrade flow.  I referred him to Dr. [name] who felt that with his asymptomatic status and low risk assessment that he should undergo carotid endarterectomy.  He was referred to Dr. [name] for an office evaluation but this has not yet been scheduled.  He was hospitalized in December 2017 with community-acquired pneumonia of the left lower lung and possible empyema.  He underwent thoracentesis as well as a  VATS procedure with decortication and left empyema drainage.  His carotid surgery with Dr. [name] was ultimately postponed but was successfully done on 03/26/2016 with right carotid endarterectomy.  Intraoperative findings included a 95% stenosis.  He was discharged the following day.    I saw him in October 2018 at which time he was doing well without chest pain, PND, orthopnea. He denies paresthesias, presyncope or syncope.  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  He had his fifth hole in one of his career on a par 3.  He recently had blood work done in [place] by his primary physician.  Renal function was stable, although potassium was upper normal at 5.2.  Lipid studies revealed cholesterol 148, triglycerides 134, HDL 52, LDL 69.  Thyroid function studies were normal.  Hemoglobin A1c was elevated at 6.4 and his estimated average glucose was 137.  He underwent a follow-up echo Doppler study in 11/04/2016.  This continued to show hyperdynamic LV function with an EF of 65-70% with moderate LVH.  Wall motion was normal.  His aortic valve gradient have slightly increased over the year, such that his peak gradient increased from 25 to 38 mm, and his mean gradient from 14 mm to 20 mmHg. Aortic valve area was 1.57 cm placing him still in the mild to mild to moderate AS category.    He had follow-up carotid imaging with Dr. [name] which showed a patent carotid endarterectomy site with no evidence for restenosis or hyperplasia.  Left internal carotid artery velocities suggested a 40 to 59% stenosis.  When I last saw him in September 2019 he denied any chest pain, presyncope or syncope or any symptoms of heart failure.  I reviewed recent laboratory done by his primary physician on March 04, 2017.  Chemistry was stable.  Total cholesterol was 125, triglycerides 73, HDL 49, and LDL 61.  TSH is 1.77.  Free T3 was 3.07 and free T4 1.0.  He was not anemic.  HbA1c 6.2  [name] underwent an evaluation by Jory Sims, NP in December 2019 after developing some mild irregularity to his heart rate was noted by his primary physician.  He was felt to have occasional to frequent PACs and had bigeminal PACs.  This ultimately resolved on its own.  On April 01, 2018 he underwent a follow-up echo Doppler study which continued to show normal LV function with an EF of 55 to 60%.  There was mitral annular calcification.  His aortic stenosis was in the moderate range with a  mean gradient now at 25 and a peak instantantaneous gradient at 41 with a valve area of 1.3 cm.  When that evaluation he remained asymptomatic and specifically denied chest pain, PND, orthopnea, presyncope or syncope.   I last saw him in September 2020 at which time he remained asymptomatic.  His wife had recently under gone knee surgery in August and they have been staying local instead of going down to [place].  He continues to deny any episodes of chest pain, change in exercise tolerance, palpitations, presyncope or syncope, or CHF symptomatology.  He is unaware of any palpitations.    Mr. Kramp underwent a 1 year follow-up echo Doppler study on April 18, 2019.  LV function remains normal with EF 60 to 65%.  There was grade 2 diastolic dysfunction and mild LVH.  He had moderately elevated pulmonary artery systolic pressure at 44 mm.  There was moderate aortic stenosis with a mean gradient of  24 and peak instantaneous gradient at 39 mmHg.  Aortic valve area was 1.04 cm.  There was mild aortic insufficiency.  When I saw him in April 2021 he remained asymptomatic and denied any chest pain or shortness of breath.  He denied any exertional dyspnea.   He had laboratory checked in February 2021 which showed a total cholesterol 138 HDL 57 LDL 62 and triglycerides 94.  During that evaluation I reviewed his echo Doppler with him in detail and his gradients had not significantly changed from prior evaluation.  I saw him in November 2021.  Since his prior evaluation he continued to do well. He was at [facility] in [place] and is beach, golf [place] house.  He played golf 6 times without chest pain or shortness of breath.  He states his heart rate typically runs in the 50s.  He denies any chest pain, dizziness or shortness of breath.  During that evaluation his blood pressure was mildly elevated and he was on amlodipine 5 mg, losartan 100 mg in addition to metoprolol succinate 50 mg.  He was mildly  bradycardic without symptoms.  In April 2022 I recommended follow-up echo Doppler study to reassess his aortic valve stenosis.  He was continuing DAPT therapy with aspirin/Plavix and was on levothyroxine for hypothyroidism.  He underwent follow-up carotid duplex imaging and was seen at [facility] by Laurence Slate, PA-C.  He is status post right carotid endarterectomy in February 2015.  Carotid studies were reviewed which showed mild 1 to 39% stenoses bilaterally.  He had normal vertebral and subclavian flow.  He underwent an echo Doppler study on May 10, 2020.  This continued to show normal EF of 55 to 60%.  There was mild LVH.  There were no wall motion abnormalities.  He had severe mitral annular calcification without mitral stenosis.  His mean aortic valve gradient had increased to 40.4 mmHg with a peak gradient at 70.4.  Aortic valve area was 1.02 cm.  There was mild dilation of his ascending aorta at 36 mm..  I saw him May 2022.  At that time he remained completely asymptomatic. He was continuing to play golf regularly and denies any change in exercise capacity while playing golf.  However, if he attempts to walk very fast he does note some very mild shortness of breath.  He is unaware of palpitations.  His blood pressure has been stable.  He had undergone follow-up lipid studies on April 26, 2020 which showed an LDL at 59 total cholesterol 131 and triglycerides at 88 with HDL at 54.  His most recent creatinine had increased to 1.67.  During that evaluation I had an extensive discussion with him concerning his April 2022 echo Doppler findings regarding his aortic stenosis and I spent considerable time reviewing the natural history of aortic stenosis and potential progression.  I saw him on September 24, 2020 and since his prior evaluation he continued to be asymptomatic.  He continues to be symptom-free.  I had recommended he undergo a follow-up echo Doppler study which was done on September 06, 2020.  Ejection  fraction continues to be excellent at 60 to 65%.  There is mild LVH and mild grade 1 diastolic dysfunction with elevated left atrial pressure.  His mean aortic gradient has increased from 40 to 47 mm and his peak instantaneous gradient from 70 to 77 mm.  Estimated aortic valve area is 0.98 cm2.  During that evaluation, with his progressive aortic stenosis I recommended an initial evaluation  with Dr. Cyndia Bent since he had performed his CABG revascularization surgery in 2009 for consideration of potential future candidacy for TAVR.  He apparently was seen by Dr. Burt Knack on October 29, 2020.  He remained functionally independent who was playing golf several times per week, performing yard work and chores around his house without symptoms.  He was felt to have severe stage C aortic stenosis and after lengthy discussion with Dr. Burt Knack concerning likely future progression of his aortic stenosis he was counseled extensively to watch for symptoms of progressive fatigue, dyspnea, chest discomfort, lightheadedness or syncope.  He underwent a follow-up echo Doppler study on March 19, 2021 which continues to show normal LV function with mild LVH with EF 60 to 65%.  He again was found to have severe aortic stenosis and there was slight progression of his mean gradient 47 up to 53 mmHg with a peak gradient of 83.2 mmHg.  There was moderate mitral annular calcification.  I last saw him on April 08, 2021.  At that time he felt well and denied any development of symptoms.  He had recently been evaluated at [facility] in follow-up of his carotid artery disease.  He is status post right CEA by Dr. [name] in February 2018 and he remained asymptomatic with slight increase of the left ICA now in the 40 to 59% range with right ICA at 1 to 39%.  He denied chest pain PND orthopnea, presyncope or syncope or palpitations.  During that evaluation, with his progressive severe aortic stenosis I referred him to Dr. Burt Knack for structural heart  evaluation with possible TAVR.  He saw Dr. Burt Knack on April 25, 2021 who felt the patient had progressive, severe aortic stenosis, stage D1 with only very mild symptoms of fatigue and exertion dyspnea, New York Heart Association class II.  It was felt that with his advanced age and prior cardiac surgery TAVR would be the treatment of choice if his anatomy is conducive.  He was sent back to me to perform right and left heart catheterization which was done on May 14, 2021.  He was found to have very mildly elevated right heart pressures.  His aortic valve was severely calcified with a mean gradient of 53 and peak gradient of 83.  He had severe native CAD with 95% eccentric ostial left main stenosis, 40% first diagonal stenosis of the LAD, 50% proximal stenosis of the circumflex and ostial occlusion of the RCA.  He had an atretic very small caliber LIMA graft supplying the mid distal LAD.  The vein graft supplying the circumflex vessel was patent with 40 to 50% proximal third stenosis.  The vein graft supplying the RCA was patent with filling of the RCA retrograde to the ostium.  There was a valve in the body of the graft with narrowing of 65% proximally and 50% distal to the valve.  On Jun 05, 2021 he underwent successful IVUS guided PCI of severe 90% stenosis at the ostium of the left main stent treated with 4.5 x 12 mm Synergy DES stent.  He underwent successful TAVR with a 23 mm Edwards SAPIEN 3 ultra Resilia THV valve via the transfemoral approach on August 13, 2021.  Postoperative echo showed EF 60% with mild asymmetric LVH, normally functioning TAVR with a mean gradient of 12 and trivial PVL.  He developed a new left bundle branch block and he underwent ZIO monitoring.  He was seen by Angelena Form August 16 for follow-up.  Zio patch monitor showed an  average rate of 59 with a range of 45-91 with very rare ectopy with no episodes of A-fib or prolonged pauses.  Repeat echo now showed a mean gradient  of 20 mm across his bioprosthetic valve.  Presently when I last saw him on September 03, 2021 he felt well and remained stable.  He was experiencing some mild  swelling of his legs he was unaware of any arrhythmia.  He denied presyncope or syncope.  He presents for evaluation.  Past Medical History:  Diagnosis Date   CAD (coronary artery disease)    History of hiatal hernia    Hypertension    Hypothyroidism    Pneumonia 12/2015   hx   S/P CABG x 3 12/17/2007   LIMA to LAD,SVG to left C   S/P TAVR (transcatheter aortic valve replacement) 08/13/2021   s/p TAVR with a 23 mm Edwards S3UR via the TF approach by Dr. Burt Knack & Dr. Cyndia Bent   Severe aortic stenosis    s/p TAVR    Past Surgical History:  Procedure Laterality Date   CORONARY ARTERY BYPASS GRAFT  12/17/07   LIMA to LAD,vein to obtuse marginal,vein to RCA   CORONARY STENT INTERVENTION N/A 06/05/2021   Procedure: CORONARY STENT INTERVENTION;  Surgeon: Sherren Mocha, MD;  Location: [facility];  Service: Cardiovascular;  Laterality: N/A;   EMPYEMA DRAINAGE Left 01/11/2016   Procedure: EMPYEMA DRAINAGE;  Surgeon: Ivin Poot, MD;  Location: [facility];  Service: Thoracic;  Laterality: Left;   ENDARTERECTOMY Right 03/26/2016   Procedure: RIGHT CAROTID ENDARTERECTOMY;  Surgeon: Serafina Mitchell, MD;  Location: [facility];  Service: Vascular;  Laterality: Right;   INTRAOPERATIVE TRANSTHORACIC ECHOCARDIOGRAM N/A 08/13/2021   Procedure: INTRAOPERATIVE TRANSTHORACIC ECHOCARDIOGRAM;  Surgeon: Sherren Mocha, MD;  Location: [facility];  Service: Open Heart Surgery;  Laterality: N/A;   PATCH ANGIOPLASTY Right 03/26/2016   Procedure: PATCH ANGIOPLASTY USING Rueben Bash BIOLOGIC PATCH;  Surgeon: Serafina Mitchell, MD;  Location: [facility];  Service: Vascular;  Laterality: Right;   RIGHT HEART CATH AND CORONARY/GRAFT ANGIOGRAPHY N/A 05/14/2021   Procedure: RIGHT HEART CATH AND CORONARY/GRAFT ANGIOGRAPHY;  Surgeon: Troy Sine, MD;  Location: [facility];  Service: Cardiovascular;  Laterality: N/A;   TRANSCATHETER AORTIC VALVE REPLACEMENT, TRANSFEMORAL N/A 08/13/2021   Procedure: Transcatheter Aortic Valve Replacement, Transfemoral;  Surgeon: Sherren Mocha, MD;  Location: [facility];  Service: Open Heart Surgery;  Laterality: N/A;   ULTRASOUND GUIDANCE FOR VASCULAR ACCESS Bilateral 08/13/2021   Procedure: ULTRASOUND GUIDANCE FOR VASCULAR ACCESS, BILATERAL FEMORAL ARTERIES AND LEFT FEMORAL VEIN;  Surgeon: Sherren Mocha, MD;  Location: [facility];  Service: Open Heart Surgery;  Laterality: Bilateral;   VIDEO ASSISTED THORACOSCOPY (VATS)/DECORTICATION Left 01/11/2016   Procedure: VIDEO ASSISTED THORACOSCOPY (VATS)/DECORTICATION;  Surgeon: Ivin Poot, MD;  Location: [facility];  Service: Thoracic;  Laterality: Left;    No Known Allergies  Current Outpatient Medications  Medication Sig Dispense Refill   amLODipine (NORVASC) 10 MG tablet Take 1 tablet (10 mg total) by mouth daily. 180 tablet 3   aspirin EC 81 MG tablet Take 81 mg by mouth at bedtime.      Cholecalciferol (VITAMIN D) 2000 units tablet Take 2,000 Units by mouth at bedtime.     clopidogrel (PLAVIX) 75 MG tablet Take 1 tablet by mouth daily (please keep scheduled appointment) 90 tablet 3   Cyanocobalamin (B-12) 2000 MCG TABS Take 2,000 mcg by mouth daily.     fexofenadine (ALLEGRA) 180 MG tablet Take  180 mg by mouth daily.     folic acid (FOLVITE) 1 MG tablet Take 1 tablet by mouth daily 90 tablet 3   furosemide (LASIX) 20 MG tablet Take 1 tablet (20 mg total) by mouth daily. 90 tablet 3   levothyroxine (SYNTHROID, LEVOTHROID) 112 MCG tablet Take 112 mcg by mouth daily before breakfast.     metoprolol succinate (TOPROL-XL) 50 MG 24 hr tablet Take 1 tablet by mouth daily with or immediately following a meal 90 tablet 2   mupirocin cream (BACTROBAN) 2 % Apply 1 application topically daily as needed (after skin treatments).      Omega-3 1400 MG CAPS Take 1,400 mg by mouth daily.      rosuvastatin (CRESTOR) 40 MG tablet Take 1 tablet by mouth every day 90 tablet 1   terbinafine (LAMISIL) 1 % cream Apply 1 application topically daily as needed (rash).      No current facility-administered medications for this visit.    Socially he is married. There are no children. He  remains active and plays golf, yard work, and household chores.  He does walk. There is no tobacco use. He does drink occasional alcohol.  ROS General: Negative; No fevers, chills, or night sweats;  HEENT: Negative; No changes in vision or hearing, sinus congestion, difficulty swallowing Pulmonary: Negative; No cough, wheezing, shortness of breath, hemoptysis Cardiovascular:  See HPI GI: Negative; No nausea, vomiting, diarrhea, or abdominal pain GU: Negative; No dysuria, hematuria, or difficulty voiding Musculoskeletal: Negative; no myalgias, joint pain, or weakness Hematologic/Oncology: Negative; no easy bruising, bleeding Endocrine: Positive for hypothyroidism on Synthroid replacement. Neuro: Negative; no changes in balance, headaches Skin: Negative; No rashes or skin lesions Psychiatric: Negative; No behavioral problems, depression Sleep: Negative; No snoring, daytime sleepiness, hypersomnolence, bruxism, restless legs, hypnogognic hallucinations, no cataplexy Other comprehensive 14 point system review is negative.   PE BP 132/74   Pulse (!) 50   Ht '5\' 6"'$  (1.676 m)   Wt 184 lb 6.4 oz (83.6 kg)   SpO2 97%   BMI 29.76 kg/m    Repeat blood pressure by me was 149/78  Wt Readings from Last 3 Encounters:  04/07/22 184 lb 6.4 oz (83.6 kg)  09/23/21 178 lb 12.8 oz (81.1 kg)  09/11/21 179 lb (81.2 kg)    General: Alert, oriented, no distress.  Skin: normal turgor, no rashes, warm and dry HEENT: Normocephalic, atraumatic. Pupils equal round and reactive to light; sclera anicteric; extraocular muscles intact; Nose without nasal septal hypertrophy Mouth/Parynx benign; Mallinpatti scale3 Neck: No  JVD, no carotid bruits; normal carotid upstroke Lungs: clear to ausculatation and percussion; no wheezing or rales Chest wall: without tenderness to palpitation Heart: PMI not displaced, RRR, s1 s2 normal, 1/6 systolic murmur, no diastolic murmur, no rubs, gallops, thrills, or heaves Abdomen: soft, nontender; no hepatosplenomehaly, BS+; abdominal aorta nontender and not dilated by palpation. Back: no CVA tenderness Pulses 2+ Musculoskeletal: full range of motion, normal strength, no joint deformities Extremities: 1+ tense lower extremity edema right greater than left leg; no clubbing cyanosis, Homan's sign negative  Neurologic: grossly nonfocal; Cranial nerves grossly wnl Psychologic: Normal mood and affect    April 07, 2022 ECG (independently read by me):  Sinus bradycardia at 57, Nonspecific IVCD  September 23, 2021 ECG (independently read by me): Sinus bradycardia at 53, 1st degree AV block   April 08, 2021 ECG (independently read by me):  Sinus bradycardia at 47, 1st degree AV block; 1st degree AV block, PR 222 msec  September 24, 2020 ECG (independently read by me): Sinus bradycardia at 51, 1 st degree AV block, PR 220 msec; QTc 401 msec  May 29, 2020 ECG (independently read by me): Sinus bradycardia 51 bpm, PACs, first-degree AV block with a PR 212 ms.  There is no evidence for LV strain or LVH.  November 2021 ECG (independently read by me): Sinus bradycardia at 48 bpm, first-degree AV block, PR 216 ms.  No ectopy.  No ECG changes.  QTc interval normal at 398 ms  April14, 2021 ECG (independently read by me): Sinus bradycardia at 54 bpm with mild sinus arrhythmia.  First-degree AV block with a PR interval at 214 ms.  No ectopy.  September 2020 ECG (independently read by me): Sinus bradycardia 54 bpm, isolated PAC, RV conduction delay/incomplete right bundle branch block.  PR interval 202 ms.  March 2020 ECG (independently read by me): Sinus rhythm at 60 bpm with mild sinus arrhythmia.   Normal intervals  September 2019 ECG (independently read by me): Sinus bradycardia at 51 bpm.  Mild RV conduction delay.  No significant ST changes.  PR interval 200 ms.  October 2018 ECG (independently read by me): Normal sinus rhythm with PACs.  PR interval 200 ms, QTc interval 412 ms.  April 2018 ECG (independently read by me): Normal sinus rhythm with sinus arrhythmia with an average heart rate at 60 bpm.  Normal intervals.  Small inferior Q waves.  February 2017 ECG (independently read by me): Sinus bradycardia with mild sinus arrhythmia at 58 bpm.  Mild RV conduction delay.  No significant ST segment changes.  July 2016 ECG (independently read by me): Sinus bradycardia with mild sinus arrhythmia, heart rate ranging from 48-58.  December 2014 ECG: Normal sinus rhythm at 54 beats per minute; normal intervals.  LABS: I personally reviewed the blood work  from [facility] done on 07/19/2015.   Total cholesterol 142, triglycerides 120, HDL 61, LDL 57.  I extensively reviewed laboratory from [facility] primary care from 08/29/2016  I personally reviewed the laboratory as noted above from February 2019 Total cholesterol 125, triglycerides 73, HDL 49, LDL 61.  Personally reviewed lab work from March 22, 2019.     Latest Ref Rng & Units 08/14/2021    2:39 AM 08/13/2021   10:30 AM 08/13/2021    8:00 AM  BMP  Glucose 70 - 99 mg/dL 117  128    BUN 8 - 23 mg/dL 28  24    Creatinine 0.61 - 1.24 mg/dL 1.66  1.60    Sodium 135 - 145 mmol/L 136  141  140   Potassium 3.5 - 5.1 mmol/L 4.3  4.6  4.4   Chloride 98 - 111 mmol/L 106  107    CO2 22 - 32 mmol/L 22     Calcium 8.9 - 10.3 mg/dL 8.8         Latest Ref Rng & Units 08/09/2021    8:27 AM 05/03/2021    8:34 PM 03/18/2016   10:50 AM  Hepatic Function  Total Protein 6.5 - 8.1 g/dL 6.9  7.9  7.7   Albumin 3.5 - 5.0 g/dL 3.6  4.6  4.2   AST 15 - 41 U/L '17  18  22   '$ ALT 0 - 44 U/L '14  11  16   '$ Alk Phosphatase 38 - 126 U/L 63  61  70   Total  Bilirubin 0.3 - 1.2 mg/dL 0.5  0.4  0.6  Latest Ref Rng & Units 08/14/2021    2:39 AM 08/13/2021   10:30 AM 08/13/2021    8:00 AM  CBC  WBC 4.0 - 10.5 K/uL 7.7     Hemoglobin 13.0 - 17.0 g/dL 10.8  10.2  11.6   Hematocrit 39.0 - 52.0 % 33.1  30.0  34.0   Platelets 150 - 400 K/uL 119      Lab Results  Component Value Date   MCV 99.1 08/14/2021   MCV 100.8 (H) 08/09/2021   MCV 100.6 (H) 06/06/2021   Lab Results  Component Value Date   TSH 4.322 01/10/2016   Lab Results  Component Value Date   HGBA1C 7.2 (H) 01/10/2016     Lipid Panel     Component Value Date/Time   CHOL 145 01/18/2014 0803   CHOL 139 08/30/2012 0845   TRIG 70 01/18/2014 0803   TRIG 82 08/30/2012 0845   HDL 64 01/18/2014 0803   HDL 68 08/30/2012 0845   CHOLHDL 2.3 01/18/2014 0803   VLDL 14 01/18/2014 0803   LDLCALC 67 01/18/2014 0803   LDLCALC 55 08/30/2012 0845    ECHO:09/06/2020  1. Severe AS with mean gradient 47 mmg; DI 0.26; mild AI.   2. Left ventricular ejection fraction, by estimation, is 60 to 65%. The  left ventricle has normal function. The left ventricle has no regional  wall motion abnormalities. There is mild left ventricular hypertrophy.  Left ventricular diastolic parameters  are consistent with Grade I diastolic dysfunction (impaired relaxation).  Elevated left atrial pressure.   3. Right ventricular systolic function is normal. The right ventricular  size is normal. There is normal pulmonary artery systolic pressure.   4. Left atrial size was moderately dilated.   5. Right atrial size was moderately dilated.   6. The mitral valve is normal in structure. Trivial mitral valve  regurgitation. No evidence of mitral stenosis. Moderate mitral annular  calcification.   7. The aortic valve is tricuspid. Aortic valve regurgitation is mild.  Severe aortic valve stenosis.   8. The inferior vena cava is normal in size with greater than 50%  respiratory variability, suggesting right  atrial pressure of 3 mmHg.   Comparison(s): No significant change from prior study.   FINDINGS   Left Ventricle: Left ventricular ejection fraction, by estimation, is 60  to 65%. The left ventricle has normal function. The left ventricle has no  regional wall motion abnormalities. The left ventricular internal cavity  size was normal in size. There is   mild left ventricular hypertrophy. Left ventricular diastolic parameters  are consistent with Grade I diastolic dysfunction (impaired relaxation).  Elevated left atrial pressure.   Right Ventricle: The right ventricular size is normal. Right ventricular  systolic function is normal. There is normal pulmonary artery systolic  pressure. The tricuspid regurgitant velocity is 2.67 m/s, and with an  assumed right atrial pressure of 3 mmHg,   the estimated right ventricular systolic pressure is 0000000 mmHg.   Left Atrium: Left atrial size was moderately dilated.   Right Atrium: Right atrial size was moderately dilated.   Pericardium: There is no evidence of pericardial effusion.   Mitral Valve: The mitral valve is normal in structure. Moderate mitral  annular calcification. Trivial mitral valve regurgitation. No evidence of  mitral valve stenosis.   Tricuspid Valve: The tricuspid valve is normal in structure. Tricuspid  valve regurgitation is mild . No evidence of tricuspid stenosis.   Aortic Valve: The aortic valve is tricuspid.  Aortic valve regurgitation is  mild. Aortic regurgitation PHT measures 533 msec. Severe aortic stenosis  is present. Aortic valve mean gradient measures 47.5 mmHg. Aortic valve  peak gradient measures 77.1 mmHg.  Aortic valve area, by VTI measures 0.98 cm.   Pulmonic Valve: The pulmonic valve was not well visualized. Pulmonic valve  regurgitation is not visualized. No evidence of pulmonic stenosis.   Aorta: The aortic root is normal in size and structure.   Venous: The inferior vena cava is normal in size  with greater than 50%  respiratory variability, suggesting right atrial pressure of 3 mmHg.   IAS/Shunts: No atrial level shunt detected by color flow Doppler.   Additional Comments: Severe AS with mean gradient 47 mmg; DI 0.26; mild  AI.      LEFT VENTRICLE  PLAX 2D  LVIDd:         4.70 cm  Diastology  LVIDs:         3.40 cm  LV e' medial:    4.14 cm/s  LV PW:         1.20 cm  LV E/e' medial:  24.0  LV IVS:        1.30 cm  LV e' lateral:   6.32 cm/s  LVOT diam:     2.20 cm  LV E/e' lateral: 15.7  LV SV:         114  LV SV Index:   61  LVOT Area:     3.80 cm                             3D Volume EF:                          3D EF:        66 %                          LV EDV:       119 ml                          LV ESV:       41 ml                          LV SV:        79 ml   RIGHT VENTRICLE  RV S prime:     10.30 cm/s  TAPSE (M-mode): 1.4 cm   LEFT ATRIUM             Index       RIGHT ATRIUM           Index  LA diam:        3.40 cm 1.82 cm/m  RA Area:     23.40 cm  LA Vol (A2C):   56.8 ml 30.41 ml/m RA Volume:   77.70 ml  41.61 ml/m  LA Vol (A4C):   51.9 ml 27.79 ml/m  LA Biplane Vol: 57.5 ml 30.79 ml/m   AORTIC VALVE  AV Area (Vmax):    1.03 cm  AV Area (Vmean):   0.94 cm  AV Area (VTI):     0.98 cm  AV Vmax:           439.00 cm/s  AV Vmean:  332.500 cm/s  AV VTI:            1.155 m  AV Peak Grad:      77.1 mmHg  AV Mean Grad:      47.5 mmHg  LVOT Vmax:         119.00 cm/s  LVOT Vmean:        82.000 cm/s  LVOT VTI:          0.299 m  LVOT/AV VTI ratio: 0.26  AI PHT:            533 msec     AORTA  Ao Root diam: 3.10 cm  Ao Asc diam:  3.70 cm   MITRAL VALVE                TRICUSPID VALVE  MV Area (PHT): 2.37 cm     TR Peak grad:   28.5 mmHg  MV Decel Time: 320 msec     TR Vmax:        267.00 cm/s  MV E velocity: 99.20 cm/s  MV A velocity: 122.00 cm/s  SHUNTS  MV E/A ratio:  0.81         Systemic VTI:  0.30 m                               Systemic Diam: 2.20 cm     ECHO: 03/19/2021  1. Left ventricular ejection fraction, by estimation, is 60 to 65%. The  left ventricle has normal function. The left ventricle has no regional  wall motion abnormalities. There is mild left ventricular hypertrophy.  Left ventricular diastolic parameters  were normal.   2. Right ventricular systolic function is normal. The right ventricular  size is normal. There is normal pulmonary artery systolic pressure.   3. Left atrial size was moderately dilated.   4. The mitral valve is degenerative. Trivial mitral valve regurgitation.  No evidence of mitral stenosis. Moderate mitral annular calcification.   5. Gradients have increased and AVA decreased since echo done 8.11/22.  The aortic valve is tricuspid. There is severe calcifcation of the aortic  valve. There is severe thickening of the aortic valve. Aortic valve  regurgitation is mild. Severe aortic  valve stenosis.   6. The inferior vena cava is normal in size with greater than 50%  respiratory variability, suggesting right atrial pressure of 3 mmHg.   FINDINGS   Left Ventricle: Left ventricular ejection fraction, by estimation, is 60  to 65%. The left ventricle has normal function. The left ventricle has no  regional wall motion abnormalities. The left ventricular internal cavity  size was normal in size. There is   mild left ventricular hypertrophy. Left ventricular diastolic parameters  were normal.   Right Ventricle: The right ventricular size is normal. No increase in  right ventricular wall thickness. Right ventricular systolic function is  normal. There is normal pulmonary artery systolic pressure. The tricuspid  regurgitant velocity is 2.78 m/s, and   with an assumed right atrial pressure of 3 mmHg, the estimated right  ventricular systolic pressure is [id number] mmHg.   Left Atrium: Left atrial size was moderately dilated.   Right Atrium: Right atrial size was normal in size.    Pericardium: There is no evidence of pericardial effusion.   Mitral Valve: The mitral valve is degenerative in appearance. There is  moderate thickening of the mitral valve leaflet(s). There is moderate  calcification of  the mitral valve leaflet(s). Moderate mitral annular  calcification. Trivial mitral valve  regurgitation. No evidence of mitral valve stenosis.   Tricuspid Valve: The tricuspid valve is normal in structure. Tricuspid  valve regurgitation is mild . No evidence of tricuspid stenosis.   Aortic Valve: Gradients have increased and AVA decreased since echo done  8.11/22. The aortic valve is tricuspid. There is severe calcifcation of  the aortic valve. There is severe thickening of the aortic valve. Aortic  valve regurgitation is mild. Aortic  regurgitation PHT measures 504 msec. Severe aortic stenosis is present.  Aortic valve mean gradient measures 53.0 mmHg. Aortic valve peak gradient  measures 83.2 mmHg. Aortic valve area, by VTI measures 0.80 cm.   Pulmonic Valve: The pulmonic valve was normal in structure. Pulmonic valve  regurgitation is not visualized. No evidence of pulmonic stenosis.   Aorta: The aortic root is normal in size and structure.   Venous: The inferior vena cava is normal in size with greater than 50%  respiratory variability, suggesting right atrial pressure of 3 mmHg.   IAS/Shunts: No atrial level shunt detected by color flow Doppler.      LEFT VENTRICLE  PLAX 2D  LVIDd:         4.50 cm   Diastology  LVIDs:         2.60 cm   LV e' medial:    5.00 cm/s  LV PW:         0.90 cm   LV E/e' medial:  22.4  LV IVS:        1.30 cm   LV e' lateral:   9.03 cm/s  LVOT diam:     2.00 cm   LV E/e' lateral: 12.4  LV SV:         103  LV SV Index:   55        2D Longitudinal Strain  LVOT Area:     3.14 cm  2D Strain GLS (A2C):   -16.6 %                           2D Strain GLS (A3C):   -18.8 %                           2D Strain GLS (A4C):   -18.1 %                            2D Strain GLS Avg:     -17.8 %   RIGHT VENTRICLE  RV Basal diam:  4.15 cm  RV Mid diam:    3.30 cm  RV S prime:     10.40 cm/s  TAPSE (M-mode): 2.6 cm  RVSP:           33.9 mmHg   LEFT ATRIUM             Index        RIGHT ATRIUM           Index  LA diam:        4.40 cm 2.33 cm/m   RA Pressure: 3.00 mmHg  LA Vol (A2C):   67.3 ml 35.64 ml/m  RA Area:     16.90 cm  LA Vol (A4C):   69.2 ml 36.64 ml/m  RA Volume:   43.10 ml  22.82 ml/m  LA Biplane Vol: 66.7 ml  35.32 ml/m   AORTIC VALVE  AV Area (Vmax):    0.75 cm  AV Area (Vmean):   0.71 cm  AV Area (VTI):     0.80 cm  AV Vmax:           456.00 cm/s  AV Vmean:          352.000 cm/s  AV VTI:            1.290 m  AV Peak Grad:      83.2 mmHg  AV Mean Grad:      53.0 mmHg  LVOT Vmax:         109.00 cm/s  LVOT Vmean:        79.600 cm/s  LVOT VTI:          0.329 m  LVOT/AV VTI ratio: 0.26  AI PHT:            504 msec     AORTA  Ao Root diam: 3.10 cm  Ao Asc diam:  3.50 cm   MITRAL VALVE                TRICUSPID VALVE  MV Area (PHT):              TR Peak grad:   30.9 mmHg  MV Decel Time:              TR Vmax:        278.00 cm/s  MV E velocity: 112.00 cm/s  Estimated RAP:  3.00 mmHg  MV A velocity: 101.00 cm/s  RVSP:           33.9 mmHg  MV E/A ratio:  1.11                              SHUNTS                              Systemic VTI:  0.33 m                              Systemic Diam: 2.00 cm   IMPRESSION:  1. S/P TAVR (transcatheter aortic valve replacement)   2. CAD in native artery   3. Hx of CABG: November 2009   4. Primary hypertension   5. Bilateral lower extremity edema   6. Hyperlipidemia with target LDL less than 70   7. Carotid artery stenosis status post right carotid endarterectomy 03/26/2016   8. Stage 3b chronic kidney disease ([place])   9. Hypothyroidism, unspecified type    ASSESSMENT AND PLAN Mr.Roselli is an 89 year old gentleman who underwent emergent CABG revascularization  surgery for life-threatening coronary anatomy in November 2009 by Dr.Bartle.  He has a history of peripheral vascular disease and underwent successful right carotid endarterectomy by Dr. [name]. Carotid studies from February 2022 and and February 2023 showed stable velocities in the right carotid with slight increase in the left carotid now in the 40 to 59% range.  He was followed closely by me with reference to his aortic stenosis which showed cyst and slow progression to ultimately being severe.  He underwent structural heart team evaluation with Dr. Burt Knack on October 29, 2020 after his mean gradient had increased to 47 mmHg.  At that time he was felt to have severe, stage C aortic stenosis but remained entirely asymptomatic.  A subsequent echo Doppler study from February 2022 again shows slight progression with his peak instantaneous gradient now increasing to 83 mm and his mean gradient 53 mmHg.  Due to progression of disease with very mild symptoms and stage D1 severe AS he underwent right and left heart catheterization by me on May 14, 2020.  Right heart pressures were only minimally elevated and severe AS was verified with a mean gradient of 53 and peak gradient of 83.  Due to high-grade ostial left main stenosis, he underwent successful ostial stenting of his left main IVUS guided on Jun 05, 2021 by Dr. Burt Knack.  On August 13, 2021 he underwent successful TAVR with 23 mm Edwards SAPIEN 3 ultra Resilia THV via the TF approach.  Initial postoperative echo showed a mean gradient of 12 and on repeat 1 month later the mean gradient had increased slightly to 20 mm with his 23 mm valve.  When last seen by me blood pressure was slightly increased and with mild lower extremity edema I suggested the addition of HCTZ 12.5 mg to his daily regimen of amlodipine 10 mg and metoprolol succinate 50 mg.  He has continued to be on DAPT with his left main ostial stent has been on rosuvastatin 40 mg and omega-3 fatty acid for  mixed hyperlipidemia.  His blood pressure today is upper normal.  He does have 1+ tense lower extremity edema right greater than left leg.  I have suggested he discontinue hydrochlorothiazide and in its place initiate furosemide at 20 mg.  He will continue his dose of amlodipine, metoprolol therapy.  Apparently, he had not reduced the amlodipine down to 5 mg.  He is followed at [facility].  He is scheduled undergo follow-up carotid duplex imaging on March 25 and vascular surgery follow-up on the same day.  On July 17 he is scheduled to undergo follow-up echo Doppler assessment and office visit with Kathlene November, PA-C in structural heart clinic.  He continues to be on levothyroxine for hypothyroidism.  As long as he remains stable I will see him in October for reevaluation.  Troy Sine, MD, [facility]  04/08/2022 6:11 PM

## 2022-04-07 NOTE — Patient Instructions (Addendum)
Medication Instructions:  Stop taking HCTZ ( hydrochlorothiazide)   Start taking Furosemide 20 mg daily   *If you need a refill on your cardiac medications before your next appointment, please call your pharmacy*   Lab Work: Not needed    Testing/Procedures: Not needed   Follow-Up: At [facility], you and your health needs are our priority.  As part of our continuing mission to provide you with exceptional heart care, we have created designated Provider Care Teams.  These Care Teams include your primary Cardiologist (physician) and Advanced Practice Providers (APPs -  Physician Assistants and Nurse Practitioners) who all work together to provide you with the care you need, when you need it.     Your next appointment:   7 month(s)  The format for your next appointment:   In Person  Provider:   Shelva Majestic, MD

## 2022-04-08 ENCOUNTER — Encounter: Payer: Self-pay | Admitting: Cardiovascular Disease

## 2022-04-21 ENCOUNTER — Ambulatory Visit (INDEPENDENT_AMBULATORY_CARE_PROVIDER_SITE_OTHER): Payer: PPO | Admitting: Physician Assistant

## 2022-04-21 ENCOUNTER — Ambulatory Visit (HOSPITAL_COMMUNITY)
Admission: RE | Admit: 2022-04-21 | Discharge: 2022-04-21 | Disposition: A | Discharge status: Home / Self Care | Payer: PPO | Source: Ambulatory Visit | Attending: Surgery | Admitting: Surgery

## 2022-04-21 VITALS — BP 146/74 | HR 50 | Temp 97.8°F | Resp 16 | Ht 66.0 in | Wt 181.0 lb

## 2022-04-21 DIAGNOSIS — I6523 Occlusion and stenosis of bilateral carotid arteries: Secondary | ICD-10-CM

## 2022-04-23 NOTE — Progress Notes (Unsigned)
Office Note   History of Present Illness   Dillon [name] is a 89 y.o. (02-Jun-1933) male who presents with chief complaint: ***.  Previous carotid studies demonstrated: RICA ***% stenosis, LICA ***% stenosis.  Patient has *** history of TIA or stroke symptom.  The patient has *** had amaurosis fugax or monocular blindness.  The patient has *** had facial drooping or hemiplegia.  The patient has *** had receptive or expressive aphasia.    ***The patient's previous neurologic deficits have *** resolved.  The patient's PMH, PSH, SH, and FamHx were reviewed on *** are unchanged from ***.  Current Outpatient Medications  Medication Sig Dispense Refill   amLODipine (NORVASC) 10 MG tablet Take 1 tablet (10 mg total) by mouth daily. 180 tablet 3   aspirin EC 81 MG tablet Take 81 mg by mouth at bedtime.      Cholecalciferol (VITAMIN D) 2000 units tablet Take 2,000 Units by mouth at bedtime.     clopidogrel (PLAVIX) 75 MG tablet Take 1 tablet by mouth daily (please keep scheduled appointment) 90 tablet 3   Cyanocobalamin (B-12) 2000 MCG TABS Take 2,000 mcg by mouth daily.     fexofenadine (ALLEGRA) 180 MG tablet Take 180 mg by mouth daily.     folic acid (FOLVITE) 1 MG tablet Take 1 tablet by mouth daily 90 tablet 3   furosemide (LASIX) 20 MG tablet Take 1 tablet (20 mg total) by mouth daily. 90 tablet 3   levothyroxine (SYNTHROID, LEVOTHROID) 112 MCG tablet Take 112 mcg by mouth daily before breakfast.     metoprolol succinate (TOPROL-XL) 50 MG 24 hr tablet Take 1 tablet by mouth daily with or immediately following a meal 90 tablet 2   mupirocin cream (BACTROBAN) 2 % Apply 1 application topically daily as needed (after skin treatments).      Omega-3 1400 MG CAPS Take 1,400 mg by mouth daily.     rosuvastatin (CRESTOR) 40 MG tablet Take 1 tablet by mouth every day 90 tablet 1   terbinafine (LAMISIL) 1 % cream Apply 1 application topically daily as needed (rash).      No current facility-administered  medications for this visit.    ***REVIEW OF SYSTEMS (negative unless checked):   Cardiac:  []  Chest pain or chest pressure? []  Shortness of breath upon activity? []  Shortness of breath when lying flat? []  Irregular heart rhythm?  Vascular:  []  Pain in calf, thigh, or hip brought on by walking? []  Pain in feet at night that wakes you up from your sleep? []  Blood clot in your veins? []  Leg swelling?  Pulmonary:  []  Oxygen at home? []  Productive cough? []  Wheezing?  Neurologic:  []  Sudden weakness in arms or legs? []  Sudden numbness in arms or legs? []  Sudden onset of difficult speaking or slurred speech? []  Temporary loss of vision in one eye? []  Problems with dizziness?  Gastrointestinal:  []  Blood in stool? []  Vomited blood?  Genitourinary:  []  Burning when urinating? []  Blood in urine?  Psychiatric:  []  Major depression  Hematologic:  []  Bleeding problems? []  Problems with blood clotting?  Dermatologic:  []  Rashes or ulcers?  Constitutional:  []  Fever or chills?  Ear/Nose/Throat:  []  Change in hearing? []  Nose bleeds? []  Sore throat?  Musculoskeletal:  []  Back pain? []  Joint pain? []  Muscle pain?   Physical Examination  *** Vitals:   04/21/22 1306 04/21/22 1310  BP: (!) 144/74 (!) 146/74  Pulse: (!) 50 (!) 50  Resp: 16  Temp: 97.8 F (36.6 C)   TempSrc: Temporal   SpO2: 96%   Weight: 181 lb (82.1 kg)   Height: 5\' 6"  (1.676 m)    ***Body mass index is 29.21 kg/m.  General:  WDWN in NAD; vital signs documented above Gait: Not observed HENT: WNL, normocephalic Pulmonary: normal non-labored breathing , without Rales, rhonchi,  wheezing Cardiac: {Desc; regular/irreg:14544} HR, without  Murmurs {With/Without:20273} carotid bruit*** Abdomen: soft, NT, no masses Skin: {With/Without:20273} rashes Vascular Exam/Pulses:  Right Left  Radial {Exam; arterial pulse strength 0-4:30167} {Exam; arterial pulse strength 0-4:30167}  Ulnar {Exam;  arterial pulse strength 0-4:30167} {Exam; arterial pulse strength 0-4:30167}  Femoral {Exam; arterial pulse strength 0-4:30167} {Exam; arterial pulse strength 0-4:30167}  Popliteal {Exam; arterial pulse strength 0-4:30167} {Exam; arterial pulse strength 0-4:30167}  DP {Exam; arterial pulse strength 0-4:30167} {Exam; arterial pulse strength 0-4:30167}  PT {Exam; arterial pulse strength 0-4:30167} {Exam; arterial pulse strength 0-4:30167}   Extremities: {With/Without:20273} ischemic changes, {With/Without:20273} Gangrene , {With/Without:20273} cellulitis; {With/Without:20273} open wounds;  Musculoskeletal: no muscle wasting or atrophy  Neurologic: A&O X 3;  No focal weakness or paresthesias are detected Psychiatric:  The pt has {Desc; normal/abnormal:11317::"Normal"} affect.  Non-Invasive Vascular Imaging   B Carotid Duplex (***):  R ICA stenosis:  {Stenosis:19197::"Occluded","80-99%","60-79%","40-59%","1-39%"} R VA: *** patent and antegrade L ICA stenosis:  {Stenosis:19197::"Occluded","80-99%","60-79%","40-59%","1-39%"} L VA: *** patent and antegrade   Medical Decision Making   Dillon [name] is a 89 y.o. male who presents with: ***sx ICA stenosis ***%.  Based on the patient's vascular studies and examination, I have offered the patient: ***. I discussed in depth with the patient the nature of atherosclerosis, and emphasized the importance of maximal medical management including strict control of blood pressure, blood glucose, and lipid levels, antiplatelet agents, obtaining regular exercise, and cessation of smoking.   The patient is aware that without maximal medical management the underlying atherosclerotic disease process will progress, limiting the benefit of any interventions. The patient is currently {Statin:19197::"not on on statin as not medically indicated.","not on a statin due to reported allergy.","not on a statin. Patient will be started on Lipitor 10 mg PO daily, to be titrated  and managed by their primary physician.","on a statin: Mevacor.","on a statin: Zocor.","on a statin: Lipitor.","on a statin: Pravachol.","on a statin: Crestor.","on a statin: ***."}  The patient is currently {Antiplatelet:19197::"not on an anti-platelet due to allergy.","not on an anti-platelet due to bleeding risks related to use of an anticoagulant.","not on an anti-platelet. Patient will be started on ASA 81 mg PO daily","on an anti-platelet: ASA.","on an anti-platelet: Plavix.","on an anti-platelet: ASA and Plavix.","on an anti-platelet: ***."} Thank you for allowing [place] to participate in this patient's care.   Dagoberto Ligas PA-C [facility] Office: [phone]  Clinic MD: ***

## 2022-05-08 ENCOUNTER — Other Ambulatory Visit: Payer: Self-pay | Admitting: Cardiovascular Disease

## 2022-06-02 ENCOUNTER — Encounter: Payer: Self-pay | Admitting: Cardiovascular Disease

## 2022-06-24 ENCOUNTER — Other Ambulatory Visit: Payer: Self-pay

## 2022-06-24 MED ORDER — ROSUVASTATIN CALCIUM 40 MG PO TABS: 40.0000 mg | ORAL_TABLET | Freq: Every day | ORAL | 3 refills | Status: DC

## 2022-08-05 DIAGNOSIS — N1832 Chronic kidney disease, stage 3b: Secondary | ICD-10-CM | POA: Insufficient documentation

## 2022-08-05 DIAGNOSIS — E1122 Type 2 diabetes mellitus with diabetic chronic kidney disease: Secondary | ICD-10-CM | POA: Insufficient documentation

## 2022-08-12 NOTE — Progress Notes (Signed)
[facility]   MULTIDISCIPLINARY HEART VALVE CLINIC                                     Cardiology Office Note:    Date:  08/14/2022   ID:  Dillon [name], DOB 1933/03/29, MRN [id number]  PCP:  Kerin Salen, PA-C  [facility] Cardiologist:  Nicki Guadalajara, MD  [facility] Electrophysiologist:  None   Referring MD: Kathaleen Bury*   1 year s/p TAVR  History of Present Illness:    Dillon [name] is a 89 y.o. male with a hx of CAD s/p CABG x3V (2009 by [facility]) and left main PCI on 06/05/21, CKD stage IIIb, empyema s/p VATS/decortication (2017), carotid stenosis s/p R CEA (2018), HTN, mixed hyperlipidemia and severe aortic stenosis s/p TAVR (08/13/21) who presents to clinic for follow up.   He has a history of CAD s/p CABG (LIMA to the LAD, saphenous vein graft to the obtuse marginal, and saphenous vein graft to the RCA) in 2009. In 2017 he underwent a left VATS/decortication for empyema by Dr. Maren Beach.  He then underwent a right carotid endarterectomy by Dr. Myra Gianotti in 2018. He had a history of moderate aortic stenosis that progressed to severe by echo in 02/2021 which showed EF 60% with an increase in the mean gradient to 53 mmHg. [facility] 05/14/2021 showed 95% eccentric ostial left main stenosis with an atretic LIMA to the distal LAD. There was ostial occlusion of the RCA.  There was a patent vein graft supplying the left circumflex but had 40 to 50% proximal stenosis and a patent vein graft supplying the RCA with filling of the RCA retrograde to the ostium. He subsequently underwent PCI of the ostial left main stenosis with a DES on 06/05/2021. He did not appreciate an improvement in symptoms after PCI. He underwent successful TAVR with a 23 mm Edwards Sapien 3 Ultra Resilia THV via the TF approach on 08/13/21. Post operative echo showed EF 60%, moderate asymmetric LVH, normally functioning TAVR with a mean gradient of 12 mmHg and trivial PVL. Given new LBBB, a Zio AT was  placed and unremarkable. He was discharged on continued DAPT with aspirin and Plavix. 1 month echo showed EF 55%, normally functioning TAVR with a mean gradient of 20 mm hg and trivial PVL as well as mild to mod MR with moderate MAC. His mean gradient was a little more elevated than previous (12 on POD1 echo). Plan was for a 6 month echo but this was never completed.    Today the patient presents to clinic for follow up. Here alone. No CP or SOB. Has some mild LE edema that is stable on lasix and compression stockings. No orthopnea or PND. No dizziness or syncope. No blood in stool or urine. No palpitations.    Past Medical History:  Diagnosis Date   CAD (coronary artery disease)    History of hiatal hernia    Hypertension    Hypothyroidism    Pneumonia 12/2015   hx   S/P CABG x 3 12/17/2007   LIMA to LAD,SVG to left C   S/P TAVR (transcatheter aortic valve replacement) 08/13/2021   s/p TAVR with a 23 mm Edwards S3UR via the TF approach by Dr. Excell Seltzer & Dr. Laneta Simmers   Severe aortic stenosis    s/p TAVR    Past Surgical History:  Procedure Laterality Date  CORONARY ARTERY BYPASS GRAFT  12/17/07   LIMA to LAD,vein to obtuse marginal,vein to RCA   CORONARY STENT INTERVENTION N/A 06/05/2021   Procedure: CORONARY STENT INTERVENTION;  Surgeon: Tonny Bollman, MD;  Location: [facility] INVASIVE CV LAB;  Service: Cardiovascular;  Laterality: N/A;   EMPYEMA DRAINAGE Left 01/11/2016   Procedure: EMPYEMA DRAINAGE;  Surgeon: Kerin Perna, MD;  Location: [facility] OR;  Service: Thoracic;  Laterality: Left;   ENDARTERECTOMY Right 03/26/2016   Procedure: RIGHT CAROTID ENDARTERECTOMY;  Surgeon: Nada Libman, MD;  Location: [facility] OR;  Service: Vascular;  Laterality: Right;   INTRAOPERATIVE TRANSTHORACIC ECHOCARDIOGRAM N/A 08/13/2021   Procedure: INTRAOPERATIVE TRANSTHORACIC ECHOCARDIOGRAM;  Surgeon: Tonny Bollman, MD;  Location: [facility] OR;  Service: Open Heart Surgery;  Laterality: N/A;   PATCH ANGIOPLASTY Right  03/26/2016   Procedure: PATCH ANGIOPLASTY USING Livia Snellen BIOLOGIC PATCH;  Surgeon: Nada Libman, MD;  Location: [facility] OR;  Service: Vascular;  Laterality: Right;   RIGHT HEART CATH AND CORONARY/GRAFT ANGIOGRAPHY N/A 05/14/2021   Procedure: RIGHT HEART CATH AND CORONARY/GRAFT ANGIOGRAPHY;  Surgeon: Lennette Bihari, MD;  Location: [facility] INVASIVE CV LAB;  Service: Cardiovascular;  Laterality: N/A;   TRANSCATHETER AORTIC VALVE REPLACEMENT, TRANSFEMORAL N/A 08/13/2021   Procedure: Transcatheter Aortic Valve Replacement, Transfemoral;  Surgeon: Tonny Bollman, MD;  Location: [facility] OR;  Service: Open Heart Surgery;  Laterality: N/A;   ULTRASOUND GUIDANCE FOR VASCULAR ACCESS Bilateral 08/13/2021   Procedure: ULTRASOUND GUIDANCE FOR VASCULAR ACCESS, BILATERAL FEMORAL ARTERIES AND LEFT FEMORAL VEIN;  Surgeon: Tonny Bollman, MD;  Location: [facility] OR;  Service: Open Heart Surgery;  Laterality: Bilateral;   VIDEO ASSISTED THORACOSCOPY (VATS)/DECORTICATION Left 01/11/2016   Procedure: VIDEO ASSISTED THORACOSCOPY (VATS)/DECORTICATION;  Surgeon: Kerin Perna, MD;  Location: [facility] OR;  Service: Thoracic;  Laterality: Left;    Current Medications: Current Meds  Medication Sig   amLODipine (NORVASC) 10 MG tablet Take 1 tablet (10 mg total) by mouth daily.   aspirin EC 81 MG tablet Take 81 mg by mouth at bedtime.    Cholecalciferol (VITAMIN D) 2000 units tablet Take 2,000 Units by mouth at bedtime.   clopidogrel (PLAVIX) 75 MG tablet Take 1 tablet by mouth daily (please keep scheduled appointment)   Cyanocobalamin (B-12) 2000 MCG TABS Take 2,000 mcg by mouth daily.   fexofenadine (ALLEGRA) 180 MG tablet Take 180 mg by mouth daily.   folic acid (FOLVITE) 1 MG tablet Take 1 tablet by mouth daily   furosemide (LASIX) 20 MG tablet Take 1 tablet (20 mg total) by mouth daily.   levothyroxine (SYNTHROID, LEVOTHROID) 112 MCG tablet Take 125 mcg by mouth daily before breakfast.   metoprolol succinate (TOPROL-XL) 50 MG 24 hr tablet  Take 1 tablet by mouth daily with or immediately following a meal   mupirocin cream (BACTROBAN) 2 % Apply 1 application topically daily as needed (after skin treatments).    Omega-3 1400 MG CAPS Take 1,400 mg by mouth daily.   rosuvastatin (CRESTOR) 40 MG tablet Take 1 tablet (40 mg total) by mouth daily.   terbinafine (LAMISIL) 1 % cream Apply 1 application topically daily as needed (rash).      Allergies:   Patient has no known allergies.   Social History   Socioeconomic History   Marital status: Married    Spouse name: Not on file   Number of children: 0   Years of education: Not on file   Highest education level: Not on file  Occupational History   Not on file  Tobacco  Use   Smoking status: Former    Current packs/day: 0.00    Types: Cigarettes    Quit date: 01/28/1971    Years since quitting: 51.5    Passive exposure: Never   Smokeless tobacco: Never  Vaping Use   Vaping status: Never Used  Substance and Sexual Activity   Alcohol use: Yes    Alcohol/week: 14.0 standard drinks of alcohol    Types: 14 Standard drinks or equivalent per week    Comment: 1-2 drinks per day (mixture of different types)   Drug use: No   Sexual activity: Not on file  Other Topics Concern   Not on file  Social History Narrative   Not on file   Social Determinants of Health   Financial Resource Strain: Not on file  Food Insecurity: Low Risk  (07/28/2022)   Received from [facility]   Food vital sign    Within the past 12 months, you worried that your food would run out before you got money to buy more: Never true    Within the past 12 months, the food you bought just didn't last and you didn't have money to get more. : Never true  Transportation Needs: Not on file (07/28/2022)  Physical Activity: Not on file  Stress: Not on file  Social Connections: Not on file     Family History: The patient's family history includes Heart attack in his father.  ROS:   Please see the history of  present illness.    All other systems reviewed and are negative.  EKGs/Labs/Other Studies Reviewed:    Cardiac Studies & Procedures   CARDIAC CATHETERIZATION  CARDIAC CATHETERIZATION 06/05/2021  Narrative Successful IVUS guided PCI of severe 90% stenosis at the ostium of the left mainstem, treated with a 4.5 x 12 mm Synergy DES.  Recommend: Overnight observation and hydration, repeat metabolic panel tomorrow morning, continue with plans for TAVR for treatment of severe symptomatic aortic stenosis.  Findings Coronary Findings Diagnostic  Dominance: Right  Left Main Ost LM lesion is 95% stenosed.  Left Circumflex Ost Cx to Prox Cx lesion is 50% stenosed.  Right Coronary Artery Ost RCA lesion is 100% stenosed.  Graft To Dist RCA Prox Graft-1 lesion is 65% stenosed. Prox Graft-2 lesion is 50% stenosed.  Graft To Mid Cx Prox Graft lesion is 45% stenosed.  LIMA Graft To Mid LAD And is small.  Intervention  Ost LM lesion Stent CATH VISTA GUIDE 6FR XBLAD3.5 guide catheter was inserted. Lesion crossed with guidewire using a WIRE COUGAR XT STRL 190CM. Pre-stent angioplasty was performed using a BALLN EUPHORA RX 4.0X12. A drug-eluting stent was successfully placed using a SYNERGY XD 4.50X12. Post-stent angioplasty was performed using a BALL SAPPHIRE NC24 4.5X10. Maximum pressure:  20 atm. Initially, intravascular ultrasound was performed via mechanical pullback.  This demonstrates severe stenosis over a focal region at the true ostium of the left main (aorto ostial lesion).  There is about 270 degree arc of calcium, but at least 90 degrees of this does not appear dense.  I elected to proceed with balloon predilatation using a 4.0 mm balloon which is expanded well at nominal pressure of 8 atm.  The left main ostium is then stented with caution taken to fully cover the ostium but not leave the stent hanging back too far into the aorta.  A 4.5 x 12 mm Synergy DES is utilized.  The stent is  deployed at 16 atm and then postdilated with a 4.5 mm noncompliant  balloon to 20 atm.  Intravascular ultrasound is performed and confirms full lesion coverage with good stent expansion throughout.  There is mild underexpansion at the lesion site but this area is redilated to high-pressure with a noncompliant balloon for better expansion.  The patient tolerated the procedure well with no immediate complication. Post-Intervention Lesion Assessment The intervention was successful. Pre-interventional TIMI flow is 3. Post-intervention TIMI flow is 3. No complications occurred at this lesion. Ultrasound (IVUS) was performed on the lesion post PCI using a CATH OPTICROSS HD. Stent well apposed. Severe plaque burden detected. Lesion characteristics:  calcified. There is a 0% residual stenosis post intervention.   CARDIAC CATHETERIZATION  CARDIAC CATHETERIZATION 05/14/2021  Narrative   Ost LM lesion is 95% stenosed.   Ost RCA lesion is 100% stenosed.   Prox Graft-1 lesion is 65% stenosed.   Prox Graft-2 lesion is 50% stenosed.   Prox Graft lesion is 45% stenosed.   Ost Cx to Prox Cx lesion is 50% stenosed.   and is small.  Very mildly elevated right heart pressures.  Severely calcified aortic valve with echocardiographic documentation of a mean gradient of 53 mmHg, and peak gradient 83 mmHg.  Severe native CAD with 95% eccentric ostial left main stenosis; 40% first diagonal stenosis of the LAD; 50% proximal circumflex stenosis; and ostial occlusion of the RCA.  Atretic very small caliber LIMA graft supplying the mid distal LAD.  Patent vein graft supplying the left circumflex vessel with 40 - 50% proximal third stenosis.  Patent vein graft supplying the RCA with filling of the RCA retrograde to the ostium.  There is a valve in the body of the graft with narrowing of 65 proximal and 50% distal to the probable valve.  RECOMMENDATION: The patient has a scheduled appointment to see Dr. Evelene Croon  as part of the structural heart team evaluation.  He  will undergo follow-up CT imaging per TAVR protocol.  Ultimate decision regarding SAVR with revascularization versus TAVR and left main PCI per Drs. Bartle and [employer].  Findings Coronary Findings Diagnostic  Dominance: Right  Left Main Ost LM lesion is 95% stenosed.  Left Circumflex Ost Cx to Prox Cx lesion is 50% stenosed.  Right Coronary Artery Ost RCA lesion is 100% stenosed.  Graft To Dist RCA Prox Graft-1 lesion is 65% stenosed. Prox Graft-2 lesion is 50% stenosed.  Graft To Mid Cx Prox Graft lesion is 45% stenosed.  LIMA Graft To Mid LAD And is small.  Intervention  No interventions have been documented.   STRESS TESTS  MYOCARDIAL PERFUSION IMAGING 06/08/2014   ECHOCARDIOGRAM  ECHOCARDIOGRAM COMPLETE 08/13/2022  Narrative ECHOCARDIOGRAM REPORT    Patient Name:   Dillon [name]   Date of Exam: 08/13/2022 Medical Rec #:  [id number]     Height:       66.0 in Accession #:    [id number]    Weight:       181.0 lb Date of Birth:  1933/07/16      BSA:          1.917 m Patient Age:    89 years      BP:           132/74 mmHg Patient Gender: M             HR:           57 bpm. Exam Location:  [address]  Procedure: 2D Echo, Cardiac Doppler and Color Doppler  Indications:    Z95.2 S/p TAVR  History:        Patient has prior history of Echocardiogram examinations, most recent 09/11/2021. CAD, Prior CABG, S/p TAVR (23mm Edwards Sapien 3UR), Arrythmias:LBBB; Risk Factors:Hypertension.  Sonographer:    Samule Ohm RDCS Referring Phys: [id number] Makaio Mach R Haston Casebolt  IMPRESSIONS   1. Left ventricular ejection fraction, by estimation, is 55 to 60%. The left ventricle has normal function. The left ventricle has no regional wall motion abnormalities. There is moderate concentric left ventricular hypertrophy. Left ventricular diastolic parameters are consistent with Grade II diastolic dysfunction  (pseudonormalization). 2. Peak RV-RA gradient 27 mmHg. Right ventricular systolic function is normal. The right ventricular size is normal. 3. Left atrial size was mild to moderately dilated. 4. Right atrial size was mild to moderately dilated. 5. The mitral valve is normal in structure. Trivial mitral valve regurgitation. No evidence of mitral stenosis. Moderate mitral annular calcification. 6. S/p TAVR with 23 mm Edwards Sapien THV. Mean gradient 12 mmHg with EOA 1.59 cm^2. Trivial peri-valvular leakage. Normally functioning TAVR valve.  FINDINGS Left Ventricle: Left ventricular ejection fraction, by estimation, is 55 to 60%. The left ventricle has normal function. The left ventricle has no regional wall motion abnormalities. The left ventricular internal cavity size was normal in size. There is moderate concentric left ventricular hypertrophy. Left ventricular diastolic parameters are consistent with Grade II diastolic dysfunction (pseudonormalization).  Right Ventricle: Peak RV-RA gradient 27 mmHg. The right ventricular size is normal. No increase in right ventricular wall thickness. Right ventricular systolic function is normal.  Left Atrium: Left atrial size was mild to moderately dilated.  Right Atrium: Right atrial size was mild to moderately dilated.  Pericardium: There is no evidence of pericardial effusion.  Mitral Valve: The mitral valve is normal in structure. Moderate mitral annular calcification. Trivial mitral valve regurgitation. No evidence of mitral valve stenosis.  Tricuspid Valve: The tricuspid valve is normal in structure. Tricuspid valve regurgitation is trivial.  Aortic Valve: S/p TAVR with 23 mm Edwards Sapien THV. Mean gradient 12 mmHg with EOA 1.59 cm^2. Trivial peri-valvular leakage. Normally functioning TAVR valve. The aortic valve has been repaired/replaced. Aortic valve regurgitation is trivial. Aortic valve mean gradient measures 8.0 mmHg. Aortic valve peak  gradient measures 23.5 mmHg. Aortic valve area, by VTI measures 1.59 cm.  Pulmonic Valve: The pulmonic valve was normal in structure. Pulmonic valve regurgitation is not visualized.  Aorta: The aortic root is normal in size and structure.  Venous: The inferior vena cava was not well visualized.  IAS/Shunts: No atrial level shunt detected by color flow Doppler.   LEFT VENTRICLE PLAX 2D LVIDd:         4.60 cm LVIDs:         3.20 cm LV PW:         1.20 cm LV IVS:        1.50 cm LVOT diam:     2.00 cm LV SV:         87 LV SV Index:   46 LVOT Area:     3.14 cm   RIGHT VENTRICLE RV S prime:     12.20 cm/s TAPSE (M-mode): 2.1 cm RVSP:           29.6 mmHg  LEFT ATRIUM             Index        RIGHT ATRIUM           Index LA diam:        4.70 cm 2.45  cm/m   RA Pressure: 3.00 mmHg LA Vol (A2C):   44.8 ml 23.37 ml/m  RA Area:     24.70 cm LA Vol (A4C):   76.1 ml 39.70 ml/m  RA Volume:   75.80 ml  39.55 ml/m LA Biplane Vol: 59.8 ml 31.20 ml/m AORTIC VALVE AV Area (Vmax):    1.44 cm AV Area (Vmean):   1.55 cm AV Area (VTI):     1.59 cm AV Vmax:           242.20 cm/s AV Vmean:          157.600 cm/s AV VTI:            0.548 m AV Peak Grad:      23.5 mmHg AV Mean Grad:      8.0 mmHg LVOT Vmax:         111.20 cm/s LVOT Vmean:        77.860 cm/s LVOT VTI:          0.278 m LVOT/AV VTI ratio: 0.51  AORTA Ao Root diam: 3.00 cm Ao Asc diam:  3.70 cm  MITRAL VALVE                TRICUSPID VALVE MV Area (PHT): 2.88 cm     TR Peak grad:   26.6 mmHg MV Decel Time: 264 msec     TR Vmax:        258.00 cm/s MV E velocity: 129.00 cm/s  Estimated RAP:  3.00 mmHg MV A velocity: 122.80 cm/s  RVSP:           29.6 mmHg MV E/A ratio:  1.05 SHUNTS Systemic VTI:  0.28 m Systemic Diam: 2.00 cm  Dalton McleanMD Electronically signed by Wilfred Lacy Signature Date/Time: 08/13/2022/2:47:44 PM    Final    MONITORS  LONG TERM MONITOR-LIVE TELEMETRY (3-14 DAYS)  09/03/2021  Narrative Patch Wear Time:  14 days and 0 hours (2023-07-19T10:09:28-0400 to 2023-08-02T10:09:28-0400)  Patient had a min HR of 25 bpm, max HR of 91 bpm, and avg HR of 59 bpm. Predominant underlying rhythm was Sinus Rhythm. First Degree AV Block was present. Second Degree AV Block-Mobitz I (Wenckebach) was present. Isolated SVEs were frequent (8.4%, 97219), SVE Couplets were rare (<1.0%, 251), and no SVE Triplets were present. Isolated VEs were occasional (1.7%, 19665), VE Couplets were rare (<1.0%, 1445), and no VE Triplets were present.  The predominant rhythm was sinus rhythm at an average rate of 59 bpm with a range of 45 to 91 bpm.  There was very rare ectopy.  No episodes of atrial fibrillation or prolonged pauses were demonstrated.   CT SCANS  CT CORONARY MORPH W/CTA COR W/SCORE 06/25/2021  Addendum 06/25/2021 11:20 PM ADDENDUM REPORT: 06/25/2021 23:17  CLINICAL DATA:  57 -year-old male with severe aortic stenosis being evaluated for a TAVR procedure.  EXAM: Cardiac TAVR CT  TECHNIQUE: The patient was scanned on a [employer]. A 120 kV retrospective scan was triggered in the descending thoracic aorta at 111 HU's. Gantry rotation speed was 250 msecs and collimation was .6 mm. No beta blockade or nitro were given. The 3D data set was reconstructed in 5% intervals of the R-R cycle. Systolic and diastolic phases were analyzed on a dedicated work station using MPR, MIP and VRT modes. The patient received 80 cc of contrast.  FINDINGS: Aortic Root:  Aortic valve: Tricuspid  Aortic valve calcium score: 2081  Aortic annulus:  Diameter: 25mm x 19mm  Perimeter: 68mm  Area: 344 mm^2  Calcifications: No calcifications  Coronary height: Min Left - 11mm, Max Left - 17mm; Min Right - 12mm  Sinotubular height: Left cusp - 22mm; Right cusp - 18mm; Noncoronary cusp - 22mm  LVOT (as measured 3 mm below the annulus):  Diameter: 26mm x 16mm  Area: 303  mm^2  Calcifications: No calcifications  Aortic sinus width: Left cusp - 32mm; Right cusp - 32mm; Noncoronary cusp - 33mm  Sinotubular junction width: 28mm x 27mm  Optimum Fluoroscopic Angle for Delivery: LAO 27 CRA 8  Cardiac:  Right atrium: Mild enlargement  Right ventricle: Normal size  Pulmonary arteries: Normal size  Pulmonary veins: Normal configuration  Left atrium: Mild enlargement  Left ventricle: Mild dilatation  Pericardium: Normal thickness  Coronary arteries: S/p left main stent. Atretic LIMA to LAD. Patent SVG to LCX and SVG to RCA  IMPRESSION: 1. Tricuspid aortic valve with severe calcifications (AV calcium score 2081)  2. Aortic annulus measures 25mm x 19mm in diameter with perimeter 68mm and area 344 mm^2. No annular or LVOT calcifications. Annular measurement suitable for delivery of 23mm Edwards Sapien 3 valve  3. Low coronary heights, measuring 11mm to left main and 12mm to RCA  4. Optimum Fluoroscopic Angle for Delivery:  LAO 27 CRA 8   Electronically Signed By: Epifanio Lesches M.D. On: 06/25/2021 23:17  Narrative EXAM: OVER-READ INTERPRETATION  CT CHEST  The following report is a limited chest CT over-read performed by radiologist Dr. Allegra Lai of [facility] on 06/25/2021. This over-read does not include interpretation of cardiac or coronary anatomy or pathology. The cardiac CTA interpretation by the cardiologist is attached.  COMPARISON:  None Available.  FINDINGS: Extracardiac findings will be described separately under dictation for contemporaneously obtained CTA chest, abdomen and pelvis.  IMPRESSION: Please see separate dictation for contemporaneously obtained CTA chest, abdomen and pelvis dated 06/25/2021 for full description of relevant extracardiac findings.  Electronically Signed: By: Allegra Lai M.D. On: 06/25/2021 13:22          EKG:  EKG is NOT ordered today.   Recent Labs: No  results found for requested labs within last 365 days.  Recent Lipid Panel    Component Value Date/Time   CHOL 145 01/18/2014 0803   CHOL 139 08/30/2012 0845   TRIG 70 01/18/2014 0803   TRIG 82 08/30/2012 0845   HDL 64 01/18/2014 0803   HDL 68 08/30/2012 0845   CHOLHDL 2.3 01/18/2014 0803   VLDL 14 01/18/2014 0803   LDLCALC 67 01/18/2014 0803   LDLCALC 55 08/30/2012 0845     Risk Assessment/Calculations:      Physical Exam:    VS:  BP 124/76   Pulse (!) 58   Ht 5\' 6"  (1.676 m)   Wt 182 lb 9.6 oz (82.8 kg)   SpO2 98%   BMI 29.47 kg/m     Wt Readings from Last 3 Encounters:  08/13/22 182 lb 9.6 oz (82.8 kg)  04/21/22 181 lb (82.1 kg)  04/07/22 184 lb 6.4 oz (83.6 kg)     GEN: Well nourished, well developed in no acute distress HEENT: Normal NECK: No JVD LYMPHATICS: No lymphadenopathy CARDIAC: RRR, no murmurs, rubs, gallops RESPIRATORY:  Clear to auscultation without rales, wheezing or rhonchi  ABDOMEN: Soft, non-tender, non-distended MUSCULOSKELETAL:  No edema; No deformity  SKIN: Warm and dry NEUROLOGIC:  Alert and oriented x 3 PSYCHIATRIC:  Normal affect   ASSESSMENT:    1. S/P TAVR (transcatheter aortic valve  replacement)   2. RBBB   3. Primary hypertension   4. CAD in native artery   5. Stage 3b chronic kidney disease (HCC)   6. Hyperlipidemia with target LDL less than 70    PLAN:    In order of problems listed above:  Severe AS s/p TAVR: echo today shows EF 55%, normally functioning TAVR with a mean gradient of 12 mm hg and trivial PVL as well as moderate MAC. He is doing quite well with NYHA class I symptoms. SBE prophylaxis discussed; he gets Abx from the dentist. Continue chronic DAPT with Asprin and Plavix given extensive PAD/CAD hx.    LBBB: Zio AT with no HAVB   HTN: BP better controlled on increase Norvasc 10mg  daily. Continue Toprol XL 50mg  daily. No ACE/ARB given advanced CKD.   CAD s/p CABG: s/p PCI of the ostial left main stenosis with  a DES on 06/05/2021. Continue DAPT and high intensity statin.    CKD stage IIIb: creat ~1.7.  He follows with a nephrologist.    HLD: continue statin.   Medication Adjustments/Labs and Tests Ordered: Current medicines are reviewed at length with the patient today.  Concerns regarding medicines are outlined above.  No orders of the defined types were placed in this encounter.  No orders of the defined types were placed in this encounter.   Patient Instructions  Medication Instructions:  No changes *If you need a refill on your cardiac medications before your next appointment, please call your pharmacy*   Lab Work: none If you have labs (blood work) drawn today and your tests are completely normal, you will receive your results only by: MyChart Message (if you have MyChart) OR A paper copy in the mail If you have any lab test that is abnormal or we need to change your treatment, we will call you to review the results.   Testing/Procedures: none   Follow-Up: With Dr. Tresa Endo as planned   Signed, Cline Crock, PA-C  08/14/2022 8:29 AM    [facility]

## 2022-08-13 ENCOUNTER — Ambulatory Visit (HOSPITAL_BASED_OUTPATIENT_CLINIC_OR_DEPARTMENT_OTHER): Payer: PPO

## 2022-08-13 ENCOUNTER — Other Ambulatory Visit (HOSPITAL_COMMUNITY): Payer: PPO

## 2022-08-13 ENCOUNTER — Other Ambulatory Visit: Payer: Self-pay | Admitting: Physician Assistant

## 2022-08-13 ENCOUNTER — Ambulatory Visit: Payer: PPO | Attending: Internal Medicine | Admitting: Physician Assistant

## 2022-08-13 VITALS — BP 124/76 | HR 58 | Ht 66.0 in | Wt 182.6 lb

## 2022-08-13 DIAGNOSIS — Z952 Presence of prosthetic heart valve: Secondary | ICD-10-CM

## 2022-08-13 DIAGNOSIS — E785 Hyperlipidemia, unspecified: Secondary | ICD-10-CM | POA: Diagnosis present

## 2022-08-13 DIAGNOSIS — I10 Essential (primary) hypertension: Secondary | ICD-10-CM | POA: Insufficient documentation

## 2022-08-13 DIAGNOSIS — I4510 Unspecified right bundle-branch block: Secondary | ICD-10-CM

## 2022-08-13 DIAGNOSIS — I2510 Atherosclerotic heart disease of native coronary artery without angina pectoris: Secondary | ICD-10-CM | POA: Diagnosis not present

## 2022-08-13 DIAGNOSIS — N1832 Chronic kidney disease, stage 3b: Secondary | ICD-10-CM | POA: Diagnosis present

## 2022-08-13 LAB — ECHOCARDIOGRAM COMPLETE
AR max vel: 1.44 cm2
AV Area VTI: 1.59 cm2
AV Area mean vel: 1.55 cm2
AV Mean grad: 8 mmHg
AV Peak grad: 23.5 mmHg
Ao pk vel: 2.42 m/s
Area-P 1/2: 2.88 cm2
S' Lateral: 3.2 cm

## 2022-08-13 NOTE — Patient Instructions (Signed)
Medication Instructions:  No changes *If you need a refill on your cardiac medications before your next appointment, please call your pharmacy*   Lab Work: none If you have labs (blood work) drawn today and your tests are completely normal, you will receive your results only by: MyChart Message (if you have MyChart) OR A paper copy in the mail If you have any lab test that is abnormal or we need to change your treatment, we will call you to review the results.   Testing/Procedures: none   Follow-Up: With Dr. Tresa Endo as planned

## 2022-09-21 ENCOUNTER — Encounter: Payer: Self-pay | Admitting: Cardiovascular Disease

## 2022-09-22 MED ORDER — AMLODIPINE BESYLATE 10 MG PO TABS: 10.0000 mg | ORAL_TABLET | Freq: Every day | ORAL | 3 refills | Status: DC

## 2022-11-05 ENCOUNTER — Encounter: Payer: Self-pay | Admitting: Cardiovascular Disease

## 2022-11-05 ENCOUNTER — Ambulatory Visit: Payer: PPO | Attending: Cardiovascular Disease | Admitting: Cardiovascular Disease

## 2022-11-05 DIAGNOSIS — I350 Nonrheumatic aortic (valve) stenosis: Secondary | ICD-10-CM

## 2022-11-05 DIAGNOSIS — Z955 Presence of coronary angioplasty implant and graft: Secondary | ICD-10-CM

## 2022-11-05 DIAGNOSIS — Z952 Presence of prosthetic heart valve: Secondary | ICD-10-CM | POA: Diagnosis not present

## 2022-11-05 DIAGNOSIS — E039 Hypothyroidism, unspecified: Secondary | ICD-10-CM

## 2022-11-05 DIAGNOSIS — E782 Mixed hyperlipidemia: Secondary | ICD-10-CM

## 2022-11-05 DIAGNOSIS — N1832 Chronic kidney disease, stage 3b: Secondary | ICD-10-CM

## 2022-11-05 DIAGNOSIS — Z951 Presence of aortocoronary bypass graft: Secondary | ICD-10-CM

## 2022-11-05 NOTE — Progress Notes (Signed)
Patient ID: Dillon [name], male   DOB: [birth year], 89 y.o.   MRN: [id number]       HPI: Dillon [name], is a 89 y.o. male who presents to the office today for a 7 month follow-up cardiology evaluation.  In November 2009 [name] underwent emergent CABG revascularization surgery after cardiac catheterization revealed severe life-threatening anatomy with 95% ostial left main stenosis a 99% ostial RCA stenosis. Surgery was done by Dr. Laneta Simmers and he had a LIMA to the LAD, vein to the obtuse marginal, vein to the RCA. His last nuclear perfusion study in April 2012 continued to show normal perfusion.  [name] has documented carotid disease. A carotid Doppler study  in November 2013 which showed at least 60% stenosis in his carotid arteries bilaterally which was slightly increased from previously. A f/u carotid evaluation last year demonstrated his peak right internal carotid systolic velocity 257 slightly increased from 240 in his left PICA systolic velocity up to 11 slightly increased from 200; 50-69% diameter reduction range bilaterally and was not significantly changed from one year ago.  On 12/01/2013 a follow-up study demonstrated a peak PICA velocity was now 285 with diastolic velocity at 42 and the right carotid and 218 and 61 in the left carotid.  He remains asymptomatic and these place him in the upper end of scale in the 50-69% range  Additional problems include mixed hyperlipidemia and hypertension.  He has been on Toprol-XL 50 mg and losartan 100 mg in addition to amlodipine 5 mg for blood pressure control.. In the past he had derived marked benefit with Niaspan  as well as [name]. Follow-up laboratory on his current dose of [name] and niacin 1000 mg  revealed a total cholesterol 145, triglycerides 70, HDL 64, and LDL 67.  His glucose was 111.  TSH 4.5.  He had normal renal function with a BUN of 19 and creatinine of 1.1.  He underwent echo Doppler study on 06/06/2014.  This showed  an ejection fraction at 55-60%.  There was a small systolic gradient across his aortic valve with moderately calcified leaflets.  Valve area was 1.6 cm.  He had a mean gradient of 11 and a peak gradient of 23 mm suggestive of mild aortic stenosis.  PA pressure was 31 mm.  A nuclear perfusion study which remained normal with an ejection fraction of 59% and evidence for normal perfusion.  He underwent a F/U 2-D echo Doppler study on 08/20/2015 which showed an EF of 60-65%.  The aortic valve was calcified and thickened with mildly restricted motion.  Peak and mean gradients were 25 and 14 mm consistent with mild aortic stenosis with a valve area of 1.42 cm.  I scheduled him for follow-up carotid duplex exam which was done in July 2017.  This suggested progression of his right internal carotid stenoses with velocity now at 465/131, which places him in the greater than 80% range.  He had stable left internal carotid velocities now or in the 60-79% range.  He had normal subclavian arteries bilaterally, and patent vertebral arteries with antegrade flow.  I referred him to Dr. Allyson Dillon [name] who felt that with his asymptomatic status and low risk assessment that he should undergo carotid endarterectomy.  He was referred to Dr. Myra [name] for an office evaluation but this has not yet been scheduled.  He was hospitalized in December 2017 with community-acquired pneumonia of the left lower lung and possible empyema.  He underwent thoracentesis as well as a  VATS procedure with decortication and left empyema drainage.  His carotid surgery with Dr. Myra [name] was ultimately postponed but was successfully done on 03/26/2016 with right carotid endarterectomy.  Intraoperative findings included a 95% stenosis.  He was discharged the following day.    I saw him in October 2018 at which time he was doing well without chest pain, PND, orthopnea. He denies paresthesias, presyncope or syncope.  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  He had his fifth hole in one of his career on a par 3.  He recently had blood work done in [place] by his primary physician.  Renal function was stable, although potassium was upper normal at 5.2.  Lipid studies revealed cholesterol 148, triglycerides 134, HDL 52, LDL 69.  Thyroid function studies were normal.  Hemoglobin A1c was elevated at 6.4 and his estimated average glucose was 137.  He underwent a follow-up echo Doppler study in 11/04/2016.  This continued to show hyperdynamic LV function with an EF of 65-70% with moderate LVH.  Wall motion was normal.  His aortic valve gradient have slightly increased over the year, such that his peak gradient increased from 25 to 38 mm, and his mean gradient from 14 mm to 20 mmHg. Aortic valve area was 1.57 cm placing him still in the mild to mild to moderate AS category.    He had follow-up carotid imaging with Dr. Myra [name] which showed a patent carotid endarterectomy site with no evidence for restenosis or hyperplasia.  Left internal carotid artery velocities suggested a 40 to 59% stenosis.  When I last saw him in September 2019 he denied any chest pain, presyncope or syncope or any symptoms of heart failure.  I reviewed recent laboratory done by his primary physician on March 04, 2017.  Chemistry was stable.  Total cholesterol was 125, triglycerides 73, HDL 49, and LDL 61.  TSH is 1.77.  Free T3 was 3.07 and free T4 1.0.  He was not anemic.  HbA1c 6.2  [name] underwent an evaluation by Joni Reining, NP in December 2019 after developing some mild irregularity to his heart rate was noted by his primary physician.  He was felt to have occasional to frequent PACs and had bigeminal PACs.  This ultimately resolved on its own.  On April 01, 2018 he underwent a follow-up echo Doppler study which continued to show normal LV function with an EF of 55 to 60%.  There was mitral annular calcification.  His aortic stenosis was in the moderate range with a  mean gradient now at 25 and a peak instantantaneous gradient at 41 with a valve area of 1.3 cm.  When that evaluation he remained asymptomatic and specifically denied chest pain, PND, orthopnea, presyncope or syncope.   I last saw him in September 2020 at which time he remained asymptomatic.  His wife had recently under gone knee surgery in August and they have been staying local instead of going down to [place].  He continues to deny any episodes of chest pain, change in exercise tolerance, palpitations, presyncope or syncope, or CHF symptomatology.  He is unaware of any palpitations.    Mr. Bayers underwent a 1 year follow-up echo Doppler study on April 18, 2019.  LV function remains normal with EF 60 to 65%.  There was grade 2 diastolic dysfunction and mild LVH.  He had moderately elevated pulmonary artery systolic pressure at 44 mm.  There was moderate aortic stenosis with a mean gradient of  24 and peak instantaneous gradient at 39 mmHg.  Aortic valve area was 1.04 cm.  There was mild aortic insufficiency.  When I saw him in April 2021 he remained asymptomatic and denied any chest pain or shortness of breath.  He denied any exertional dyspnea.   He had laboratory checked in February 2021 which showed a total cholesterol 138 HDL 57 LDL 62 and triglycerides 94.  During that evaluation I reviewed his echo Doppler with him in detail and his gradients had not significantly changed from prior evaluation.  I saw him in November 2021.  Since his prior evaluation he continued to do well. He was at [facility] in [place] and is beach, golf [place] house.  He played golf 6 times without chest pain or shortness of breath.  He states his heart rate typically runs in the 50s.  He denies any chest pain, dizziness or shortness of breath.  During that evaluation his blood pressure was mildly elevated and he was on amlodipine 5 mg, losartan 100 mg in addition to metoprolol succinate 50 mg.  He was mildly  bradycardic without symptoms.  In April 2022 I recommended follow-up echo Doppler study to reassess his aortic valve stenosis.  He was continuing DAPT therapy with aspirin/Plavix and was on levothyroxine for hypothyroidism.  He underwent follow-up carotid duplex imaging and was seen at [facility] by Clinton Gallant, PA-C.  He is status post right carotid endarterectomy in February 2015.  Carotid studies were reviewed which showed mild 1 to 39% stenoses bilaterally.  He had normal vertebral and subclavian flow.  He underwent an echo Doppler study on May 10, 2020.  This continued to show normal EF of 55 to 60%.  There was mild LVH.  There were no wall motion abnormalities.  He had severe mitral annular calcification without mitral stenosis.  His mean aortic valve gradient had increased to 40.4 mmHg with a peak gradient at 70.4.  Aortic valve area was 1.02 cm.  There was mild dilation of his ascending aorta at 36 mm..  I saw him May 2022.  At that time he remained completely asymptomatic. He was continuing to play golf regularly and denies any change in exercise capacity while playing golf.  However, if he attempts to walk very fast he does note some very mild shortness of breath.  He is unaware of palpitations.  His blood pressure has been stable.  He had undergone follow-up lipid studies on April 26, 2020 which showed an LDL at 59 total cholesterol 131 and triglycerides at 88 with HDL at 54.  His most recent creatinine had increased to 1.67.  During that evaluation I had an extensive discussion with him concerning his April 2022 echo Doppler findings regarding his aortic stenosis and I spent considerable time reviewing the natural history of aortic stenosis and potential progression.  I saw him on September 24, 2020 and since his prior evaluation he continued to be asymptomatic.  He continues to be symptom-free.  I had recommended he undergo a follow-up echo Doppler study which was done on September 06, 2020.  Ejection  fraction continues to be excellent at 60 to 65%.  There is mild LVH and mild grade 1 diastolic dysfunction with elevated left atrial pressure.  His mean aortic gradient has increased from 40 to 47 mm and his peak instantaneous gradient from 70 to 77 mm.  Estimated aortic valve area is 0.98 cm2.  During that evaluation, with his progressive aortic stenosis I recommended an initial evaluation  with Dr. Laneta Simmers since he had performed his CABG revascularization surgery in 2009 for consideration of potential future candidacy for TAVR.  He apparently was seen by Dr. Excell Seltzer on October 29, 2020.  He remained functionally independent who was playing golf several times per week, performing yard work and chores around his house without symptoms.  He was felt to have severe stage C aortic stenosis and after lengthy discussion with Dr. Excell Seltzer concerning likely future progression of his aortic stenosis he was counseled extensively to watch for symptoms of progressive fatigue, dyspnea, chest discomfort, lightheadedness or syncope.  He underwent a follow-up echo Doppler study on March 19, 2021 which continues to show normal LV function with mild LVH with EF 60 to 65%.  He again was found to have severe aortic stenosis and there was slight progression of his mean gradient 47 up to 53 mmHg with a peak gradient of 83.2 mmHg.  There was moderate mitral annular calcification.  I saw him on April 08, 2021.  At that time he felt well and denied any development of symptoms.  He had recently been evaluated at [facility] in follow-up of his carotid artery disease.  He is status post right CEA by Dr. Myra [name] in February 2018 and he remained asymptomatic with slight increase of the left ICA now in the 40 to 59% range with right ICA at 1 to 39%.  He denied chest pain PND orthopnea, presyncope or syncope or palpitations.  During that evaluation, with his progressive severe aortic stenosis I referred him to Dr. Excell Seltzer for structural heart  evaluation with possible TAVR.  He saw Dr. Excell Seltzer on April 25, 2021 who felt the patient had progressive, severe aortic stenosis, stage D1 with only very mild symptoms of fatigue and exertion dyspnea, New York Heart Association class II.  It was felt that with his advanced age and prior cardiac surgery TAVR would be the treatment of choice if his anatomy is conducive.  He was sent back to me to perform right and left heart catheterization which was done on May 14, 2021.  He was found to have very mildly elevated right heart pressures.  His aortic valve was severely calcified with a mean gradient of 53 and peak gradient of 83.  He had severe native CAD with 95% eccentric ostial left main stenosis, 40% first diagonal stenosis of the LAD, 50% proximal stenosis of the circumflex and ostial occlusion of the RCA.  He had an atretic very small caliber LIMA graft supplying the mid distal LAD.  The vein graft supplying the circumflex vessel was patent with 40 to 50% proximal third stenosis.  The vein graft supplying the RCA was patent with filling of the RCA retrograde to the ostium.  There was a valve in the body of the graft with narrowing of 65% proximally and 50% distal to the valve.  On Jun 05, 2021 he underwent successful IVUS guided PCI of severe 90% stenosis at the ostium of the left main stent treated with 4.5 x 12 mm Synergy DES stent.  He underwent successful TAVR with a 23 mm Edwards SAPIEN 3 ultra Resilia THV valve via the transfemoral approach on August 13, 2021.  Postoperative echo showed EF 60% with mild asymmetric LVH, normally functioning TAVR with a mean gradient of 12 and trivial PVL.  He developed a new left bundle branch block and he underwent ZIO monitoring.  He was seen by Cline Crock August 16 for follow-up.  Zio patch monitor showed an average  rate of 59 with a range of 45-91 with very rare ectopy with no episodes of A-fib or prolonged pauses.  Repeat echo now showed a mean gradient  of 20 mm across his bioprosthetic valve.  When I  saw him on September 03, 2021 he felt well and remained stable.  He was experiencing some mild  swelling of his legs he was unaware of any arrhythmia.  He denied presyncope or syncope.  I last saw him on April 07, 2022.  At that time he felt well.  His blood pressure was upper normal and he had tense 1+ lower extremity edema right greater than left leg.  I suggested he discontinue hydrochlorothiazide and initiate furosemide 20 mg daily.  He continues to be on amlodipine and metoprolol.  He continued to be on rosuvastatin 40 mg and omega-3 fatty acid for mixed hyperlipidemia.  He continued to be on DAPT with his left main ostial stent.  Since I last saw him, he underwent a follow-up echo Doppler study with follow-up office visit by Carlean Jews, [facility] on August 13, 2022.  His echo showed an EF of 55%.  There was grade 2 diastolic dysfunction.  He had a normally functioning TAVR valve with mean gradient of 12.  There was trivial perivalvular leak.  Peak gradient was 23.5.  There was moderate mitral annular calcification and moderate biatrial enlargement.  During her office evaluation he was felt to be clinically stable.  Presently, Mr. Arrendale feels well.  There is no chest pain or shortness of breath.  He walks regularly.  He continues to play golf 2 days/week etc. feels.  He has undergone lumbar surgery for squamous cell by Dr. Irene Limbo at [facility] dermatology.  Laboratory in July 2024 showed hemoglobin 12.2 hematocrit 35.7.  Creatinine was 1.73 with estimated GFR 38.  LDL cholesterol was 714 but triglycerides were elevated at 319 with total cholesterol 101.  He presents for evaluation.  Past Medical History:  Diagnosis Date   CAD (coronary artery disease)    History of hiatal hernia    Hypertension    Hypothyroidism    Pneumonia 12/2015   hx   S/P CABG x 3 12/17/2007   LIMA to LAD,SVG to left C   S/P TAVR (transcatheter aortic valve replacement)  08/13/2021   s/p TAVR with a 23 mm Edwards S3UR via the TF approach by Dr. Excell Seltzer & Dr. Laneta Simmers   Severe aortic stenosis    s/p TAVR    Past Surgical History:  Procedure Laterality Date   CORONARY ARTERY BYPASS GRAFT  12/17/07   LIMA to LAD,vein to obtuse marginal,vein to RCA   CORONARY STENT INTERVENTION N/A 06/05/2021   Procedure: CORONARY STENT INTERVENTION;  Surgeon: Tonny Bollman, MD;  Location: [facility] INVASIVE CV LAB;  Service: Cardiovascular;  Laterality: N/A;   EMPYEMA DRAINAGE Left 01/11/2016   Procedure: EMPYEMA DRAINAGE;  Surgeon: Kerin Perna, MD;  Location: [facility] OR;  Service: Thoracic;  Laterality: Left;   ENDARTERECTOMY Right 03/26/2016   Procedure: RIGHT CAROTID ENDARTERECTOMY;  Surgeon: Nada Libman, MD;  Location: [facility] OR;  Service: Vascular;  Laterality: Right;   INTRAOPERATIVE TRANSTHORACIC ECHOCARDIOGRAM N/A 08/13/2021   Procedure: INTRAOPERATIVE TRANSTHORACIC ECHOCARDIOGRAM;  Surgeon: Tonny Bollman, MD;  Location: [facility] OR;  Service: Open Heart Surgery;  Laterality: N/A;   PATCH ANGIOPLASTY Right 03/26/2016   Procedure: PATCH ANGIOPLASTY USING Livia Snellen BIOLOGIC PATCH;  Surgeon: Nada Libman, MD;  Location: [facility] OR;  Service: Vascular;  Laterality: Right;   RIGHT HEART CATH  AND CORONARY/GRAFT ANGIOGRAPHY N/A 05/14/2021   Procedure: RIGHT HEART CATH AND CORONARY/GRAFT ANGIOGRAPHY;  Surgeon: Lennette Bihari, MD;  Location: [facility] INVASIVE CV LAB;  Service: Cardiovascular;  Laterality: N/A;   TRANSCATHETER AORTIC VALVE REPLACEMENT, TRANSFEMORAL N/A 08/13/2021   Procedure: Transcatheter Aortic Valve Replacement, Transfemoral;  Surgeon: Tonny Bollman, MD;  Location: [facility] OR;  Service: Open Heart Surgery;  Laterality: N/A;   ULTRASOUND GUIDANCE FOR VASCULAR ACCESS Bilateral 08/13/2021   Procedure: ULTRASOUND GUIDANCE FOR VASCULAR ACCESS, BILATERAL FEMORAL ARTERIES AND LEFT FEMORAL VEIN;  Surgeon: Tonny Bollman, MD;  Location: [facility] OR;  Service: Open Heart Surgery;  Laterality: Bilateral;    VIDEO ASSISTED THORACOSCOPY (VATS)/DECORTICATION Left 01/11/2016   Procedure: VIDEO ASSISTED THORACOSCOPY (VATS)/DECORTICATION;  Surgeon: Kerin Perna, MD;  Location: [facility] OR;  Service: Thoracic;  Laterality: Left;    No Known Allergies  Current Outpatient Medications  Medication Sig Dispense Refill   amLODipine (NORVASC) 10 MG tablet Take 1 tablet (10 mg total) by mouth daily. 180 tablet 3   aspirin EC 81 MG tablet Take 81 mg by mouth at bedtime.      Cholecalciferol (VITAMIN D) 2000 units tablet Take 2,000 Units by mouth at bedtime.     clopidogrel (PLAVIX) 75 MG tablet Take 1 tablet by mouth daily (please keep scheduled appointment) 90 tablet 3   Cyanocobalamin (B-12) 2000 MCG TABS Take 2,000 mcg by mouth daily.     fexofenadine (ALLEGRA) 180 MG tablet Take 180 mg by mouth daily.     folic acid (FOLVITE) 1 MG tablet Take 1 tablet by mouth daily 90 tablet 3   furosemide (LASIX) 20 MG tablet Take 1 tablet (20 mg total) by mouth daily. 90 tablet 3   imiquimod (ALDARA) 5 % cream Apply 1 Application topically at bedtime.     levothyroxine (SYNTHROID, LEVOTHROID) 112 MCG tablet Take 125 mcg by mouth daily before breakfast.     metoprolol succinate (TOPROL-XL) 50 MG 24 hr tablet Take 1 tablet by mouth daily with or immediately following a meal 90 tablet 3   mupirocin cream (BACTROBAN) 2 % Apply 1 application topically daily as needed (after skin treatments).      mupirocin ointment (BACTROBAN) 2 % Apply 1 Application topically 2 (two) times daily.     Omega-3 1400 MG CAPS Take 1,400 mg by mouth daily.     rosuvastatin (CRESTOR) 40 MG tablet Take 1 tablet (40 mg total) by mouth daily. 90 tablet 3   terbinafine (LAMISIL) 1 % cream Apply 1 application topically daily as needed (rash).      No current facility-administered medications for this visit.    Socially he is married. There are no children. He  remains active and plays golf, yard work, and household chores.  He does walk. There is no  tobacco use. He does drink occasional alcohol.  ROS General: Negative; No fevers, chills, or night sweats;  HEENT: Negative; No changes in vision or hearing, sinus congestion, difficulty swallowing Pulmonary: Negative; No cough, wheezing, shortness of breath, hemoptysis Cardiovascular:  See HPI GI: Negative; No nausea, vomiting, diarrhea, or abdominal pain GU: Negative; No dysuria, hematuria, or difficulty voiding Musculoskeletal: Negative; no myalgias, joint pain, or weakness Hematologic/Oncology: Negative; no easy bruising, bleeding Endocrine: Positive for hypothyroidism on Synthroid replacement. Neuro: Negative; no changes in balance, headaches Skin: Negative; No rashes or skin lesions Psychiatric: Negative; No behavioral problems, depression Sleep: Negative; No snoring, daytime sleepiness, hypersomnolence, bruxism, restless legs, hypnogognic hallucinations, no cataplexy Other comprehensive 14 point system review  is negative.   PE BP 126/72   Pulse (!) 52   Ht 5' 6.5" (1.689 m)   Wt 181 lb 3.2 oz (82.2 kg)   SpO2 94%   BMI 28.81 kg/m    Repeat blood pressure by me was 128/78  Wt Readings from Last 3 Encounters:  11/05/22 181 lb 3.2 oz (82.2 kg)  08/13/22 182 lb 9.6 oz (82.8 kg)  04/21/22 181 lb (82.1 kg)     Physical Exam BP 126/72   Pulse (!) 52   Ht 5' 6.5" (1.689 m)   Wt 181 lb 3.2 oz (82.2 kg)   SpO2 94%   BMI 28.81 kg/m  General: Alert, oriented, no distress.  Skin: normal turgor, no rashes, warm and dry HEENT: Normocephalic, atraumatic. Pupils equal round and reactive to light; sclera anicteric; extraocular muscles intact;  Nose without nasal septal hypertrophy Mouth/Parynx benign; Mallinpatti scale 3 Neck: No JVD, no carotid bruits; normal carotid upstroke Lungs: clear to ausculatation and percussion; no wheezing or rales Chest wall: without tenderness to palpitation Heart: PMI not displaced, RRR, s1 s2 normal, 2/6 systolic murmur, no diastolic murmur,  no rubs, gallops, thrills, or heaves Abdomen: soft, nontender; no hepatosplenomehaly, BS+; abdominal aorta nontender and not dilated by palpation. Back: no CVA tenderness Pulses 2+ Musculoskeletal: full range of motion, normal strength, no joint deformities Extremities: no clubbing cyanosis or edema, Homan's sign negative  Neurologic: grossly nonfocal; Cranial nerves grossly wnl Psychologic: Normal mood and affect    April 07, 2022 ECG (independently read by me):  Sinus bradycardia at 57, Nonspecific IVCD  September 23, 2021 ECG (independently read by me): Sinus bradycardia at 53, 1st degree AV block   April 08, 2021 ECG (independently read by me):  Sinus bradycardia at 47, 1st degree AV block; 1st degree AV block, PR 222 msec  September 24, 2020 ECG (independently read by me): Sinus bradycardia at 51, 1 st degree AV block, PR 220 msec; QTc 401 msec  May 29, 2020 ECG (independently read by me): Sinus bradycardia 51 bpm, PACs, first-degree AV block with a PR 212 ms.  There is no evidence for LV strain or LVH.  November 2021 ECG (independently read by me): Sinus bradycardia at 48 bpm, first-degree AV block, PR 216 ms.  No ectopy.  No ECG changes.  QTc interval normal at 398 ms  April14, 2021 ECG (independently read by me): Sinus bradycardia at 54 bpm with mild sinus arrhythmia.  First-degree AV block with a PR interval at 214 ms.  No ectopy.  September 2020 ECG (independently read by me): Sinus bradycardia 54 bpm, isolated PAC, RV conduction delay/incomplete right bundle branch block.  PR interval 202 ms.  March 2020 ECG (independently read by me): Sinus rhythm at 60 bpm with mild sinus arrhythmia.  Normal intervals  September 2019 ECG (independently read by me): Sinus bradycardia at 51 bpm.  Mild RV conduction delay.  No significant ST changes.  PR interval 200 ms.  October 2018 ECG (independently read by me): Normal sinus rhythm with PACs.  PR interval 200 ms, QTc interval 412 ms.  April  2018 ECG (independently read by me): Normal sinus rhythm with sinus arrhythmia with an average heart rate at 60 bpm.  Normal intervals.  Small inferior Q waves.  February 2017 ECG (independently read by me): Sinus bradycardia with mild sinus arrhythmia at 58 bpm.  Mild RV conduction delay.  No significant ST segment changes.  July 2016 ECG (independently read by me): Sinus bradycardia with  mild sinus arrhythmia, heart rate ranging from 48-58.  December 2014 ECG: Normal sinus rhythm at 54 beats per minute; normal intervals.  LABS: I personally reviewed the blood work  from [facility] done on 07/19/2015.   Total cholesterol 142, triglycerides 120, HDL 61, LDL 57.  I extensively reviewed laboratory from [facility] primary care from 08/29/2016  I personally reviewed the laboratory as noted above from February 2019 Total cholesterol 125, triglycerides 73, HDL 49, LDL 61.  Personally reviewed lab work from March 22, 2019.     Latest Ref Rng & Units 08/14/2021    2:39 AM 08/13/2021   10:30 AM 08/13/2021    8:00 AM  BMP  Glucose 70 - 99 mg/dL 409  811    BUN 8 - 23 mg/dL 28  24    Creatinine 9.14 - 1.24 mg/dL 7.82  9.56    Sodium 213 - 145 mmol/L 136  141  140   Potassium 3.5 - 5.1 mmol/L 4.3  4.6  4.4   Chloride 98 - 111 mmol/L 106  107    CO2 22 - 32 mmol/L 22     Calcium 8.9 - 10.3 mg/dL 8.8         Latest Ref Rng & Units 08/09/2021    8:27 AM 05/03/2021    8:34 PM 03/18/2016   10:50 AM  Hepatic Function  Total Protein 6.5 - 8.1 g/dL 6.9  7.9  7.7   Albumin 3.5 - 5.0 g/dL 3.6  4.6  4.2   AST 15 - 41 U/L 17  18  22    ALT 0 - 44 U/L 14  11  16    Alk Phosphatase 38 - 126 U/L 63  61  70   Total Bilirubin 0.3 - 1.2 mg/dL 0.5  0.4  0.6       Latest Ref Rng & Units 08/14/2021    2:39 AM 08/13/2021   10:30 AM 08/13/2021    8:00 AM  CBC  WBC 4.0 - 10.5 K/uL 7.7     Hemoglobin 13.0 - 17.0 g/dL 08.6  57.8  46.9   Hematocrit 39.0 - 52.0 % 33.1  30.0  34.0   Platelets 150 - 400 K/uL 119      Lab  Results  Component Value Date   MCV 99.1 08/14/2021   MCV 100.8 (H) 08/09/2021   MCV 100.6 (H) 06/06/2021   Lab Results  Component Value Date   TSH 4.322 01/10/2016   Lab Results  Component Value Date   HGBA1C 7.2 (H) 01/10/2016     Lipid Panel     Component Value Date/Time   CHOL 145 01/18/2014 0803   CHOL 139 08/30/2012 0845   TRIG 70 01/18/2014 0803   TRIG 82 08/30/2012 0845   HDL 64 01/18/2014 0803   HDL 68 08/30/2012 0845   CHOLHDL 2.3 01/18/2014 0803   VLDL 14 01/18/2014 0803   LDLCALC 67 01/18/2014 0803   LDLCALC 55 08/30/2012 0845    ECHO:09/06/2020  1. Severe AS with mean gradient 47 mmg; DI 0.26; mild AI.   2. Left ventricular ejection fraction, by estimation, is 60 to 65%. The  left ventricle has normal function. The left ventricle has no regional  wall motion abnormalities. There is mild left ventricular hypertrophy.  Left ventricular diastolic parameters  are consistent with Grade I diastolic dysfunction (impaired relaxation).  Elevated left atrial pressure.   3. Right ventricular systolic function is normal. The right ventricular  size is normal. There is normal  pulmonary artery systolic pressure.   4. Left atrial size was moderately dilated.   5. Right atrial size was moderately dilated.   6. The mitral valve is normal in structure. Trivial mitral valve  regurgitation. No evidence of mitral stenosis. Moderate mitral annular  calcification.   7. The aortic valve is tricuspid. Aortic valve regurgitation is mild.  Severe aortic valve stenosis.   8. The inferior vena cava is normal in size with greater than 50%  respiratory variability, suggesting right atrial pressure of 3 mmHg.   Comparison(s): No significant change from prior study.   FINDINGS   Left Ventricle: Left ventricular ejection fraction, by estimation, is 60  to 65%. The left ventricle has normal function. The left ventricle has no  regional wall motion abnormalities. The left ventricular  internal cavity  size was normal in size. There is   mild left ventricular hypertrophy. Left ventricular diastolic parameters  are consistent with Grade I diastolic dysfunction (impaired relaxation).  Elevated left atrial pressure.   Right Ventricle: The right ventricular size is normal. Right ventricular  systolic function is normal. There is normal pulmonary artery systolic  pressure. The tricuspid regurgitant velocity is 2.67 m/s, and with an  assumed right atrial pressure of 3 mmHg,   the estimated right ventricular systolic pressure is 31.5 mmHg.   Left Atrium: Left atrial size was moderately dilated.   Right Atrium: Right atrial size was moderately dilated.   Pericardium: There is no evidence of pericardial effusion.   Mitral Valve: The mitral valve is normal in structure. Moderate mitral  annular calcification. Trivial mitral valve regurgitation. No evidence of  mitral valve stenosis.   Tricuspid Valve: The tricuspid valve is normal in structure. Tricuspid  valve regurgitation is mild . No evidence of tricuspid stenosis.   Aortic Valve: The aortic valve is tricuspid. Aortic valve regurgitation is  mild. Aortic regurgitation PHT measures 533 msec. Severe aortic stenosis  is present. Aortic valve mean gradient measures 47.5 mmHg. Aortic valve  peak gradient measures 77.1 mmHg.  Aortic valve area, by VTI measures 0.98 cm.   Pulmonic Valve: The pulmonic valve was not well visualized. Pulmonic valve  regurgitation is not visualized. No evidence of pulmonic stenosis.   Aorta: The aortic root is normal in size and structure.   Venous: The inferior vena cava is normal in size with greater than 50%  respiratory variability, suggesting right atrial pressure of 3 mmHg.   IAS/Shunts: No atrial level shunt detected by color flow Doppler.   Additional Comments: Severe AS with mean gradient 47 mmg; DI 0.26; mild  AI.      LEFT VENTRICLE  PLAX 2D  LVIDd:         4.70 cm   Diastology  LVIDs:         3.40 cm  LV e' medial:    4.14 cm/s  LV PW:         1.20 cm  LV E/e' medial:  24.0  LV IVS:        1.30 cm  LV e' lateral:   6.32 cm/s  LVOT diam:     2.20 cm  LV E/e' lateral: 15.7  LV SV:         114  LV SV Index:   61  LVOT Area:     3.80 cm  3D Volume EF:                          3D EF:        66 %                          LV EDV:       119 ml                          LV ESV:       41 ml                          LV SV:        79 ml   RIGHT VENTRICLE  RV S prime:     10.30 cm/s  TAPSE (M-mode): 1.4 cm   LEFT ATRIUM             Index       RIGHT ATRIUM           Index  LA diam:        3.40 cm 1.82 cm/m  RA Area:     23.40 cm  LA Vol (A2C):   56.8 ml 30.41 ml/m RA Volume:   77.70 ml  41.61 ml/m  LA Vol (A4C):   51.9 ml 27.79 ml/m  LA Biplane Vol: 57.5 ml 30.79 ml/m   AORTIC VALVE  AV Area (Vmax):    1.03 cm  AV Area (Vmean):   0.94 cm  AV Area (VTI):     0.98 cm  AV Vmax:           439.00 cm/s  AV Vmean:          332.500 cm/s  AV VTI:            1.155 m  AV Peak Grad:      77.1 mmHg  AV Mean Grad:      47.5 mmHg  LVOT Vmax:         119.00 cm/s  LVOT Vmean:        82.000 cm/s  LVOT VTI:          0.299 m  LVOT/AV VTI ratio: 0.26  AI PHT:            533 msec     AORTA  Ao Root diam: 3.10 cm  Ao Asc diam:  3.70 cm   MITRAL VALVE                TRICUSPID VALVE  MV Area (PHT): 2.37 cm     TR Peak grad:   28.5 mmHg  MV Decel Time: 320 msec     TR Vmax:        267.00 cm/s  MV E velocity: 99.20 cm/s  MV A velocity: 122.00 cm/s  SHUNTS  MV E/A ratio:  0.81         Systemic VTI:  0.30 m                              Systemic Diam: 2.20 cm     ECHO: 03/19/2021  1. Left ventricular ejection fraction, by estimation, is 60 to 65%. The  left ventricle has normal function. The left ventricle has no regional  wall motion abnormalities. There is mild left  ventricular hypertrophy.  Left ventricular diastolic  parameters  were normal.   2. Right ventricular systolic function is normal. The right ventricular  size is normal. There is normal pulmonary artery systolic pressure.   3. Left atrial size was moderately dilated.   4. The mitral valve is degenerative. Trivial mitral valve regurgitation.  No evidence of mitral stenosis. Moderate mitral annular calcification.   5. Gradients have increased and AVA decreased since echo done 8.11/22.  The aortic valve is tricuspid. There is severe calcifcation of the aortic  valve. There is severe thickening of the aortic valve. Aortic valve  regurgitation is mild. Severe aortic  valve stenosis.   6. The inferior vena cava is normal in size with greater than 50%  respiratory variability, suggesting right atrial pressure of 3 mmHg.   FINDINGS   Left Ventricle: Left ventricular ejection fraction, by estimation, is 60  to 65%. The left ventricle has normal function. The left ventricle has no  regional wall motion abnormalities. The left ventricular internal cavity  size was normal in size. There is   mild left ventricular hypertrophy. Left ventricular diastolic parameters  were normal.   Right Ventricle: The right ventricular size is normal. No increase in  right ventricular wall thickness. Right ventricular systolic function is  normal. There is normal pulmonary artery systolic pressure. The tricuspid  regurgitant velocity is 2.78 m/s, and   with an assumed right atrial pressure of 3 mmHg, the estimated right  ventricular systolic pressure is 33.9 mmHg.   Left Atrium: Left atrial size was moderately dilated.   Right Atrium: Right atrial size was normal in size.   Pericardium: There is no evidence of pericardial effusion.   Mitral Valve: The mitral valve is degenerative in appearance. There is  moderate thickening of the mitral valve leaflet(s). There is moderate  calcification of the mitral valve leaflet(s). Moderate mitral annular  calcification.  Trivial mitral valve  regurgitation. No evidence of mitral valve stenosis.   Tricuspid Valve: The tricuspid valve is normal in structure. Tricuspid  valve regurgitation is mild . No evidence of tricuspid stenosis.   Aortic Valve: Gradients have increased and AVA decreased since echo done  8.11/22. The aortic valve is tricuspid. There is severe calcifcation of  the aortic valve. There is severe thickening of the aortic valve. Aortic  valve regurgitation is mild. Aortic  regurgitation PHT measures 504 msec. Severe aortic stenosis is present.  Aortic valve mean gradient measures 53.0 mmHg. Aortic valve peak gradient  measures 83.2 mmHg. Aortic valve area, by VTI measures 0.80 cm.   Pulmonic Valve: The pulmonic valve was normal in structure. Pulmonic valve  regurgitation is not visualized. No evidence of pulmonic stenosis.   Aorta: The aortic root is normal in size and structure.   Venous: The inferior vena cava is normal in size with greater than 50%  respiratory variability, suggesting right atrial pressure of 3 mmHg.   IAS/Shunts: No atrial level shunt detected by color flow Doppler.      LEFT VENTRICLE  PLAX 2D  LVIDd:         4.50 cm   Diastology  LVIDs:         2.60 cm   LV e' medial:    5.00 cm/s  LV PW:         0.90 cm   LV E/e' medial:  22.4  LV IVS:        1.30 cm   LV e' lateral:   9.03 cm/s  LVOT diam:     2.00 cm   LV E/e' lateral: 12.4  LV SV:         103  LV SV Index:   55        2D Longitudinal Strain  LVOT Area:     3.14 cm  2D Strain GLS (A2C):   -16.6 %                           2D Strain GLS (A3C):   -18.8 %                           2D Strain GLS (A4C):   -18.1 %                           2D Strain GLS Avg:     -17.8 %   RIGHT VENTRICLE  RV Basal diam:  4.15 cm  RV Mid diam:    3.30 cm  RV S prime:     10.40 cm/s  TAPSE (M-mode): 2.6 cm  RVSP:           33.9 mmHg   LEFT ATRIUM             Index        RIGHT ATRIUM           Index  LA diam:        4.40  cm 2.33 cm/m   RA Pressure: 3.00 mmHg  LA Vol (A2C):   67.3 ml 35.64 ml/m  RA Area:     16.90 cm  LA Vol (A4C):   69.2 ml 36.64 ml/m  RA Volume:   43.10 ml  22.82 ml/m  LA Biplane Vol: 66.7 ml 35.32 ml/m   AORTIC VALVE  AV Area (Vmax):    0.75 cm  AV Area (Vmean):   0.71 cm  AV Area (VTI):     0.80 cm  AV Vmax:           456.00 cm/s  AV Vmean:          352.000 cm/s  AV VTI:            1.290 m  AV Peak Grad:      83.2 mmHg  AV Mean Grad:      53.0 mmHg  LVOT Vmax:         109.00 cm/s  LVOT Vmean:        79.600 cm/s  LVOT VTI:          0.329 m  LVOT/AV VTI ratio: 0.26  AI PHT:            504 msec     AORTA  Ao Root diam: 3.10 cm  Ao Asc diam:  3.50 cm   MITRAL VALVE                TRICUSPID VALVE  MV Area (PHT):              TR Peak grad:   30.9 mmHg  MV Decel Time:              TR Vmax:        278.00 cm/s  MV E velocity: 112.00 cm/s  Estimated RAP:  3.00 mmHg  MV A velocity: 101.00 cm/s  RVSP:           33.9 mmHg  MV E/A ratio:  1.11                              SHUNTS                              Systemic VTI:  0.33 m                              Systemic Diam: 2.00 cm    ECHO: 08/13/2022  1. Left ventricular ejection fraction, by estimation, is 55 to 60%. The  left ventricle has normal function. The left ventricle has no regional  wall motion abnormalities. There is moderate concentric left ventricular  hypertrophy. Left ventricular  diastolic parameters are consistent with Grade II diastolic dysfunction  (pseudonormalization).   2. Peak RV-RA gradient 27 mmHg. Right ventricular systolic function is  normal. The right ventricular size is normal.   3. Left atrial size was mild to moderately dilated.   4. Right atrial size was mild to moderately dilated.   5. The mitral valve is normal in structure. Trivial mitral valve  regurgitation. No evidence of mitral stenosis. Moderate mitral annular  calcification.   6. S/p TAVR with 23 mm Edwards Sapien THV. Mean  gradient 12 mmHg with EOA  1.59 cm^2. Trivial peri-valvular leakage. Normally functioning TAVR valve.   FINDINGS   Left Ventricle: Left ventricular ejection fraction, by estimation, is 55  to 60%. The left ventricle has normal function. The left ventricle has no  regional wall motion abnormalities. The left ventricular internal cavity  size was normal in size. There is   moderate concentric left ventricular hypertrophy. Left ventricular  diastolic parameters are consistent with Grade II diastolic dysfunction  (pseudonormalization).   Right Ventricle: Peak RV-RA gradient 27 mmHg. The right ventricular size  is normal. No increase in right ventricular wall thickness. Right  ventricular systolic function is normal.   Left Atrium: Left atrial size was mild to moderately dilated.   Right Atrium: Right atrial size was mild to moderately dilated.   Pericardium: There is no evidence of pericardial effusion.   Mitral Valve: The mitral valve is normal in structure. Moderate mitral  annular calcification. Trivial mitral valve regurgitation. No evidence of  mitral valve stenosis.   Tricuspid Valve: The tricuspid valve is normal in structure. Tricuspid  valve regurgitation is trivial.   Aortic Valve: S/p TAVR with 23 mm Edwards Sapien THV. Mean gradient 12  mmHg with EOA 1.59 cm^2. Trivial peri-valvular leakage. Normally  functioning TAVR valve. The aortic valve has been repaired/replaced.  Aortic valve regurgitation is trivial. Aortic  valve mean gradient measures 8.0 mmHg. Aortic valve peak gradient measures  23.5 mmHg. Aortic valve area, by VTI measures 1.59 cm.   Pulmonic Valve: The pulmonic valve was normal in structure. Pulmonic valve  regurgitation is not visualized.   Aorta: The aortic root is normal in size and structure.   Venous: The inferior vena cava was not well visualized.   IAS/Shunts: No atrial level shunt detected by color flow Doppler.     LEFT VENTRICLE  PLAX 2D   LVIDd:         4.60 cm  LVIDs:         3.20 cm  LV PW:         1.20 cm  LV IVS:  1.50 cm  LVOT diam:     2.00 cm  LV SV:         87  LV SV Index:   46  LVOT Area:     3.14 cm     RIGHT VENTRICLE  RV S prime:     12.20 cm/s  TAPSE (M-mode): 2.1 cm  RVSP:           29.6 mmHg   LEFT ATRIUM             Index        RIGHT ATRIUM           Index  LA diam:        4.70 cm 2.45 cm/m   RA Pressure: 3.00 mmHg  LA Vol (A2C):   44.8 ml 23.37 ml/m  RA Area:     24.70 cm  LA Vol (A4C):   76.1 ml 39.70 ml/m  RA Volume:   75.80 ml  39.55 ml/m  LA Biplane Vol: 59.8 ml 31.20 ml/m   AORTIC VALVE  AV Area (Vmax):    1.44 cm  AV Area (Vmean):   1.55 cm  AV Area (VTI):     1.59 cm  AV Vmax:           242.20 cm/s  AV Vmean:          157.600 cm/s  AV VTI:            0.548 m  AV Peak Grad:      23.5 mmHg  AV Mean Grad:      8.0 mmHg  LVOT Vmax:         111.20 cm/s  LVOT Vmean:        77.860 cm/s  LVOT VTI:          0.278 m  LVOT/AV VTI ratio: 0.51    AORTA  Ao Root diam: 3.00 cm  Ao Asc diam:  3.70 cm   MITRAL VALVE                TRICUSPID VALVE  MV Area (PHT): 2.88 cm     TR Peak grad:   26.6 mmHg  MV Decel Time: 264 msec     TR Vmax:        258.00 cm/s  MV E velocity: 129.00 cm/s  Estimated RAP:  3.00 mmHg  MV A velocity: 122.80 cm/s  RVSP:           29.6 mmHg  MV E/A ratio:  1.05                              SHUNTS                              Systemic VTI:  0.28 m                              Systemic Diam: 2.00 cm     IMPRESSION:  1. Hx of CABG: November 2009   2. Ostial Left main stent: Jun 05, 2021   3. Aortic stenosis, severe   4. S/P TAVR (transcatheter aortic valve replacement): August 13, 2021   5. Stage 3b chronic kidney disease (HCC)   6. Hyperlipidemia, mixed   7. Hypothyroidism, unspecified type     ASSESSMENT AND PLAN Mr.Hoskin is  an 89 year old gentleman who underwent emergent CABG revascularization surgery for life-threatening coronary anatomy in  November 2009 by Dr.Bartle.  He has a history of peripheral vascular disease and underwent successful right carotid endarterectomy by Dr. Myra [name]. Carotid studies from February 2022 and and February 2023 showed stable velocities in the right carotid with slight increase in the left carotid now in the 40 to 59% range.  He was followed closely by me with reference to his aortic stenosis which showed cyst and slow progression to ultimately being severe.  He underwent structural heart team evaluation with Dr. Excell Seltzer on October 29, 2020 after his mean gradient had increased to 47 mmHg.  At that time he was felt to have severe, stage C aortic stenosis but remained entirely asymptomatic.  A subsequent echo Doppler study from February 2022 again shows slight progression with his peak instantaneous gradient now increasing to 83 mm and his mean gradient 53 mmHg.  Due to progression of disease with very mild symptoms and stage D1 severe AS he underwent right and left heart catheterization by me on May 14, 2020.  Right heart pressures were only minimally elevated and severe AS was verified with a mean gradient of 53 and peak gradient of 83.  Due to high-grade ostial left main stenosis, he underwent successful ostial stenting of his left main IVUS guided on Jun 05, 2021 by Dr. Excell Seltzer.  On August 13, 2021 he underwent successful TAVR with 23 mm Edwards SAPIEN 3 ultra Resilia THV via the TF approach.  Initial postoperative echo showed a mean gradient of 12 and on repeat 1 month later the mean gradient had increased slightly to 20 mm with his 23 mm valve.  When subsequently seen by me blood pressure was slightly increased and with mild lower extremity edema I suggested the addition of HCTZ 12.5 mg to his daily regimen of amlodipine 10 mg and metoprolol succinate 50 mg.  He has continued to be on DAPT with his left main ostial stent has been on rosuvastatin 40 mg and omega-3 fatty acid for mixed hyperlipidemia.  At his last office  visit with me in March 2024 he had 1+ tense lower extremity edema and HCTZ was discontinued and substituted for with furosemide.  He has subsequently been seen by Carlean Jews, PA-C in July 2024 following his echo Doppler study.  His TAVR valve remained stable.  EF was 52%.  There was a mean gradient of 12 mm and trivial perivalvular leak as well as moderate mitral annular calcification and biatrial enlargement.  Mr. Tipple continues to do well on his current regimen.  He is not having anginal symptoms and is walking regularly.  He plays golf 2 days/week at [employer].  Blood pressure today is stable on amlodipine 10 mg, furosemide 20 mg, metoprolol succinate 50 mg.  He continues to be on omega-3 fatty acid and rosuvastatin 40 mg for hyperlipidemia.  He underwent low back surgery by Dr. Irene Limbo for squamous cell CA at [facility] dermatology.  Clinically he continues to do well.  I will plan to see him in 6 months for follow-up evaluation.  With my planned retirement next year, I have suggested after that evaluation that he transition to be followed by Dr. Excell Seltzer who has done his TAVR.  He is already scheduled to undergo a follow-up 2D echo Doppler study on August 06, 2023.   Lennette Bihari, MD, [facility]  11/08/2022 12:27 PM

## 2022-11-05 NOTE — Patient Instructions (Signed)
Medication Instructions:  No medication changes *If you need a refill on your cardiac medications before your next appointment, please call your pharmacy*   Lab Work: No labs ordered today If you have labs (blood work) drawn today and your tests are completely normal, you will receive your results only by: MyChart Message (if you have MyChart) OR A paper copy in the mail If you have any lab test that is abnormal or we need to change your treatment, we will call you to review the results.   Testing/Procedures: No testing ordered   Follow-Up: At [facility], you and your health needs are our priority.  As part of our continuing mission to provide you with exceptional heart care, we have created designated Provider Care Teams.  These Care Teams include your primary Cardiologist (physician) and Advanced Practice Providers (APPs -  Physician Assistants and Nurse Practitioners) who all work together to provide you with the care you need, when you need it.  We recommend signing up for the patient portal called "MyChart".  Sign up information is provided on this After Visit Summary.  MyChart is used to connect with patients for Virtual Visits (Telemedicine).  Patients are able to view lab/test results, encounter notes, upcoming appointments, etc.  Non-urgent messages can be sent to your provider as well.   To learn more about what you can do with MyChart, go to [email or web address].    Your next appointment:   6 month(s)  Provider:   Tonny Bollman, MD

## 2022-11-08 ENCOUNTER — Encounter: Payer: Self-pay | Admitting: Cardiovascular Disease

## 2022-11-13 ENCOUNTER — Other Ambulatory Visit: Payer: Self-pay

## 2022-11-13 MED ORDER — CLOPIDOGREL BISULFATE 75 MG PO TABS: 75.0000 mg | ORAL_TABLET | Freq: Every day | ORAL | 3 refills | Status: DC

## 2022-11-20 ENCOUNTER — Encounter: Payer: Self-pay | Admitting: Cardiovascular Disease

## 2022-11-20 MED ORDER — FOLIC ACID 1 MG PO TABS: 1.0000 mg | ORAL_TABLET | Freq: Every day | ORAL | 3 refills | Status: DC

## 2023-02-16 ENCOUNTER — Other Ambulatory Visit: Payer: Self-pay

## 2023-02-16 MED ORDER — FUROSEMIDE 20 MG PO TABS: 20.0000 mg | ORAL_TABLET | Freq: Every day | ORAL | 2 refills | Status: DC

## 2023-04-03 ENCOUNTER — Other Ambulatory Visit: Payer: Self-pay

## 2023-04-03 MED ORDER — METOPROLOL SUCCINATE ER 50 MG PO TB24: 50.0000 mg | ORAL_TABLET | Freq: Every day | ORAL | 2 refills | Status: AC

## 2023-04-22 ENCOUNTER — Other Ambulatory Visit: Payer: Self-pay

## 2023-04-22 DIAGNOSIS — I6523 Occlusion and stenosis of bilateral carotid arteries: Secondary | ICD-10-CM

## 2023-05-04 ENCOUNTER — Ambulatory Visit (HOSPITAL_COMMUNITY)
Admission: RE | Admit: 2023-05-04 | Discharge: 2023-05-04 | Disposition: A | Discharge status: Home / Self Care | Payer: PPO | Source: Ambulatory Visit | Attending: Surgery | Admitting: Surgery

## 2023-05-04 ENCOUNTER — Ambulatory Visit: Payer: PPO | Admitting: Physician Assistant

## 2023-05-04 ENCOUNTER — Other Ambulatory Visit: Payer: Self-pay

## 2023-05-04 VITALS — BP 168/89 | HR 54 | Temp 97.9°F | Ht 66.0 in | Wt 179.0 lb

## 2023-05-04 DIAGNOSIS — I6523 Occlusion and stenosis of bilateral carotid arteries: Secondary | ICD-10-CM | POA: Diagnosis present

## 2023-05-04 NOTE — Progress Notes (Signed)
 History of Present Illness:  Patient is a 89 y.o. year old male who presents for evaluation of carotid stenosis.  He is s/p right CEA by Dr. Myra Gianotti on 03/26/16. This was for asymptomatic high grade stenosis. His left ICA was < 39% stenosis on his last visit a year ago.  He has no history of TIA or stroke.   He is here today for carotid surveillance.  He denies symptoms of stroke/TIA.  No amaurosis, weakness on one side of the body, aphasia or facial droop.     Successful TAVR in 2023 by Dr. Excell Seltzer  The pt is on a statin for cholesterol management.  The pt is on a daily aspirin.   Other AC:  Plavix The pt is on ARB, BB, CCB for hypertension.   The pt is not diabetic.   Tobacco hx:  Former  Past Medical History:  Diagnosis Date   CAD (coronary artery disease)    History of hiatal hernia    Hypertension    Hypothyroidism    Pneumonia 12/2015   hx   S/P CABG x 3 12/17/2007   LIMA to LAD,SVG to left C   S/P TAVR (transcatheter aortic valve replacement) 08/13/2021   s/p TAVR with a 23 mm Edwards S3UR via the TF approach by Dr. Excell Seltzer & Dr. Laneta Simmers   Severe aortic stenosis    s/p TAVR    Past Surgical History:  Procedure Laterality Date   CORONARY ARTERY BYPASS GRAFT  12/17/07   LIMA to LAD,vein to obtuse marginal,vein to RCA   CORONARY STENT INTERVENTION N/A 06/05/2021   Procedure: CORONARY STENT INTERVENTION;  Surgeon: Tonny Bollman, MD;  Location: [facility] INVASIVE CV LAB;  Service: Cardiovascular;  Laterality: N/A;   EMPYEMA DRAINAGE Left 01/11/2016   Procedure: EMPYEMA DRAINAGE;  Surgeon: Kerin Perna, MD;  Location[facility] OR;  Service: Thoracic;  Laterality: Left;   ENDARTERECTOMY Right 03/26/2016   Procedure: RIGHT CAROTID ENDARTERECTOMY;  Surgeon: Nada Libman, MD;  Location: [facility] OR;  Service: Vascular;  Laterality: Right;   INTRAOPERATIVE TRANSTHORACIC ECHOCARDIOGRAM N/A 08/13/2021   Procedure: INTRAOPERATIVE TRANSTHORACIC ECHOCARDIOGRAM;  Surgeon: Tonny Bollman, MD;   Location: [facility] OR;  Service: Open Heart Surgery;  Laterality: N/A;   PATCH ANGIOPLASTY Right 03/26/2016   Procedure: PATCH ANGIOPLASTY USING Livia Snellen BIOLOGIC PATCH;  Surgeon: Nada Libman, MD;  Location: [facility] OR;  Service: Vascular;  Laterality: Right;   RIGHT HEART CATH AND CORONARY/GRAFT ANGIOGRAPHY N/A 05/14/2021   Procedure: RIGHT HEART CATH AND CORONARY/GRAFT ANGIOGRAPHY;  Surgeon: Lennette Bihari, MD;  Location: [facility] INVASIVE CV LAB;  Service: Cardiovascular;  Laterality: N/A;   TRANSCATHETER AORTIC VALVE REPLACEMENT, TRANSFEMORAL N/A 08/13/2021   Procedure: Transcatheter Aortic Valve Replacement, Transfemoral;  Surgeon: Tonny Bollman, MD;  Location: [facility] OR;  Service: Open Heart Surgery;  Laterality: N/A;   ULTRASOUND GUIDANCE FOR VASCULAR ACCESS Bilateral 08/13/2021   Procedure: ULTRASOUND GUIDANCE FOR VASCULAR ACCESS, BILATERAL FEMORAL ARTERIES AND LEFT FEMORAL VEIN;  Surgeon: Tonny Bollman, MD;  Location: [facility] OR;  Service: Open Heart Surgery;  Laterality: Bilateral;   VIDEO ASSISTED THORACOSCOPY (VATS)/DECORTICATION Left 01/11/2016   Procedure: VIDEO ASSISTED THORACOSCOPY (VATS)/DECORTICATION;  Surgeon: Kerin Perna, MD;  Location: [facility] OR;  Service: Thoracic;  Laterality: Left;     Social History Social History   Tobacco Use   Smoking status: Former    Current packs/day: 0.00    Types: Cigarettes    Quit date: 01/28/1971    Years since quitting: 52.2  Passive exposure: Never   Smokeless tobacco: Never  Vaping Use   Vaping status: Never Used  Substance Use Topics   Alcohol use: Yes    Alcohol/week: 14.0 standard drinks of alcohol    Types: 14 Standard drinks or equivalent per week    Comment: 1-2 drinks per day (mixture of different types)   Drug use: No    Family History Family History  Problem Relation Age of Onset   Heart attack Father     Allergies  No Known Allergies   Current Outpatient Medications  Medication Sig Dispense Refill   amLODipine (NORVASC) 10  MG tablet Take 1 tablet (10 mg total) by mouth daily. 180 tablet 3   aspirin EC 81 MG tablet Take 81 mg by mouth at bedtime.      Cholecalciferol (VITAMIN D) 2000 units tablet Take 2,000 Units by mouth at bedtime.     clopidogrel (PLAVIX) 75 MG tablet Take 1 tablet (75 mg total) by mouth daily. 90 tablet 3   Cyanocobalamin (B-12) 2000 MCG TABS Take 2,000 mcg by mouth daily.     fexofenadine (ALLEGRA) 180 MG tablet Take 180 mg by mouth daily.     folic acid (FOLVITE) 1 MG tablet Take 1 tablet (1 mg total) by mouth daily. 90 tablet 3   furosemide (LASIX) 20 MG tablet Take 1 tablet (20 mg total) by mouth daily. 90 tablet 2   imiquimod (ALDARA) 5 % cream Apply 1 Application topically at bedtime.     levothyroxine (SYNTHROID, LEVOTHROID) 112 MCG tablet Take 120 mcg by mouth daily before breakfast.     metoprolol succinate (TOPROL-XL) 50 MG 24 hr tablet Take 1 tablet (50 mg total) by mouth daily. Take with or immediately following a meal. 90 tablet 2   mupirocin cream (BACTROBAN) 2 % Apply 1 application topically daily as needed (after skin treatments).      mupirocin ointment (BACTROBAN) 2 % Apply 1 Application topically 2 (two) times daily.     Omega-3 1400 MG CAPS Take 1,400 mg by mouth daily.     rosuvastatin (CRESTOR) 40 MG tablet Take 1 tablet (40 mg total) by mouth daily. 90 tablet 3   terbinafine (LAMISIL) 1 % cream Apply 1 application topically daily as needed (rash).      No current facility-administered medications for this visit.    ROS:   General:  No weight loss, Fever, chills  HEENT: No recent headaches, no nasal bleeding, no visual changes, no sore throat  Neurologic: No dizziness, blackouts, seizures. No recent symptoms of stroke or mini- stroke. No recent episodes of slurred speech, or temporary blindness.  Cardiac: No recent episodes of chest pain/pressure, no shortness of breath at rest.  No shortness of breath with exertion.  Denies history of atrial fibrillation or  irregular heartbeat  Vascular: No history of rest pain in feet.  No history of claudication.  No history of non-healing ulcer, No history of DVT   Pulmonary: No home oxygen, no productive cough, no hemoptysis,  No asthma or wheezing  Musculoskeletal:  [ ]  Arthritis, [ ]  Low back pain,  [ ]  Joint pain  Hematologic:No history of hypercoagulable state.  No history of easy bleeding.  No history of anemia  Gastrointestinal: No hematochezia or melena,  No gastroesophageal reflux, no trouble swallowing  Urinary: [x ] chronic Kidney disease, [ ]  on HD - [ ]  MWF or [ ]  TTHS, [ ]  Burning with urination, [ ]  Frequent urination, [ ]  Difficulty urinating;  Skin: No rashes  Psychological: No history of anxiety,  No history of depression   Physical Examination  Vitals:   05/04/23 1132  BP: (!) 168/89  Pulse: (!) 54  Temp: 97.9 F (36.6 C)  TempSrc: Temporal  SpO2: 93%  Weight: 179 lb (81.2 kg)  Height: 5\' 6"  (1.676 m)    Body mass index is 28.89 kg/m.  General:  Alert and oriented, no acute distress HEENT: Normal Neck: left bruit/ murmer  Pulmonary: Clear to auscultation bilaterally Cardiac: Regular Rate and Rhythm without murmur Gastrointestinal: Soft, non-tender, non-distended, no mass, no scars Skin: No rash Extremity Pulses:  radial,  pulses bilaterally Musculoskeletal: No deformity or edema  Neurologic: Upper and lower extremity motor 5/5 and symmetric  DATA:  Right Carotid Findings:  +----------+--------+--------+--------+------------------+--------+           PSV cm/sEDV cm/sStenosisPlaque DescriptionComments  +----------+--------+--------+--------+------------------+--------+  CCA Prox  83      14                                          +----------+--------+--------+--------+------------------+--------+  CCA Mid   89      19                                          +----------+--------+--------+--------+------------------+--------+  CCA  Distal79      17                                          +----------+--------+--------+--------+------------------+--------+  ICA Prox  125     28                                          +----------+--------+--------+--------+------------------+--------+  ICA Mid   72      15                                          +----------+--------+--------+--------+------------------+--------+  ICA Distal52      12                                          +----------+--------+--------+--------+------------------+--------+  ECA      118     1                                           +----------+--------+--------+--------+------------------+--------+   +----------+--------+-------+----------------+-------------------+           PSV cm/sEDV cmsDescribe        Arm Pressure (mmHG)  +----------+--------+-------+----------------+-------------------+  [id number]    0      Multiphasic, [id number]                  +----------+--------+-------+----------------+-------------------+   +---------+--------+--+--------+-+---------+  VertebralPSV cm/s56EDV cm/s5Antegrade  +---------+--------+--+--------+-+---------+      Left Carotid Findings:  +----------+--------+--------+--------+------------------+--------+  PSV cm/sEDV cm/sStenosisPlaque DescriptionComments  +----------+--------+--------+--------+------------------+--------+  CCA Prox  76      10                                          +----------+--------+--------+--------+------------------+--------+  CCA Mid   75      9                                           +----------+--------+--------+--------+------------------+--------+  CCA Distal56      12              calcific                    +----------+--------+--------+--------+------------------+--------+  ICA Prox  551     148             calcific                     +----------+--------+--------+--------+------------------+--------+  ICA Mid   141     0                                           +----------+--------+--------+--------+------------------+--------+  ICA Distal89      15                                tortuous  +----------+--------+--------+--------+------------------+--------+   +----------+--------+--------+----------------+-------------------+           PSV cm/sEDV cm/sDescribe        Arm Pressure (mmHG)  +----------+--------+--------+----------------+-------------------+  Subclavian220    1       Multiphasic, [id number]                  +----------+--------+--------+----------------+-------------------+   +---------+--------+--+--------+--+---------+  VertebralPSV cm/s77EDV cm/s13Antegrade  +---------+--------+--+--------+--+---------+     Summary:  Right Carotid: Velocities in the right ICA are consistent with a 1-39%  stenosis.   Left Carotid: Velocities in the left ICA are consistent with a 80-99%  stenosis.   Vertebrals: Bilateral vertebral arteries demonstrate antegrade flow.  Subclavians: Normal flow hemodynamics were seen in bilateral subclavian               arteries.    ASSESSMENT/PLAN: Dillon [name] is a 89 y.o. male who presents for surveillance of carotid artery stenosis   The right ICA is < 39% stenosis, the left ICA is reported to have 80-99% stenosis with calcification.  The last carotid duplex demonstrated that the left ICA was < 39% stenosed a year ago.  He is asymptomatic for stroke/TIA.  I discussed these findings with Dr. Myra Gianotti and we will order a CTA to better define the stenosis.  The patient agrees with this plan of care.  He will continue to take ASA and Plavix.  He states he has CKD stage 2.  01/06/23 Cr 1.58, GFR 42.     Dillon Pigeon PA-C [facility] [place] Office: [phone]  MD in clinic [place]

## 2023-05-05 ENCOUNTER — Ambulatory Visit: Payer: PPO | Admitting: Cardiovascular Disease

## 2023-05-26 ENCOUNTER — Ambulatory Visit
Admission: RE | Admit: 2023-05-26 | Discharge: 2023-05-26 | Disposition: A | Discharge status: Home / Self Care | Source: Ambulatory Visit | Attending: Surgery | Admitting: Surgery

## 2023-05-26 DIAGNOSIS — I6523 Occlusion and stenosis of bilateral carotid arteries: Secondary | ICD-10-CM

## 2023-05-26 MED ORDER — IOPAMIDOL (ISOVUE-370) INJECTION 76%
100.0000 mL | Freq: Once | INTRAVENOUS | Status: AC | PRN
  Administered 2023-05-26: 100 mL via INTRAVENOUS

## 2023-06-01 ENCOUNTER — Ambulatory Visit: Attending: Surgery | Admitting: Surgery

## 2023-06-01 ENCOUNTER — Telehealth: Payer: Self-pay

## 2023-06-01 ENCOUNTER — Encounter: Payer: Self-pay | Admitting: Surgery

## 2023-06-01 VITALS — BP 156/76 | HR 64 | Temp 98.2°F | Resp 20 | Ht 66.0 in | Wt 178.0 lb

## 2023-06-01 DIAGNOSIS — I6523 Occlusion and stenosis of bilateral carotid arteries: Secondary | ICD-10-CM | POA: Diagnosis not present

## 2023-06-01 NOTE — Progress Notes (Signed)
 [facility]  Patient name: Dillon [name] MRN: [id number] DOB: [birth year] Sex: male   REASON FOR VISIT:    Follow up  HISOTRY OF PRESENT ILLNESS:    Dillon [name] is a 89 y.o. male who is status post right carotid endarterectomy on 03/26/2016 for asymptomatic stenosis.  Intraoperative findings included a heavily calcified lesion with approximately 95% stenosis.  There was significant redundancy in the internal carotid artery and therefore resection with primary anastomosis of the internal carotid artery was performed.   His most recent duplex suggested greater than 80% left-sided stenosis and so he was sent for CT scan.  He is back today to discuss these results.  He is without neurologic issues.  He has a history of coronary artery disease status post CABG.  He is also undergone TAVR.  He is medically managed for hypertension with an ARB.  He is a former smoker.  He takes a statin for hypercholesterolemia.  PAST MEDICAL HISTORY:   Past Medical History:  Diagnosis Date   CAD (coronary artery disease)    History of hiatal hernia    Hypertension    Hypothyroidism    Pneumonia 12/2015   hx   S/P CABG x 3 12/17/2007   LIMA to LAD,SVG to left C   S/P TAVR (transcatheter aortic valve replacement) 08/13/2021   s/p TAVR with a 23 mm Edwards S3UR via the TF approach by Dr. Arlester Ladd & Dr. Sherene Dilling   Severe aortic stenosis    s/p TAVR     FAMILY HISTORY:   Family History  Problem Relation Age of Onset   Heart attack Father     SOCIAL HISTORY:   Social History   Tobacco Use   Smoking status: Former    Current packs/day: 0.00    Types: Cigarettes    Quit date: 01/28/1971    Years since quitting: 52.3    Passive exposure: Never   Smokeless tobacco: Never  Substance Use Topics   Alcohol use: Yes    Alcohol/week: 14.0 standard drinks of alcohol    Types: 14 Standard drinks or equivalent per week    Comment: 1-2 drinks  per day (mixture of different types)     ALLERGIES:   No Known Allergies   CURRENT MEDICATIONS:   Current Outpatient Medications  Medication Sig Dispense Refill   amLODipine  (NORVASC ) 10 MG tablet Take 1 tablet (10 mg total) by mouth daily. 180 tablet 3   aspirin  EC 81 MG tablet Take 81 mg by mouth at bedtime.      Cholecalciferol (VITAMIN D) 2000 units tablet Take 2,000 Units by mouth at bedtime.     clopidogrel  (PLAVIX ) 75 MG tablet Take 1 tablet (75 mg total) by mouth daily. 90 tablet 3   Cyanocobalamin (B-12) 2000 MCG TABS Take 2,000 mcg by mouth daily.     fexofenadine (ALLEGRA) 180 MG tablet Take 180 mg by mouth daily.     folic acid  (FOLVITE ) 1 MG tablet Take 1 tablet (1 mg total) by mouth daily. 90 tablet 3   furosemide  (LASIX ) 20 MG tablet Take 1 tablet (20 mg total) by mouth daily. 90 tablet 2   imiquimod (ALDARA) 5 % cream Apply 1 Application topically at bedtime.     levothyroxine  (SYNTHROID , LEVOTHROID) 112 MCG tablet Take 120 mcg by mouth daily before breakfast.     metoprolol  succinate (TOPROL -XL) 50 MG 24 hr tablet Take 1 tablet (50 mg total) by mouth daily. Take with or immediately following  a meal. 90 tablet 2   mupirocin cream (BACTROBAN) 2 % Apply 1 application topically daily as needed (after skin treatments).      mupirocin ointment (BACTROBAN) 2 % Apply 1 Application topically 2 (two) times daily.     Omega-3 1400 MG CAPS Take 1,400 mg by mouth daily.     rosuvastatin  (CRESTOR ) 40 MG tablet Take 1 tablet (40 mg total) by mouth daily. 90 tablet 3   terbinafine (LAMISIL) 1 % cream Apply 1 application topically daily as needed (rash).      No current facility-administered medications for this visit.    REVIEW OF SYSTEMS:   [X]  denotes positive finding, [ ]  denotes negative finding Cardiac  Comments:  Chest pain or chest pressure:    Shortness of breath upon exertion:    Short of breath when lying flat:    Irregular heart rhythm:        Vascular    Pain  in calf, thigh, or hip brought on by ambulation:    Pain in feet at night that wakes you up from your sleep:     Blood clot in your veins:    Leg swelling:         Pulmonary    Oxygen at home:    Productive cough:     Wheezing:         Neurologic    Sudden weakness in arms or legs:     Sudden numbness in arms or legs:     Sudden onset of difficulty speaking or slurred speech:    Temporary loss of vision in one eye:     Problems with dizziness:         Gastrointestinal    Blood in stool:     Vomited blood:         Genitourinary    Burning when urinating:     Blood in urine:        Psychiatric    Major depression:         Hematologic    Bleeding problems:    Problems with blood clotting too easily:        Skin    Rashes or ulcers:        Constitutional    Fever or chills:      PHYSICAL EXAM:   Vitals:   06/01/23 1345 06/01/23 1347  BP: (!) 151/72 (!) 156/76  Pulse: 64   Resp: 20   Temp: 98.2 F (36.8 C)   SpO2: 92%   Weight: 178 lb (80.7 kg)   Height: 5\' 6"  (1.676 m)     GENERAL: The patient is a well-nourished male, in no acute distress. The vital signs are documented above. CARDIAC: There is a regular rate and rhythm.  PULMONARY: Non-labored respirations ABDOMEN: Soft and non-tender with normal pitched bowel sounds.  MUSCULOSKELETAL: There are no major deformities or cyanosis. NEUROLOGIC: No focal weakness or paresthesias are detected. SKIN: There are no ulcers or rashes noted. PSYCHIATRIC: The patient has a normal affect.  STUDIES:   I have evaluated his CT scan which has not been read by radiology.  He appears to have greater than 80% left carotid stenosis  MEDICAL ISSUES:   Left carotid stenosis: We discussed options for treatment either ongoing medical management, carotid endarterectomy or TCAR.  He is now 89 years old and has undergone TAVR.  He is remarkably functional and healthy.  That being said, having already undergone right carotid  endarterectomy and with his  advanced age and comorbidities, I feel that TCAR would be his best option.  We discussed the details of the procedure as well as the risks and benefits including the risk of stroke.  His wife has just returned home from the hospital with pneumonia and so he would like to wait a few weeks.  At that time we will schedule left-sided TCAR.    Marti Slates, MD, FACS [facility] [phone] Pager [phone]

## 2023-06-01 NOTE — Telephone Encounter (Signed)
 Attempted to call for surgery scheduling. LVM

## 2023-06-04 ENCOUNTER — Other Ambulatory Visit: Payer: Self-pay

## 2023-06-04 DIAGNOSIS — I6523 Occlusion and stenosis of bilateral carotid arteries: Secondary | ICD-10-CM

## 2023-07-02 ENCOUNTER — Other Ambulatory Visit: Payer: Self-pay

## 2023-07-02 MED ORDER — ROSUVASTATIN CALCIUM 40 MG PO TABS: 40.0000 mg | ORAL_TABLET | Freq: Every day | ORAL | 1 refills | Status: DC

## 2023-07-16 ENCOUNTER — Telehealth: Payer: Self-pay

## 2023-07-16 NOTE — Telephone Encounter (Signed)
 Left VM for patient informing of date change for surgery.  Sent letter with date change as well.

## 2023-08-05 ENCOUNTER — Encounter (HOSPITAL_COMMUNITY): Payer: Self-pay

## 2023-08-05 NOTE — Progress Notes (Addendum)
 Surgical Instructions   Your procedure is scheduled on Friday August 14, 2023. Report to [facility] Main Entrance A at 5:30 A.M., then check in with the Admitting office. Any questions or running late day of surgery: call [phone]  Questions prior to your surgery date: call [phone], Monday-Friday, 8am-4pm. If you experience any cold or flu symptoms such as cough, fever, chills, shortness of breath, etc. between now and your scheduled surgery, please notify us  at the above number.     Remember:  Do not eat or drink after midnight the night before your surgery  Take these medicines the morning of surgery with A SIP OF WATER  amLODipine  (NORVASC )  clopidogrel  (PLAVIX )  fexofenadine (ALLEGRA)  levothyroxine  (SYNTHROID )  metoprolol  succinate (TOPROL -XL)  rosuvastatin  (CRESTOR )   PER YOUR SURGEON'S INSTRUCTIONS YOU MAY CONTINUE TO TAKE YOUR ASPIRIN  AS PRESCRIBED.    One week prior to surgery, STOP taking any Aleve, Naproxen, Ibuprofen, Motrin, Advil, Goody's, BC's, all herbal medications, fish oil, and non-prescription vitamins.  WHAT DO I DO ABOUT MY DIABETES MEDICATION?   Do not take oral diabetes medicines (pills) the morning of surgery.   The day of surgery, do not take other diabetes injectables, including Byetta (exenatide), Bydureon (exenatide ER), Victoza (liraglutide), or Trulicity (dulaglutide).  If your CBG is greater than 220 mg/dL, you may take  of your sliding scale (correction) dose of insulin .   HOW TO MANAGE YOUR DIABETES BEFORE AND AFTER SURGERY  Why is it important to control my blood sugar before and after surgery? Improving blood sugar levels before and after surgery helps healing and can limit problems. A way of improving blood sugar control is eating a healthy diet by:  Eating less sugar and carbohydrates  Increasing activity/exercise  Talking with your doctor about reaching your blood sugar goals High blood sugars (greater than 180 mg/dL) can  raise your risk of infections and slow your recovery, so you will need to focus on controlling your diabetes during the weeks before surgery. Make sure that the doctor who takes care of your diabetes knows about your planned surgery including the date and location.  How do I manage my blood sugar before surgery? Check your blood sugar at least 4 times a day, starting 2 days before surgery, to make sure that the level is not too high or low.  Check your blood sugar the morning of your surgery when you wake up and every 2 hours until you get to the Short Stay unit.  If your blood sugar is less than 70 mg/dL, you will need to treat for low blood sugar: Do not take insulin . Treat a low blood sugar (less than 70 mg/dL) with  cup of clear juice (cranberry or apple), 4 glucose tablets, OR glucose gel. Recheck blood sugar in 15 minutes after treatment (to make sure it is greater than 70 mg/dL). If your blood sugar is not greater than 70 mg/dL on recheck, call [phone] for further instructions. Report your blood sugar to the short stay nurse when you get to Short Stay.  If you are admitted to the hospital after surgery: Your blood sugar will be checked by the staff and you will probably be given insulin  after surgery (instead of oral diabetes medicines) to make sure you have good blood sugar levels. The goal for blood sugar control after surgery is 80-180 mg/dL.                      Do NOT Smoke (  Tobacco/Vaping) for 24 hours prior to your procedure.  If you use a CPAP at night, you may bring your mask/headgear for your overnight stay.   You will be asked to remove any contacts, glasses, piercing's, hearing aid's, dentures/partials prior to surgery. Please bring cases for these items if needed.    Patients discharged the day of surgery will not be allowed to drive home, and someone needs to stay with them for 24 hours.  SURGICAL WAITING ROOM VISITATION Patients may have no more than 2 support  people in the waiting area - these visitors may rotate.   Pre-op nurse will coordinate an appropriate time for 1 ADULT support person, who may not rotate, to accompany patient in pre-op.  Children under the age of 56 must have an adult with them who is not the patient and must remain in the main waiting area with an adult.  If the patient needs to stay at the hospital during part of their recovery, the visitor guidelines for inpatient rooms apply.  Please refer to the [facility] website for the visitor guidelines for any additional information.   If you received a COVID test during your pre-op visit  it is requested that you wear a mask when out in public, stay away from anyone that may not be feeling well and notify your surgeon if you develop symptoms. If you have been in contact with anyone that has tested positive in the last 10 days please notify you surgeon.      Pre-operative CHG Bathing Instructions   You can play a key role in reducing the risk of infection after surgery. Your skin needs to be as free of germs as possible. You can reduce the number of germs on your skin by washing with CHG (chlorhexidine  gluconate) soap before surgery. CHG is an antiseptic soap that kills germs and continues to kill germs even after washing.   DO NOT use if you have an allergy to chlorhexidine /CHG or antibacterial soaps. If your skin becomes reddened or irritated, stop using the CHG and notify one of our RNs at [phone].              TAKE A SHOWER THE NIGHT BEFORE SURGERY AND THE DAY OF SURGERY    Please keep in mind the following:  You may shave your face before/day of surgery.  Place clean sheets on your bed the night before surgery Use a clean washcloth (not used since being washed) for each shower. DO NOT sleep with pet's night before surgery.  CHG Shower Instructions:  Wash your face and private area with normal soap. If you choose to wash your hair, wash first with your normal  shampoo.  After you use shampoo/soap, rinse your hair and body thoroughly to remove shampoo/soap residue.  Turn the water OFF and apply half the bottle of CHG soap to a CLEAN washcloth.  Apply CHG soap ONLY FROM YOUR NECK DOWN TO YOUR TOES (washing for 3-5 minutes)  DO NOT use CHG soap on face, private areas, open wounds, or sores.  Pay special attention to the area where your surgery is being performed.  If you are having back surgery, having someone wash your back for you may be helpful. Wait 2 minutes after CHG soap is applied, then you may rinse off the CHG soap.  Pat dry with a clean towel  Put on clean pajamas    Additional instructions for the day of surgery: DO NOT APPLY any lotions, deodorants or cologne.  Do not wear jewelry Do not bring valuables to the hospital. [facility] is not responsible for valuables/personal belongings. Put on clean/comfortable clothes.  Please brush your teeth.  Ask your nurse before applying any prescription medications to the skin.

## 2023-08-06 ENCOUNTER — Encounter (HOSPITAL_COMMUNITY): Payer: Self-pay

## 2023-08-06 ENCOUNTER — Ambulatory Visit (HOSPITAL_COMMUNITY)
Admission: RE | Admit: 2023-08-06 | Discharge: 2023-08-06 | Disposition: A | Discharge status: Home / Self Care | Payer: PPO | Source: Ambulatory Visit | Attending: Cardiovascular Disease | Admitting: Cardiovascular Disease

## 2023-08-06 ENCOUNTER — Other Ambulatory Visit: Payer: Self-pay

## 2023-08-06 ENCOUNTER — Encounter (HOSPITAL_COMMUNITY)
Admission: RE | Admit: 2023-08-06 | Discharge: 2023-08-06 | Disposition: A | Discharge status: Home / Self Care | Source: Ambulatory Visit | Attending: Cardiovascular Disease | Admitting: Cardiovascular Disease

## 2023-08-06 VITALS — BP 157/60 | HR 59 | Temp 97.7°F | Resp 17 | Ht 66.0 in | Wt 175.3 lb

## 2023-08-06 DIAGNOSIS — I6523 Occlusion and stenosis of bilateral carotid arteries: Secondary | ICD-10-CM | POA: Insufficient documentation

## 2023-08-06 DIAGNOSIS — Z01818 Encounter for other preprocedural examination: Secondary | ICD-10-CM

## 2023-08-06 DIAGNOSIS — Z952 Presence of prosthetic heart valve: Secondary | ICD-10-CM | POA: Insufficient documentation

## 2023-08-06 DIAGNOSIS — E119 Type 2 diabetes mellitus without complications: Secondary | ICD-10-CM | POA: Insufficient documentation

## 2023-08-06 HISTORY — DX: Hyperlipidemia, unspecified: E78.5

## 2023-08-06 HISTORY — DX: Type 2 diabetes mellitus without complications: E11.9

## 2023-08-06 LAB — ECHOCARDIOGRAM COMPLETE
AR max vel: 1.1 cm2
AV Area VTI: 1.12 cm2
AV Area mean vel: 1.22 cm2
AV Mean grad: 17 mmHg
AV Peak grad: 33.6 mmHg
Ao pk vel: 2.9 m/s
Area-P 1/2: 3.2 cm2
S' Lateral: 3.1 cm

## 2023-08-06 LAB — URINALYSIS, ROUTINE W REFLEX MICROSCOPIC
Bacteria, UA: NONE SEEN
Bilirubin Urine: NEGATIVE
Glucose, UA: NEGATIVE mg/dL
Hgb urine dipstick: NEGATIVE
Ketones, ur: NEGATIVE mg/dL
Leukocytes,Ua: NEGATIVE
Nitrite: NEGATIVE
Protein, ur: 100 mg/dL — AB
Specific Gravity, Urine: 1.009 (ref 1.005–1.030)
pH: 7 (ref 5.0–8.0)

## 2023-08-06 LAB — CBC
HCT: 39.5 % (ref 39.0–52.0)
Hemoglobin: 12.7 g/dL — ABNORMAL LOW (ref 13.0–17.0)
MCH: 31 pg (ref 26.0–34.0)
MCHC: 32.2 g/dL (ref 30.0–36.0)
MCV: 96.3 fL (ref 80.0–100.0)
Platelets: 170 K/uL (ref 150–400)
RBC: 4.1 MIL/uL — ABNORMAL LOW (ref 4.22–5.81)
RDW: 13 % (ref 11.5–15.5)
WBC: 6.6 K/uL (ref 4.0–10.5)
nRBC: 0 % (ref 0.0–0.2)

## 2023-08-06 LAB — COMPREHENSIVE METABOLIC PANEL WITH GFR
ALT: 13 U/L (ref 0–44)
AST: 22 U/L (ref 15–41)
Albumin: 3.9 g/dL (ref 3.5–5.0)
Alkaline Phosphatase: 53 U/L (ref 38–126)
Anion gap: 7 (ref 5–15)
BUN: 26 mg/dL — ABNORMAL HIGH (ref 8–23)
CO2: 28 mmol/L (ref 22–32)
Calcium: 9.4 mg/dL (ref 8.9–10.3)
Chloride: 104 mmol/L (ref 98–111)
Creatinine, Ser: 1.82 mg/dL — ABNORMAL HIGH (ref 0.61–1.24)
GFR, Estimated: 35 mL/min — ABNORMAL LOW (ref >60)
Glucose, Bld: 134 mg/dL — ABNORMAL HIGH (ref 70–99)
Potassium: 5 mmol/L (ref 3.5–5.1)
Sodium: 139 mmol/L (ref 135–145)
Total Bilirubin: 0.6 mg/dL (ref 0.0–1.2)
Total Protein: 7.4 g/dL (ref 6.5–8.1)

## 2023-08-06 LAB — APTT: aPTT: 29 s (ref 24–36)

## 2023-08-06 LAB — TYPE AND SCREEN
ABO/RH(D): O POS
Antibody Screen: NEGATIVE

## 2023-08-06 LAB — SURGICAL PCR SCREEN
MRSA, PCR: NEGATIVE
Staphylococcus aureus: NEGATIVE

## 2023-08-06 LAB — GLUCOSE, CAPILLARY: Glucose-Capillary: 120 mg/dL — ABNORMAL HIGH (ref 70–99)

## 2023-08-06 LAB — PROTIME-INR
INR: 1 (ref 0.8–1.2)
Prothrombin Time: 13.3 s (ref 11.4–15.2)

## 2023-08-06 NOTE — Progress Notes (Signed)
 PCP - Tinnie Forts, PA-C Cardiologist - Dr. Ozell Fell  PPM/ICD - denies   Chest x-ray - 08/09/21 EKG - 08/06/23 Stress Test - 06/06/14 ECHO - 08/06/23 Cardiac Cath - 06/05/21  Sleep Study - denies   DM- Type 2, but pt does not check CBG at home and does not know typical fasting level  Last dose of GLP1 agonist-  n/a   ASA/Blood Thinner Instructions: continue ASA and Plavix    ERAS Protcol - no, NPO   COVID TEST- n/a   Anesthesia review: yes, cardiac hx  Patient denies shortness of breath, fever, cough and chest pain at PAT appointment   All instructions explained to the patient, with a verbal understanding of the material. Patient agrees to go over the instructions while at home for a better understanding. The opportunity to ask questions was provided.

## 2023-08-07 ENCOUNTER — Ambulatory Visit: Payer: Self-pay | Admitting: Cardiovascular Disease

## 2023-08-10 NOTE — Anesthesia Preprocedure Evaluation (Signed)
 Anesthesia Evaluation  Patient identified by MRN, date of birth, ID band Patient awake    Reviewed: Allergy & Precautions, NPO status , Patient's Chart, lab work & pertinent test results, reviewed documented beta blocker date and time   History of Anesthesia Complications Negative for: history of anesthetic complications  Airway Mallampati: II  TM Distance: >3 FB Neck ROM: Full    Dental  (+) Missing, Dental Advisory Given   Pulmonary former smoker   breath sounds clear to auscultation       Cardiovascular hypertension, Pt. on medications and Pt. on home beta blockers (-) angina + CAD, + Cardiac Stents, + CABG and + Peripheral Vascular Disease  + Valvular Problems/Murmurs (now s/p TAVR)  Rhythm:Regular Rate:Normal  08/06/2023 ECHO: EF 60-65%, normal LVF, mod LVH, normal RVF, mild MR, mod TR, TAVR functioning well   Neuro/Psych negative neurological ROS     GI/Hepatic negative GI ROS, Neg liver ROS,,,  Endo/Other  diabetes (glu 123)Hypothyroidism    Renal/GU Renal InsufficiencyRenal disease     Musculoskeletal   Abdominal   Peds  Hematology Plavix  Hb 12.7, plt 170k   Anesthesia Other Findings   Reproductive/Obstetrics                              Anesthesia Physical Anesthesia Plan  ASA: 3  Anesthesia Plan: General   Post-op Pain Management: Tylenol  PO (pre-op)*   Induction: Intravenous  PONV Risk Score and Plan: 2 and Ondansetron  and Dexamethasone  Airway Management Planned: Oral ETT  Additional Equipment: Arterial line  Intra-op Plan:   Post-operative Plan: Extubation in OR  Informed Consent: I have reviewed the patients History and Physical, chart, labs and discussed the procedure including the risks, benefits and alternatives for the proposed anesthesia with the patient or authorized representative who has indicated his/her understanding and acceptance.     Dental  advisory given  Plan Discussed with: CRNA and Surgeon  Anesthesia Plan Comments: (PAT note written 08/10/2023 by Reata Petrov, PA-C.  )         Anesthesia Quick Evaluation

## 2023-08-10 NOTE — Progress Notes (Signed)
 Anesthesia Chart Review:  Case: [id number] Date/Time: 08/14/23 0715   Procedure: TRANSCAROTID ARTERY REVASCULARIZATION (TCAR) (Left)   Anesthesia type: General   Diagnosis: Bilateral carotid artery stenosis [I65.23]   Pre-op diagnosis: carotid stenosis, bilateral   [facility] ROOM [facility] / [facility] OR   Surgeons: Serene Gaile ORN, MD       DISCUSSION: Patient is an 89 year old male scheduled for the above procedure. He has 80-99% left ICA stenosis. Prior right CEA in 2018.   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 During 12/2015 hospitalization for empyema, a possible pancreatic tail mass versus normal variant was noted on CT, however repeat CT done at [facility]) showed a normal pancreas.   Last cardiology visit was with Dr. Debby Sor on 11/05/22. Patient felt well without chest pain or SOB. Was walking regularly and playing golf. Six month follow-up planned. He discussed upcoming retirement and recommended establishing with Dr. Ozell Fell who had done his TAVR. Next visit is scheduled for 08/20/23. In the meantime, he had a TTE on 08/06/23 that showed LVEF 60-65%, no RWMA, moderate ventricular VH, normal RV systolic function, mild MR, moderate TR, AV repaired/replaced, trivial AR, no AS. Dr. Sor felt results were stable.    He is to continue aspirin  and Plavix  for procedure.  Cr 1.82. By renal notes, his baseline Cr is ~ 1.5-1.9.  Anesthesia team to evaluate on the day of surgery.   VS: BP (!) 157/60   Pulse (!) 59   Temp 36.5 C   Resp 17   Ht 5' 6 (1.676 m)   Wt 79.5 kg   SpO2 96%   BMI 28.29 kg/m    PROVIDERS: Evangelina Maxwell, PA-C is PCP  Fell Ozell, MD is cardiologist, visit scheduled for 08/20/23. He previously saw Dr. Debby Sor.  Serene Gaile ORN, MD is vascular surgeon Maxene Shilling, MD is nephrologist   LABS: Labs reviewed: Acceptable for surgery. A1c 6.9% and TSH 1.902 on 07/14/23 (all labs ordered are listed, but only abnormal results are displayed)  Labs Reviewed  CBC - Abnormal; Notable for the following components:      Result Value   RBC 4.10 (*)    Hemoglobin 12.7 (*)    All other components within normal limits  COMPREHENSIVE METABOLIC PANEL WITH GFR - Abnormal; Notable for the following components:   Glucose, Bld 134 (*)    BUN 26 (*)    Creatinine, Ser 1.82 (*)    GFR, Estimated 35 (*)    All other components within normal limits  URINALYSIS, ROUTINE W REFLEX MICROSCOPIC - Abnormal; Notable for the following components:   Color, Urine STRAW (*)    Protein, ur 100 (*)    All other components within normal limits  GLUCOSE, CAPILLARY - Abnormal; Notable for the following components:   Glucose-Capillary 120 (*)    All other components within normal limits  SURGICAL PCR SCREEN  PROTIME-INR  APTT  TYPE AND SCREEN     IMAGES: CTA Neck 05/26/2023: IMPRESSION: 1. Aortic atherosclerosis. 2. Moderate stenosis at the left subclavian artery origin, estimated at 50-70%. 3. Soft and calcified plaque at the left carotid bifurcation and ICA bulb. Tortuous anatomy of the ICA bulb. I think there is severe stenosis in this  location with minimal diameter of 1 mm or less at the proximal ICA. This would be consistent with an 80% stenosis. Beyond that, the vessel is patent to the skull base but the diameter is reduced presumably secondary to inflow restriction. 4. Soft plaque at the right carotid bifurcation and ICA bulb but no stenosis. 5. Mild narrowing at the left vertebral artery origin but not greater than 30%.   EKG: 08/06/2023: Sinus bradycardia at 49 bpm with 1st degree A-V block Left bundle branch  block Abnormal ECG Since last tracing rate slower Confirmed by Raford Riggs ([id number]) on 08/06/2023 4:01:21 PM   CV: Echo 08/06/2023: IMPRESSIONS   1. Left ventricular ejection fraction, by estimation, is 60 to 65%. The  left ventricle has normal function. The left ventricle has no regional  wall motion abnormalities. There is moderate concentric left ventricular  hypertrophy. Left ventricular  diastolic function could not be evaluated.   2. Right ventricular systolic function is normal. The right ventricular  size is normal.   3. Left atrial size was mildly dilated.   4. The mitral valve is grossly normal. Mild mitral valve regurgitation.  Moderate mitral annular calcification. Inadequate mitral valve gradients  obtained.   5. Tricuspid valve regurgitation is moderate.   6. The aortic valve has been repaired/replaced. Aortic valve  regurgitation is trivial. No aortic stenosis is present. Aortic valve  area, by VTI measures 1.12 cm. Aortic valve mean gradient measures 17.0  mmHg. Aortic valve Vmax measures 2.90 m/s.   7. Aortic dilatation noted. There is borderline dilatation of the aortic  root, measuring 38 mm.   8. The inferior vena cava is normal in size with greater than 50%  respiratory variability, suggesting right atrial pressure of 3 mmHg.    US  Carotid 05/04/2023: Summary:  - Right Carotid: Velocities in the right ICA are consistent with a 1-39%  stenosis.  - Left Carotid: Velocities in the left ICA are consistent with a 80-99%  stenosis.  - Vertebrals: Bilateral vertebral arteries demonstrate antegrade flow.  - Subclavians: Normal flow hemodynamics were seen in bilateral subclavian  arteries.    Long Term Zio Monitor 08/14/2021 - 08/28/2021:   - Patient had a min HR of 25 bpm, max HR of 91 bpm, and avg HR of 59 bpm. Predominant underlying rhythm was Sinus Rhythm. First Degree AV Block was present. Second Degree AV Block-Mobitz I (Wenckebach) was present. Isolated  SVEs were frequent (8.4%,  - [id number]), SVE Couplets were rare (<1.0%, 251), and no SVE Triplets were present. Isolated VEs were occasional (1.7%, 19665), VE Couplets were rare (<1.0%, 1445), and no VE Triplets were present. - The predominant rhythm was sinus rhythm at an average rate of 59 bpm with a range of 45 to 91 bpm.  There was very rare ectopy.  No episodes of atrial fibrillation or prolonged pauses were demonstrated.    PCI 06/05/2021: Successful IVUS guided PCI of severe 90% stenosis at the ostium of the left mainstem, treated with a 4.5 x 12 mm Synergy DES.   Recommend: Overnight observation and hydration, repeat metabolic panel tomorrow morning, continue with plans for TAVR for treatment of severe symptomatic aortic stenosis.  RHC/LHC 05/14/2021:   Ost LM lesion is 95% stenosed.   Ost RCA lesion is 100% stenosed.   Prox Graft-1 lesion is 65% stenosed.   Prox Graft-2 lesion is 50% stenosed.   Prox Graft lesion is 45% stenosed.   Ost Cx to Prox Cx lesion is 50% stenosed.  and is small.   Very mildly elevated right heart pressures.   Severely calcified aortic valve with echocardiographic documentation of a mean gradient of 53 mmHg, and peak gradient 83 mmHg.     Severe native CAD with 95% eccentric ostial left main stenosis; 40% first diagonal stenosis of the LAD; 50% proximal circumflex stenosis; and ostial occlusion of the RCA.   Atretic very small caliber LIMA graft supplying the mid distal LAD.   Patent vein graft supplying the left circumflex vessel with 40 - 50% proximal third stenosis.   Patent vein graft supplying the RCA with filling of the RCA retrograde to the ostium.  There is a valve in the body of the graft with narrowing of 65 proximal and 50% distal to the probable valve.   RECOMMENDATION: The patient has a scheduled appointment to see Dr. Dorise Fellers as part of the structural heart team evaluation.  He  will undergo follow-up CT imaging per TAVR protocol.   Ultimate decision regarding SAVR with revascularization versus TAVR and left main PCI per Drs. Bartle and [employer].    Past Medical History:  Diagnosis Date   CAD (coronary artery disease)    Diabetes mellitus without complication (HCC)    History of hiatal hernia    HLD (hyperlipidemia)    Hypertension    Hypothyroidism    Pneumonia 12/2015   hx   S/P CABG x 3 12/17/2007   LIMA to LAD,SVG to left C   S/P TAVR (transcatheter aortic valve replacement) 08/13/2021   s/p TAVR with a 23 mm Edwards S3UR via the TF approach by Dr. Wonda & Dr. Fellers   Severe aortic stenosis    s/p TAVR    Past Surgical History:  Procedure Laterality Date   CORONARY ARTERY BYPASS GRAFT  12/17/07   LIMA to LAD,vein to obtuse marginal,vein to RCA   CORONARY STENT INTERVENTION N/A 06/05/2021   Procedure: CORONARY STENT INTERVENTION;  Surgeon: Wonda Sharper, MD;  Location: [facility] INVASIVE CV LAB;  Service: Cardiovascular;  Laterality: N/A;   EMPYEMA DRAINAGE Left 01/11/2016   Procedure: EMPYEMA DRAINAGE;  Surgeon: Maude Fleeta Ochoa, MD;  Location[facility] OR;  Service: Thoracic;  Laterality: Left;   ENDARTERECTOMY Right 03/26/2016   Procedure: RIGHT CAROTID ENDARTERECTOMY;  Surgeon: Gaile LELON New, MD;  Location: [facility] OR;  Service: Vascular;  Laterality: Right;   INTRAOPERATIVE TRANSTHORACIC ECHOCARDIOGRAM N/A 08/13/2021   Procedure: INTRAOPERATIVE TRANSTHORACIC ECHOCARDIOGRAM;  Surgeon: Wonda Sharper, MD;  Location: [facility] OR;  Service: Open Heart Surgery;  Laterality: N/A;   PATCH ANGIOPLASTY Right 03/26/2016   Procedure: PATCH ANGIOPLASTY USING GEORGE BIOLOGIC PATCH;  Surgeon: Gaile LELON New, MD;  Location: [facility] OR;  Service: Vascular;  Laterality: Right;   RIGHT HEART CATH AND CORONARY/GRAFT ANGIOGRAPHY N/A 05/14/2021   Procedure: RIGHT HEART CATH AND CORONARY/GRAFT ANGIOGRAPHY;  Surgeon: Burnard Debby LABOR, MD;  Location: [facility] INVASIVE CV LAB;  Service: Cardiovascular;  Laterality: N/A;   TRANSCATHETER AORTIC VALVE  REPLACEMENT, TRANSFEMORAL N/A 08/13/2021   Procedure: Transcatheter Aortic Valve Replacement, Transfemoral;  Surgeon: Wonda Sharper, MD;  Location: [facility] OR;  Service: Open Heart Surgery;  Laterality: N/A;   ULTRASOUND GUIDANCE FOR VASCULAR ACCESS Bilateral 08/13/2021   Procedure: ULTRASOUND GUIDANCE FOR VASCULAR ACCESS, BILATERAL FEMORAL ARTERIES AND LEFT FEMORAL VEIN;  Surgeon: Wonda Sharper, MD;  Location: [facility] OR;  Service: Open Heart Surgery;  Laterality: Bilateral;   VIDEO ASSISTED THORACOSCOPY (VATS)/DECORTICATION Left 01/11/2016   Procedure: VIDEO ASSISTED THORACOSCOPY (VATS)/DECORTICATION;  Surgeon: Maude Fleeta Ochoa, MD;  Location: [facility]  OR;  Service: Thoracic;  Laterality: Left;    MEDICATIONS:  amLODipine  (NORVASC ) 10 MG tablet   aspirin  EC 81 MG tablet   Cholecalciferol (VITAMIN D) 2000 units tablet   clopidogrel  (PLAVIX ) 75 MG tablet   Cyanocobalamin (B-12) 2000 MCG TABS   fexofenadine (ALLEGRA) 180 MG tablet   folic acid  (FOLVITE ) 1 MG tablet   furosemide  (LASIX ) 20 MG tablet   imiquimod (ALDARA) 5 % cream   levothyroxine  (SYNTHROID ) 125 MCG tablet   metoprolol  succinate (TOPROL -XL) 50 MG 24 hr tablet   mupirocin ointment (BACTROBAN) 2 %   Omega-3 1400 MG CAPS   rosuvastatin  (CRESTOR ) 40 MG tablet   terbinafine (LAMISIL) 1 % cream   No current facility-administered medications for this encounter.    Isaiah Ruder, PA-C Surgical Short Stay/Anesthesiology [facility] Phone [phone] [facility] Phone [phone] 08/10/2023 5:00 PM

## 2023-08-14 ENCOUNTER — Inpatient Hospital Stay (HOSPITAL_COMMUNITY): Payer: Self-pay | Admitting: Vascular Surgery

## 2023-08-14 ENCOUNTER — Other Ambulatory Visit: Payer: Self-pay

## 2023-08-14 ENCOUNTER — Encounter (HOSPITAL_COMMUNITY)
Admission: RE | Disposition: A | Discharge status: Home / Self Care | Payer: Self-pay | Source: Home / Self Care | Attending: Surgery

## 2023-08-14 ENCOUNTER — Encounter (HOSPITAL_COMMUNITY): Payer: Self-pay | Admitting: Surgery

## 2023-08-14 ENCOUNTER — Inpatient Hospital Stay (HOSPITAL_COMMUNITY)
Admission: RE | Admit: 2023-08-14 | Discharge: 2023-08-15 | DRG: 036 | Disposition: A | Discharge status: Home / Self Care | Attending: Surgery | Admitting: Surgery

## 2023-08-14 ENCOUNTER — Inpatient Hospital Stay (HOSPITAL_COMMUNITY): Payer: Self-pay | Admitting: Registered Nurse

## 2023-08-14 ENCOUNTER — Inpatient Hospital Stay (HOSPITAL_COMMUNITY)

## 2023-08-14 DIAGNOSIS — Z7902 Long term (current) use of antithrombotics/antiplatelets: Secondary | ICD-10-CM

## 2023-08-14 DIAGNOSIS — I6522 Occlusion and stenosis of left carotid artery: Secondary | ICD-10-CM

## 2023-08-14 DIAGNOSIS — E119 Type 2 diabetes mellitus without complications: Secondary | ICD-10-CM | POA: Diagnosis present

## 2023-08-14 DIAGNOSIS — Z951 Presence of aortocoronary bypass graft: Secondary | ICD-10-CM | POA: Diagnosis not present

## 2023-08-14 DIAGNOSIS — Z9889 Other specified postprocedural states: Principal | ICD-10-CM

## 2023-08-14 DIAGNOSIS — Z952 Presence of prosthetic heart valve: Secondary | ICD-10-CM

## 2023-08-14 DIAGNOSIS — I10 Essential (primary) hypertension: Secondary | ICD-10-CM

## 2023-08-14 DIAGNOSIS — I2510 Atherosclerotic heart disease of native coronary artery without angina pectoris: Secondary | ICD-10-CM | POA: Diagnosis present

## 2023-08-14 DIAGNOSIS — Z87891 Personal history of nicotine dependence: Secondary | ICD-10-CM

## 2023-08-14 DIAGNOSIS — Z79890 Hormone replacement therapy (postmenopausal): Secondary | ICD-10-CM

## 2023-08-14 DIAGNOSIS — Z8701 Personal history of pneumonia (recurrent): Secondary | ICD-10-CM | POA: Diagnosis not present

## 2023-08-14 DIAGNOSIS — E785 Hyperlipidemia, unspecified: Secondary | ICD-10-CM | POA: Diagnosis present

## 2023-08-14 DIAGNOSIS — Z8249 Family history of ischemic heart disease and other diseases of the circulatory system: Secondary | ICD-10-CM | POA: Diagnosis not present

## 2023-08-14 DIAGNOSIS — Z7982 Long term (current) use of aspirin: Secondary | ICD-10-CM

## 2023-08-14 DIAGNOSIS — E039 Hypothyroidism, unspecified: Secondary | ICD-10-CM | POA: Diagnosis present

## 2023-08-14 DIAGNOSIS — I6523 Occlusion and stenosis of bilateral carotid arteries: Principal | ICD-10-CM | POA: Diagnosis present

## 2023-08-14 DIAGNOSIS — Z79899 Other long term (current) drug therapy: Secondary | ICD-10-CM | POA: Diagnosis not present

## 2023-08-14 HISTORY — PX: TRANSCAROTID ARTERY REVASCULARIZATIONÂ: SHX6778

## 2023-08-14 HISTORY — PX: ULTRASOUND GUIDANCE FOR VASCULAR ACCESS: SHX6516

## 2023-08-14 LAB — POCT ACTIVATED CLOTTING TIME: Activated Clotting Time: 273 s

## 2023-08-14 LAB — CBC
HCT: 30.8 % — ABNORMAL LOW (ref 39.0–52.0)
Hemoglobin: 10.3 g/dL — ABNORMAL LOW (ref 13.0–17.0)
MCH: 32.4 pg (ref 26.0–34.0)
MCHC: 33.4 g/dL (ref 30.0–36.0)
MCV: 96.9 fL (ref 80.0–100.0)
Platelets: 143 K/uL — ABNORMAL LOW (ref 150–400)
RBC: 3.18 MIL/uL — ABNORMAL LOW (ref 4.22–5.81)
RDW: 12.8 % (ref 11.5–15.5)
WBC: 6 K/uL (ref 4.0–10.5)
nRBC: 0 % (ref 0.0–0.2)

## 2023-08-14 LAB — CREATININE, SERUM
Creatinine, Ser: 1.58 mg/dL — ABNORMAL HIGH (ref 0.61–1.24)
GFR, Estimated: 42 mL/min — ABNORMAL LOW (ref >60)

## 2023-08-14 LAB — GLUCOSE, CAPILLARY
Glucose-Capillary: 123 mg/dL — ABNORMAL HIGH (ref 70–99)
Glucose-Capillary: 149 mg/dL — ABNORMAL HIGH (ref 70–99)

## 2023-08-14 SURGERY — TRANSCAROTID ARTERY REVASCULARIZATION (TCAR)
Anesthesia: General | Site: Groin | Laterality: Right

## 2023-08-14 MED ORDER — OXYCODONE HCL 5 MG PO TABS: 5.0000 mg | ORAL_TABLET | Freq: Once | ORAL | Status: DC | PRN

## 2023-08-14 MED ORDER — OXYCODONE HCL 5 MG/5ML PO SOLN: 5.0000 mg | Freq: Once | ORAL | Status: DC | PRN

## 2023-08-14 MED ORDER — LIDOCAINE HCL (PF) 1 % IJ SOLN
INTRAMUSCULAR | Status: AC
  Filled 2023-08-14: qty 30

## 2023-08-14 MED ORDER — PHENYLEPHRINE HCL-NACL 20-0.9 MG/250ML-% IV SOLN
INTRAVENOUS | Status: DC | PRN
  Administered 2023-08-14: 40 ug/min via INTRAVENOUS

## 2023-08-14 MED ORDER — HYDRALAZINE HCL 20 MG/ML IJ SOLN: 5.0000 mg | INTRAMUSCULAR | Status: DC | PRN

## 2023-08-14 MED ORDER — LACTATED RINGERS IV SOLN: INTRAVENOUS | Status: DC

## 2023-08-14 MED ORDER — CHLORHEXIDINE GLUCONATE CLOTH 2 % EX PADS: 6.0000 | MEDICATED_PAD | Freq: Once | CUTANEOUS | Status: DC

## 2023-08-14 MED ORDER — GLYCOPYRROLATE PF 0.2 MG/ML IJ SOSY
PREFILLED_SYRINGE | INTRAMUSCULAR | Status: DC | PRN
  Administered 2023-08-14: .2 mg via INTRAVENOUS

## 2023-08-14 MED ORDER — ASPIRIN 81 MG PO TBEC: 81.0000 mg | DELAYED_RELEASE_TABLET | Freq: Every day | ORAL | Status: DC

## 2023-08-14 MED ORDER — FENTANYL CITRATE (PF) 250 MCG/5ML IJ SOLN
INTRAMUSCULAR | Status: DC | PRN
  Administered 2023-08-14: 100 ug via INTRAVENOUS
  Administered 2023-08-14: 50 ug via INTRAVENOUS

## 2023-08-14 MED ORDER — SODIUM CHLORIDE 0.9% FLUSH
3.0000 mL | INTRAVENOUS | Status: DC | PRN
Start: 2023-08-14 — End: 2023-08-15

## 2023-08-14 MED ORDER — PROPOFOL 10 MG/ML IV BOLUS
INTRAVENOUS | Status: AC
  Filled 2023-08-14: qty 20

## 2023-08-14 MED ORDER — CEFAZOLIN SODIUM-DEXTROSE 2-4 GM/100ML-% IV SOLN
2.0000 g | INTRAVENOUS | Status: AC
  Administered 2023-08-14: 2 g via INTRAVENOUS
  Filled 2023-08-14: qty 100

## 2023-08-14 MED ORDER — TRAMADOL HCL 50 MG PO TABS: 50.0000 mg | ORAL_TABLET | Freq: Four times a day (QID) | ORAL | Status: DC | PRN

## 2023-08-14 MED ORDER — LABETALOL HCL 5 MG/ML IV SOLN: 10.0000 mg | INTRAVENOUS | Status: DC | PRN

## 2023-08-14 MED ORDER — FENTANYL CITRATE (PF) 250 MCG/5ML IJ SOLN
INTRAMUSCULAR | Status: AC
  Filled 2023-08-14: qty 5

## 2023-08-14 MED ORDER — SODIUM CHLORIDE 0.9 % IV SOLN: INTRAVENOUS | Status: DC

## 2023-08-14 MED ORDER — ONDANSETRON HCL 4 MG/2ML IJ SOLN: 4.0000 mg | Freq: Four times a day (QID) | INTRAMUSCULAR | Status: DC | PRN

## 2023-08-14 MED ORDER — DEXAMETHASONE SODIUM PHOSPHATE 10 MG/ML IJ SOLN
INTRAMUSCULAR | Status: DC | PRN
  Administered 2023-08-14: 5 mg via INTRAVENOUS

## 2023-08-14 MED ORDER — POTASSIUM CHLORIDE CRYS ER 20 MEQ PO TBCR: 40.0000 meq | EXTENDED_RELEASE_TABLET | Freq: Every day | ORAL | Status: DC | PRN

## 2023-08-14 MED ORDER — MIDAZOLAM HCL 2 MG/2ML IJ SOLN: 0.5000 mg | Freq: Once | INTRAMUSCULAR | Status: DC | PRN

## 2023-08-14 MED ORDER — METOPROLOL TARTRATE 5 MG/5ML IV SOLN: 2.5000 mg | INTRAVENOUS | Status: DC | PRN

## 2023-08-14 MED ORDER — ALBUMIN HUMAN 5 % IV SOLN
12.5000 g | Freq: Once | INTRAVENOUS | Status: AC
  Administered 2023-08-14: 12.5 g via INTRAVENOUS

## 2023-08-14 MED ORDER — SODIUM CHLORIDE 0.9 % IV SOLN
0.1500 ug/kg/min | Freq: Once | INTRAVENOUS | Status: DC
  Filled 2023-08-14: qty 2000

## 2023-08-14 MED ORDER — LIDOCAINE 2% (20 MG/ML) 5 ML SYRINGE
INTRAMUSCULAR | Status: AC
  Filled 2023-08-14: qty 5

## 2023-08-14 MED ORDER — ALBUMIN HUMAN 5 % IV SOLN
INTRAVENOUS | Status: AC
  Filled 2023-08-14: qty 250

## 2023-08-14 MED ORDER — EPHEDRINE 5 MG/ML INJ
INTRAVENOUS | Status: AC
  Filled 2023-08-14: qty 5

## 2023-08-14 MED ORDER — ORAL CARE MOUTH RINSE: 15.0000 mL | Freq: Once | OROMUCOSAL | Status: AC

## 2023-08-14 MED ORDER — SODIUM CHLORIDE 0.9% FLUSH
3.0000 mL | Freq: Two times a day (BID) | INTRAVENOUS | Status: DC
  Administered 2023-08-14 – 2023-08-15 (×3): 3 mL via INTRAVENOUS

## 2023-08-14 MED ORDER — HEPARIN 6000 UNIT IRRIGATION SOLUTION
Status: DC | PRN
  Administered 2023-08-14: 1

## 2023-08-14 MED ORDER — HEPARIN 6000 UNIT IRRIGATION SOLUTION
Status: AC
  Filled 2023-08-14: qty 500

## 2023-08-14 MED ORDER — ACETAMINOPHEN 325 MG PO TABS: 325.0000 mg | ORAL_TABLET | ORAL | Status: DC | PRN

## 2023-08-14 MED ORDER — ROSUVASTATIN CALCIUM 20 MG PO TABS
40.0000 mg | ORAL_TABLET | Freq: Every day | ORAL | Status: DC
  Administered 2023-08-15: 40 mg via ORAL
  Filled 2023-08-14: qty 2

## 2023-08-14 MED ORDER — ACETAMINOPHEN 500 MG PO TABS
1000.0000 mg | ORAL_TABLET | Freq: Once | ORAL | Status: AC
  Administered 2023-08-14: 1000 mg via ORAL
  Filled 2023-08-14: qty 2

## 2023-08-14 MED ORDER — LORATADINE 10 MG PO TABS
10.0000 mg | ORAL_TABLET | Freq: Every day | ORAL | Status: DC
  Administered 2023-08-15: 10 mg via ORAL
  Filled 2023-08-14: qty 1

## 2023-08-14 MED ORDER — IODIXANOL 320 MG/ML IV SOLN
INTRAVENOUS | Status: DC | PRN
  Administered 2023-08-14: 32 mL via INTRA_ARTERIAL

## 2023-08-14 MED ORDER — SODIUM CHLORIDE 0.9 % IV SOLN
250.0000 mL | INTRAVENOUS | Status: DC | PRN
Start: 2023-08-14 — End: 2023-08-15

## 2023-08-14 MED ORDER — CHLORHEXIDINE GLUCONATE 0.12 % MT SOLN
15.0000 mL | Freq: Once | OROMUCOSAL | Status: AC
  Administered 2023-08-14: 15 mL via OROMUCOSAL
  Filled 2023-08-14: qty 15

## 2023-08-14 MED ORDER — SODIUM CHLORIDE 0.9 % IV SOLN: 500.0000 mL | Freq: Once | INTRAVENOUS | Status: DC | PRN

## 2023-08-14 MED ORDER — LIDOCAINE 2% (20 MG/ML) 5 ML SYRINGE
INTRAMUSCULAR | Status: DC | PRN
  Administered 2023-08-14: 20 mg via INTRAVENOUS

## 2023-08-14 MED ORDER — CEFAZOLIN SODIUM-DEXTROSE 2-4 GM/100ML-% IV SOLN
2.0000 g | Freq: Three times a day (TID) | INTRAVENOUS | Status: AC
  Administered 2023-08-14 (×2): 2 g via INTRAVENOUS
  Filled 2023-08-14 (×2): qty 100

## 2023-08-14 MED ORDER — SODIUM CHLORIDE 0.9 % IV SOLN
0.1500 ug/kg/min | INTRAVENOUS | Status: DC
  Administered 2023-08-14: .05 ug/kg/min via INTRAVENOUS
  Filled 2023-08-14: qty 2000

## 2023-08-14 MED ORDER — PROPOFOL 10 MG/ML IV BOLUS
INTRAVENOUS | Status: DC | PRN
  Administered 2023-08-14: 125 ug/kg/min via INTRAVENOUS
  Administered 2023-08-14: 100 mg via INTRAVENOUS
  Administered 2023-08-14: 125 ug/kg/min via INTRAVENOUS

## 2023-08-14 MED ORDER — CLOPIDOGREL BISULFATE 75 MG PO TABS
75.0000 mg | ORAL_TABLET | Freq: Every day | ORAL | Status: DC
  Administered 2023-08-15: 75 mg via ORAL
  Filled 2023-08-14: qty 1

## 2023-08-14 MED ORDER — EPHEDRINE SULFATE-NACL 50-0.9 MG/10ML-% IV SOSY
PREFILLED_SYRINGE | INTRAVENOUS | Status: DC | PRN
  Administered 2023-08-14: 10 mg via INTRAVENOUS

## 2023-08-14 MED ORDER — ACETAMINOPHEN 650 MG RE SUPP: 325.0000 mg | RECTAL | Status: DC | PRN

## 2023-08-14 MED ORDER — LEVOTHYROXINE SODIUM 25 MCG PO TABS
125.0000 ug | ORAL_TABLET | Freq: Every day | ORAL | Status: DC
  Administered 2023-08-15: 125 ug via ORAL
  Filled 2023-08-14: qty 1

## 2023-08-14 MED ORDER — ROCURONIUM BROMIDE 10 MG/ML (PF) SYRINGE
PREFILLED_SYRINGE | INTRAVENOUS | Status: AC
  Filled 2023-08-14: qty 10

## 2023-08-14 MED ORDER — ONDANSETRON HCL 4 MG/2ML IJ SOLN
INTRAMUSCULAR | Status: DC | PRN
  Administered 2023-08-14: 4 mg via INTRAVENOUS

## 2023-08-14 MED ORDER — BISACODYL 5 MG PO TBEC: 5.0000 mg | DELAYED_RELEASE_TABLET | Freq: Every day | ORAL | Status: DC | PRN

## 2023-08-14 MED ORDER — AMLODIPINE BESYLATE 10 MG PO TABS
10.0000 mg | ORAL_TABLET | Freq: Every day | ORAL | Status: DC
Start: 2023-08-15 — End: 2023-08-15
  Administered 2023-08-15: 10 mg via ORAL
  Filled 2023-08-14: qty 1

## 2023-08-14 MED ORDER — SUGAMMADEX SODIUM 200 MG/2ML IV SOLN
INTRAVENOUS | Status: DC | PRN
  Administered 2023-08-14: 350 mg via INTRAVENOUS

## 2023-08-14 MED ORDER — DOCUSATE SODIUM 100 MG PO CAPS
100.0000 mg | ORAL_CAPSULE | Freq: Every day | ORAL | Status: DC
  Filled 2023-08-14: qty 1

## 2023-08-14 MED ORDER — HYDROMORPHONE HCL 1 MG/ML IJ SOLN: 0.5000 mg | INTRAMUSCULAR | Status: DC | PRN

## 2023-08-14 MED ORDER — FENTANYL CITRATE (PF) 100 MCG/2ML IJ SOLN: 25.0000 ug | INTRAMUSCULAR | Status: DC | PRN

## 2023-08-14 MED ORDER — SENNOSIDES-DOCUSATE SODIUM 8.6-50 MG PO TABS: 1.0000 | ORAL_TABLET | Freq: Every evening | ORAL | Status: DC | PRN

## 2023-08-14 MED ORDER — PHENOL 1.4 % MT LIQD: 1.0000 | OROMUCOSAL | Status: DC | PRN

## 2023-08-14 MED ORDER — HEPARIN SODIUM (PORCINE) 1000 UNIT/ML IJ SOLN
INTRAMUSCULAR | Status: DC | PRN
  Administered 2023-08-14: 8000 [IU] via INTRAVENOUS

## 2023-08-14 MED ORDER — 0.9 % SODIUM CHLORIDE (POUR BTL) OPTIME
TOPICAL | Status: DC | PRN
  Administered 2023-08-14: 1000 mL

## 2023-08-14 MED ORDER — HEMOSTATIC AGENTS (NO CHARGE) OPTIME
TOPICAL | Status: DC | PRN
  Administered 2023-08-14: 1 via TOPICAL

## 2023-08-14 MED ORDER — ROCURONIUM BROMIDE 10 MG/ML (PF) SYRINGE
PREFILLED_SYRINGE | INTRAVENOUS | Status: DC | PRN
  Administered 2023-08-14: 10 mg via INTRAVENOUS
  Administered 2023-08-14: 60 mg via INTRAVENOUS

## 2023-08-14 MED ORDER — HEPARIN SODIUM (PORCINE) 5000 UNIT/ML IJ SOLN
5000.0000 [IU] | Freq: Three times a day (TID) | INTRAMUSCULAR | Status: DC
  Administered 2023-08-15: 5000 [IU] via SUBCUTANEOUS
  Filled 2023-08-14: qty 1

## 2023-08-14 MED ORDER — PROTAMINE SULFATE 10 MG/ML IV SOLN
INTRAVENOUS | Status: DC | PRN
Start: 2023-08-14 — End: 2023-08-14
  Administered 2023-08-14: 10 mg via INTRAVENOUS
  Administered 2023-08-14 (×2): 20 mg via INTRAVENOUS

## 2023-08-14 SURGICAL SUPPLY — 43 items
BAG BANDED W/RUBBER/TAPE 36X54 (MISCELLANEOUS) ×2 IMPLANT
BAG COUNTER SPONGE SURGICOUNT (BAG) ×2 IMPLANT
CANISTER SUCTION 3000ML PPV (SUCTIONS) ×2 IMPLANT
CATH BALLN ENROUTE 6X25 (CATHETERS) IMPLANT
CATH BEACON 5 .035 40 KMP TP (CATHETERS) IMPLANT
CATH SUCT 10FR WHISTLE TIP (CATHETERS) ×2 IMPLANT
CLIP TI MEDIUM 6 (CLIP) ×2 IMPLANT
CLIP TI WIDE RED SMALL 6 (CLIP) ×2 IMPLANT
COVER DOME SNAP 22 D (MISCELLANEOUS) ×2 IMPLANT
COVER PROBE W GEL 5X96 (DRAPES) ×2 IMPLANT
DERMABOND ADVANCED .7 DNX12 (GAUZE/BANDAGES/DRESSINGS) ×2 IMPLANT
DRAPE FEMORAL ANGIO 80X135IN (DRAPES) ×2 IMPLANT
ELECTRODE REM PT RTRN 9FT ADLT (ELECTROSURGICAL) ×2 IMPLANT
GAUZE KITTNER 1.5X5 (MISCELLANEOUS) IMPLANT
GLOVE SURG SS PI 7.5 STRL IVOR (GLOVE) ×6 IMPLANT
GOWN STRL REUS W/ TWL LRG LVL3 (GOWN DISPOSABLE) ×4 IMPLANT
GOWN STRL REUS W/ TWL XL LVL3 (GOWN DISPOSABLE) ×2 IMPLANT
GUIDEWIRE ENROUTE 0.014 (WIRE) ×2 IMPLANT
HEMOSTAT SNOW SURGICEL 2X4 (HEMOSTASIS) IMPLANT
KIT BASIN OR (CUSTOM PROCEDURE TRAY) ×2 IMPLANT
KIT ENCORE 26 ADVANTAGE (KITS) ×2 IMPLANT
KIT INTRODUCER GALT 7 (INTRODUCER) ×2 IMPLANT
KIT TURNOVER KIT B (KITS) ×2 IMPLANT
NDL HYPO 25GX1X1/2 BEV (NEEDLE) IMPLANT
NEEDLE HYPO 25GX1X1/2 BEV (NEEDLE) IMPLANT
PACK CAROTID (CUSTOM PROCEDURE TRAY) ×2 IMPLANT
POSITIONER HEAD DONUT 9IN (MISCELLANEOUS) ×2 IMPLANT
SET MICROPUNCTURE 5F STIFF (MISCELLANEOUS) ×2 IMPLANT
STENT TRANSCAROTID SYSTEM 9X40 (Permanent Stent) IMPLANT
SURGIFLO W/THROMBIN 8M KIT (HEMOSTASIS) IMPLANT
SUT PROLENE 5 0 C 1 24 (SUTURE) ×4 IMPLANT
SUT SILK 2 0 PERMA HAND 18 BK (SUTURE) ×2 IMPLANT
SUT SILK 2 0 SH (SUTURE) ×2 IMPLANT
SUT VIC AB 3-0 SH 27X BRD (SUTURE) ×4 IMPLANT
SUT VIC AB 4-0 PS2 27 (SUTURE) ×2 IMPLANT
SYR 10ML LL (SYRINGE) ×6 IMPLANT
SYR 20ML LL LF (SYRINGE) ×2 IMPLANT
SYR CONTROL 10ML LL (SYRINGE) IMPLANT
SYSTEM TRANSCAROTID NEUROPRTCT (MISCELLANEOUS) ×2 IMPLANT
TOWEL GREEN STERILE (TOWEL DISPOSABLE) ×2 IMPLANT
WATER STERILE IRR 1000ML POUR (IV SOLUTION) ×2 IMPLANT
WIRE BENTSON .035X145CM (WIRE) ×2 IMPLANT
WIRE TORQFLEX AUST .018X40CM (WIRE) IMPLANT

## 2023-08-14 NOTE — H&P (Signed)
 V[facility] and [facility]in Specialist of [facility]   Patient name: Dillon [name]    MRN: [id number]        DOB: [birth year]            Sex: male     REASON FOR VISIT:      Follow up   HISOTRY OF PRESENT ILLNESS:      Dillon [name] is a 89 y.o. male who is status post right carotid endarterectomy on 03/26/2016 for asymptomatic stenosis.  Intraoperative findings included a heavily calcified lesion with approximately 95% stenosis.  There was significant redundancy in the internal carotid artery and therefore resection with primary anastomosis of the internal carotid artery was performed.    His most recent duplex suggested greater than 80% left-sided stenosis and so he was sent for CT scan.  He is back today to discuss these results.  He is without neurologic issues.   He has a history of coronary artery disease status post CABG.  He is also undergone TAVR.  He is medically managed for hypertension with an ARB.  He is a former smoker.  He takes a statin for hypercholesterolemia.   PAST MEDICAL HISTORY:        Past Medical History:  Diagnosis Date   CAD (coronary artery disease)     History of hiatal hernia     Hypertension     Hypothyroidism     Pneumonia 12/2015    hx   S/P CABG x 3 12/17/2007    LIMA to LAD,SVG to left C   S/P TAVR (transcatheter aortic valve replacement) 08/13/2021    s/p TAVR with a 23 mm Edwards S3UR via the TF approach by Dr. Wonda & Dr. Lucas   Severe aortic stenosis      s/p TAVR            FAMILY HISTORY:         Family History  Problem Relation Age of Onset   Heart attack Father            SOCIAL HISTORY:    Social History         Tobacco Use   Smoking status: Former      Current packs/day: 0.00      Types: Cigarettes      Quit date: 01/28/1971      Years since quitting: 52.3      Passive exposure: Never   Smokeless tobacco: Never  Substance Use Topics   Alcohol use: Yes      Alcohol/week: 14.0 standard drinks of alcohol      Types: 14  Standard drinks or equivalent per week      Comment: 1-2 drinks per day (mixture of different types)        ALLERGIES:    Allergies  No Known Allergies       CURRENT MEDICATIONS:          Current Outpatient Medications  Medication Sig Dispense Refill   amLODipine  (NORVASC ) 10 MG tablet Take 1 tablet (10 mg total) by mouth daily. 180 tablet 3   aspirin  EC 81 MG tablet Take 81 mg by mouth at bedtime.        Cholecalciferol (VITAMIN D) 2000 units tablet Take 2,000 Units by mouth at bedtime.       clopidogrel  (PLAVIX ) 75 MG tablet Take 1 tablet (75 mg total) by mouth daily. 90 tablet 3   Cyanocobalamin (B-12) 2000 MCG TABS Take 2,000 mcg by mouth daily.  fexofenadine (ALLEGRA) 180 MG tablet Take 180 mg by mouth daily.       folic acid  (FOLVITE ) 1 MG tablet Take 1 tablet (1 mg total) by mouth daily. 90 tablet 3   furosemide  (LASIX ) 20 MG tablet Take 1 tablet (20 mg total) by mouth daily. 90 tablet 2   imiquimod (ALDARA) 5 % cream Apply 1 Application topically at bedtime.       levothyroxine  (SYNTHROID , LEVOTHROID) 112 MCG tablet Take 120 mcg by mouth daily before breakfast.       metoprolol  succinate (TOPROL -XL) 50 MG 24 hr tablet Take 1 tablet (50 mg total) by mouth daily. Take with or immediately following a meal. 90 tablet 2   mupirocin cream (BACTROBAN) 2 % Apply 1 application topically daily as needed (after skin treatments).        mupirocin ointment (BACTROBAN) 2 % Apply 1 Application topically 2 (two) times daily.       Omega-3 1400 MG CAPS Take 1,400 mg by mouth daily.       rosuvastatin  (CRESTOR ) 40 MG tablet Take 1 tablet (40 mg total) by mouth daily. 90 tablet 3   terbinafine (LAMISIL) 1 % cream Apply 1 application topically daily as needed (rash).           No current facility-administered medications for this visit.        REVIEW OF SYSTEMS:    [X]  denotes positive finding, [ ]  denotes negative finding Cardiac   Comments:  Chest pain or chest pressure:       Shortness of breath upon exertion:      Short of breath when lying flat:      Irregular heart rhythm:             Vascular      Pain in calf, thigh, or hip brought on by ambulation:      Pain in feet at night that wakes you up from your sleep:       Blood clot in your veins:      Leg swelling:              Pulmonary      Oxygen at home:      Productive cough:       Wheezing:              Neurologic      Sudden weakness in arms or legs:       Sudden numbness in arms or legs:       Sudden onset of difficulty speaking or slurred speech:      Temporary loss of vision in one eye:       Problems with dizziness:              Gastrointestinal      Blood in stool:       Vomited blood:              Genitourinary      Burning when urinating:       Blood in urine:             Psychiatric      Major depression:              Hematologic      Bleeding problems:      Problems with blood clotting too easily:             Skin      Rashes or ulcers:  Constitutional      Fever or chills:          PHYSICAL EXAM:        Vitals:    06/01/23 1345 06/01/23 1347  BP: (!) 151/72 (!) 156/76  Pulse: 64    Resp: 20    Temp: 98.2 F (36.8 C)    SpO2: 92%    Weight: 178 lb (80.7 kg)    Height: 5' 6 (1.676 m)        GENERAL: The patient is a well-nourished male, in no acute distress. The vital signs are documented above. CARDIAC: There is a regular rate and rhythm.  PULMONARY: Non-labored respirations ABDOMEN: Soft and non-tender with normal pitched bowel sounds.  MUSCULOSKELETAL: There are no major deformities or cyanosis. NEUROLOGIC: No focal weakness or paresthesias are detected. SKIN: There are no ulcers or rashes noted. PSYCHIATRIC: The patient has a normal affect.   STUDIES:    I have evaluated his CT scan which has not been read by radiology.  He appears to have greater than 80% left carotid stenosis   MEDICAL ISSUES:    Left carotid stenosis: We  discussed options for treatment either ongoing medical management, carotid endarterectomy or TCAR.  He is now 89 years old and has undergone TAVR.  He is remarkably functional and healthy.  That being said, having already undergone right carotid endarterectomy and with his advanced age and comorbidities, I feel that TCAR would be his best option.  We discussed the details of the procedure as well as the risks and benefits including the risk of stroke.  His wife has just returned home from the hospital with pneumonia and so he would like to wait a few weeks.  At that time we will schedule left-sided TCAR.       Malvina Serene CLORE, MD, FACS [facility]ists of [facility] [phone] Pager [phone]

## 2023-08-14 NOTE — Plan of Care (Signed)
  Problem: Education: Goal: Knowledge of General Education information will improve Description: Including pain rating scale, medication(s)/side effects and non-pharmacologic comfort measures Outcome: Progressing   Problem: Health Behavior/Discharge Planning: Goal: Ability to manage health-related needs will improve Outcome: Progressing   Problem: Clinical Measurements: Goal: Ability to maintain clinical measurements within normal limits will improve Outcome: Progressing Goal: Will remain free from infection Outcome: Progressing Goal: Diagnostic test results will improve Outcome: Progressing Goal: Respiratory complications will improve Outcome: Progressing Goal: Cardiovascular complication will be avoided Outcome: Progressing   Problem: Ischemic Stroke/TIA Tissue Perfusion: Goal: Complications of ischemic stroke/TIA will be minimized Outcome: Progressing   Problem: Coping: Goal: Will verbalize positive feelings about self Outcome: Progressing Goal: Will identify appropriate support needs Outcome: Progressing

## 2023-08-14 NOTE — Op Note (Signed)
 Patient name: Dillon [name] MRN: [id number] DOB: [birth year] Sex: male  08/14/2023 Pre-operative Diagnosis: Asymptomatic left carotid stenosis Post-operative diagnosis:  Same Surgeon:  Malvina New Assistants:  KYM Damme, PA Procedure:   #1: Left carotid stent (TCAR)   #2: Retrograde flow reversal neuroprotection   #3: Open exposure left carotid artery    #4: Ultrasound-guided access, right femoral vein \\Anesthesia :  general Blood Loss:  minimal Specimens:  none  Findings: 95% stenosis Status:  ENROUTE 9 x 40 Predilation balloon: 6 x 2.5 Postdilation balloon: 6 x 2.5  flow reversal time: 26 minutes Procedure time: 71 minutes Contrast: 55 cc Fluoroscopy: 9.7 minutes Dose area 3142.12  Indications: This is an 89 year old gentleman with history of right carotid endarterectomy a high-grade left carotid stenosis.  Today for TCAR  Procedure:  The patient was identified in the holding area and taken to [facility] OR ROOM 16  The patient was then placed supine on the table. general anesthesia was administered.  The patient was prepped and draped in the usual sterile fashion.  A time out was called and antibiotics were administered.  A PA was necessary to expedite the procedure and assist with technical details.  She helped with groin access, wire exchanges, suture following, and sheath removal  Ultrasound was used to evaluate the right common femoral artery was cannulated under ultrasound guidance with micropuncture needle.  A 018 wire was inserted without resistance followed placement of micropuncture sheath.  Over a Bentson wire, the TCAR sheath was placed.  A transverse incision was made clavicle.  Cautery was used to subcutaneous tissue muscle.  I then developed a avascular plane between the 2 heads of the sternocleidomastoid.  The vagus nerve was visualized and protected along with the internal jugular vein.  The common carotid artery was then circumferentially exposed.  It was a disease-free  artery.  The patient was fully heparinized.  A 5-0 Prolene U-stitch was placed in the adventitia of the carotid artery which was then cannulated with a micropuncture needle.  A 018 wire was inserted up to the mark and a micropuncture sheath was placed.  Contrast injections were performed to locate the carotid bifurcation which was marked on the screen.  Next, a carotid Amplatz wire was inserted up to the mark and the TCAR sheath was placed.  The flow reversal tubing was then connected and passive flow reversal was initiated, confirmed with a saline flush.  Next, multiple views of the carotid were obtained define the best angle.  This was a standard LAO.  It showed a near occlusion of the internal carotid artery.  Next, a TCAR timeout was called.  The hemodynamics were appropriate as well as the ACT.  The vessel loop around the proximal common carotid artery was tightened and active flow reversal was initiated, confirmed with a saline flush.  Next the carotid artery was cannulated with a angled catheter and a 014 wire.  The lesion was then predilated using a 6 x 2.5 balloon.  There was a significant drop in heart rate.  Next, stent was inserted and deployed.  This was a 9 x 40 stent.  After 2 minutes had followed by a completion image was performed which showed excellent result.  There was some luminal narrowing within the stent and so I postdilated with a 6 x 2.5 balloon with significant improvement in the stenosis following post dilation.  The wire was then removed flow reversal was discontinued and blood returned to the sheath in the right groin.  The sheath was removed from the carotid artery Prolene used to close the arteriotomy site.  Hand-held Doppler listening to the carotid signal which was appropriate.  There was a slight kink at the level of the sheath however I did not feel this was hemodynamically significant.  Patient's heparin  was reversed with 50 mg of protamine .  The sheath in the groin was removed  and manual pressure was held for hemostasis.  Once the neck incision was dry, I closed the platysma with running 3-0 Vicryl and the skin with 4-0 Vicryl.  Was applied.  The patient was successfully awakened from anesthesia and found to be moving all 4 extremities to command.  He was taken the recovery room in stable condition.   Disposition: To PACU stable   V. Malvina New, M.D., [facility] Ve[facility] Specialists of [place] Office: [phone] Pager:  [phone]

## 2023-08-14 NOTE — Transfer of Care (Signed)
 Immediate Anesthesia Transfer of Care Note  Patient: Dillon [name]  Procedure(s) Performed: LERETHA ARTERY REVASCULARIZATION (TCAR) (Left) ULTRASOUND GUIDANCE, FOR VASCULAR ACCESS (Right: Groin)  Patient Location: PACU  Anesthesia Type:General  Level of Consciousness: drowsy and patient cooperative  Airway & Oxygen Therapy: Patient connected to face mask oxygen  Post-op Assessment: Report given to RN, Post -op Vital signs reviewed and stable, and Patient moving all extremities X 4  Post vital signs: Reviewed and stable  Last Vitals:  Vitals Value Taken Time  BP 106/45 08/14/23 10:46  Temp    Pulse 49 08/14/23 10:50  Resp 15 08/14/23 10:50  SpO2 96 % 08/14/23 10:50  Vitals shown include unfiled device data.  Last Pain:  Vitals:   08/14/23 0608  TempSrc:   PainSc: 0-No pain      Patients Stated Pain Goal: 0 (08/14/23 0558)  Complications: There were no known notable events for this encounter.

## 2023-08-14 NOTE — Plan of Care (Signed)
  Problem: Education: Goal: Knowledge of General Education information will improve Description: Including pain rating scale, medication(s)/side effects and non-pharmacologic comfort measures Outcome: Progressing   Problem: Clinical Measurements: Goal: Ability to maintain clinical measurements within normal limits will improve Outcome: Progressing Goal: Will remain free from infection Outcome: Progressing Goal: Diagnostic test results will improve Outcome: Progressing Goal: Respiratory complications will improve Outcome: Progressing Goal: Cardiovascular complication will be avoided Outcome: Progressing   Problem: Activity: Goal: Risk for activity intolerance will decrease Outcome: Progressing   Problem: Nutrition: Goal: Adequate nutrition will be maintained Outcome: Progressing   Problem: Coping: Goal: Level of anxiety will decrease Outcome: Progressing   Problem: Elimination: Goal: Will not experience complications related to bowel motility Outcome: Progressing Goal: Will not experience complications related to urinary retention Outcome: Progressing   Problem: Pain Managment: Goal: General experience of comfort will improve and/or be controlled Outcome: Progressing   Problem: Safety: Goal: Ability to remain free from injury will improve Outcome: Progressing   Problem: Skin Integrity: Goal: Risk for impaired skin integrity will decrease Outcome: Progressing   Problem: Clinical Measurements: Goal: Postoperative complications will be avoided or minimized Outcome: Progressing   Problem: Respiratory: Goal: Will achieve and/or maintain a regular respiratory rate, without signs or symptoms of dyspnea Outcome: Progressing   Problem: Skin Integrity: Goal: Demonstration of wound healing without infection will improve Outcome: Progressing

## 2023-08-14 NOTE — Anesthesia Procedure Notes (Signed)
 Arterial Line Insertion Start/End7/18/2025 7:00 AM, 08/14/2023 7:05 AM Performed by: Leonce Athens, MD, Christopher Comings, CRNA, CRNA  Patient location: Pre-op. Preanesthetic checklist: patient identified, IV checked, site marked, risks and benefits discussed, surgical consent, monitors and equipment checked, pre-op evaluation, timeout performed and anesthesia consent Lidocaine  1% used for infiltration Left, radial was placed Catheter size: 20 G Hand hygiene performed , maximum sterile barriers used  and Seldinger technique used Allen's test indicative of satisfactory collateral circulation Attempts: 1 Procedure performed without using ultrasound guided technique. Following insertion, dressing applied. Post procedure assessment: normal and unchanged  Patient tolerated the procedure well with no immediate complications.

## 2023-08-14 NOTE — Anesthesia Procedure Notes (Addendum)
 Procedure Name: Intubation Date/Time: 08/14/2023 8:38 AM  Performed by: Christopher Comings, CRNAPre-anesthesia Checklist: Patient identified, Emergency Drugs available, Suction available and Patient being monitored Patient Re-evaluated:Patient Re-evaluated prior to induction Oxygen Delivery Method: Circle system utilized Preoxygenation: Pre-oxygenation with 100% oxygen Induction Type: IV induction Ventilation: Mask ventilation without difficulty Laryngoscope Size: Mac and 4 Grade View: Grade I Tube type: Oral Tube size: 7.0 mm Number of attempts: 1 Airway Equipment and Method: Stylet and Oral airway Placement Confirmation: ETT inserted through vocal cords under direct vision, positive ETCO2 and breath sounds checked- equal and bilateral Secured at: 22 cm Tube secured with: Tape Dental Injury: Teeth and Oropharynx as per pre-operative assessment  Comments: Intubation performed by Massie KET

## 2023-08-14 NOTE — Progress Notes (Signed)
Pt arrived from ...PACU.., A/ox ..4.pt denies any pain, MD aware,CCMD called. CHG bath given,no further needs at this time    

## 2023-08-14 NOTE — Discharge Instructions (Signed)

## 2023-08-14 NOTE — Anesthesia Postprocedure Evaluation (Signed)
 Anesthesia Post Note  Patient: Dillon [name]  Procedure(s) Performed: LERETHA ARTERY REVASCULARIZATION (TCAR) (Left) ULTRASOUND GUIDANCE, FOR VASCULAR ACCESS (Right: Groin)     Patient location during evaluation: PACU Anesthesia Type: General Level of consciousness: awake and alert, oriented and patient cooperative Pain management: pain level controlled Vital Signs Assessment: post-procedure vital signs reviewed and stable Respiratory status: spontaneous breathing, nonlabored ventilation, respiratory function stable and patient connected to nasal cannula oxygen Cardiovascular status: blood pressure returned to baseline and stable Postop Assessment: no apparent nausea or vomiting Anesthetic complications: no   There were no known notable events for this encounter.  Last Vitals:  Vitals:   08/14/23 1300 08/14/23 1315  BP: (!) 102/54 (!) 114/56  Pulse: (!) 42 (!) 37  Resp: 12 14  Temp:    SpO2: 94% 98%    Last Pain:  Vitals:   08/14/23 1245  TempSrc:   PainSc: 0-No pain                 Montie Swiderski,E. Tesa Meadors

## 2023-08-15 ENCOUNTER — Other Ambulatory Visit (HOSPITAL_COMMUNITY): Payer: Self-pay

## 2023-08-15 LAB — BASIC METABOLIC PANEL WITH GFR
Anion gap: 12 (ref 5–15)
BUN: 29 mg/dL — ABNORMAL HIGH (ref 8–23)
CO2: 20 mmol/L — ABNORMAL LOW (ref 22–32)
Calcium: 7.8 mg/dL — ABNORMAL LOW (ref 8.9–10.3)
Chloride: 106 mmol/L (ref 98–111)
Creatinine, Ser: 1.6 mg/dL — ABNORMAL HIGH (ref 0.61–1.24)
GFR, Estimated: 41 mL/min — ABNORMAL LOW (ref >60)
Glucose, Bld: 141 mg/dL — ABNORMAL HIGH (ref 70–99)
Potassium: 3.9 mmol/L (ref 3.5–5.1)
Sodium: 138 mmol/L (ref 135–145)

## 2023-08-15 LAB — CBC
HCT: 26.9 % — ABNORMAL LOW (ref 39.0–52.0)
Hemoglobin: 9 g/dL — ABNORMAL LOW (ref 13.0–17.0)
MCH: 32.1 pg (ref 26.0–34.0)
MCHC: 33.5 g/dL (ref 30.0–36.0)
MCV: 96.1 fL (ref 80.0–100.0)
Platelets: 118 K/uL — ABNORMAL LOW (ref 150–400)
RBC: 2.8 MIL/uL — ABNORMAL LOW (ref 4.22–5.81)
RDW: 13 % (ref 11.5–15.5)
WBC: 6.9 K/uL (ref 4.0–10.5)
nRBC: 0 % (ref 0.0–0.2)

## 2023-08-15 LAB — LIPID PANEL
Cholesterol: 70 mg/dL (ref 0–200)
HDL: 28 mg/dL — ABNORMAL LOW (ref >40)
LDL Cholesterol: 25 mg/dL (ref 0–99)
Total CHOL/HDL Ratio: 2.5 ratio
Triglycerides: 87 mg/dL (ref <150)
VLDL: 17 mg/dL (ref 0–40)

## 2023-08-15 MED ORDER — TRAMADOL HCL 50 MG PO TABS
50.0000 mg | ORAL_TABLET | Freq: Four times a day (QID) | ORAL | 0 refills | Status: AC | PRN
  Filled 2023-08-15: qty 12, 3d supply, fill #0

## 2023-08-15 NOTE — Progress Notes (Addendum)
  Progress Note    08/15/2023 8:04 AM 1 Day Post-Op  Subjective:  no complaints. Sitting  up on side of bed eating breakfast. Denies any trouble talking or swallowing. Has ambulated in room to bathroom without issues   Vitals:   08/15/23 0200 08/15/23 0323  BP:  (!) 124/43  Pulse: 63 (!) 41  Resp:  20  Temp:  98.4 F (36.9 C)  SpO2: 98% 97%   Physical Exam: Cardiac:  regular Lungs:  non labored Incisions:  left neck incision is c/d/I without swelling or hematoma Extremities:  moving all extremities without deficits Abdomen:  soft Neurologic: alert and oriented. Speech coherent. Tongue midline. Smile symmetric  CBC    Component Value Date/Time   WBC 6.9 08/15/2023 0500   RBC 2.80 (L) 08/15/2023 0500   HGB 9.0 (L) 08/15/2023 0500   HGB 12.2 (L) 05/03/2021 1005   HCT 26.9 (L) 08/15/2023 0500   HCT 36.0 (L) 05/03/2021 1005   PLT 118 (L) 08/15/2023 0500   PLT 186 05/03/2021 1005   MCV 96.1 08/15/2023 0500   MCV 96 05/03/2021 1005   MCH 32.1 08/15/2023 0500   MCHC 33.5 08/15/2023 0500   RDW 13.0 08/15/2023 0500   RDW 12.2 05/03/2021 1005   LYMPHSABS 3.0 05/03/2021 2034   MONOABS 0.6 05/03/2021 2034   EOSABS 0.2 05/03/2021 2034   BASOSABS 0.1 05/03/2021 2034    BMET    Component Value Date/Time   NA 138 08/15/2023 0500   NA 139 06/25/2021 1249   K 3.9 08/15/2023 0500   CL 106 08/15/2023 0500   CO2 20 (L) 08/15/2023 0500   GLUCOSE 141 (H) 08/15/2023 0500   BUN 29 (H) 08/15/2023 0500   BUN 25 06/25/2021 1249   CREATININE 1.60 (H) 08/15/2023 0500   CREATININE 1.10 01/18/2014 0803   CALCIUM  7.8 (L) 08/15/2023 0500   GFRNONAA 41 (L) 08/15/2023 0500   GFRAA >60 03/27/2016 0400    INR    Component Value Date/Time   INR 1.0 08/06/2023 1430     Intake/Output Summary (Last 24 hours) at 08/15/2023 0804 Last data filed at 08/14/2023 2010 Gross per 24 hour  Intake 1840 ml  Output 30 ml  Net 1810 ml     Assessment/Plan:  89 y.o. male is s/p Left TCAR 1 Day  Post-Op   Neurologically intact Left neck incision is intact, soft without swelling or hematoma Tolerating diet Voiding without difficulty Has ambulated in room without issues VSS. Hemodynamically stable Aspirin , Statin, Plavix  He remains stable for discharge home today Follow up in 1 month with be arranged with carotid duplex  Teretha Damme, PA-C [facility] [phone] 08/15/2023 8:04 AM  VASCULAR STAFF ADDENDUM: I have independently interviewed and examined the patient. I agree with the above.  Doing well. Incision healing nicely. No deficits. Home today.  Fonda FORBES Rim MD [facility] of [facility] Phone Number: [phone] 08/15/2023 9:22 AM

## 2023-08-15 NOTE — Progress Notes (Signed)
 Patient is alert, denies any complains, NIH is 0, Discharge education provided to the patient, educated about stroke S/S, PIV removed, CCMD notified, PIV removed, all the belongings taken by the patient, will pick up his TOC meds on the way to D/C lounge, no any concerns.

## 2023-08-16 NOTE — Discharge Summary (Signed)
 Carotid Discharge Summary     Dillon [name] Sep 03, 1933 89 y.o. male  [id number]  Admission Date: 08/14/2023  Discharge Date: 08/15/2023  Physician: Dr. Malvina New  Admission Diagnosis: Bilateral carotid artery stenosis [I65.23] S/P carotid endarterectomy [Z98.890] Carotid stenosis, asymptomatic, left [I65.22]  Discharge Day services:    none  Hospital Course:  The patient was admitted to the hospital and taken to the operating room on 08/14/2023 and underwent left transcarotid artery revascularization (TCAR).  The pt tolerated the procedure well and was transported to the PACU in good condition.  By POD 1, the pt neuro status remained intact. Tolerating diet. Ambulating without difficulty. He remained hemodynamically stable. No difficulty swallowing or talking. Incision on left neck intact without swelling or hematoma. Right femoral vein access site soft without swelling or hematoma.  The remainder of the hospital course consisted of increasing mobilization and increasing intake of solids without difficulty.  He remained stable for discharge home. He will continue his home medications as prescribed including Aspirin , Statin and Plavix . PDMP was reviewed and post operative pain medication was sent to his pharmacy. He will follow up in our office in 1 with carotid duplex.  Recent Labs    08/15/23 0500  NA 138  K 3.9  CL 106  CO2 20*  GLUCOSE 141*  BUN 29*  CALCIUM  7.8*   Recent Labs    08/14/23 1120 08/15/23 0500  WBC 6.0 6.9  HGB 10.3* 9.0*  HCT 30.8* 26.9*  PLT 143* 118*   No results for input(s): INR in the last 72 hours.   Discharge Instructions     CAROTID Sugery: Call MD for difficulty swallowing or speaking; weakness in arms or legs that is a new symtom; severe headache.  If you have increased swelling in the neck and/or  are having difficulty breathing, CALL 911   Complete by: As directed    Call MD for:  redness, tenderness, or signs of infection  (pain, swelling, bleeding, redness, odor or green/yellow discharge around incision site)   Complete by: As directed    Call MD for:  severe or increased pain, loss or decreased feeling  in affected limb(s)   Complete by: As directed    Call MD for:  temperature >100.5   Complete by: As directed    Discharge wound care:   Complete by: As directed    Keep incision dry for 24 hours. You can then wash with mild soap and water, pat dry. Do not soak in bathtub   Driving Restrictions   Complete by: As directed    No driving for 2 weeks or while taking narcotic pain medication   Increase activity slowly   Complete by: As directed    Walk with assistance use walker or cane as needed   Lifting restrictions   Complete by: As directed    No lifting for 4 weeks   Resume previous diet   Complete by: As directed        Discharge Diagnosis:  Bilateral carotid artery stenosis [I65.23] S/P carotid endarterectomy [Z98.890] Carotid stenosis, asymptomatic, left [I65.22]  Secondary Diagnosis: Patient Active Problem List   Diagnosis Date Noted   S/P carotid endarterectomy 08/14/2023   Carotid stenosis, asymptomatic, left 08/14/2023   Type 2 diabetes mellitus with stage 3b chronic kidney disease, without long-term current use of insulin  (HCC) 08/05/2022   S/P TAVR (transcatheter aortic valve replacement) 08/13/2021   1st degree AV block 06/06/2021   Angina pectoris (HCC) 06/05/2021   Severe aortic  stenosis 01/23/2021   Stage 3b chronic kidney disease (HCC) 01/23/2021   Gallstones 03/05/2020   AKI (acute kidney injury) (HCC) 01/07/2016   Carotid disease, bilateral (HCC) 01/06/2013   HTN (hypertension) 01/06/2013   CAD (coronary artery disease) 07/05/2012   Hyperlipidemia, mixed 07/05/2012   Hypothyroid 07/05/2012   Past Medical History:  Diagnosis Date   CAD (coronary artery disease)    Diabetes mellitus without complication (HCC)    History of hiatal hernia    HLD (hyperlipidemia)     Hypertension    Hypothyroidism    Pneumonia 12/2015   hx   S/P CABG x 3 12/17/2007   LIMA to LAD,SVG to left C   S/P TAVR (transcatheter aortic valve replacement) 08/13/2021   s/p TAVR with a 23 mm Edwards S3UR via the TF approach by Dr. Wonda & Dr. Lucas   Severe aortic stenosis    s/p TAVR    Allergies as of 08/15/2023   No Known Allergies      Medication List     TAKE these medications    amLODipine  10 MG tablet Commonly known as: NORVASC  Take 1 tablet (10 mg total) by mouth daily.   aspirin  EC 81 MG tablet Take 81 mg by mouth at bedtime.   B-12 2000 MCG Tabs Take 2,000 mcg by mouth daily.   clopidogrel  75 MG tablet Commonly known as: PLAVIX  Take 1 tablet (75 mg total) by mouth daily.   fexofenadine 180 MG tablet Commonly known as: ALLEGRA Take 180 mg by mouth daily.   folic acid  1 MG tablet Commonly known as: FOLVITE  Take 1 tablet (1 mg total) by mouth daily.   furosemide  20 MG tablet Commonly known as: LASIX  Take 1 tablet (20 mg total) by mouth daily.   imiquimod 5 % cream Commonly known as: ALDARA Apply 1 Application topically at bedtime.   levothyroxine  125 MCG tablet Commonly known as: SYNTHROID  Take 125 mcg by mouth daily before breakfast.   metoprolol  succinate 50 MG 24 hr tablet Commonly known as: TOPROL -XL Take 1 tablet (50 mg total) by mouth daily. Take with or immediately following a meal.   mupirocin ointment 2 % Commonly known as: BACTROBAN Apply 1 Application topically 2 (two) times daily as needed (wound care).   Omega-3 1400 MG Caps Take 1,400 mg by mouth daily.   rosuvastatin  40 MG tablet Commonly known as: CRESTOR  Take 1 tablet (40 mg total) by mouth daily.   terbinafine 1 % cream Commonly known as: LAMISIL Apply 1 application topically daily as needed (rash).   traMADol  50 MG tablet Commonly known as: ULTRAM  Take 1 tablet (50 mg total) by mouth every 6 (six) hours as needed for moderate pain (pain score 4-6).    Vitamin D 50 MCG (2000 UT) tablet Take 2,000 Units by mouth at bedtime.               Discharge Care Instructions  (From admission, onward)           Start     Ordered   08/15/23 0000  Discharge wound care:       Comments: Keep incision dry for 24 hours. You can then wash with mild soap and water, pat dry. Do not soak in bathtub   08/15/23 0810             Discharge Instructions:  [facility] Discharge Instructions Carotid Endarterectomy (CEA)  Please refer to the following instructions for your post-procedure care. Your surgeon  or physician assistant will discuss any changes with you.  Activity  You are encouraged to walk as much as you can. You can slowly return to normal activities but must avoid strenuous activity and heavy lifting until your doctor tell you it's OK. Avoid activities such as vacuuming or swinging a golf club. You can drive after one week if you are comfortable and you are no longer taking prescription pain medications. It is normal to feel tired for serval weeks after your surgery. It is also normal to have difficulty with sleep habits, eating, and bowel movements after surgery. These will go away with time.  Bathing/Showering  You may shower after you come home. Do not soak in a bathtub, hot tub, or swim until the incision heals completely.  Incision Care  Shower every day. Clean your incision with mild soap and water. Pat the area dry with a clean towel. You do not need a bandage unless otherwise instructed. Do not apply any ointments or creams to your incision. You may have skin glue on your incision. Do not peel it off. It will come off on its own in about one week. Your incision may feel thickened and raised for several weeks after your surgery. This is normal and the skin will soften over time. For Men Only: It's OK to shave around the incision but do not shave the incision itself for 2 weeks. It is common to  have numbness under your chin that could last for several months.  Diet  Resume your normal diet. There are no special food restrictions following this procedure. A low fat/low cholesterol diet is recommended for all patients with vascular disease. In order to heal from your surgery, it is CRITICAL to get adequate nutrition. Your body requires vitamins, minerals, and protein. Vegetables are the best source of vitamins and minerals. Vegetables also provide the perfect balance of protein. Processed food has little nutritional value, so try to avoid this.  Medications  Resume taking all of your medications unless your doctor or physician assistant tells you not to.  If your incision is causing pain, you may take over-the- counter pain relievers such as acetaminophen  (Tylenol ). If you were prescribed a stronger pain medication, please be aware these medications can cause nausea and constipation.  Prevent nausea by taking the medication with a snack or meal. Avoid constipation by drinking plenty of fluids and eating foods with a high amount of fiber, such as fruits, vegetables, and grains. Do not take Tylenol  if you are taking prescription pain medications.  Follow Up  Our office will schedule a follow up appointment 2-3 weeks following discharge.  Please call us  immediately for any of the following conditions  Increased pain, redness, drainage (pus) from your incision site. Fever of 101 degrees or higher. If you should develop stroke (slurred speech, difficulty swallowing, weakness on one side of your body, loss of vision) you should call 911 and go to the nearest emergency room.  Reduce your risk of vascular disease:  Stop smoking. If you would like help call [facility] at 1-800-QUIT-NOW (551-087-2013) or [place] at [phone]. Manage your cholesterol Maintain a desired weight Control your diabetes Keep your blood pressure down  If you have any questions, please call the office at  [phone].  Prescriptions given: Tramadol  50 mg #12 No Refill  Disposition:  Home  Patient's condition: is Good  Follow up: 1. Dr. Serene in 1 Month with carotid duplex  Adam Demary PA-C [facility] [phone]   ---  For [facility] Registry use ---   Modified Rankin score at D/C (0-6): 0  IV medication needed for:  1. Hypertension: No 2. Hypotension: No  Post-op Complications: No  1. Post-op CVA or TIA: No  If yes: Event classification (right eye, left eye, right cortical, left cortical, verterobasilar, other): n/a  If yes: Timing of event (intra-op, <6 hrs post-op, >=6 hrs post-op, unknown): n/a  2. CN injury: No  If yes: CN not injuried   3. Myocardial infarction: No  If yes: Dx by (EKG or clinical, Troponin): n/a  4.  CHF: No  5.  Dysrhythmia (new): No  6. Wound infection: No  7. Reperfusion symptoms: No  8. Return to OR: No  If yes: return to OR for (bleeding, neurologic, other CEA incision, other): n/a  Discharge medications: Statin use:  Yes ASA use:  Yes   Beta blocker use:  Yes ACE-Inhibitor use:  No  ARB use:  No CCB use: Yes P2Y12 Antagonist use: Yes, [email or web address] ] Plavix , [ ]  Plasugrel, [ ]  Ticlopinine, [ ]  Ticagrelor, [ ]  Other, [ ]  No for medical reason, [ ]  Non-compliant, [ ]  Not-indicated Anti-coagulant use:  No, [ ]  Warfarin, [ ]  Rivaroxaban, [ ]  Dabigatran,

## 2023-08-17 ENCOUNTER — Encounter (HOSPITAL_COMMUNITY): Payer: Self-pay | Admitting: Surgery

## 2023-08-20 ENCOUNTER — Encounter: Payer: Self-pay | Admitting: Cardiovascular Disease

## 2023-08-20 ENCOUNTER — Ambulatory Visit: Attending: Cardiovascular Disease | Admitting: Cardiovascular Disease

## 2023-08-20 VITALS — BP 138/58 | HR 54 | Ht 66.0 in | Wt 177.0 lb

## 2023-08-20 DIAGNOSIS — N1832 Chronic kidney disease, stage 3b: Secondary | ICD-10-CM | POA: Diagnosis not present

## 2023-08-20 DIAGNOSIS — I25810 Atherosclerosis of coronary artery bypass graft(s) without angina pectoris: Secondary | ICD-10-CM

## 2023-08-20 DIAGNOSIS — E782 Mixed hyperlipidemia: Secondary | ICD-10-CM | POA: Diagnosis not present

## 2023-08-20 DIAGNOSIS — Z952 Presence of prosthetic heart valve: Secondary | ICD-10-CM

## 2023-08-20 DIAGNOSIS — I10 Essential (primary) hypertension: Secondary | ICD-10-CM

## 2023-08-20 MED ORDER — CLOPIDOGREL BISULFATE 75 MG PO TABS: 75.0000 mg | ORAL_TABLET | Freq: Every day | ORAL | 3 refills | Status: AC

## 2023-08-20 MED ORDER — AMLODIPINE BESYLATE 10 MG PO TABS: 10.0000 mg | ORAL_TABLET | Freq: Every day | ORAL | 3 refills | Status: AC

## 2023-08-20 MED ORDER — FOLIC ACID 1 MG PO TABS: 1.0000 mg | ORAL_TABLET | Freq: Every day | ORAL | 3 refills | Status: AC

## 2023-08-20 NOTE — Assessment & Plan Note (Signed)
 Blood pressure is well-controlled on amlodipine , furosemide , and metoprolol  succinate.

## 2023-08-20 NOTE — Assessment & Plan Note (Signed)
 Treated with rosuvastatin  40 mg daily.  LDL cholesterol is 25.  Transaminases are normal with an ALT of 13.

## 2023-08-20 NOTE — Progress Notes (Signed)
 Cardiology Office Note:    Date:  08/20/2023   ID:  Dillon [name], DOB July 15, 1933, MRN [id number]  PCP:  Evangelina Tinnie Norris, PA-C   [facility] Providers Cardiologist:  None     Referring MD: Evangelina Tinnie Brier*   Chief Complaint  Patient presents with   Coronary Artery Disease    History of Present Illness:    Dillon [name] is a 89 y.o. male presenting for follow-up of coronary artery disease.  He has a history of remote CABG.  He was found to have an atretic LIMA graft and underwent stenting of severe 95% ostial left main stenosis in 2023.  At that time he had been noted to have an atretic small caliber LIMA.  He had moderate saphenous vein graft disease with recommendations for medical therapy.  He developed severe aortic stenosis and underwent TAVR with a 23 mm Edwards SAPIEN 3 ultra Resilia valve later in 2023.  The patient is here alone today.  He recently underwent transcarotid artery revascularization on July 18 with no complication. He had a recent echo 08/06/23 showing normal LV and RV function, mild MR, and normal function of his TAVR prosthesis with a mean gradient of 17 mmHg and trivial PVL.  He has been doing well from a cardiac perspective.  He denies any chest pain, chest pressure, or shortness of breath.  He denies heart palpitations, orthopnea, or PND.  He does admit to mild lower extremity edema that has been chronic.  Current Medications: Current Meds  Medication Sig   aspirin  EC 81 MG tablet Take 81 mg by mouth at bedtime.    Cholecalciferol (VITAMIN D) 2000 units tablet Take 2,000 Units by mouth at bedtime.   Cyanocobalamin (B-12) 2000 MCG TABS Take 2,000 mcg by mouth daily.   fexofenadine (ALLEGRA) 180 MG tablet Take 180 mg by mouth daily.   furosemide  (LASIX ) 20 MG tablet Take 1 tablet (20 mg total) by mouth daily.   imiquimod (ALDARA) 5 % cream Apply 1 Application topically at bedtime.   levothyroxine  (SYNTHROID ) 125 MCG tablet Take 125 mcg by  mouth daily before breakfast.   metoprolol  succinate (TOPROL -XL) 50 MG 24 hr tablet Take 1 tablet (50 mg total) by mouth daily. Take with or immediately following a meal.   mupirocin ointment (BACTROBAN) 2 % Apply 1 Application topically 2 (two) times daily as needed (wound care).   Omega-3 1400 MG CAPS Take 1,400 mg by mouth daily.   rosuvastatin  (CRESTOR ) 40 MG tablet Take 1 tablet (40 mg total) by mouth daily.   terbinafine (LAMISIL) 1 % cream Apply 1 application topically daily as needed (rash).    traMADol  (ULTRAM ) 50 MG tablet Take 1 tablet (50 mg total) by mouth every 6 (six) hours as needed for moderate pain (pain score 4-6).   [DISCONTINUED] amLODipine  (NORVASC ) 10 MG tablet Take 1 tablet (10 mg total) by mouth daily.   [DISCONTINUED] clopidogrel  (PLAVIX ) 75 MG tablet Take 1 tablet (75 mg total) by mouth daily.   [DISCONTINUED] folic acid  (FOLVITE ) 1 MG tablet Take 1 tablet (1 mg total) by mouth daily.     Allergies:   Patient has no known allergies.   ROS:   Please see the history of present illness.    All other systems reviewed and are negative.  EKGs/Labs/Other Studies Reviewed:    The following studies were reviewed today: Cardiac Studies & Procedures   ______________________________________________________________________________________________ CARDIAC CATHETERIZATION  CARDIAC CATHETERIZATION 06/05/2021  Conclusion Successful IVUS guided PCI of severe  90% stenosis at the ostium of the left mainstem, treated with a 4.5 x 12 mm Synergy DES.  Recommend: Overnight observation and hydration, repeat metabolic panel tomorrow morning, continue with plans for TAVR for treatment of severe symptomatic aortic stenosis.  Findings Coronary Findings Diagnostic  Dominance: Right  Left Main Ost LM lesion is 95% stenosed.  Left Circumflex Ost Cx to Prox Cx lesion is 50% stenosed.  Right Coronary Artery Ost RCA lesion is 100% stenosed.  Graft To Dist RCA Prox Graft-1 lesion  is 65% stenosed. Prox Graft-2 lesion is 50% stenosed.  Graft To Mid Cx Prox Graft lesion is 45% stenosed.  LIMA Graft To Mid LAD And is small.  Intervention  Ost LM lesion Stent CATH VISTA GUIDE 6FR XBLAD3.5 guide catheter was inserted. Lesion crossed with guidewire using a WIRE COUGAR XT STRL 190CM. Pre-stent angioplasty was performed using a BALLN EUPHORA RX 4.0X12. A drug-eluting stent was successfully placed using a SYNERGY XD 4.50X12. Post-stent angioplasty was performed using a BALL SAPPHIRE NC24 4.5X10. Maximum pressure:  20 atm. Initially, intravascular ultrasound was performed via mechanical pullback.  This demonstrates severe stenosis over a focal region at the true ostium of the left main (aorto ostial lesion).  There is about 270 degree arc of calcium , but at least 90 degrees of this does not appear dense.  I elected to proceed with balloon predilatation using a 4.0 mm balloon which is expanded well at nominal pressure of 8 atm.  The left main ostium is then stented with caution taken to fully cover the ostium but not leave the stent hanging back too far into the aorta.  A 4.5 x 12 mm Synergy DES is utilized.  The stent is deployed at 16 atm and then postdilated with a 4.5 mm noncompliant balloon to 20 atm.  Intravascular ultrasound is performed and confirms full lesion coverage with good stent expansion throughout.  There is mild underexpansion at the lesion site but this area is redilated to high-pressure with a noncompliant balloon for better expansion.  The patient tolerated the procedure well with no immediate complication. Post-Intervention Lesion Assessment The intervention was successful. Pre-interventional TIMI flow is 3. Post-intervention TIMI flow is 3. No complications occurred at this lesion. Ultrasound (IVUS) was performed on the lesion post PCI using a CATH OPTICROSS HD. Stent well apposed. Severe plaque burden detected. Lesion characteristics:  calcified. There is a 0%  residual stenosis post intervention.   CARDIAC CATHETERIZATION  CARDIAC CATHETERIZATION 05/14/2021  Conclusion   Ost LM lesion is 95% stenosed.   Ost RCA lesion is 100% stenosed.   Prox Graft-1 lesion is 65% stenosed.   Prox Graft-2 lesion is 50% stenosed.   Prox Graft lesion is 45% stenosed.   Ost Cx to Prox Cx lesion is 50% stenosed.   and is small.  Very mildly elevated right heart pressures.  Severely calcified aortic valve with echocardiographic documentation of a mean gradient of 53 mmHg, and peak gradient 83 mmHg.  Severe native CAD with 95% eccentric ostial left main stenosis; 40% first diagonal stenosis of the LAD; 50% proximal circumflex stenosis; and ostial occlusion of the RCA.  Atretic very small caliber LIMA graft supplying the mid distal LAD.  Patent vein graft supplying the left circumflex vessel with 40 - 50% proximal third stenosis.  Patent vein graft supplying the RCA with filling of the RCA retrograde to the ostium.  There is a valve in the body of the graft with narrowing of 65 proximal and 50% distal  to the probable valve.  RECOMMENDATION: The patient has a scheduled appointment to see Dr. Dorise Fellers as part of the structural heart team evaluation.  He  will undergo follow-up CT imaging per TAVR protocol.  Ultimate decision regarding SAVR with revascularization versus TAVR and left main PCI per Drs. Bartle and [employer].  Findings Coronary Findings Diagnostic  Dominance: Right  Left Main Ost LM lesion is 95% stenosed.  Left Circumflex Ost Cx to Prox Cx lesion is 50% stenosed.  Right Coronary Artery Ost RCA lesion is 100% stenosed.  Graft To Dist RCA Prox Graft-1 lesion is 65% stenosed. Prox Graft-2 lesion is 50% stenosed.  Graft To Mid Cx Prox Graft lesion is 45% stenosed.  LIMA Graft To Mid LAD And is small.  Intervention  No interventions have been documented.   STRESS TESTS  MYOCARDIAL PERFUSION IMAGING 06/08/2014    ECHOCARDIOGRAM  ECHOCARDIOGRAM COMPLETE 08/06/2023  Narrative ECHOCARDIOGRAM REPORT    Patient Name:   Dillon [name]   Date of Exam: 08/06/2023 Medical Rec #:  [id number]     Height:       66.0 in Accession #:    [id number]    Weight:       178.0 lb Date of Birth:  Mar 07, 1933      BSA:          1.903 m Patient Age:    89 years      BP:           125/64 mmHg Patient Gender: M             HR:           57 bpm. Exam Location:  [address]  Procedure: 2D Echo, Cardiac Doppler and Color Doppler (Both Spectral and Color Flow Doppler were utilized during procedure).  Indications:    Z95.2 S/p TAVR  History:        Patient has prior history of Echocardiogram examinations, most recent 08/13/2022. Prior CABG, S/p TAVR (23mm Edwards Sapien 3UR), Arrythmias:LBBB; Risk Factors:Hypertension.  Sonographer:    Elsie Bohr RDCS Referring Phys: [phone] THOMAS A KELLY  IMPRESSIONS   1. Left ventricular ejection fraction, by estimation, is 60 to 65%. The left ventricle has normal function. The left ventricle has no regional wall motion abnormalities. There is moderate concentric left ventricular hypertrophy. Left ventricular diastolic function could not be evaluated. 2. Right ventricular systolic function is normal. The right ventricular size is normal. 3. Left atrial size was mildly dilated. 4. The mitral valve is grossly normal. Mild mitral valve regurgitation. Moderate mitral annular calcification. Inadequate mitral valve gradients obtained. 5. Tricuspid valve regurgitation is moderate. 6. The aortic valve has been repaired/replaced. Aortic valve regurgitation is trivial. No aortic stenosis is present. Aortic valve area, by VTI measures 1.12 cm. Aortic valve mean gradient measures 17.0 mmHg. Aortic valve Vmax measures 2.90 m/s. 7. Aortic dilatation noted. There is borderline dilatation of the aortic root, measuring 38 mm. 8. The inferior vena cava is normal in size with greater than 50%  respiratory variability, suggesting right atrial pressure of 3 mmHg.  FINDINGS Left Ventricle: Left ventricular ejection fraction, by estimation, is 60 to 65%. The left ventricle has normal function. The left ventricle has no regional wall motion abnormalities. The left ventricular internal cavity size was normal in size. There is moderate concentric left ventricular hypertrophy. Left ventricular diastolic function could not be evaluated due to atrial fibrillation. Left ventricular diastolic function could not be evaluated.  Right Ventricle: The right ventricular  size is normal. No increase in right ventricular wall thickness. Right ventricular systolic function is normal.  Left Atrium: Left atrial size was mildly dilated.  Right Atrium: Right atrial size was normal in size.  Pericardium: There is no evidence of pericardial effusion.  Mitral Valve: The mitral valve is grossly normal. There is mild thickening of the mitral valve leaflet(s). Moderately decreased mobility of the mitral valve leaflets. Moderate mitral annular calcification. Mild mitral valve regurgitation.  Tricuspid Valve: The tricuspid valve is normal in structure. Tricuspid valve regurgitation is moderate . No evidence of tricuspid stenosis.  Aortic Valve: The aortic valve has been repaired/replaced. Aortic valve regurgitation is trivial. No aortic stenosis is present. Aortic valve mean gradient measures 17.0 mmHg. Aortic valve peak gradient measures 33.6 mmHg. Aortic valve area, by VTI measures 1.12 cm. There is a 23 mm Sapien prosthetic, stented (TAVR) valve present in the aortic position.  Pulmonic Valve: The pulmonic valve was normal in structure. Pulmonic valve regurgitation is trivial. No evidence of pulmonic stenosis.  Aorta: The aortic root is normal in size and structure and aortic dilatation noted. There is borderline dilatation of the aortic root, measuring 38 mm.  Venous: The inferior vena cava is normal in size  with greater than 50% respiratory variability, suggesting right atrial pressure of 3 mmHg.  IAS/Shunts: No atrial level shunt detected by color flow Doppler.   LEFT VENTRICLE PLAX 2D LVIDd:         4.40 cm   Diastology LVIDs:         3.10 cm   LV e' medial:    5.11 cm/s LV PW:         1.20 cm   LV E/e' medial:  24.5 LV IVS:        1.50 cm   LV e' lateral:   8.49 cm/s LVOT diam:     2.00 cm   LV E/e' lateral: 14.7 LV SV:         71 LV SV Index:   37 LVOT Area:     3.14 cm   RIGHT VENTRICLE             IVC RV S prime:     10.90 cm/s  IVC diam: 1.30 cm TAPSE (M-mode): 1.4 cm RVSP:           32.6 mmHg  LEFT ATRIUM             Index        RIGHT ATRIUM           Index LA diam:        4.90 cm 2.57 cm/m   RA Pressure: 3.00 mmHg LA Vol (A2C):   58.4 ml 30.69 ml/m  RA Area:     19.90 cm LA Vol (A4C):   72.4 ml 38.04 ml/m  RA Volume:   50.30 ml  26.43 ml/m LA Biplane Vol: 66.3 ml 34.84 ml/m AORTIC VALVE AV Area (Vmax):    1.10 cm AV Area (Vmean):   1.22 cm AV Area (VTI):     1.12 cm AV Vmax:           290.00 cm/s AV Vmean:          185.333 cm/s AV VTI:            0.638 m AV Peak Grad:      33.6 mmHg AV Mean Grad:      17.0 mmHg LVOT Vmax:  102.00 cm/s LVOT Vmean:        71.700 cm/s LVOT VTI:          0.227 m LVOT/AV VTI ratio: 0.36  AORTA Ao Root diam: 2.90 cm Ao Asc diam:  3.80 cm  MITRAL VALVE                TRICUSPID VALVE MV Area (PHT): 3.20 cm     TR Peak grad:   29.6 mmHg MV Decel Time: 237 msec     TR Vmax:        272.00 cm/s MV E velocity: 125.00 cm/s  Estimated RAP:  3.00 mmHg MV A velocity: 130.00 cm/s  RVSP:           32.6 mmHg MV E/A ratio:  0.96 SHUNTS Systemic VTI:  0.23 m Systemic Diam: 2.00 cm  Aditya Sabharwal Electronically signed by Ria Commander Signature Date/Time: 08/06/2023/5:53:29 PM    Final    MONITORS  LONG TERM MONITOR-LIVE TELEMETRY (3-14 DAYS) 09/03/2021  Narrative Patch Wear Time:  14 days and 0 hours  (2023-07-19T10:09:28-0400 to 2023-08-02T10:09:28-0400)  Patient had a min HR of 25 bpm, max HR of 91 bpm, and avg HR of 59 bpm. Predominant underlying rhythm was Sinus Rhythm. First Degree AV Block was present. Second Degree AV Block-Mobitz I (Wenckebach) was present. Isolated SVEs were frequent (8.4%, 97219), SVE Couplets were rare (<1.0%, 251), and no SVE Triplets were present. Isolated VEs were occasional (1.7%, 19665), VE Couplets were rare (<1.0%, 1445), and no VE Triplets were present.  The predominant rhythm was sinus rhythm at an average rate of 59 bpm with a range of 45 to 91 bpm.  There was very rare ectopy.  No episodes of atrial fibrillation or prolonged pauses were demonstrated.   CT SCANS  CT CORONARY MORPH W/CTA COR W/SCORE 06/25/2021  Addendum 06/25/2021 11:20 PM ADDENDUM REPORT: 06/25/2021 23:17  CLINICAL DATA:  12 -year-old male with severe aortic stenosis being evaluated for a TAVR procedure.  EXAM: Cardiac TAVR CT  TECHNIQUE: The patient was scanned on a [employer]. A 120 kV retrospective scan was triggered in the descending thoracic aorta at 111 HU's. Gantry rotation speed was 250 msecs and collimation was .6 mm. No beta blockade or nitro were given. The 3D data set was reconstructed in 5% intervals of the R-R cycle. Systolic and diastolic phases were analyzed on a dedicated work station using MPR, MIP and VRT modes. The patient received 80 cc of contrast.  FINDINGS: Aortic Root:  Aortic valve: Tricuspid  Aortic valve calcium  score: 2081  Aortic annulus:  Diameter: 25mm x 19mm  Perimeter: 68mm  Area: 344 mm^2  Calcifications: No calcifications  Coronary height: Min Left - 11mm, Max Left - 17mm; Min Right - 12mm  Sinotubular height: Left cusp - 22mm; Right cusp - 18mm; Noncoronary cusp - 22mm  LVOT (as measured 3 mm below the annulus):  Diameter: 26mm x 16mm  Area: 303 mm^2  Calcifications: No calcifications  Aortic sinus width:  Left cusp - 32mm; Right cusp - 32mm; Noncoronary cusp - 33mm  Sinotubular junction width: 28mm x 27mm  Optimum Fluoroscopic Angle for Delivery: LAO 27 CRA 8  Cardiac:  Right atrium: Mild enlargement  Right ventricle: Normal size  Pulmonary arteries: Normal size  Pulmonary veins: Normal configuration  Left atrium: Mild enlargement  Left ventricle: Mild dilatation  Pericardium: Normal thickness  Coronary arteries: S/p left main stent. Atretic LIMA to LAD. Patent SVG to LCX and SVG to RCA  IMPRESSION: 1. Tricuspid aortic valve with severe calcifications (AV calcium  score 2081)  2. Aortic annulus measures 25mm x 19mm in diameter with perimeter 68mm and area 344 mm^2. No annular or LVOT calcifications. Annular measurement suitable for delivery of 23mm Edwards Sapien 3 valve  3. Low coronary heights, measuring 11mm to left main and 12mm to RCA  4. Optimum Fluoroscopic Angle for Delivery:  LAO 27 CRA 8   Electronically Signed By: Lonni Nanas M.D. On: 06/25/2021 23:17  Narrative EXAM: OVER-READ INTERPRETATION  CT CHEST  The following report is a limited chest CT over-read performed by radiologist Dr. Rea Marc of [facility] on 06/25/2021. This over-read does not include interpretation of cardiac or coronary anatomy or pathology. The cardiac CTA interpretation by the cardiologist is attached.  COMPARISON:  None Available.  FINDINGS: Extracardiac findings will be described separately under dictation for contemporaneously obtained CTA chest, abdomen and pelvis.  IMPRESSION: Please see separate dictation for contemporaneously obtained CTA chest, abdomen and pelvis dated 06/25/2021 for full description of relevant extracardiac findings.  Electronically Signed: By: Rea Marc M.D. On: 06/25/2021 13:22     ______________________________________________________________________________________________      EKG:         Recent Labs: 08/06/2023: ALT 13 08/15/2023: BUN 29; Creatinine, Ser 1.60; Hemoglobin 9.0; Platelets 118; Potassium 3.9; Sodium 138  Recent Lipid Panel    Component Value Date/Time   CHOL 70 08/15/2023 0500   CHOL 139 08/30/2012 0845   TRIG 87 08/15/2023 0500   TRIG 82 08/30/2012 0845   HDL 28 (L) 08/15/2023 0500   HDL 68 08/30/2012 0845   CHOLHDL 2.5 08/15/2023 0500   VLDL 17 08/15/2023 0500   LDLCALC 25 08/15/2023 0500   LDLCALC 55 08/30/2012 0845     Risk Assessment/Calculations:                Physical Exam:    VS:  BP (!) 138/58 (BP Location: Left Arm)   Pulse (!) 54   Ht 5' 6 (1.676 m)   Wt 177 lb (80.3 kg)   SpO2 95%   BMI 28.57 kg/m     Wt Readings from Last 3 Encounters:  08/20/23 177 lb (80.3 kg)  08/14/23 175 lb (79.4 kg)  08/06/23 175 lb 4.8 oz (79.5 kg)     GEN: Elderly male in no acute distress HEENT: Normal NECK: No JVD; well-healing incision at the base of the left neck LYMPHATICS: No lymphadenopathy CARDIAC: RRR, 2/6 systolic murmur at the right upper sternal border, no diastolic murmur RESPIRATORY:  Clear to auscultation without rales, wheezing or rhonchi  ABDOMEN: Soft, non-tender, non-distended MUSCULOSKELETAL: Trace bilateral pretibial edema; No deformity  SKIN: Warm and dry NEUROLOGIC:  Alert and oriented x 3 PSYCHIATRIC:  Normal affect   Assessment & Plan S/P TAVR (transcatheter aortic valve replacement) Patient's recent echo is reviewed and demonstrates normal TAVR prosthetic valve function.  Patient follows SBE prophylaxis as indicated.  He remains on DAPT after left main stenting.  Seems to be tolerating this well.  Repeat echocardiogram in 1 year at the time of his next follow-up visit. Coronary artery disease involving coronary bypass graft of native heart without angina pectoris Stable with no symptoms of angina.  Continue aspirin  and clopidogrel  as well as a high intensity statin drug. Stage 3b chronic kidney disease (HCC) I  reviewed his labs with recent creatinine of 1.6.  We looked back over time and his creatinine is entirely stable over the past few years. Hyperlipidemia,  mixed Treated with rosuvastatin  40 mg daily.  LDL cholesterol is 25.  Transaminases are normal with an ALT of 13. Primary hypertension Blood pressure is well-controlled on amlodipine , furosemide , and metoprolol  succinate.    Medication Adjustments/Labs and Tests Ordered: Current medicines are reviewed at length with the patient today.  Concerns regarding medicines are outlined above.  Orders Placed This Encounter  Procedures   ECHOCARDIOGRAM COMPLETE   Meds ordered this encounter  Medications   amLODipine  (NORVASC ) 10 MG tablet    Sig: Take 1 tablet (10 mg total) by mouth daily.    Dispense:  90 tablet    Refill:  3   clopidogrel  (PLAVIX ) 75 MG tablet    Sig: Take 1 tablet (75 mg total) by mouth daily.    Dispense:  90 tablet    Refill:  3   folic acid  (FOLVITE ) 1 MG tablet    Sig: Take 1 tablet (1 mg total) by mouth daily.    Dispense:  90 tablet    Refill:  3    ~WEB Refill~AUTOMATION Refill    Patient Instructions  Testing/Procedures: ECHO (in 1 year, prior to visit) Your physician has requested that you have an echocardiogram. Echocardiography is a painless test that uses sound waves to create images of your heart. It provides your doctor with information about the size and shape of your heart and how well your heart's chambers and valves are working. This procedure takes approximately one hour. There are no restrictions for this procedure. Please do NOT wear cologne, perfume, aftershave, or lotions (deodorant is allowed). Please arrive 15 minutes prior to your appointment time.  Please note: We ask at that you not bring children with you during ultrasound (echo/ vascular) testing. Due to room size and safety concerns, children are not allowed in the ultrasound rooms during exams. Our front office staff cannot provide  observation of children in our lobby area while testing is being conducted. An adult accompanying a patient to their appointment will only be allowed in the ultrasound room at the discretion of the ultrasound technician under special circumstances. We apologize for any inconvenience.  Follow-Up: At [facility], you and your health needs are our priority.  As part of our continuing mission to provide you with exceptional heart care, our providers are all part of one team.  This team includes your primary Cardiologist (physician) and Advanced Practice Providers or APPs (Physician Assistants and Nurse Practitioners) who all work together to provide you with the care you need, when you need it.  Your next appointment:   1 year(s)  Provider:   Ozell Fell, MD     Signed, Ozell Fell, MD  08/20/2023 9:16 AM    [facility]

## 2023-08-20 NOTE — Assessment & Plan Note (Signed)
 I reviewed his labs with recent creatinine of 1.6.  We looked back over time and his creatinine is entirely stable over the past few years.

## 2023-08-20 NOTE — Assessment & Plan Note (Signed)
 Patient's recent echo is reviewed and demonstrates normal TAVR prosthetic valve function.  Patient follows SBE prophylaxis as indicated.  He remains on DAPT after left main stenting.  Seems to be tolerating this well.  Repeat echocardiogram in 1 year at the time of his next follow-up visit.

## 2023-08-20 NOTE — Assessment & Plan Note (Signed)
 Stable with no symptoms of angina.  Continue aspirin  and clopidogrel  as well as a high intensity statin drug.

## 2023-08-20 NOTE — Patient Instructions (Signed)
 Testing/Procedures: ECHO (in 1 year, prior to visit) Your physician has requested that you have an echocardiogram. Echocardiography is a painless test that uses sound waves to create images of your heart. It provides your doctor with information about the size and shape of your heart and how well your heart's chambers and valves are working. This procedure takes approximately one hour. There are no restrictions for this procedure. Please do NOT wear cologne, perfume, aftershave, or lotions (deodorant is allowed). Please arrive 15 minutes prior to your appointment time.  Please note: We ask at that you not bring children with you during ultrasound (echo/ vascular) testing. Due to room size and safety concerns, children are not allowed in the ultrasound rooms during exams. Our front office staff cannot provide observation of children in our lobby area while testing is being conducted. An adult accompanying a patient to their appointment will only be allowed in the ultrasound room at the discretion of the ultrasound technician under special circumstances. We apologize for any inconvenience.  Follow-Up: At [facility], you and your health needs are our priority.  As part of our continuing mission to provide you with exceptional heart care, our providers are all part of one team.  This team includes your primary Cardiologist (physician) and Advanced Practice Providers or APPs (Physician Assistants and Nurse Practitioners) who all work together to provide you with the care you need, when you need it.  Your next appointment:   1 year(s)  Provider:   Arnoldo Lapping, MD

## 2023-08-26 ENCOUNTER — Other Ambulatory Visit: Payer: Self-pay

## 2023-08-26 DIAGNOSIS — I6523 Occlusion and stenosis of bilateral carotid arteries: Secondary | ICD-10-CM

## 2023-08-26 DIAGNOSIS — I779 Disorder of arteries and arterioles, unspecified: Secondary | ICD-10-CM

## 2023-09-21 ENCOUNTER — Ambulatory Visit: Attending: Physician Assistant | Admitting: Physician Assistant

## 2023-09-21 ENCOUNTER — Encounter: Payer: Self-pay | Admitting: Physician Assistant

## 2023-09-21 ENCOUNTER — Ambulatory Visit (HOSPITAL_COMMUNITY)
Admission: RE | Admit: 2023-09-21 | Discharge: 2023-09-21 | Disposition: A | Discharge status: Home / Self Care | Source: Ambulatory Visit | Attending: Surgery | Admitting: Surgery

## 2023-09-21 VITALS — BP 153/75 | HR 59 | Temp 97.7°F | Wt 171.9 lb

## 2023-09-21 DIAGNOSIS — I779 Disorder of arteries and arterioles, unspecified: Secondary | ICD-10-CM | POA: Diagnosis present

## 2023-09-21 DIAGNOSIS — I6523 Occlusion and stenosis of bilateral carotid arteries: Secondary | ICD-10-CM

## 2023-09-21 NOTE — Progress Notes (Signed)
 POST OPERATIVE OFFICE NOTE    CC:  F/u for surgery  HPI: Dillon [name] is a 89 y.o. male who is here for postop visit.  He recently underwent left TCAR on 08/14/2023 by Dr. Serene.  This was done for high-grade asymptomatic left carotid artery stenosis.  He also has a history of right carotid endarterectomy in 2018.  He returns today for follow-up.  He has no complaints at today's visit.  He denies any issues with his left-sided neck incision such as drainage, redness, bleeding, or tenderness.  He also denies any strokelike symptoms such as slurred speech, facial droop, sudden weakness/numbness, or sudden visual changes.  He has been taking a daily aspirin , Plavix , and statin.   No Known Allergies  Current Outpatient Medications  Medication Sig Dispense Refill   amLODipine  (NORVASC ) 10 MG tablet Take 1 tablet (10 mg total) by mouth daily. 90 tablet 3   aspirin  EC 81 MG tablet Take 81 mg by mouth at bedtime.      Cholecalciferol (VITAMIN D) 2000 units tablet Take 2,000 Units by mouth at bedtime.     clopidogrel  (PLAVIX ) 75 MG tablet Take 1 tablet (75 mg total) by mouth daily. 90 tablet 3   Cyanocobalamin (B-12) 2000 MCG TABS Take 2,000 mcg by mouth daily.     fexofenadine (ALLEGRA) 180 MG tablet Take 180 mg by mouth daily.     folic acid  (FOLVITE ) 1 MG tablet Take 1 tablet (1 mg total) by mouth daily. 90 tablet 3   furosemide  (LASIX ) 20 MG tablet Take 1 tablet (20 mg total) by mouth daily. 90 tablet 2   imiquimod (ALDARA) 5 % cream Apply 1 Application topically at bedtime.     levothyroxine  (SYNTHROID ) 125 MCG tablet Take 125 mcg by mouth daily before breakfast.     metoprolol  succinate (TOPROL -XL) 50 MG 24 hr tablet Take 1 tablet (50 mg total) by mouth daily. Take with or immediately following a meal. 90 tablet 2   mupirocin ointment (BACTROBAN) 2 % Apply 1 Application topically 2 (two) times daily as needed (wound care).     Omega-3 1400 MG CAPS Take 1,400 mg by mouth daily.      rosuvastatin  (CRESTOR ) 40 MG tablet Take 1 tablet (40 mg total) by mouth daily. 90 tablet 1   terbinafine (LAMISIL) 1 % cream Apply 1 application topically daily as needed (rash).      traMADol  (ULTRAM ) 50 MG tablet Take 1 tablet (50 mg total) by mouth every 6 (six) hours as needed for moderate pain (pain score 4-6). 12 tablet 0   No current facility-administered medications for this visit.     ROS:  See HPI  Physical Exam:  Incision:  left sided neck incision c/d/l without hematoma Extremities:  palpable radial pulses bilaterally. Moving all extremities equally Neuro: alert and oriented x3  Studies: Carotid Duplex (09/21/2023) Patent left carotid artery stent without stenosis.  Patent right carotid endarterectomy site without restenosis.    Assessment/Plan:  This is a 89 y.o. male who is here for a post op visit  - The patient recently underwent left TCAR for asymptomatic critical ICA stenosis.  He also has a history of right carotid endarterectomy several years ago -His left-sided neck incision is well-healed without infection or hematoma - He remains neurologically intact on exam.  He denies any strokelike symptoms since surgery such as slurred speech, facial droop, sudden weakness/numbness, or sudden visual changes - Carotid duplex demonstrates a patent left ICA stent without stenosis.  His previous right carotid endarterectomy site remains patent without restenosis - He we will continue his daily aspirin , Plavix , and statin.  He can follow-up with our office in 9 months with repeat carotid duplex   Ahmed Holster, PA-C [facility] [phone]   Clinic MD:  Serene

## 2023-10-08 ENCOUNTER — Other Ambulatory Visit: Payer: Self-pay

## 2023-10-09 MED ORDER — FUROSEMIDE 20 MG PO TABS: 20.0000 mg | ORAL_TABLET | Freq: Every day | ORAL | 3 refills | Status: AC

## 2024-02-20 ENCOUNTER — Encounter: Payer: Self-pay | Admitting: Cardiovascular Disease

## 2024-02-22 MED ORDER — ROSUVASTATIN CALCIUM 40 MG PO TABS: 40.0000 mg | ORAL_TABLET | Freq: Every day | ORAL | 1 refills | Status: AC
